# Patient Record
Sex: Female | Born: 1935
Health system: Southern US, Community
[De-identification: ages and names within clinical notes are randomized; demographics above are authoritative.]

## PROBLEM LIST (undated history)

## (undated) DIAGNOSIS — E039 Hypothyroidism, unspecified: Secondary | ICD-10-CM

## (undated) DIAGNOSIS — M1990 Unspecified osteoarthritis, unspecified site: Secondary | ICD-10-CM

## (undated) DIAGNOSIS — I209 Angina pectoris, unspecified: Secondary | ICD-10-CM

## (undated) DIAGNOSIS — E785 Hyperlipidemia, unspecified: Secondary | ICD-10-CM

## (undated) DIAGNOSIS — I2510 Atherosclerotic heart disease of native coronary artery without angina pectoris: Secondary | ICD-10-CM

## (undated) DIAGNOSIS — H409 Unspecified glaucoma: Secondary | ICD-10-CM

## (undated) HISTORY — PX: INNER EAR SURGERY: SHX679

## (undated) HISTORY — PX: CARDIAC CATHETERIZATION: SHX172

## (undated) HISTORY — PX: DENTAL SURGERY: SHX609

---

## 1969-07-30 HISTORY — PX: CATARACT EXTRACTION W/ INTRAOCULAR LENS  IMPLANT, BILATERAL: SHX1307

## 1969-07-30 HISTORY — PX: REDUCTION MAMMAPLASTY: SUR839

## 1979-07-31 HISTORY — PX: LUMBAR DISC SURGERY: SHX700

## 2003-11-26 ENCOUNTER — Encounter: Admission: RE | Admit: 2003-11-26 | Discharge: 2003-11-26 | Payer: Self-pay | Admitting: Emergency Medicine

## 2012-09-27 ENCOUNTER — Inpatient Hospital Stay (HOSPITAL_COMMUNITY)
Admission: EM | Admit: 2012-09-27 | Discharge: 2012-09-29 | DRG: 247 | Disposition: A | Discharge status: Home / Self Care | Payer: Medicare Other | Attending: Cardiology | Admitting: Cardiology

## 2012-09-27 ENCOUNTER — Encounter (HOSPITAL_COMMUNITY): Payer: Self-pay | Admitting: Emergency Medicine

## 2012-09-27 ENCOUNTER — Emergency Department (HOSPITAL_COMMUNITY): Payer: Medicare Other

## 2012-09-27 DIAGNOSIS — Z9861 Coronary angioplasty status: Secondary | ICD-10-CM

## 2012-09-27 DIAGNOSIS — E785 Hyperlipidemia, unspecified: Secondary | ICD-10-CM

## 2012-09-27 DIAGNOSIS — Z955 Presence of coronary angioplasty implant and graft: Secondary | ICD-10-CM

## 2012-09-27 DIAGNOSIS — I200 Unstable angina: Secondary | ICD-10-CM

## 2012-09-27 DIAGNOSIS — E039 Hypothyroidism, unspecified: Secondary | ICD-10-CM

## 2012-09-27 DIAGNOSIS — I10 Essential (primary) hypertension: Secondary | ICD-10-CM | POA: Diagnosis present

## 2012-09-27 DIAGNOSIS — R9431 Abnormal electrocardiogram [ECG] [EKG]: Secondary | ICD-10-CM

## 2012-09-27 DIAGNOSIS — I2510 Atherosclerotic heart disease of native coronary artery without angina pectoris: Principal | ICD-10-CM

## 2012-09-27 DIAGNOSIS — E876 Hypokalemia: Secondary | ICD-10-CM

## 2012-09-27 HISTORY — DX: Angina pectoris, unspecified: I20.9

## 2012-09-27 HISTORY — DX: Atherosclerotic heart disease of native coronary artery without angina pectoris: I25.10

## 2012-09-27 HISTORY — DX: Hypothyroidism, unspecified: E03.9

## 2012-09-27 HISTORY — DX: Unspecified glaucoma: H40.9

## 2012-09-27 HISTORY — DX: Unspecified osteoarthritis, unspecified site: M19.90

## 2012-09-27 HISTORY — DX: Hyperlipidemia, unspecified: E78.5

## 2012-09-27 LAB — CBC
HCT: 43 % (ref 36.0–46.0)
Hemoglobin: 14.8 g/dL (ref 12.0–15.0)
MCH: 30.4 pg (ref 26.0–34.0)
MCHC: 34.4 g/dL (ref 30.0–36.0)
MCV: 88.3 fL (ref 78.0–100.0)
Platelets: 310 10*3/uL (ref 150–400)
RBC: 4.87 MIL/uL (ref 3.87–5.11)
RDW: 13.2 % (ref 11.5–15.5)
WBC: 5.4 10*3/uL (ref 4.0–10.5)

## 2012-09-27 LAB — BASIC METABOLIC PANEL
BUN: 16 mg/dL (ref 6–23)
CO2: 28 mEq/L (ref 19–32)
Calcium: 9.8 mg/dL (ref 8.4–10.5)
Chloride: 101 mEq/L (ref 96–112)
Creatinine, Ser: 0.63 mg/dL (ref 0.50–1.10)
GFR calc Af Amer: >90 mL/min (ref >90)
GFR calc non Af Amer: 84 mL/min — ABNORMAL LOW (ref >90)
Glucose, Bld: 103 mg/dL — ABNORMAL HIGH (ref 70–99)
Potassium: 3.4 mEq/L — ABNORMAL LOW (ref 3.5–5.1)
Sodium: 140 mEq/L (ref 135–145)

## 2012-09-27 LAB — POCT I-STAT TROPONIN I: Troponin i, poc: 0.02 ng/mL (ref 0.00–0.08)

## 2012-09-27 LAB — APTT: aPTT: 30 seconds (ref 24–37)

## 2012-09-27 LAB — PROTIME-INR
INR: 0.96 (ref 0.00–1.49)
Prothrombin Time: 12.7 seconds (ref 11.6–15.2)

## 2012-09-27 MED ORDER — HEPARIN SODIUM (PORCINE) 5000 UNIT/ML IJ SOLN: 60.0000 [IU]/kg | Freq: Once | INTRAMUSCULAR | Status: DC

## 2012-09-27 MED ORDER — ASPIRIN 81 MG PO CHEW
CHEWABLE_TABLET | ORAL | Status: AC
  Administered 2012-09-27: 81 mg via ORAL
  Filled 2012-09-27: qty 4

## 2012-09-27 MED ORDER — HEPARIN (PORCINE) IN NACL 100-0.45 UNIT/ML-% IJ SOLN
950.0000 [IU]/h | INTRAMUSCULAR | Status: DC
  Administered 2012-09-27: 950 [IU]/h via INTRAVENOUS
  Filled 2012-09-27 (×2): qty 250

## 2012-09-27 MED ORDER — NITROGLYCERIN 0.4 MG SL SUBL
0.4000 mg | SUBLINGUAL_TABLET | SUBLINGUAL | Status: DC | PRN
  Administered 2012-09-27: 0.4 mg via SUBLINGUAL
  Filled 2012-09-27: qty 25

## 2012-09-27 MED ORDER — ASPIRIN 81 MG PO CHEW
324.0000 mg | CHEWABLE_TABLET | Freq: Once | ORAL | Status: AC
  Administered 2012-09-27: 81 mg via ORAL

## 2012-09-27 MED ORDER — HEPARIN BOLUS VIA INFUSION
4000.0000 [IU] | Freq: Once | INTRAVENOUS | Status: AC
  Administered 2012-09-27: 4000 [IU] via INTRAVENOUS

## 2012-09-27 MED ORDER — HEPARIN (PORCINE) IN NACL 100-0.45 UNIT/ML-% IJ SOLN: 1000.0000 [IU]/h | Freq: Once | INTRAMUSCULAR | Status: DC

## 2012-09-27 MED ORDER — ASPIRIN 325 MG PO TABS: 325.0000 mg | ORAL_TABLET | ORAL | Status: DC

## 2012-09-27 NOTE — ED Provider Notes (Signed)
History    77 year old female with chest pain. Intermittent for the past month. Substernal. Starts as sharp pain that transitions into a dull ache and slowly subsides.  Symptoms last anywhere from a few minutes to up to approximately 20 minutes. Sometimes associated with nausea and SOB. Sometimes radiation to her arms and her neck. Episodes becoming more frequent. Episode just prior to arrival lasted much longer than it has previously which is why she came to the ED.  Received nitro and ASA shortly after arrival and symptoms completely resolved prior to my exam. Patient states history of similar symptoms approximately 20 years ago and she subsequently had coronary stenting. Care was in [place] and she does not have a local cardiologist. No fevers or chills. No cough. No unusual leg pain or swelling. No smoking hx. Hx of HTN and HLD.  CSN: [id number]  Arrival date & time 09/27/12  1839   First MD Initiated Contact with Patient 09/27/12 2041      Chief Complaint  Patient presents with  . Chest Pain  . Numbness    arms    (Consider location/radiation/quality/duration/timing/severity/associated sxs/prior treatment) HPI  Past Medical History  Diagnosis Date  . Thyroid disease   . Glaucoma     Past Surgical History  Procedure Date  . Coronary angioplasty with stent placement     No family history on file.  History  Substance Use Topics  . Smoking status: Never Smoker   . Smokeless tobacco: Not on file  . Alcohol Use: No    OB History    Grav Para Term Preterm Abortions TAB SAB Ect Mult Living                  Review of Systems   Review of symptoms negative unless otherwise noted in HPI.   Allergies  Morphine and related and Penicillins  Home Medications   Current Outpatient Rx  Name Route Sig Dispense Refill  . DORZOLAMIDE HCL-TIMOLOL MAL 22.3-6.8 MG/ML OP SOLN Both Eyes Place 1 drop into both eyes 2 (two) times daily.    Marland Kitchen LEVOTHYROXINE SODIUM 125 MCG PO  TABS Oral Take 125 mcg by mouth daily.    Marland Kitchen PRAVASTATIN SODIUM 40 MG PO TABS Oral Take 40 mg by mouth daily.      BP 149/48  Pulse 80  Temp 97.6 F (36.4 C) (Oral)  Resp 14  Ht 5\' 4"  (1.626 m)  Wt 179 lb (81.194 kg)  BMI 30.73 kg/m2  SpO2 95%  Physical Exam  Nursing note and vitals reviewed. Constitutional: She appears well-developed and well-nourished. No distress.       Sitting up in bed. NAD.  HENT:  Head: Normocephalic and atraumatic.  Eyes: Conjunctivae normal are normal. Right eye exhibits no discharge. Left eye exhibits no discharge.  Neck: Neck supple.  Cardiovascular: Normal rate, regular rhythm and normal heart sounds.  Exam reveals no gallop and no friction rub.   No murmur heard. Pulmonary/Chest: Effort normal and breath sounds normal. No respiratory distress. She exhibits no tenderness.       CP not reproducible  Abdominal: Soft. She exhibits no distension. There is no tenderness.  Musculoskeletal: She exhibits no edema and no tenderness.       Lower extremities symmetric as compared to each other. No calf tenderness. Negative Homan's. No palpable cords.   Neurological: She is alert.  Skin: Skin is warm and dry. She is not diaphoretic.  Psychiatric: She has a normal mood and affect. Her behavior  is normal. Thought content normal.    ED Course  Procedures (including critical care time)  CRITICAL CARE Performed by: Raeford Razor   Total critical care time: 30 minutes  Critical care time was exclusive of separately billable procedures and treating other patients.  Critical care was necessary to treat or prevent imminent or life-threatening deterioration.  Critical care was time spent personally by me on the following activities: development of treatment plan with patient and/or surrogate as well as nursing, discussions with consultants, evaluation of patient's response to treatment, examination of patient, obtaining history from patient or surrogate,  ordering and performing treatments and interventions, ordering and review of laboratory studies, ordering and review of radiographic studies, pulse oximetry and re-evaluation of patient's condition.   Labs Reviewed  BASIC METABOLIC PANEL - Abnormal; Notable for the following:    Potassium 3.4 (*)     Glucose, Bld 103 (*)     GFR calc non Af Amer 84 (*)     All other components within normal limits  CBC  POCT I-STAT TROPONIN I   Dg Chest 2 View  09/27/2012  *RADIOLOGY REPORT*  Clinical Data: Chest pain.  Weakness.  CHEST - 2 VIEW  Comparison: None.  Findings: Artifact overlies the chest.  Heart size is normal. Mediastinal shadows are normal.  The lungs are clear.  No effusions.  No bony abnormalities.  IMPRESSION: No active disease.   Original Report Authenticated By: Thomasenia Sales, M.D.    EKG:  Rhythm: normal sinus. artifact I ,III, aVL Vent. rate 82 BPM PR interval 148 ms QRS duration 80 ms QT/QTc 372/434 ms Axis: normal ST segments: ST depression V3-V6 and inferior leads Comparison: none  EKG: repeat Rhythm: normal sinus Vent. rate 72 BPM PR interval 160 ms QRS duration 76 ms QT/QTc 436/477 ms Axis: normal ST segments: TWI lateral precordial leads. t wave flattening laterally Comparison: interval resolution of ST depression   1. Unstable angina   2. Abnormal EKG       MDM  78 year old female with chest pain.  EKG with dynamic changes. Initial EKG on presentation with some ST depression laterally. Repeat EKG shows resolution of this and now T wave inversions in the lateral precordial leads. Patient has a known history of CAD status post stenting proximally 20 years ago. Presentation concerning for unstable angina. Received ASA.  Heparin gtt. Cardiology was consulted.        Raeford Razor, MD 09/27/12 2329

## 2012-09-27 NOTE — ED Notes (Signed)
Pt states that she has had chest pain x 1 month.  Has gotten worse.  Also c/o numb arms.  States that when her chest hurts, it is a sharp pain, she has SOB and dizziness.  Pt a&o x 4. BP 212/67 in triage.

## 2012-09-27 NOTE — H&P (Signed)
Cardiology History and Physical  No primary provider on file.  History of Present Illness (and review of medical records): Sabrina Hart is a 78 y.o. female who presents for evaluation of chest pain.  She does not have a cardiologist locally, but does have hx of CAD with previous PCI 15-70yrs ago in [place].  She has been here in [place] for over 42yrs.  She reports for past month she has noticed chest pain with minimal exertion.  Chest pain is midsternal and associated with numbness in arms bilaterally, neck tightness, shortness of breath, diaphoresis and nausea.  Symptoms have not necessarily progressed overtime, however, today she had severe episode that was not relieved with rest.  She was brought to ED by family and pain was resolved after ASA and Nitro.  She was started on Heparin gtt.  She remains chest pain free at the time of my evaluation.  Previous diagnostic testing for coronary artery disease includes: cardiac catheterization. Previous history of cardiac disease includes Coronary Artery Disease Coronary Artery Stent. Coronary artery disease risk factors include: advanced age (older than 62 for men, 26 for women) and dyslipidemia. Patient denies history of CABG and cardiomyopathy.  Review of Systems Further review of systems was otherwise negative other than stated in HPI.  Patient Active Problem List   Diagnosis Date Noted  . Unstable angina 09/27/2012   Past Medical History  Diagnosis Date  . Thyroid disease   . Glaucoma     Past Surgical History  Procedure Date  . Coronary angioplasty with stent placement      (Not in a hospital admission) Allergies  Allergen Reactions  . Morphine And Related Rash  . Penicillins Rash    History  Substance Use Topics  . Smoking status: Never Smoker   . Smokeless tobacco: Not on file  . Alcohol Use: No    No family history on file.   Objective: Patient Vitals for the past 8 hrs:  BP Temp Temp src Pulse Resp SpO2 Height Weight    09/27/12 2156 - - - - - - 5\' 4"  (1.626 m) 81.194 kg (179 lb)  09/27/12 2130 149/48 mmHg - - - 14  - - -  09/27/12 2100 160/54 mmHg - - 80  18  95 % - -  09/27/12 2030 141/57 mmHg - - 67  14  96 % - -  09/27/12 1926 159/61 mmHg - - 70  - 100 % - -  09/27/12 1905 190/64 mmHg - - 90  14  99 % - -  09/27/12 1857 - 97.6 F (36.4 C) Oral - - - - -  09/27/12 1852 212/67 mmHg - - 72  - 100 % - -   General Appearance:    Alert, cooperative, no distress, appears stated age  Head:    Normocephalic, without obvious abnormality, atraumatic  Eyes:     PERRL, EOMI, anicteric sclerae  Neck:   Supple, no carotid bruit or JVD  Lungs:     Clear to auscultation bilaterally, respirations unlabored  Heart:    Regular rate and rhythm, S1 and S2 normal, no murmur  Abdomen:     Soft, non-tender, normoactive bowel sounds  Extremities:   Extremities normal, atraumatic, BLE edema  Pulses:   2+ and symmetric all extremities  Skin:   no rashes or lesions  Neurologic:   No focal deficits. AAO x3   Results for orders placed during the hospital encounter of 09/27/12 (from the past 48 hour(s))  CBC  Status: Normal   Collection Time   09/27/12  7:26 PM      Component Value Range Comment   WBC 5.4  4.0 - 10.5 K/uL    RBC 4.87  3.87 - 5.11 MIL/uL    Hemoglobin 14.8  12.0 - 15.0 g/dL    HCT 16.1  09.6 - 04.5 %    MCV 88.3  78.0 - 100.0 fL    MCH 30.4  26.0 - 34.0 pg    MCHC 34.4  30.0 - 36.0 g/dL    RDW 40.9  81.1 - 91.4 %    Platelets 310  150 - 400 K/uL   BASIC METABOLIC PANEL     Status: Abnormal   Collection Time   09/27/12  7:26 PM      Component Value Range Comment   Sodium 140  135 - 145 mEq/L    Potassium 3.4 (*) 3.5 - 5.1 mEq/L    Chloride 101  96 - 112 mEq/L    CO2 28  19 - 32 mEq/L    Glucose, Bld 103 (*) 70 - 99 mg/dL    BUN 16  6 - 23 mg/dL    Creatinine, Ser 7.82  0.50 - 1.10 mg/dL    Calcium 9.8  8.4 - 95.6 mg/dL    GFR calc non Af Amer 84 (*) >90 mL/min    GFR calc Af Amer >90  >90  mL/min   POCT I-STAT TROPONIN I     Status: Normal   Collection Time   09/27/12  7:31 PM      Component Value Range Comment   Troponin i, poc 0.02  0.00 - 0.08 ng/mL    Comment 3             Dg Chest 2 View  09/27/2012  *RADIOLOGY REPORT*  Clinical Data: Chest pain.  Weakness.  CHEST - 2 VIEW  Comparison: None.  Findings: Artifact overlies the chest.  Heart size is normal. Mediastinal shadows are normal.  The lungs are clear.  No effusions.  No bony abnormalities.  IMPRESSION: No active disease.   Original Report Authenticated By: Thomasenia Sales, M.D.     ECG:  Initial ecg with baseline artifact, sinus rhythm HR 82, diffuse ST depression anterolateral and inferior, mild elevation in AVR, repeat much improved with TWI v4-v6. None prior to compare.  Assessment: 19F with hx of CAD s/p remote PCI to unknown vessel, HLD presents with symptoms concerning for ACS/unstable angina.  Plan:  1. Admit to Cardiology.  Will transfer from [facility] to stepdown at [facility]. 2. Continuous monitoring on Telemetry. 3. Repeat ekg on admit, prn chest pain or arrythmia 4. Trend cardiac biomarkers, check lipids, hgba1c, tsh, bnp 5. Medical management to include ASA, Heparin, BB, Statin, NTG prn 6. Hold Plavix due to concern for possible multivessel disease.

## 2012-09-27 NOTE — Progress Notes (Signed)
ANTICOAGULATION CONSULT NOTE - Initial Consult  Pharmacy Consult for Heparin Indication: chest pain/ACS  Allergies  Allergen Reactions  . Morphine And Related Rash  . Penicillins Rash    Patient Measurements: Height: 5\' 4"  (162.6 cm) Weight: 179 lb (81.194 kg) IBW/kg (Calculated) : 54.7  Heparin Dosing Weight:   Vital Signs: Temp: 97.6 F (36.4 C) (10/30 1857) Temp src: Oral (10/30 1857) BP: 149/48 mmHg (10/30 2130) Pulse Rate: 80  (10/30 2100)  Labs:  Basename 09/27/12 1926  HGB 14.8  HCT 43.0  PLT 310  APTT --  LABPROT --  INR --  HEPARINUNFRC --  CREATININE 0.63  CKTOTAL --  CKMB --  TROPONINI --    Estimated Creatinine Clearance: 59.7 ml/min (by C-G formula based on Cr of 0.63).   Medical History: Past Medical History  Diagnosis Date  . Thyroid disease   . Glaucoma     Medications:  Scheduled:    . aspirin  324 mg Oral Once  . heparin  1,000 Units/hr Intravenous Once  . heparin  60 Units/kg Intravenous Once  . DISCONTD: aspirin  325 mg Oral STAT   Infusions:    Assessment:  80 YOF to ED on 10/30 with c/o chest pain.  Pt has hx of CAD s/p stenting.  Baseline coag labs pending  CBC wnl  First troponin 0.02  Goal of Therapy:  Heparin level 0.3-0.7 units/ml Monitor platelets by anticoagulation protocol: Yes   Plan:   Baseline PTT, PT/INR  Give heparin 4000 units bolus IV x 1  Start heparin IV infusion at 950 units/hr  Heparin level 8 hours after starting  Daily heparin level and CBC  Continue to monitor H&H and platelets  Lynann Beaver PharmD, BCPS Pager [phone] 09/27/2012 10:14 PM

## 2012-09-27 NOTE — ED Notes (Signed)
MD at bedside. 

## 2012-09-27 NOTE — ED Notes (Signed)
Cardiology MD at bedside.

## 2012-09-28 ENCOUNTER — Encounter (HOSPITAL_COMMUNITY)
Admission: EM | Disposition: A | Discharge status: Home / Self Care | Payer: Self-pay | Source: Home / Self Care | Attending: Cardiology

## 2012-09-28 ENCOUNTER — Encounter (HOSPITAL_COMMUNITY): Payer: Self-pay | Admitting: *Deleted

## 2012-09-28 DIAGNOSIS — I2510 Atherosclerotic heart disease of native coronary artery without angina pectoris: Secondary | ICD-10-CM

## 2012-09-28 HISTORY — PX: PERCUTANEOUS CORONARY STENT INTERVENTION (PCI-S): SHX5485

## 2012-09-28 HISTORY — PX: LEFT HEART CATHETERIZATION WITH CORONARY ANGIOGRAM: SHX5451

## 2012-09-28 HISTORY — PX: CORONARY ANGIOPLASTY WITH STENT PLACEMENT: SHX49

## 2012-09-28 LAB — LIPID PANEL
Cholesterol: 179 mg/dL (ref 0–200)
HDL: 68 mg/dL (ref >39)
LDL Cholesterol: 99 mg/dL (ref 0–99)
Total CHOL/HDL Ratio: 2.6 RATIO
Triglycerides: 62 mg/dL (ref <150)
VLDL: 12 mg/dL (ref 0–40)

## 2012-09-28 LAB — COMPREHENSIVE METABOLIC PANEL
ALT: 9 U/L (ref 0–35)
Alkaline Phosphatase: 71 U/L (ref 39–117)
CO2: 24 mEq/L (ref 19–32)
GFR calc Af Amer: >90 mL/min (ref >90)
GFR calc non Af Amer: 87 mL/min — ABNORMAL LOW (ref >90)
Glucose, Bld: 90 mg/dL (ref 70–99)
Potassium: 4.2 mEq/L (ref 3.5–5.1)
Sodium: 138 mEq/L (ref 135–145)
Total Protein: 6.1 g/dL (ref 6.0–8.3)

## 2012-09-28 LAB — MRSA PCR SCREENING: MRSA by PCR: NEGATIVE

## 2012-09-28 LAB — CBC
HCT: 35.9 % — ABNORMAL LOW (ref 36.0–46.0)
Hemoglobin: 12.2 g/dL (ref 12.0–15.0)
MCH: 30.3 pg (ref 26.0–34.0)
MCHC: 34 g/dL (ref 30.0–36.0)
MCV: 89.1 fL (ref 78.0–100.0)
Platelets: 223 10*3/uL (ref 150–400)
RBC: 4.03 MIL/uL (ref 3.87–5.11)
RDW: 13.3 % (ref 11.5–15.5)
WBC: 4.3 10*3/uL (ref 4.0–10.5)

## 2012-09-28 LAB — CK TOTAL AND CKMB (NOT AT ARMC)
CK, MB: 2.5 ng/mL (ref 0.3–4.0)
CK, MB: 2.4 ng/mL (ref 0.3–4.0)
Relative Index: 1.5 (ref 0.0–2.5)
Relative Index: 1.3 (ref 0.0–2.5)

## 2012-09-28 LAB — BASIC METABOLIC PANEL
BUN: 15 mg/dL (ref 6–23)
CO2: 25 mEq/L (ref 19–32)
Calcium: 8.9 mg/dL (ref 8.4–10.5)
Chloride: 106 mEq/L (ref 96–112)
Creatinine, Ser: 0.68 mg/dL (ref 0.50–1.10)
GFR calc Af Amer: >90 mL/min (ref >90)
GFR calc non Af Amer: 82 mL/min — ABNORMAL LOW (ref >90)
Glucose, Bld: 94 mg/dL (ref 70–99)
Potassium: 3.4 mEq/L — ABNORMAL LOW (ref 3.5–5.1)
Sodium: 141 mEq/L (ref 135–145)

## 2012-09-28 LAB — HEMOGLOBIN A1C
Hgb A1c MFr Bld: 5.6 % (ref <5.7)
Mean Plasma Glucose: 114 mg/dL (ref <117)

## 2012-09-28 LAB — POCT ACTIVATED CLOTTING TIME: Activated Clotting Time: 404 seconds

## 2012-09-28 LAB — PRO B NATRIURETIC PEPTIDE: Pro B Natriuretic peptide (BNP): 242.8 pg/mL (ref 0–450)

## 2012-09-28 LAB — PROTIME-INR
INR: 1.11 (ref 0.00–1.49)
Prothrombin Time: 14.2 seconds (ref 11.6–15.2)

## 2012-09-28 LAB — TROPONIN I: Troponin I: <0.3 ng/mL (ref <0.30)

## 2012-09-28 SURGERY — LEFT HEART CATHETERIZATION WITH CORONARY ANGIOGRAM
Anesthesia: LOCAL

## 2012-09-28 MED ORDER — METOPROLOL TARTRATE 1 MG/ML IV SOLN: 2.5000 mg | INTRAVENOUS | Status: DC | PRN

## 2012-09-28 MED ORDER — METOPROLOL TARTRATE 25 MG PO TABS
25.0000 mg | ORAL_TABLET | Freq: Two times a day (BID) | ORAL | Status: DC
  Administered 2012-09-28 (×2): 25 mg via ORAL
  Filled 2012-09-28 (×4): qty 1

## 2012-09-28 MED ORDER — MIDAZOLAM HCL 2 MG/2ML IJ SOLN
INTRAMUSCULAR | Status: AC
  Filled 2012-09-28: qty 2

## 2012-09-28 MED ORDER — CEFAZOLIN SODIUM 1-5 GM-% IV SOLN
INTRAVENOUS | Status: AC
  Filled 2012-09-28: qty 50

## 2012-09-28 MED ORDER — FENTANYL CITRATE 0.05 MG/ML IJ SOLN
INTRAMUSCULAR | Status: AC
  Filled 2012-09-28: qty 2

## 2012-09-28 MED ORDER — ONDANSETRON HCL 4 MG/2ML IJ SOLN: 4.0000 mg | Freq: Four times a day (QID) | INTRAMUSCULAR | Status: DC | PRN

## 2012-09-28 MED ORDER — LIDOCAINE HCL (PF) 1 % IJ SOLN
INTRAMUSCULAR | Status: AC
  Filled 2012-09-28: qty 30

## 2012-09-28 MED ORDER — NON FORMULARY: 90.0000 mg | Freq: Once | Status: DC

## 2012-09-28 MED ORDER — ASPIRIN EC 81 MG PO TBEC: 81.0000 mg | DELAYED_RELEASE_TABLET | Freq: Every day | ORAL | Status: DC

## 2012-09-28 MED ORDER — ACETAMINOPHEN 325 MG PO TABS: 650.0000 mg | ORAL_TABLET | ORAL | Status: DC | PRN

## 2012-09-28 MED ORDER — HEPARIN (PORCINE) IN NACL 2-0.9 UNIT/ML-% IJ SOLN
INTRAMUSCULAR | Status: AC
  Filled 2012-09-28: qty 1000

## 2012-09-28 MED ORDER — LIDOCAINE-EPINEPHRINE 1 %-1:100000 IJ SOLN
INTRAMUSCULAR | Status: AC
  Filled 2012-09-28: qty 1

## 2012-09-28 MED ORDER — DORZOLAMIDE HCL-TIMOLOL MAL 2-0.5 % OP SOLN
1.0000 [drp] | Freq: Two times a day (BID) | OPHTHALMIC | Status: DC
  Administered 2012-09-28 – 2012-09-29 (×3): 1 [drp] via OPHTHALMIC
  Filled 2012-09-28: qty 10

## 2012-09-28 MED ORDER — NITROGLYCERIN 0.2 MG/ML ON CALL CATH LAB
INTRAVENOUS | Status: AC
  Filled 2012-09-28: qty 1

## 2012-09-28 MED ORDER — ASPIRIN 81 MG PO CHEW
324.0000 mg | CHEWABLE_TABLET | ORAL | Status: DC
  Filled 2012-09-28: qty 4

## 2012-09-28 MED ORDER — SODIUM CHLORIDE 0.9 % IV SOLN: INTRAVENOUS | Status: AC

## 2012-09-28 MED ORDER — ASPIRIN 300 MG RE SUPP
300.0000 mg | RECTAL | Status: DC
  Filled 2012-09-28: qty 1

## 2012-09-28 MED ORDER — POTASSIUM CHLORIDE CRYS ER 20 MEQ PO TBCR
40.0000 meq | EXTENDED_RELEASE_TABLET | Freq: Once | ORAL | Status: AC
  Administered 2012-09-28: 40 meq via ORAL
  Filled 2012-09-28: qty 2

## 2012-09-28 MED ORDER — HEPARIN SODIUM (PORCINE) 1000 UNIT/ML IJ SOLN
INTRAMUSCULAR | Status: AC
  Filled 2012-09-28: qty 1

## 2012-09-28 MED ORDER — LEVOTHYROXINE SODIUM 125 MCG PO TABS
125.0000 ug | ORAL_TABLET | Freq: Every day | ORAL | Status: DC
  Administered 2012-09-28 – 2012-09-29 (×2): 125 ug via ORAL
  Filled 2012-09-28 (×3): qty 1

## 2012-09-28 MED ORDER — SODIUM CHLORIDE 0.9 % IV SOLN: 250.0000 mL | INTRAVENOUS | Status: DC

## 2012-09-28 MED ORDER — SIMVASTATIN 40 MG PO TABS
40.0000 mg | ORAL_TABLET | Freq: Every day | ORAL | Status: DC
  Administered 2012-09-28: 40 mg via ORAL
  Filled 2012-09-28 (×2): qty 1

## 2012-09-28 MED ORDER — SODIUM CHLORIDE 0.9 % IJ SOLN: 3.0000 mL | Freq: Two times a day (BID) | INTRAMUSCULAR | Status: DC

## 2012-09-28 MED ORDER — SODIUM CHLORIDE 0.9 % IJ SOLN: 3.0000 mL | INTRAMUSCULAR | Status: DC | PRN

## 2012-09-28 MED ORDER — TICAGRELOR 90 MG PO TABS
90.0000 mg | ORAL_TABLET | Freq: Two times a day (BID) | ORAL | Status: DC
  Filled 2012-09-28 (×2): qty 1

## 2012-09-28 MED ORDER — BIOTENE DRY MOUTH MT LIQD
15.0000 mL | Freq: Two times a day (BID) | OROMUCOSAL | Status: DC
  Administered 2012-09-28 – 2012-09-29 (×2): 15 mL via OROMUCOSAL

## 2012-09-28 MED ORDER — ASPIRIN 81 MG PO CHEW: 81.0000 mg | CHEWABLE_TABLET | Freq: Every day | ORAL | Status: DC

## 2012-09-28 MED ORDER — TICAGRELOR 90 MG PO TABS
90.0000 mg | ORAL_TABLET | Freq: Once | ORAL | Status: AC
  Administered 2012-09-29: 90 mg via ORAL
  Filled 2012-09-28: qty 1

## 2012-09-28 MED ORDER — DIAZEPAM 5 MG PO TABS
5.0000 mg | ORAL_TABLET | ORAL | Status: AC
  Administered 2012-09-28: 5 mg via ORAL
  Filled 2012-09-28: qty 1

## 2012-09-28 MED ORDER — BIVALIRUDIN 250 MG IV SOLR
INTRAVENOUS | Status: AC
  Filled 2012-09-28: qty 250

## 2012-09-28 MED ORDER — SODIUM CHLORIDE 0.9 % IV SOLN
1.0000 mL/kg/h | INTRAVENOUS | Status: DC
  Administered 2012-09-28: 07:00:00 1 mL/kg/h via INTRAVENOUS

## 2012-09-28 MED ORDER — SODIUM CHLORIDE 0.9 % IV SOLN: 1.0000 mL/kg/h | INTRAVENOUS | Status: AC

## 2012-09-28 NOTE — CV Procedure (Signed)
    Cardiac Cath Note  Sabrina Hart [id number] 08/10/1932  Procedure: left  Heart Cardiac Catheterization Note Indications: Chest pain  Procedure Details Consent: Obtained Time Out: Verified patient identification, verified procedure, site/side was marked, verified correct patient position, special equipment/implants available, Radiology Safety Procedures followed,  medications/allergies/relevent history reviewed, required imaging and test results available.  Performed   Medications: Fentanyl: 50 mcg IV Versed: 2 mg IV  The right femoral artery was easily canulated using a modified Seldinger technique.  Hemodynamics:   LV pressure: 118/12 Aortic pressure: 115/55  Angiography   Left Main:  JL3.5 used to canulate the LM.  Small and normal  Left anterior Descending:  Moderate diffuse disease.  20-30% stenosis throughout.  The 1st diag is a large vessel with mild - moderate diffuse disease.  There is a focal 70% stenosis in the distal 1st diag.  Left Circumflex: LCx - moderate size vessel with mild diffuse disease.  1st OM is moderate and has a focal 50-60% stenosis  Right Coronary Artery:  Large and dominant , diffuse disease.  There is a mid 60% stenosis and a mid/distal 90% stenosis.  The PDA and PLSA have mild irregularities.  LV Gram: hyperdynamic LV systolic function.  EF 75-80%  Complications: No apparent complications Patient did tolerate procedure well.  Contrast used:  70CC  Conclusions:   1. Diffuse CAD with a tight stenosis in the mid/distal RCA.   We will proceed with stenting later today.  I have reviewed the angiograms with Dr. Excell Seltzer.  2. Hyperdynamic LV function   [name], [name]., MD, [facility] 09/28/2012, 12:07 PM Office - [phone] Pager [phone]

## 2012-09-28 NOTE — Research (Signed)
[facility] Ad Hoc PCI Research Study Informed Consent   Subject Name: Sabrina Hart  Subject met inclusion and exclusion criteria.  The informed consent form, study requirements and expectations were reviewed with the subject and questions and concerns were addressed prior to the signing of the consent form.  The subject verbalized understanding of the trial requirements.  The subject agreed to participate in the [facility] Ad Hoc PCI Research Study and signed the informed consent.  The informed consent was obtained prior to performance of any protocol-specific procedures for the subject.  A copy of the signed informed consent was given to the subject and a copy was placed in the subject's medical record.  Claire Shown 09/28/2012, 4:11 PM

## 2012-09-28 NOTE — Interval H&P Note (Signed)
History and Physical Interval Note:  09/28/2012 2:53 PM  Sabrina Hart  has presented today for surgery, with the diagnosis of cp  The various methods of treatment have been discussed with the patient and family. After consideration of risks, benefits and other options for treatment, the patient has consented to  Procedure(s) (LRB) with comments: LEFT HEART CATHETERIZATION WITH CORONARY ANGIOGRAM (N/A) PERCUTANEOUS CORONARY STENT INTERVENTION (PCI-S) () as a surgical intervention .  The patient's history has been reviewed, patient examined, no change in status, stable for surgery.  I have reviewed the patient's chart and labs.  Questions were answered to the patient's satisfaction.    Diagnostic cath shows severe RCA stenosis. Plan PCI today. Pt enrolled to ad-hoc brilinta PCI study.   Tonny Bollman

## 2012-09-28 NOTE — Interval H&P Note (Signed)
History and Physical Interval Note:  09/28/2012 11:30 AM  Sabrina Hart  has presented today for surgery, with the diagnosis of cp  The various methods of treatment have been discussed with the patient and family. After consideration of risks, benefits and other options for treatment, the patient has consented to  Procedure(s) (LRB) with comments: LEFT HEART CATHETERIZATION WITH CORONARY ANGIOGRAM (N/A) as a surgical intervention .  The patient's history has been reviewed, patient examined, no change in status, stable for surgery.  I have reviewed the patient's chart and labs.  Questions were answered to the patient's satisfaction.     Elyn Aquas.

## 2012-09-28 NOTE — Progress Notes (Signed)
ANTICOAGULATION CONSULT NOTE - Follow Up Consult  Pharmacy Consult for Heparin Indication: chest pain/ACS  Allergies  Allergen Reactions  . Latex   . Other     metals  . Pollen Extract   . Morphine And Related Rash  . Penicillins Rash    Patient Measurements: Height: 5\' 4"  (162.6 cm) Weight: 174 lb 2.6 oz (79 kg) IBW/kg (Calculated) : 54.7  Heparin Dosing Weight: 71.56 kg  Vital Signs: Temp: 97.8 F (36.6 C) (10/31 0530) Temp src: Oral (10/31 0141) BP: 147/60 mmHg (10/31 0530) Pulse Rate: 63  (10/31 0100)  Labs:  Basename 09/28/12 0836 09/28/12 0555 09/28/12 0010 09/27/12 2217 09/27/12 1926  HGB -- 12.2 -- -- 14.8  HCT -- 35.9* -- -- 43.0  PLT -- 223 -- -- 310  APTT -- -- -- 30 --  LABPROT -- 14.2 -- 12.7 --  INR -- 1.11 -- 0.96 --  HEPARINUNFRC 0.57 -- -- -- --  CREATININE -- 0.68 -- -- 0.63  CKTOTAL -- -- -- -- --  CKMB -- -- -- -- --  TROPONINI -- <0.30 <0.30 -- --    Estimated Creatinine Clearance: 58.9 ml/min (by C-G formula based on Cr of 0.68).   Medications:  Infusions:    . sodium chloride 75 mL/hr at 09/28/12 0200  . sodium chloride    . heparin 950 Units/hr (09/28/12 0500)    Assessment: 78 y.o. F transferred from [facility] to [facility] for further evaluation of CP. Patient remains on heparin while awaiting cardiac cath (add-on for today). Heparin level this morning is therapeutic (HL 0.57, goal of 0.3-0.7). Hgb/Hct/Plt slight drop -- no s/sx of bleeding noted at this time.  Goal of Therapy:  Heparin level 0.3-0.7 units/ml Monitor platelets by anticoagulation protocol: Yes   Plan:  1. Continue heparin at 950 units/hr (9.5 ml/hr) 2. Will continue to monitor for any signs/symptoms of bleeding and will follow up with heparin level in 8 hours or post-cath  Georgina Pillion, PharmD, BCPS Clinical Pharmacist Pager: [phone] 09/28/2012 10:35 AM

## 2012-09-28 NOTE — Progress Notes (Signed)
Cardiology Progress Note Patient Name: Sabrina Hart Date of Encounter: 09/28/2012, 8:04 AM     Subjective  No overnight events. Denies chest pain or sob.   Objective   Telemetry: sinus rhythm 70s-80s  Medications: . antiseptic oral rinse  15 mL Mouth Rinse BID  . aspirin  324 mg Oral Once  . aspirin  324 mg Oral NOW   Or  . aspirin  300 mg Rectal NOW  . aspirin EC  81 mg Oral Daily  . dorzolamide-timolol  1 drop Both Eyes BID  . heparin  4,000 Units Intravenous Once  . levothyroxine  125 mcg Oral QAC breakfast  . metoprolol tartrate  25 mg Oral BID  . simvastatin  40 mg Oral q1800  . DISCONTD: aspirin  325 mg Oral STAT  . DISCONTD: heparin  1,000 Units/hr Intravenous Once  . DISCONTD: heparin  60 Units/kg Intravenous Once   . sodium chloride 75 mL/hr at 09/28/12 0200  . heparin 950 Units/hr (09/28/12 0500)    Physical Exam: Temp:  [97.6 F (36.4 C)-98.9 F (37.2 C)] 97.8 F (36.6 C) (10/31 0530) Pulse Rate:  [63-90] 63  (10/31 0100) Resp:  [11-21] 11  (10/31 0141) BP: (120-212)/(48-92) 147/60 mmHg (10/31 0530) SpO2:  [95 %-100 %] 100 % (10/31 0141) Weight:  [174 lb 2.6 oz (79 kg)-179 lb (81.194 kg)] 174 lb 2.6 oz (79 kg) (10/31 0141)  General: Pleasant elderly black female, in no acute distress. Head: Normocephalic, atraumatic, sclera non-icteric, nares are without discharge.  Neck: Supple. Negative for carotid bruits or JVD Lungs: Clear bilaterally to auscultation without wheezes, rales, or rhonchi. Breathing is unlabored. Heart: RRR S1 S2 without murmurs, rubs, or gallops.  Abdomen: Soft, non-tender, non-distended with normoactive bowel sounds. No rebound/guarding. No obvious abdominal masses. Msk:  Strength and tone appear normal for age. Extremities: 1+ BLE edema. No clubbing or cyanosis. Distal pedal pulses are intact and equal bilaterally. Neuro: Alert and oriented X 3. Moves all extremities spontaneously. Psych:  Responds to questions appropriately with  a normal affect.   Intake/Output Summary (Last 24 hours) at 09/28/12 0804 Last data filed at 09/28/12 0500  Gross per 24 hour  Intake    338 ml  Output    550 ml  Net   -212 ml    Labs:  [facility] 09/28/12 0555 09/27/12 1926  NA 141 140  K 3.4* 3.4*  CL 106 101  CO2 25 28  GLUCOSE 94 103*  BUN 15 16  CREATININE 0.68 0.63  CALCIUM 8.9 9.8   Basename 09/28/12 0555 09/27/12 1926  WBC 4.3 5.4  HGB 12.2 14.8  HCT 35.9* 43.0  MCV 89.1 88.3  PLT 223 310   Basename 09/28/12 0010  TROPONINI <0.30     09/28/2012 05:55  Prothrombin Time 14.2  INR 1.11     09/28/2012 05:55  Pro B Natriuretic peptide (BNP) 242.8    09/28/2012 05:55  Cholesterol 179  Triglycerides 62  HDL 68  LDL (calc) 99  VLDL 12  Total CHOL/HDL Ratio 2.6    Radiology/Studies:   09/27/2012 -  CHEST - 2 VIEW  Findings: Artifact overlies the chest.  Heart size is normal. Mediastinal shadows are normal.  The lungs are clear.  No effusions.  No bony abnormalities.  IMPRESSION: No active disease.       Assessment and Plan  37F with hx of CAD s/p remote PCI to unknown vessel and HLD presents with symptoms concerning for  ACS/unstable angina.  1. Chest pain concerning for unstable angina 2. Elevated blood pressure 3. Hypokalemia 4. Hyperlipidemia 5. Hypothyroidism  Patient has history of CAD s/p PCI ~15-61yrs ago. Presents with one month of exertional squeezing chest pain that worsened yesterday and was relieved with NTG. She has had occasional rest pain and states it feels similar to the pain she had prior to her stent. Her symptoms are concerning for unstable angina. Troponin normal x1. EKG with inferolateral TWI (no old for comparison). No further chest pain. Cont IV heparin, ASA, BB, statin. Discussed with Dr. Gala Romney who agrees she needs further ischemic evaluation with cath. Discussed risk and benefits with the patient who is agreeable to the procedure. No prior history of hypertension, not on  antihypertensives prior to arrival. BP elevated 140-160s. Suppplement K+. A1c pending. LDL 99. Cont statin and consider increasing dose pending cath results. CMET in am to assess LFTs, Crt and K+. TSH pending. Cont synthroid.    Signed, Kalen Ratajczak PA-C

## 2012-09-28 NOTE — CV Procedure (Signed)
   CARDIAC CATH NOTE  Name: Sabrina Hart MRN: [id number] DOB: 08-18-1932  Procedure: PTCA and stenting of the mid-RCA, Mynx closure device - RFA  Indication: Unstable angina. 78 year-old woman underwent diagnostic cath earlier today showing severe 90% mid-RCA stenosis and clinical presentation with USAP. Plan PCI.  Procedural Details: The right groin was prepped, draped, and anesthetized with 1% lidocaine. The femoral sheath was upsized to 6 Fr using sterile technique.  Weight-based bivalirudin was given for anticoagulation. The patient was randomized to the ad hoc brilinta PCI trial and she received either Plavix 600 mg or brilinta 180 mg which we were blinded to. Once a therapeutic ACT was achieved, a 6 [place] JR-4 guide catheter was inserted.  A BMW coronary guidewire was used to cross the lesion.  The lesion was predilated with a 2.5x15 mm balloon.  The lesion was then stented with a 2.75x35mm Promus Element drug-eluting stent deployed at 14 atmospheres.  There was a good step up into the stent and the stent down off the distal end. The stent appeared well expanded so I did not post dilate. Following PCI, there was 0% residual stenosis and TIMI-3 flow. Final angiography confirmed an excellent result. The patient tolerated the procedure well. There were no immediate procedural complications. Femoral hemostasis was achieved with Mynx device. Ancef 1 g IV was administered. The patient was transferred to the post catheterization recovery area for further monitoring.  Lesion Data: Vessel: RCA/mid Percent stenosis (pre): 90 TIMI-flow (pre):  3 Stent:  2.75 x 28 mm drug-eluting Percent stenosis (post): 0 TIMI-flow (post): 3  Conclusions: Successful PCI of severe stenosis of the right coronary artery  Recommendations: Dual antiplatelet therapy with aspirin and brilinta for at least 12 months.  Tonny Bollman 09/28/2012, 3:38 PM

## 2012-09-29 ENCOUNTER — Encounter (HOSPITAL_COMMUNITY): Payer: Self-pay | Admitting: Physician Assistant

## 2012-09-29 DIAGNOSIS — E039 Hypothyroidism, unspecified: Secondary | ICD-10-CM

## 2012-09-29 DIAGNOSIS — E785 Hyperlipidemia, unspecified: Secondary | ICD-10-CM

## 2012-09-29 DIAGNOSIS — I200 Unstable angina: Secondary | ICD-10-CM

## 2012-09-29 DIAGNOSIS — E876 Hypokalemia: Secondary | ICD-10-CM

## 2012-09-29 LAB — CBC
HCT: 33.9 % — ABNORMAL LOW (ref 36.0–46.0)
MCHC: 34.2 g/dL (ref 30.0–36.0)
MCV: 89 fL (ref 78.0–100.0)
RDW: 13.3 % (ref 11.5–15.5)

## 2012-09-29 LAB — COMPREHENSIVE METABOLIC PANEL
Albumin: 3.2 g/dL — ABNORMAL LOW (ref 3.5–5.2)
BUN: 10 mg/dL (ref 6–23)
Chloride: 104 mEq/L (ref 96–112)
Creatinine, Ser: 0.62 mg/dL (ref 0.50–1.10)
GFR calc Af Amer: >90 mL/min (ref >90)
Total Bilirubin: 0.5 mg/dL (ref 0.3–1.2)

## 2012-09-29 LAB — GLUCOSE, CAPILLARY: Glucose-Capillary: 155 mg/dL — ABNORMAL HIGH (ref 70–99)

## 2012-09-29 MED ORDER — ASPIRIN 81 MG PO TBEC: 81.0000 mg | DELAYED_RELEASE_TABLET | Freq: Every day | ORAL | Status: DC

## 2012-09-29 MED ORDER — TICAGRELOR 90 MG PO TABS: 90.0000 mg | ORAL_TABLET | Freq: Two times a day (BID) | ORAL | Status: DC

## 2012-09-29 MED ORDER — NITROGLYCERIN 0.4 MG SL SUBL: 0.4000 mg | SUBLINGUAL_TABLET | SUBLINGUAL | Status: DC | PRN

## 2012-09-29 MED ORDER — METOPROLOL TARTRATE 25 MG PO TABS: 25.0000 mg | ORAL_TABLET | Freq: Two times a day (BID) | ORAL | Status: DC

## 2012-09-29 MED FILL — Dextrose Inj 5%: INTRAVENOUS | Qty: 50 | Status: AC

## 2012-09-29 NOTE — Progress Notes (Signed)
RN called re: patient experiencing blurry vision, weakness, diaphoresis and n/v after ambulating with cardiac rehab. She received DES for severe RCA stenosis yesterday. No complaints overnight. Ambulated well with cardiac rehab. Afterwards, she sat down, ate breakfast when her symptoms began. BP noted to be 75/43 at that time. No cp or sob. EKG reveals no acute ST/T changes when compared to earlier tracings. CBG 155. H/H stable this AM. She received NS bolus with improvement in BP. Symptoms quickly resolved. On exam, there are no neuro deficits. RRR, clear S1, S2, no murmurs, lungs CTAB. Groin site healing well without bruit, bleeding or induration. Likely vagal-mediated hypotension. Will hold on morning BB. Continue to monitor. After speaking with Coop, d/c this afternoon if no further episodes.    Jacqulyn Bath, PA-C 09/29/2012 10:25 AM

## 2012-09-29 NOTE — Care Management Note (Signed)
    Page 1 of 1   09/29/2012     11:17:23 AM   CARE MANAGEMENT NOTE 09/29/2012  Patient:  Sabrina Hart, Sabrina Hart   Account Number:  [id number]  Date Initiated:  09/29/2012  Documentation initiated by:  CRAFT,TERRI  Subjective/Objective Assessment:   78 yo female admitted 09/27/12 with with chest pain.     Action/Plan:   D/C when medically stable.   Anticipated DC Date:  10/02/2012   Anticipated DC Plan:  HOME/SELF CARE      DC Planning Services  CM consult  Medication Assistance              Status of service:  Completed, signed off  Discharge Disposition:  HOME/SELF CARE  Per UR Regulation:  Reviewed for med. necessity/level of care/duration of stay    Comments:  09/29/12, Kathi Der RNC-MNN, BSN, [phone], CM received referral and met with pt.  Pt given Brilinta card. Discussed medication and pharmacy discounts.  Questions answered.

## 2012-09-29 NOTE — Progress Notes (Signed)
    Subjective:  No chest pain or dyspnea.  Objective:  Vital Signs in the last 24 hours: Temp:  [97.6 F (36.4 C)-98.5 F (36.9 C)] 97.9 F (36.6 C) (11/01 0400) Pulse Rate:  [58-70] 68  (11/01 0400) Resp:  [13-19] 18  (11/01 0400) BP: (115-171)/(45-90) 134/55 mmHg (11/01 0400) SpO2:  [96 %-100 %] 96 % (11/01 0400) Weight:  [78.9 kg (173 lb 15.1 oz)] 78.9 kg (173 lb 15.1 oz) (11/01 0400)  Intake/Output from previous day: 10/31 0701 - 11/01 0700 In: -  Out: 1000 [Urine:1000]  Physical Exam: Pt is alert and oriented, NAD HEENT: normal Neck: JVP - normal, carotids 2+= without bruits Lungs: CTA bilaterally CV: RRR without murmur or gallop Abd: soft, NT, Positive BS, no hepatomegaly Ext: no C/C/E, distal pulses intact and equal. Right groin site clear. Skin: warm/dry no rash   Lab Results:  Basename 09/29/12 0555 09/28/12 0555  WBC 4.9 4.3  HGB 11.6* 12.2  PLT 231 223    Basename 09/29/12 0555 09/28/12 1435  NA 139 138  K 3.6 4.2  CL 104 107  CO2 27 24  GLUCOSE 103* 90  BUN 10 11  CREATININE 0.62 0.57    Basename 09/28/12 1902 09/28/12 1435  TROPONINI <0.30 <0.30   Tele: Sinus rhythm, few PVC's, personally reviewed  Assessment/Plan:  1. Unstable angina pectoris: severe RCA at cath, treated with DES. Will need ASA 81 mg and brilinta 90 mg BID x 12 months. Secondary risk reduction measures as below. Recommend transition of care with 2 week follow-up. Cardiac rehab Phase 1 this am.  2. Hyperlipidemia - LDL 99. She just started pravastatin 2 weeks ago so probably not full effect yet. She is tolerating it so would continue the same.  3. HTN - continue metoprolol  Tonny Bollman, M.D. 09/29/2012, 7:30 AM

## 2012-09-29 NOTE — Progress Notes (Signed)
CARDIAC REHAB PHASE I   PRE:  Rate/Rhythm: 77 SR  BP:  Supine: 145/49  Sitting:   Standing:    SaO2:   MODE:  Ambulation: 1000 ft   POST:  Rate/Rhythem: 112 ST  BP:  Supine:   Sitting: 118/53  Standing:    SaO2:  0755-0930 Pt tolerated ambulation well without c/o of cp or SOB. VS stable. While discussing discharge education with pt, she was eating breakfast and suddenly c/o of dizziness. Pt's color became pale and she was diaphoretic. Pt was in recliner feet put up and head back. Pt got blank stare, BP 75/43. Pt's nurse in room starting IV fluids. Pt vomited undigested breakfast. Recheck on BP 134/76. Pt states that she felt better. Completed discharge education with her. She voices understanding.  Beatrix Fetters

## 2012-09-29 NOTE — Progress Notes (Signed)
Patient discussed with [facility], PA-C. Agree with plan.

## 2012-09-29 NOTE — Progress Notes (Signed)
Agree with above two notes.  Patient stable, asymptomatic, in chair reading newspaper at this time; will continue to monitor.

## 2012-09-29 NOTE — Progress Notes (Signed)
UR done. 

## 2012-09-29 NOTE — Discharge Summary (Signed)
Discharge Summary   Patient ID: Sabrina Hart,  MRN: [id number], DOB/AGE: December 21, 1931 78 y.o.  Admit date: 09/27/2012 Discharge date: 09/29/2012  Primary Physician: No primary provider on file. Primary Cardiologist: seen in consultation by Dr. Gala Romney, cathed by Dr. Elease Hashimoto  Discharge Diagnoses Principal Problem:  *Unstable angina Active Problems:  Hypothyroidism  Hyperlipidemia  Hypokalemia   Allergies Allergies  Allergen Reactions  . Other Other (See Comments)    Metals; "skin gets weepy; gets worse if contact >s" (09/28/2012)  . Pollen Extract Other (See Comments)    "sneezing; watery eyes"  . Latex Itching and Swelling  . Morphine And Related Rash  . Penicillins Rash    Diagnostic Studies/Procedures  PA/LATERAL CHEST X-RAY - 09/27/12  Comparison: None.  Findings: Artifact overlies the chest. Heart size is normal.  Mediastinal shadows are normal. The lungs are clear. No  effusions. No bony abnormalities.  IMPRESSION:  No active disease  CARDIAC CATHETERIZATION- ANGIOGRAPHY - 09/28/12  Hemodynamics:  LV pressure: 118/12  Aortic pressure: 115/55  Angiography  Left Main: JL3.5 used to canulate the LM. Small and normal  Left anterior Descending: Moderate diffuse disease. 20-30% stenosis throughout. The 1st diag is a large vessel with mild - moderate diffuse disease. There is a focal 70% stenosis in the distal 1st diag.  Left Circumflex: LCx - moderate size vessel with mild diffuse disease. 1st OM is moderate and has a focal 50-60% stenosis  Right Coronary Artery: Large and dominant , diffuse disease. There is a mid 60% stenosis and a mid/distal 90% stenosis. The PDA and PLSA have mild irregularities.  LV Gram: hyperdynamic LV systolic function. EF 75-80%  Complications: No apparent complications  Patient did tolerate procedure well.  Conclusions:  1. Diffuse CAD with a tight stenosis in the mid/distal RCA. We will proceed with stenting later today. I have reviewed  the angiograms with Dr. Excell Seltzer.  2. Hyperdynamic LV function  CARDIAC CATHETERIZATION- PCI - 09/28/12 Lesion Data:  Vessel: RCA/mid  Percent stenosis (pre): 90  TIMI-flow (pre): 3  Stent: 2.75 x 28 mm drug-eluting  Percent stenosis (post): 0  TIMI-flow (post): 3  Conclusions: Successful PCI of severe stenosis of the right coronary artery  History of Present Illness  Sabrina Hart is a 78 yo AA female admitted to [facility] on 09/27/12 w/ the above problem list. She reported a prior history of CAD s/p PCI in [place] 15-20 yrs ago. She has lived in [place] > 10 years. She reported midsternal chest pain on minimal exertion x 1 moth radiating to both arms and neck w/ assoc SOB, nausea and diaphoresis typically relieved with rest. She had a severe episode the day of admission unrelieved w/ rest thus prompting her ED presentation.   There, EKG revealed no acute ischemic changes. Initial trop-I WNL. She was given ASA, NTG and started on a heparin gtt. Chest pain free at the time of eval. BMET revealed a mild hypokelamia, but was otherwise unremarkable along with CBC. CXR w/o acute dz. The plan was made to observe overnight and rule-out with consideration of further ischemic work-up/intervention the following morning.   Hospital Course   She was resumed on home meds and remained stable overnight. Two subsequent trop-I returned normal. A1C returned normal. Lipid panel revealed LDL 99. TSH returned low. BP was noted to be elevated on several readings, and BB was started with improvement. Given her prior cardiac history, risk factors in age and HL, and HPI concerning for USAP, the plan was made to  pursue cardiac catheterization.  She was informed, consented and prepped for cardiac catheterization as noted above. This revealed 20-30% diffuse LAD stenosis, mild-mod D1 dz, 70% distal D1 lesion, mild diffuse LCx dz, 50-60% OM1 stenosis, 60% mid & 90% mid-dis RCA stenosis; LVEF 75-80% w/ hyperdynamic fxn. The results were  reviewed with Dr. Excell Seltzer. She was randomized to either Plavix or Brilinta loading as part of the ad hoc Brilinta PCI trial. She underwent successful DES x 1 to 90% RCA stenosis. She tolerated the procedure well w/o complications. The recommendation was made to purse DAPT- ASA/Brilinta x 1 year.  She was transferred to CRU. She remained stable overnight and ambulated well with cardiac rehab in the morning. Two sets of CEs returned WNL. She did have an episode of vagal-mediated hypotension while seated during breakfast at which time she experienced lightheadedness, blurry vision, diaphoresis and n/v. She was given IVF and BB was held. This improved.   She was assessed by Dr. Excell Seltzer and felt to be stable for discharge. She will follow-up in </= 14 days. CM has arranged w/ Brilinta assistance. She will continue statin and BB. She was advised to follow-up w/ her PCP for hypothyroidism and elevated BP follow-up. She will pursue OP cardiac rehab. This information, including post-cath instructions, have been clearly outlined in the discharge AVS.   Discharge Vitals:  Blood pressure 75/43, pulse 72, temperature 97.7 F (36.5 C), temperature source Oral, resp. rate 18, height 5\' 4"  (1.626 m), weight 78.9 kg (173 lb 15.1 oz), SpO2 96.00%.   Weight change: -2.294 kg (-5 lb 0.9 oz)  Labs: Recent Labs  [facility] 09/29/12 0555 09/28/12 0555   WBC 4.9 4.3   HGB 11.6* 12.2   HCT 33.9* 35.9*   MCV 89.0 89.1   PLT 231 223    Lab 09/29/12 0555 09/28/12 1435 09/28/12 0555  NA 139 138 141  K 3.6 4.2 3.4*  CL 104 107 106  CO2 27 24 25   BUN 10 11 15   CREATININE 0.62 0.57 0.68  CALCIUM 9.1 8.7 8.9  PROT 6.4 -- --  BILITOT 0.5 -- --  ALKPHOS 74 -- --  ALT 11 -- --  AST 16 -- --  AMYLASE -- -- --  LIPASE -- -- --  GLUCOSE 103* 90 94   Recent Labs  Basename 09/28/12 0555   HGBA1C 5.6   Recent Labs  Basename 09/28/12 1902 09/28/12 1435 09/28/12 0555   CKTOTAL 181* 163 --   CKMB 2.4 2.5 --    CKMBINDEX -- -- --   TROPONINI <0.30 <0.30 <0.30   Recent Labs  Basename 09/28/12 0555   CHOL 179   HDL 68   LDLCALC 99   TRIG 62   CHOLHDL 2.6   LDLDIRECT --    Basename 09/28/12 1922  TSH 0.146*  T4TOTAL --  T3FREE --  THYROIDAB --    Disposition:  Discharge Orders    Future Appointments: Provider: Department: Dept Phone: Center:   10/10/2012 1:30 PM Rosalio Macadamia, NP [facility] [phone] [facility]     Future Orders Please Complete By Expires   Amb Referral to Cardiac Rehabilitation        Follow-up Information    Please follow up. (Please establish/follow-up with your primary care doctor in 1-2 weeks for management of hypothyroidism. Low thyroid level (indicating need to reduce Synthroid) this admission. )       Follow up with Norma Fredrickson, NP. On 10/10/2012. (At 1:30 PM for follow-up after this  hospitalization. )    Contact information:   [address] [place] [address] [phone]          Discharge Medications:    Medication List     As of 09/29/2012  3:13 PM    START taking these medications         aspirin 81 MG EC tablet   Take 1 tablet (81 mg total) by mouth daily.      metoprolol tartrate 25 MG tablet   Commonly known as: LOPRESSOR   Take 1 tablet (25 mg total) by mouth 2 (two) times daily.      nitroGLYCERIN 0.4 MG SL tablet   Commonly known as: NITROSTAT   Place 1 tablet (0.4 mg total) under the tongue every 5 (five) minutes as needed for chest pain.      Ticagrelor 90 MG Tabs tablet   Commonly known as: BRILINTA   Take 1 tablet (90 mg total) by mouth 2 (two) times daily.      CONTINUE taking these medications         dorzolamide-timolol 22.3-6.8 MG/ML ophthalmic solution   Commonly known as: COSOPT      levothyroxine 125 MCG tablet   Commonly known as: SYNTHROID, LEVOTHROID      pravastatin 40 MG tablet   Commonly known as: PRAVACHOL          Where to get your medications    These  are the prescriptions that you need to pick up. We sent them to a specific pharmacy, so you will need to go there to get them.   [id number] [place], [place] - [address] [facility] [address]    [address] [place] [address]    Phone: [phone]        metoprolol tartrate 25 MG tablet   nitroGLYCERIN 0.4 MG SL tablet   Ticagrelor 90 MG Tabs tablet         Information on where to get these meds is not yet available. Ask your nurse or doctor.         aspirin 81 MG EC tablet           Outstanding Labs/Studies: None  Duration of Discharge Encounter: Greater than 30 minutes including physician time.  Signed, R. Hurman Horn, PA-C 09/29/2012, 3:13 PM

## 2012-10-04 ENCOUNTER — Ambulatory Visit (INDEPENDENT_AMBULATORY_CARE_PROVIDER_SITE_OTHER): Payer: Medicare Other | Admitting: Nurse Practitioner

## 2012-10-04 ENCOUNTER — Encounter (INDEPENDENT_AMBULATORY_CARE_PROVIDER_SITE_OTHER): Payer: Medicare Other

## 2012-10-04 ENCOUNTER — Other Ambulatory Visit: Payer: Self-pay | Admitting: *Deleted

## 2012-10-04 ENCOUNTER — Telehealth: Payer: Self-pay | Admitting: Cardiovascular Disease

## 2012-10-04 DIAGNOSIS — M79609 Pain in unspecified limb: Secondary | ICD-10-CM

## 2012-10-04 DIAGNOSIS — Z9889 Other specified postprocedural states: Secondary | ICD-10-CM

## 2012-10-04 DIAGNOSIS — R1909 Other intra-abdominal and pelvic swelling, mass and lump: Secondary | ICD-10-CM

## 2012-10-04 NOTE — Telephone Encounter (Signed)
Pt evaluated by nurse and seen by Norma Fredrickson as a walk-in nurse visit.

## 2012-10-04 NOTE — Telephone Encounter (Signed)
Pt wants to talk to someone to reassure her that everything is going ok after her procedure

## 2012-10-04 NOTE — Progress Notes (Signed)
Pt walked into clinic with concerns relative to her right groin cath site.  Pt had heart cath by Dr. Elease Hashimoto and Excell Seltzer on 09/28/2012.  Pt c/o increased swelling to right thigh, groin and posterior knee.  Groin checked by Norma Fredrickson, NP and subsequent Right Groin ultrasound ordered.

## 2012-10-05 ENCOUNTER — Telehealth: Payer: Self-pay | Admitting: *Deleted

## 2012-10-09 ENCOUNTER — Ambulatory Visit (INDEPENDENT_AMBULATORY_CARE_PROVIDER_SITE_OTHER): Payer: Medicare Other | Admitting: Nurse Practitioner

## 2012-10-09 ENCOUNTER — Encounter: Payer: Self-pay | Admitting: Nurse Practitioner

## 2012-10-09 VITALS — BP 140/68 | HR 72 | Ht 64.0 in | Wt 172.0 lb

## 2012-10-09 DIAGNOSIS — R079 Chest pain, unspecified: Secondary | ICD-10-CM

## 2012-10-09 DIAGNOSIS — I259 Chronic ischemic heart disease, unspecified: Secondary | ICD-10-CM

## 2012-10-09 LAB — CBC WITH DIFFERENTIAL/PLATELET
Basophils Absolute: 0 10*3/uL (ref 0.0–0.1)
Basophils Relative: 0.4 % (ref 0.0–3.0)
Eosinophils Absolute: 0.1 10*3/uL (ref 0.0–0.7)
Eosinophils Relative: 1.1 % (ref 0.0–5.0)
HCT: 34.8 % — ABNORMAL LOW (ref 36.0–46.0)
Hemoglobin: 11.4 g/dL — ABNORMAL LOW (ref 12.0–15.0)
Lymphocytes Relative: 15.7 % (ref 12.0–46.0)
Lymphs Abs: 1.1 10*3/uL (ref 0.7–4.0)
MCHC: 32.9 g/dL (ref 30.0–36.0)
MCV: 93.8 fl (ref 78.0–100.0)
Monocytes Absolute: 0.7 10*3/uL (ref 0.1–1.0)
Monocytes Relative: 9.3 % (ref 3.0–12.0)
Neutro Abs: 5.2 10*3/uL (ref 1.4–7.7)
Neutrophils Relative %: 73.5 % (ref 43.0–77.0)
Platelets: 338 10*3/uL (ref 150.0–400.0)
RBC: 3.71 Mil/uL — ABNORMAL LOW (ref 3.87–5.11)
RDW: 14.4 % (ref 11.5–14.6)
WBC: 7.1 10*3/uL (ref 4.5–10.5)

## 2012-10-09 LAB — BASIC METABOLIC PANEL
BUN: 16 mg/dL (ref 6–23)
CO2: 28 mEq/L (ref 19–32)
Calcium: 8.9 mg/dL (ref 8.4–10.5)
Chloride: 102 mEq/L (ref 96–112)
Creatinine, Ser: 0.6 mg/dL (ref 0.4–1.2)
GFR: 124.23 mL/min (ref >60.00)
Glucose, Bld: 101 mg/dL — ABNORMAL HIGH (ref 70–99)
Potassium: 3.6 mEq/L (ref 3.5–5.1)
Sodium: 137 mEq/L (ref 135–145)

## 2012-10-09 MED ORDER — ISOSORBIDE MONONITRATE ER 30 MG PO TB24: 30.0000 mg | ORAL_TABLET | Freq: Every day | ORAL | Status: DC

## 2012-10-09 NOTE — Progress Notes (Signed)
Sabrina Hart Date of Birth: [birth year] Medical Record #[id number]  History of Present Illness: Sabrina Hart is seen back today for a post hospital visit. She is seen for Dr. Elease Hart. She has known CAD with remote PCI in [place] 15 to 20 years ago. Details unknown. Has had recent DES to the RCA at the end of October due to chest pain. Her other issues are as noted.   She was seen last week with concern for a groin hematoma. Ultrasound was benign. Her TSH was low and BP was elevated during this past admission with instructions for her to see her PCP.   She comes back today. She is here with her son. She says she is doing ok. She continues to have some "mild" chest pain. Very similar to her presenting symptoms. She has been using sl NTG. It occurs with no real rhyme or reason. Sometimes she is doing stuff. It will go away with rest. Her groin is still a little sore and is bruised but improving. She is walking short distances. She is interested in cardiac rehab. Tolerating her medicines. Not dizzy or lightheaded.   Current Outpatient Prescriptions on File Prior to Visit  Medication Sig Dispense Refill  . aspirin EC 81 MG EC tablet Take 1 tablet (81 mg total) by mouth daily.      . dorzolamide-timolol (COSOPT) 22.3-6.8 MG/ML ophthalmic solution Place 1 drop into both eyes 2 (two) times daily.      Marland Kitchen levothyroxine (SYNTHROID, LEVOTHROID) 125 MCG tablet Take 125 mcg by mouth daily.      . metoprolol tartrate (LOPRESSOR) 25 MG tablet Take 1 tablet (25 mg total) by mouth 2 (two) times daily.  60 tablet  3  . nitroGLYCERIN (NITROSTAT) 0.4 MG SL tablet Place 1 tablet (0.4 mg total) under the tongue every 5 (five) minutes as needed for chest pain.  25 tablet  3  . pravastatin (PRAVACHOL) 40 MG tablet Take 40 mg by mouth daily.      . Ticagrelor (BRILINTA) 90 MG TABS tablet Take 1 tablet (90 mg total) by mouth 2 (two) times daily.  60 tablet  3  . isosorbide mononitrate (IMDUR) 30 MG 24 hr tablet Take 1 tablet  (30 mg total) by mouth daily.  60 tablet  6    Allergies  Allergen Reactions  . Other Other (See Comments)    Metals; "skin gets weepy; gets worse if contact >s" (09/28/2012)  . Pollen Extract Other (See Comments)    "sneezing; watery eyes"  . Latex Itching and Swelling  . Morphine And Related Rash  . Penicillins Rash    Past Medical History  Diagnosis Date  . Glaucoma   . Coronary artery disease Sep 27, 2012    s/p PCI of the RCA with DES with remote PCI in [place] 15 to 20 years ago.   Marland Kitchen Hypothyroidism   . Hyperlipidemia   . Anginal pain     1990's; 09/28/2012  . Arthritis     "very mild" (09/28/2012)    Past Surgical History  Procedure Date  . Cataract extraction w/ intraocular lens  implant, bilateral 1970's  . Lumbar disc surgery 1980's    "took out a little piece" (09/28/2012)  . Reduction mammaplasty 1970's  . Coronary angioplasty with stent placement 09/28/2012    20-30% diffuse LAD stenosis, mild-mod D1 dz, 70% distal D1 lesion, mild diffuse LCx dz, 50-60% OM1 stenosis, 60% mid & 90% mid-dis RCA stenosis s/p DES; LVEF 75-80% w/ hyperdynamic  fxn.   . Dental surgery     "implants"  . Inner ear surgery ~ 2010    "put plugs in so I could fly" (09/28/2012)  . Cardiac catheterization 1990s    PCI    History  Smoking status  . Never Smoker   Smokeless tobacco  . Never Used    History  Alcohol Use  . 0.6 oz/week  . 1 Glasses of wine per week    Comment: 09/28/2012 "1 glass of wine averages once/wk; beer average once/week during summer w/friends"    History reviewed. No pertinent family history.  Review of Systems: The review of systems is per the HPI.  All other systems were reviewed and are negative.  Physical Exam: BP 140/68  Pulse 72  Ht 5\' 4"  (1.626 m)  Wt 172 lb (78.019 kg)  BMI 29.52 kg/m2 Patient is very pleasant and in no acute distress. Skin is warm and dry. Color is normal.  HEENT is unremarkable. Normocephalic/atraumatic. PERRL.  Sclera are nonicteric. Neck is supple. No masses. No JVD. Lungs are clear. Cardiac exam shows a regular rate and rhythm. Abdomen is soft. Extremities are without edema. Her right groin still has a knot about the size and shape of a flashdrive. She is bruised. It is all soft. No bruit over the right groin noted. Prior ultrasound last week was negative.  Gait and ROM are intact. No gross neurologic deficits noted.   LABORATORY DATA: BMET and CBC are pending.    Coronary Angiography   Left Main: JL3.5 used to canulate the LM. Small and normal  Left anterior Descending: Moderate diffuse disease. 20-30% stenosis throughout. The 1st diag is a large vessel with mild - moderate diffuse disease. There is a focal 70% stenosis in the distal 1st diag.  Left Circumflex: LCx - moderate size vessel with mild diffuse disease. 1st OM is moderate and has a focal 50-60% stenosis  Right Coronary Artery: Large and dominant , diffuse disease. There is a mid 60% stenosis and a mid/distal 90% stenosis. The PDA and PLSA have mild irregularities.  LV Gram: hyperdynamic LV systolic function. EF 75-80%  Complications: No apparent complications  Patient did tolerate procedure well.   Conclusions:  1. Diffuse CAD with a tight stenosis in the mid/distal RCA. We will proceed with stenting later today. I have reviewed the angiograms with Dr. Excell Seltzer.  2. Hyperdynamic LV function  [name], [name]., MD, [facility]  09/28/2012, 12:07 PM  Office - [phone]  Pager [phone]  Lab Results  Component Value Date   WBC 4.9 09/29/2012   HGB 11.6* 09/29/2012   HCT 33.9* 09/29/2012   PLT 231 09/29/2012   GLUCOSE 103* 09/29/2012   CHOL 179 09/28/2012   TRIG 62 09/28/2012   HDL 68 09/28/2012   LDLCALC 99 09/28/2012   ALT 11 09/29/2012   AST 16 09/29/2012   NA 139 09/29/2012   K 3.6 09/29/2012   CL 104 09/29/2012   CREATININE 0.62 09/29/2012   BUN 10 09/29/2012   CO2 27 09/29/2012   TSH 0.146* 09/28/2012   INR 1.11 09/28/2012   HGBA1C  5.6 09/28/2012    Assessment / Plan: 1. CAD with recent DES to the RCA and prior remote PCI - she is doing well but continues to have chest pain which sounds like angina. She had residual disease on her cath noted. I have started Imdur 30 mg a day. She can continue to use her sl NTG prn as well. She is to  let us know if she does not improve. I am not sure if her other lesions are amenable to further intervention. I have held off on sending her to cardiac rehab due to her leg and her symptoms. Hope to refer her at her return visit.   2. HTN - blood pressure is a little borderline. Imdur is added today. She will try to monitor at home. Restrct salt as well.   3. Hypothyroidism   I will get Dr. Elease Hart to see her back in about 3 weeks. Imdur is added today. I will have him review her angiogram. Patient is agreeable to this plan and will call if any problems develop in the interim.

## 2012-10-09 NOTE — Patient Instructions (Signed)
We are going to start you on Imdur 30 mg a day  You can still use the NTG sl if needed. Use your NTG under your tongue for recurrent chest pain. May take one tablet every 5 minutes. If you are still having discomfort after 3 tablets in 15 minutes, call 911.  Dr. Elease Hashimoto will see you in about 3 weeks. We hope to send you to cardiac rehab when we see you back.   Activity is to be as you feel. You may walk 5 to 10 minutes two times a day. We will increase this as your leg gets better.  We need to check labs today.   Call the [facility] office at [phone] if you have any questions, problems or concerns.

## 2012-10-10 ENCOUNTER — Encounter: Payer: Medicare Other | Admitting: Nurse Practitioner

## 2012-10-17 ENCOUNTER — Telehealth: Payer: Self-pay | Admitting: Internal Medicine

## 2012-10-17 NOTE — Telephone Encounter (Signed)
Opened in error [id number]

## 2012-10-31 ENCOUNTER — Encounter: Payer: Self-pay | Admitting: Cardiovascular Disease

## 2012-10-31 ENCOUNTER — Ambulatory Visit (INDEPENDENT_AMBULATORY_CARE_PROVIDER_SITE_OTHER): Payer: Medicare Other | Admitting: Cardiovascular Disease

## 2012-10-31 VITALS — BP 118/60 | HR 89 | Ht 64.0 in | Wt 168.4 lb

## 2012-10-31 DIAGNOSIS — I200 Unstable angina: Secondary | ICD-10-CM

## 2012-10-31 NOTE — Progress Notes (Signed)
Gardiner Ramus Date of Birth  03-11-1932       [facility]    [place] [address]  [address], [address] [place], [place]  [address]   [place], [place]  [address] [phone]     [phone]   Fax  [phone]    Fax [phone]  Problem List: 1. CAD:   09/28/12  She has mild disease of her LAD. The first diagonal artery has a focal 70% stenosis in the distal aspect of the vessel. The right coronary artery had a mid/distal 90% stenosis.  He had placement of a 2.75 x 28 mm drug-eluting stent ( Promus Element)  placed in the mid/distal right coronary artery.    2. hypothyroidism 3. Hyperlipidemia  History of Present Illness:  Ms. Merlino is a 78 yo with hx of CAD - s/p PCI of her RCA  .  She has had som vaginal bleeding and stopped her Brilinta and ASA.  She skipped 1 day and then restarted.  She denies any chest pain.   She has not been exercising but is interested in starting back.  She had numerous questions about how much she can exercise.  She is walking down the street and uses "the number of mailboxes that they pass" as a marker for distance.  She was started on Imdur but has had some urinary irritation and wants to stop it.    Current Outpatient Prescriptions on File Prior to Visit  Medication Sig Dispense Refill  . aspirin EC 81 MG EC tablet Take 1 tablet (81 mg total) by mouth daily.      . dorzolamide-timolol (COSOPT) 22.3-6.8 MG/ML ophthalmic solution Place 1 drop into both eyes 2 (two) times daily.      . isosorbide mononitrate (IMDUR) 30 MG 24 hr tablet Take 1 tablet (30 mg total) by mouth daily.  60 tablet  6  . levothyroxine (SYNTHROID, LEVOTHROID) 125 MCG tablet Take 125 mcg by mouth daily.      . metoprolol tartrate (LOPRESSOR) 25 MG tablet Take 1 tablet (25 mg total) by mouth 2 (two) times daily.  60 tablet  3  . nitroGLYCERIN (NITROSTAT) 0.4 MG SL tablet Place 1 tablet (0.4 mg total) under the tongue every 5 (five) minutes as  needed for chest pain.  25 tablet  3  . pravastatin (PRAVACHOL) 40 MG tablet Take 40 mg by mouth daily.      . Ticagrelor (BRILINTA) 90 MG TABS tablet Take 1 tablet (90 mg total) by mouth 2 (two) times daily.  60 tablet  3    Allergies  Allergen Reactions  . Other Other (See Comments)    Metals; "skin gets weepy; gets worse if contact >s" (09/28/2012)  . Pollen Extract Other (See Comments)    "sneezing; watery eyes"  . Latex Itching and Swelling  . Morphine And Related Rash  . Penicillins Rash    Past Medical History  Diagnosis Date  . Glaucoma   . Coronary artery disease Sep 27, 2012    s/p PCI of the RCA with DES with remote PCI in [place] 15 to 20 years ago.   Marland Kitchen Hypothyroidism   . Hyperlipidemia   . Anginal pain     1990's; 09/28/2012  . Arthritis     "very mild" (09/28/2012)    Past Surgical History  Procedure Date  . Cataract extraction w/ intraocular lens  implant, bilateral 1970's  . Lumbar disc surgery 1980's    "  took out a little piece" (09/28/2012)  . Reduction mammaplasty 1970's  . Coronary angioplasty with stent placement 09/28/2012    20-30% diffuse LAD stenosis, mild-mod D1 dz, 70% distal D1 lesion, mild diffuse LCx dz, 50-60% OM1 stenosis, 60% mid & 90% mid-dis RCA stenosis s/p DES; LVEF 75-80% w/ hyperdynamic fxn.   . Dental surgery     "implants"  . Inner ear surgery ~ 2010    "put plugs in so I could fly" (09/28/2012)  . Cardiac catheterization 1990s    PCI    History  Smoking status  . Never Smoker   Smokeless tobacco  . Never Used    History  Alcohol Use  . 0.6 oz/week  . 1 Glasses of wine per week    Comment: 09/28/2012 "1 glass of wine averages once/wk; beer average once/week during summer w/friends"    No family history on file.  Reviw of Systems:  Reviewed in the HPI.  All other systems are negative.  Physical Exam: Blood pressure 118/60, pulse 89, height 5\' 4"  (1.626 m), weight 168 lb 6.4 oz (76.386 kg), SpO2  98.00%. General: Well developed, well nourished, in no acute distress.  Head: Normocephalic, atraumatic, sclera non-icteric, mucus membranes are moist,   Neck: Supple. Carotids are 2 + without bruits. No JVD   Lungs: Clear   Heart: RR, normal S1 and S2  Abdomen: Soft, non-tender, non-distended with normal bowel sounds.  Msk:  Strength and tone are normal   Extremities: No clubbing or cyanosis. No edema.  Distal pedal pulses are 2+ and equal    Neuro: CN II - XII intact.  Alert and oriented X 3.   Psych:  Normal   ECG:  Assessment / Plan:

## 2012-10-31 NOTE — Assessment & Plan Note (Signed)
Her presents following her PCI of right coronary artery. She's not had any episodes of angina. She did tell me about some vaginal bleeding and she held her Brilinta and ASA for one day and the bleeding has improved. She's still having some slight bleeding. She's restart her Brilinta  and aspirin and was only off for one day.  I have asked her to stay on her  medications as she is at high risk for stent thrombosis. I've asked her to see her GYN doctor for further evaluation of this vaginal bleeding.  She may start cardiac rehabilitation.  I see her again in 6 months for followup office visit, fasting labs, and EKG.

## 2012-10-31 NOTE — Patient Instructions (Addendum)
Please call your ob/gyn or pcp to have an exam for vaginal bleeding  Your physician wants you to follow-up in: 6 months  You will receive a reminder letter in the mail two months in advance. If you don't receive a letter, please call our office to schedule the follow-up appointment.  Your physician recommends that you return for a FASTING lipid profile: 6 months

## 2012-11-30 ENCOUNTER — Encounter (HOSPITAL_COMMUNITY)
Admission: RE | Admit: 2012-11-30 | Discharge: 2012-11-30 | Disposition: A | Discharge status: Home / Self Care | Payer: Medicare Other | Source: Ambulatory Visit | Attending: Cardiovascular Disease | Admitting: Cardiovascular Disease

## 2012-11-30 DIAGNOSIS — Z5189 Encounter for other specified aftercare: Secondary | ICD-10-CM | POA: Insufficient documentation

## 2012-11-30 DIAGNOSIS — Z9861 Coronary angioplasty status: Secondary | ICD-10-CM | POA: Insufficient documentation

## 2012-11-30 DIAGNOSIS — I10 Essential (primary) hypertension: Secondary | ICD-10-CM | POA: Insufficient documentation

## 2012-11-30 DIAGNOSIS — I2510 Atherosclerotic heart disease of native coronary artery without angina pectoris: Secondary | ICD-10-CM | POA: Insufficient documentation

## 2012-12-04 ENCOUNTER — Encounter (HOSPITAL_COMMUNITY)
Admission: RE | Admit: 2012-12-04 | Discharge: 2012-12-04 | Disposition: A | Discharge status: Home / Self Care | Payer: Medicare Other | Source: Ambulatory Visit | Attending: Cardiovascular Disease | Admitting: Cardiovascular Disease

## 2012-12-04 ENCOUNTER — Encounter (HOSPITAL_COMMUNITY): Payer: Medicare Other

## 2012-12-04 NOTE — Progress Notes (Signed)
Sabrina Hart is here today for her first day of exercise at cardiac rehab.  Patient reports having chest tightness this past Saturday after going outside to feed her pets. Keymoni said she took a sublingual nitroglycerin with relief.  Kevionna denies having any chest discomfort today.  Patient placed on telemetry. Normal sinus rhythm.  Dr Elease Hashimoto called and notified.  Dr Elease Hashimoto called and notified said the patient can exercise.  Will send ECG tracing's via Careers information officer for Dr Elease Hashimoto to review.

## 2012-12-04 NOTE — Progress Notes (Signed)
Verma completed exercise without having any complaints today. Will continue to monitor the patient throughout  the program.

## 2012-12-06 ENCOUNTER — Encounter (HOSPITAL_COMMUNITY): Payer: Medicare Other

## 2012-12-06 ENCOUNTER — Encounter (HOSPITAL_COMMUNITY)
Admission: RE | Admit: 2012-12-06 | Discharge: 2012-12-06 | Disposition: A | Discharge status: Home / Self Care | Payer: Medicare Other | Source: Ambulatory Visit | Attending: Cardiovascular Disease | Admitting: Cardiovascular Disease

## 2012-12-08 ENCOUNTER — Encounter (HOSPITAL_COMMUNITY): Payer: Medicare Other

## 2012-12-08 ENCOUNTER — Encounter (HOSPITAL_COMMUNITY)
Admission: RE | Admit: 2012-12-08 | Discharge: 2012-12-08 | Disposition: A | Discharge status: Home / Self Care | Payer: Medicare Other | Source: Ambulatory Visit | Attending: Cardiovascular Disease | Admitting: Cardiovascular Disease

## 2012-12-11 ENCOUNTER — Encounter (HOSPITAL_COMMUNITY)
Admission: RE | Admit: 2012-12-11 | Discharge: 2012-12-11 | Disposition: A | Discharge status: Home / Self Care | Payer: Medicare Other | Source: Ambulatory Visit | Attending: Cardiovascular Disease | Admitting: Cardiovascular Disease

## 2012-12-11 ENCOUNTER — Encounter (HOSPITAL_COMMUNITY): Payer: Medicare Other

## 2012-12-13 ENCOUNTER — Encounter (HOSPITAL_COMMUNITY): Payer: Medicare Other

## 2012-12-13 ENCOUNTER — Encounter (HOSPITAL_COMMUNITY)
Admission: RE | Admit: 2012-12-13 | Discharge: 2012-12-13 | Disposition: A | Discharge status: Home / Self Care | Payer: Medicare Other | Source: Ambulatory Visit | Attending: Cardiovascular Disease | Admitting: Cardiovascular Disease

## 2012-12-15 ENCOUNTER — Encounter (HOSPITAL_COMMUNITY)
Admission: RE | Admit: 2012-12-15 | Discharge: 2012-12-15 | Disposition: A | Discharge status: Home / Self Care | Payer: Medicare Other | Source: Ambulatory Visit | Attending: Cardiovascular Disease | Admitting: Cardiovascular Disease

## 2012-12-15 ENCOUNTER — Encounter (HOSPITAL_COMMUNITY): Payer: Medicare Other

## 2012-12-18 ENCOUNTER — Encounter (HOSPITAL_COMMUNITY)
Admission: RE | Admit: 2012-12-18 | Discharge: 2012-12-18 | Disposition: A | Discharge status: Home / Self Care | Payer: Medicare Other | Source: Ambulatory Visit | Attending: Cardiovascular Disease | Admitting: Cardiovascular Disease

## 2012-12-18 ENCOUNTER — Encounter (HOSPITAL_COMMUNITY): Payer: Medicare Other

## 2012-12-18 NOTE — Progress Notes (Signed)
Reviewed home exercise with pt today.  Pt plans to walk at home and at stores for exercise.  Reviewed THR, pulse, RPE, sign and symptoms, NTG use, and when to call 911 or MD.  Pt voiced understanding. Fabio Pierce, MA, ACSM RCEP

## 2012-12-20 ENCOUNTER — Encounter (HOSPITAL_COMMUNITY): Payer: Medicare Other

## 2012-12-22 ENCOUNTER — Encounter (HOSPITAL_COMMUNITY): Payer: Medicare Other

## 2012-12-22 ENCOUNTER — Encounter (HOSPITAL_COMMUNITY)
Admission: RE | Admit: 2012-12-22 | Discharge: 2012-12-22 | Disposition: A | Discharge status: Home / Self Care | Payer: Medicare Other | Source: Ambulatory Visit | Attending: Cardiovascular Disease | Admitting: Cardiovascular Disease

## 2012-12-25 ENCOUNTER — Encounter (HOSPITAL_COMMUNITY)
Admission: RE | Admit: 2012-12-25 | Discharge: 2012-12-25 | Disposition: A | Discharge status: Home / Self Care | Payer: Medicare Other | Source: Ambulatory Visit | Attending: Cardiovascular Disease | Admitting: Cardiovascular Disease

## 2012-12-25 ENCOUNTER — Encounter (HOSPITAL_COMMUNITY): Payer: Medicare Other

## 2012-12-27 ENCOUNTER — Encounter (HOSPITAL_COMMUNITY): Payer: Medicare Other

## 2012-12-29 ENCOUNTER — Encounter (HOSPITAL_COMMUNITY): Payer: Medicare Other

## 2012-12-29 ENCOUNTER — Encounter (HOSPITAL_COMMUNITY)
Admission: RE | Admit: 2012-12-29 | Discharge: 2012-12-29 | Disposition: A | Discharge status: Home / Self Care | Payer: Medicare Other | Source: Ambulatory Visit | Attending: Cardiovascular Disease | Admitting: Cardiovascular Disease

## 2013-01-01 ENCOUNTER — Encounter (HOSPITAL_COMMUNITY): Payer: Medicare Other

## 2013-01-01 ENCOUNTER — Encounter (HOSPITAL_COMMUNITY)
Admission: RE | Admit: 2013-01-01 | Discharge: 2013-01-01 | Disposition: A | Discharge status: Home / Self Care | Payer: Medicare Other | Source: Ambulatory Visit | Attending: Cardiovascular Disease | Admitting: Cardiovascular Disease

## 2013-01-01 DIAGNOSIS — I10 Essential (primary) hypertension: Secondary | ICD-10-CM | POA: Insufficient documentation

## 2013-01-01 DIAGNOSIS — I2510 Atherosclerotic heart disease of native coronary artery without angina pectoris: Secondary | ICD-10-CM | POA: Insufficient documentation

## 2013-01-01 DIAGNOSIS — Z5189 Encounter for other specified aftercare: Secondary | ICD-10-CM | POA: Insufficient documentation

## 2013-01-01 DIAGNOSIS — Z9861 Coronary angioplasty status: Secondary | ICD-10-CM | POA: Insufficient documentation

## 2013-01-03 ENCOUNTER — Encounter (HOSPITAL_COMMUNITY)
Admission: RE | Admit: 2013-01-03 | Discharge: 2013-01-03 | Disposition: A | Discharge status: Home / Self Care | Payer: Medicare Other | Source: Ambulatory Visit | Attending: Cardiovascular Disease | Admitting: Cardiovascular Disease

## 2013-01-03 ENCOUNTER — Encounter (HOSPITAL_COMMUNITY): Payer: Medicare Other

## 2013-01-05 ENCOUNTER — Encounter (HOSPITAL_COMMUNITY)
Admission: RE | Admit: 2013-01-05 | Discharge: 2013-01-05 | Disposition: A | Discharge status: Home / Self Care | Payer: Medicare Other | Source: Ambulatory Visit | Attending: Cardiovascular Disease | Admitting: Cardiovascular Disease

## 2013-01-05 ENCOUNTER — Encounter (HOSPITAL_COMMUNITY): Payer: Medicare Other

## 2013-01-08 ENCOUNTER — Encounter (HOSPITAL_COMMUNITY): Payer: Medicare Other

## 2013-01-08 ENCOUNTER — Encounter (HOSPITAL_COMMUNITY)
Admission: RE | Admit: 2013-01-08 | Discharge: 2013-01-08 | Disposition: A | Discharge status: Home / Self Care | Payer: Medicare Other | Source: Ambulatory Visit | Attending: Cardiovascular Disease | Admitting: Cardiovascular Disease

## 2013-01-10 ENCOUNTER — Encounter (HOSPITAL_COMMUNITY): Payer: Medicare Other

## 2013-01-12 ENCOUNTER — Encounter (HOSPITAL_COMMUNITY): Payer: Medicare Other

## 2013-01-13 ENCOUNTER — Other Ambulatory Visit: Payer: Self-pay

## 2013-01-15 ENCOUNTER — Encounter (HOSPITAL_COMMUNITY)
Admission: RE | Admit: 2013-01-15 | Discharge: 2013-01-15 | Disposition: A | Discharge status: Home / Self Care | Payer: Medicare Other | Source: Ambulatory Visit | Attending: Cardiovascular Disease | Admitting: Cardiovascular Disease

## 2013-01-15 ENCOUNTER — Encounter (HOSPITAL_COMMUNITY): Payer: Medicare Other

## 2013-01-17 ENCOUNTER — Encounter (HOSPITAL_COMMUNITY): Payer: Medicare Other

## 2013-01-19 ENCOUNTER — Encounter (HOSPITAL_COMMUNITY)
Admission: RE | Admit: 2013-01-19 | Discharge: 2013-01-19 | Disposition: A | Discharge status: Home / Self Care | Payer: Medicare Other | Source: Ambulatory Visit | Attending: Cardiovascular Disease | Admitting: Cardiovascular Disease

## 2013-01-19 ENCOUNTER — Encounter (HOSPITAL_COMMUNITY): Payer: Medicare Other

## 2013-01-22 ENCOUNTER — Encounter (HOSPITAL_COMMUNITY): Payer: Medicare Other

## 2013-01-22 ENCOUNTER — Encounter (HOSPITAL_COMMUNITY)
Admission: RE | Admit: 2013-01-22 | Discharge: 2013-01-22 | Disposition: A | Discharge status: Home / Self Care | Payer: Medicare Other | Source: Ambulatory Visit | Attending: Cardiovascular Disease | Admitting: Cardiovascular Disease

## 2013-01-24 ENCOUNTER — Encounter (HOSPITAL_COMMUNITY)
Admission: RE | Admit: 2013-01-24 | Discharge: 2013-01-24 | Disposition: A | Discharge status: Home / Self Care | Payer: Medicare Other | Source: Ambulatory Visit | Attending: Cardiovascular Disease | Admitting: Cardiovascular Disease

## 2013-01-24 ENCOUNTER — Encounter (HOSPITAL_COMMUNITY): Payer: Medicare Other

## 2013-01-26 ENCOUNTER — Encounter (HOSPITAL_COMMUNITY)
Admission: RE | Admit: 2013-01-26 | Discharge: 2013-01-26 | Disposition: A | Discharge status: Home / Self Care | Payer: Medicare Other | Source: Ambulatory Visit | Attending: Cardiovascular Disease | Admitting: Cardiovascular Disease

## 2013-01-26 ENCOUNTER — Encounter (HOSPITAL_COMMUNITY): Payer: Medicare Other

## 2013-01-29 ENCOUNTER — Encounter (HOSPITAL_COMMUNITY): Payer: Medicare Other

## 2013-01-30 ENCOUNTER — Other Ambulatory Visit (HOSPITAL_COMMUNITY): Payer: Self-pay | Admitting: Physician Assistant

## 2013-01-31 ENCOUNTER — Encounter (HOSPITAL_COMMUNITY): Payer: Medicare Other

## 2013-01-31 ENCOUNTER — Encounter (HOSPITAL_COMMUNITY)
Admission: RE | Admit: 2013-01-31 | Discharge: 2013-01-31 | Disposition: A | Discharge status: Home / Self Care | Payer: Medicare Other | Source: Ambulatory Visit | Attending: Cardiovascular Disease | Admitting: Cardiovascular Disease

## 2013-01-31 DIAGNOSIS — I2510 Atherosclerotic heart disease of native coronary artery without angina pectoris: Secondary | ICD-10-CM | POA: Insufficient documentation

## 2013-01-31 DIAGNOSIS — Z9861 Coronary angioplasty status: Secondary | ICD-10-CM | POA: Insufficient documentation

## 2013-01-31 DIAGNOSIS — Z5189 Encounter for other specified aftercare: Secondary | ICD-10-CM | POA: Insufficient documentation

## 2013-01-31 DIAGNOSIS — I10 Essential (primary) hypertension: Secondary | ICD-10-CM | POA: Insufficient documentation

## 2013-02-02 ENCOUNTER — Encounter (HOSPITAL_COMMUNITY): Payer: Medicare Other

## 2013-02-05 ENCOUNTER — Encounter (HOSPITAL_COMMUNITY)
Admission: RE | Admit: 2013-02-05 | Discharge: 2013-02-05 | Disposition: A | Discharge status: Home / Self Care | Payer: Medicare Other | Source: Ambulatory Visit | Attending: Cardiovascular Disease | Admitting: Cardiovascular Disease

## 2013-02-05 ENCOUNTER — Encounter (HOSPITAL_COMMUNITY): Payer: Medicare Other

## 2013-02-07 ENCOUNTER — Encounter (HOSPITAL_COMMUNITY): Payer: Medicare Other

## 2013-02-09 ENCOUNTER — Encounter (HOSPITAL_COMMUNITY): Payer: Medicare Other

## 2013-02-09 ENCOUNTER — Encounter (HOSPITAL_COMMUNITY)
Admission: RE | Admit: 2013-02-09 | Discharge: 2013-02-09 | Disposition: A | Discharge status: Home / Self Care | Payer: Medicare Other | Source: Ambulatory Visit | Attending: Cardiovascular Disease | Admitting: Cardiovascular Disease

## 2013-02-12 ENCOUNTER — Encounter (HOSPITAL_COMMUNITY): Payer: Medicare Other

## 2013-02-14 ENCOUNTER — Encounter (HOSPITAL_COMMUNITY): Payer: Medicare Other

## 2013-02-14 ENCOUNTER — Encounter (HOSPITAL_COMMUNITY)
Admission: RE | Admit: 2013-02-14 | Discharge: 2013-02-14 | Disposition: A | Discharge status: Home / Self Care | Payer: Medicare Other | Source: Ambulatory Visit | Attending: Cardiovascular Disease | Admitting: Cardiovascular Disease

## 2013-02-14 NOTE — Progress Notes (Signed)
Gardiner Ramus 79 y.o. female Nutrition Note Spoke with pt.  Nutrition Plan and Nutrition Survey goals reviewed with pt. Pt is following Step 2 of the Therapeutic Lifestyle Changes diet. Pt wants to lose wt. Wt loss tips reviewed. Pt expressed understanding of the information reviewed. Pt aware of nutrition education classes offered and plans on attending nutrition classes.  Nutrition Diagnosis   Food-and nutrition-related knowledge deficit related to lack of exposure to information as related to diagnosis of: ? CVD    Overweight related to excessive energy intake as evidenced by a BMI of 29.5  Nutrition RX/ Estimated Daily Nutrition Needs for: wt loss 1200-1450 Kcal, 30-40 gm fat, 9-11 gm sat fat, 1.1-1.4 gm trans-fat, <1500 mg sodium   Nutrition Intervention   Pt's individual nutrition plan including cholesterol goals reviewed with pt.   Benefits of adopting Therapeutic Lifestyle Changes discussed when Medficts reviewed.   Pt to attend the Portion Distortion class - met 01/31/13   Pt to attend the  ? Nutrition I class                     ? Nutrition II class - met 01/09/13   Continue client-centered nutrition education by RD, as part of interdisciplinary care.  Goal(s)   Pt to identify food quantities necessary to achieve: ? wt loss to a goal wt of 142-154 lb (64.7-70.1 kg) at graduation from cardiac rehab.   Monitor and Evaluate progress toward nutrition goal with team. Nutrition Risk:  Low   Mickle Plumb, M.Ed, RD, LDN, CDE 02/14/2013 3:45 PM

## 2013-02-16 ENCOUNTER — Encounter (HOSPITAL_COMMUNITY)
Admission: RE | Admit: 2013-02-16 | Discharge: 2013-02-16 | Disposition: A | Discharge status: Home / Self Care | Payer: Medicare Other | Source: Ambulatory Visit | Attending: Cardiovascular Disease | Admitting: Cardiovascular Disease

## 2013-02-16 ENCOUNTER — Encounter (HOSPITAL_COMMUNITY): Payer: Medicare Other

## 2013-02-19 ENCOUNTER — Encounter (HOSPITAL_COMMUNITY): Payer: Medicare Other

## 2013-02-19 ENCOUNTER — Encounter (HOSPITAL_COMMUNITY)
Admission: RE | Admit: 2013-02-19 | Discharge: 2013-02-19 | Disposition: A | Discharge status: Home / Self Care | Payer: Medicare Other | Source: Ambulatory Visit | Attending: Cardiovascular Disease | Admitting: Cardiovascular Disease

## 2013-02-21 ENCOUNTER — Encounter (HOSPITAL_COMMUNITY): Payer: Medicare Other

## 2013-02-21 ENCOUNTER — Encounter (HOSPITAL_COMMUNITY)
Admission: RE | Admit: 2013-02-21 | Discharge: 2013-02-21 | Disposition: A | Discharge status: Home / Self Care | Payer: Medicare Other | Source: Ambulatory Visit | Attending: Cardiovascular Disease | Admitting: Cardiovascular Disease

## 2013-02-23 ENCOUNTER — Encounter (HOSPITAL_COMMUNITY)
Admission: RE | Admit: 2013-02-23 | Discharge: 2013-02-23 | Disposition: A | Discharge status: Home / Self Care | Payer: Medicare Other | Source: Ambulatory Visit | Attending: Cardiovascular Disease | Admitting: Cardiovascular Disease

## 2013-02-23 ENCOUNTER — Encounter (HOSPITAL_COMMUNITY): Payer: Medicare Other

## 2013-02-26 ENCOUNTER — Encounter (HOSPITAL_COMMUNITY): Payer: Medicare Other

## 2013-02-26 ENCOUNTER — Encounter (HOSPITAL_COMMUNITY)
Admission: RE | Admit: 2013-02-26 | Discharge: 2013-02-26 | Disposition: A | Discharge status: Home / Self Care | Payer: Medicare Other | Source: Ambulatory Visit | Attending: Cardiovascular Disease | Admitting: Cardiovascular Disease

## 2013-02-28 ENCOUNTER — Encounter (HOSPITAL_COMMUNITY)
Admission: RE | Admit: 2013-02-28 | Discharge: 2013-02-28 | Disposition: A | Discharge status: Home / Self Care | Payer: Medicare Other | Source: Ambulatory Visit | Attending: Cardiovascular Disease | Admitting: Cardiovascular Disease

## 2013-02-28 ENCOUNTER — Telehealth: Payer: Self-pay | Admitting: *Deleted

## 2013-02-28 ENCOUNTER — Encounter (HOSPITAL_COMMUNITY): Payer: Medicare Other

## 2013-02-28 DIAGNOSIS — I2510 Atherosclerotic heart disease of native coronary artery without angina pectoris: Secondary | ICD-10-CM | POA: Insufficient documentation

## 2013-02-28 DIAGNOSIS — Z9861 Coronary angioplasty status: Secondary | ICD-10-CM | POA: Insufficient documentation

## 2013-02-28 DIAGNOSIS — I10 Essential (primary) hypertension: Secondary | ICD-10-CM | POA: Insufficient documentation

## 2013-02-28 DIAGNOSIS — Z5189 Encounter for other specified aftercare: Secondary | ICD-10-CM | POA: Insufficient documentation

## 2013-02-28 NOTE — Progress Notes (Signed)
Patient reports having chest discomfort with exertion today getting off the elevator.  Sabrina Hart rated the discomfort a 6 on a 1-10 scale.  Sabrina Hart said the discomfort resolved with rest.  Sabrina Hart said she took a sublingual nitroglycerin Sunday at church and yesterday.  Dr Harvie Bridge office called and notified. Spoke with Sabrina Hart.  Sabrina Hart talked with [facility] via telephone.  Sabrina Hart has an appointment to see Dr Elease Hashimoto on Friday morning.  Sabrina Hart will not exercise today. Basically, Sabrina Hart reports having intermittent chest discomfort since her stent placement she is nonspecific to the dates and times, Except for recently. Unfortunately the cardiac rehab staff was not aware of Sabrina Hart's symptoms until today.Will fax exercise flow sheets to Dr. Harvie Bridge office for review with today's ECG tracing.  Patient knows to report to the emergency room if symptoms return.   Sabrina Hart was pain free upon exit from cardiac rehab today.

## 2013-02-28 NOTE — Telephone Encounter (Signed)
INTERMITTENT CP 6/10 WITH AMBULATION STOPS WITH REST,  EKG LOOKS OK PER [facility],  NO PAIN CURRENTLY. LAST NITRO YESTERDAY X1 PAIN 7/10 AT THAT TIME. NO JAW PAIN OCC ARM PAIN, PT SAID IT HAS BEEN GOING ON FOR AWHILE. APP MADE FOR 4/4 @ 8;30 AM, PT TOLD TO GO TO ED WITH FURTHER SYMPTOMS, PT VERBALIZED UNDERSTANDING.

## 2013-03-01 NOTE — Telephone Encounter (Signed)
Will see patient tomorrow.

## 2013-03-02 ENCOUNTER — Encounter: Payer: Self-pay | Admitting: *Deleted

## 2013-03-02 ENCOUNTER — Encounter: Payer: Self-pay | Admitting: Cardiovascular Disease

## 2013-03-02 ENCOUNTER — Encounter (HOSPITAL_COMMUNITY): Payer: Medicare Other

## 2013-03-02 ENCOUNTER — Ambulatory Visit (INDEPENDENT_AMBULATORY_CARE_PROVIDER_SITE_OTHER): Payer: Medicare Other | Admitting: Cardiovascular Disease

## 2013-03-02 VITALS — BP 140/66 | HR 73 | Ht 63.0 in | Wt 160.8 lb

## 2013-03-02 DIAGNOSIS — Z01810 Encounter for preprocedural cardiovascular examination: Secondary | ICD-10-CM

## 2013-03-02 DIAGNOSIS — I200 Unstable angina: Secondary | ICD-10-CM

## 2013-03-02 DIAGNOSIS — I2510 Atherosclerotic heart disease of native coronary artery without angina pectoris: Secondary | ICD-10-CM

## 2013-03-02 LAB — BASIC METABOLIC PANEL
Chloride: 102 mEq/L (ref 96–112)
Creatinine, Ser: 0.7 mg/dL (ref 0.4–1.2)

## 2013-03-02 LAB — CBC
MCV: 88.6 fl (ref 78.0–100.0)
RBC: 4.33 Mil/uL (ref 3.87–5.11)
WBC: 4.8 10*3/uL (ref 4.5–10.5)

## 2013-03-02 LAB — PROTIME-INR
INR: 1 ratio (ref 0.8–1.0)
Prothrombin Time: 10.8 s (ref 10.2–12.4)

## 2013-03-02 NOTE — Patient Instructions (Addendum)
Your physician recommends that you return for lab work in: today cbc/bmet/pt/inr  HEART CATH HAS BEEN SCHEDULED, SEE LETTER

## 2013-03-02 NOTE — Progress Notes (Signed)
  Sabrina Hart Date of Birth  06/02/1932       [place] [facility]    [place] [facility] [address]  [address], [address] , [place]  [address]   [place], [place]  [address] [phone]     [phone]   Fax  [phone]    Fax [phone]  Problem List: 1. CAD:   09/28/12  She has mild disease of her LAD. The first diagonal artery has a focal 70% stenosis in the distal aspect of the vessel. The right coronary artery had a mid/distal 90% stenosis.  He had placement of a 2.75 x 28 mm drug-eluting stent ( Promus Element)  placed in the mid/distal right coronary artery.  2. hypothyroidism 3. Hyperlipidemia  History of Present Illness:  Sabrina Hart is a 80 yo with hx of CAD - s/p PCI of her RCA  .  She has had som vaginal bleeding and stopped her Brilinta and ASA.  She skipped 1 day and then restarted.  She denies any chest pain.   She has not been exercising but is interested in starting back.  She had numerous questions about how much she can exercise.  She is walking down the street and uses "the number of mailboxes that they pass" as a marker for distance. She was started on Imdur but has had some urinary irritation and wants to stop it.   March 02, 2013:   Sabrina Hart developed  CP while exercising in cardiac rehab.  Exposure to cold air, cold water, eating too much, stress can all bring on these episodes of chest pain.  The pain was relieved with SL NTG.  The pains last several minutes.  She has occasionally had these episodes at rest.   The pain radiates to her arms and is almost exactly like her previous episodes of angina prior to her stent.  It is not as bad.   She is having lots of bruising from the ASA and Brilinta.  Current Outpatient Prescriptions on File Prior to Visit  Medication Sig Dispense Refill  . aspirin EC 81 MG EC tablet Take 1 tablet (81 mg total) by mouth daily.      . dorzolamide-timolol (COSOPT) 22.3-6.8 MG/ML ophthalmic solution Place 1  drop into both eyes 2 (two) times daily.      . isosorbide mononitrate (IMDUR) 30 MG 24 hr tablet Take 1 tablet (30 mg total) by mouth daily.  60 tablet  6  . levothyroxine (SYNTHROID, LEVOTHROID) 125 MCG tablet Take 125 mcg by mouth daily.      . metoprolol tartrate (LOPRESSOR) 25 MG tablet TAKE 1 TABLET BY MOUTH TWICE A DAY  60 tablet  3  . nitroGLYCERIN (NITROSTAT) 0.4 MG SL tablet Place 1 tablet (0.4 mg total) under the tongue every 5 (five) minutes as needed for chest pain.  25 tablet  3  . Ticagrelor (BRILINTA) 90 MG TABS tablet Take 1 tablet (90 mg total) by mouth 2 (two) times daily.  60 tablet  3   No current facility-administered medications on file prior to visit.    Allergies  Allergen Reactions  . Other Other (See Comments)    Metals; "skin gets weepy; gets worse if contact >s" (09/28/2012)  . Pollen Extract Other (See Comments)    "sneezing; watery eyes"  . Latex Itching and Swelling  . Morphine And Related Rash  . Penicillins Rash    Past Medical History  Diagnosis Date  . Glaucoma   .   Coronary artery disease Sep 27, 2012    s/p PCI of the RCA with DES with remote PCI in [place] 15 to 20 years ago.   . Hypothyroidism   . Hyperlipidemia   . Anginal pain     1990's; 09/28/2012  . Arthritis     "very mild" (09/28/2012)    Past Surgical History  Procedure Laterality Date  . Cataract extraction w/ intraocular lens  implant, bilateral  1970's  . Lumbar disc surgery  1980's    "took out a little piece" (09/28/2012)  . Reduction mammaplasty  1970's  . Coronary angioplasty with stent placement  09/28/2012    20-30% diffuse LAD stenosis, mild-mod D1 dz, 70% distal D1 lesion, mild diffuse LCx dz, 50-60% OM1 stenosis, 60% mid & 90% mid-dis RCA stenosis s/p DES; LVEF 75-80% w/ hyperdynamic fxn.   . Dental surgery      "implants"  . Inner ear surgery  ~ 2010    "put plugs in so I could fly" (09/28/2012)  . Cardiac catheterization  1990s    PCI    History    Smoking status  . Never Smoker   Smokeless tobacco  . Never Used    History  Alcohol Use  . 0.6 oz/week  . 1 Glasses of wine per week    Comment: 09/28/2012 "1 glass of wine averages once/wk; beer average once/week during summer w/friends"    No family history on file.  Reviw of Systems:  Reviewed in the HPI.  All other systems are negative.  Physical Exam: Blood pressure 140/66, pulse 73, height 5' 3" (1.6 m), weight 160 lb 12.8 oz (72.938 kg), SpO2 98.00%. General: Well developed, well nourished, in no acute distress.  Head: Normocephalic, atraumatic, sclera non-icteric, mucus membranes are moist,   Neck: Supple. Carotids are 2 + without bruits. No JVD   Lungs: Clear   Heart: RR, normal S1 and S2  Abdomen: Soft, non-tender, non-distended with normal bowel sounds.  Msk:  Strength and tone are normal   Extremities: No clubbing or cyanosis. Trace edema - left > right.  Distal pedal pulses are 2+ and equal   Neuro: CN II - XII intact.  Alert and oriented X 3.   Psych:  Normal   ECG: 03/02/2013:  Normal sinus rhythm at 60 beats a minute. She has no ST or T wave changes. Assessment / Plan:   

## 2013-03-02 NOTE — Assessment & Plan Note (Signed)
She has history of coronary disease and now presents with symptoms consistent with unstable angina. We'll proceed with cardiac catheterization next week. We discussed the risks, benefits, and options of cardiac catheterization. She understands and agrees to proceed.

## 2013-03-02 NOTE — Assessment & Plan Note (Signed)
Sabrina Hart presents with progressive angina. These episodes of chest pain at the present for the past month or so. They typically will occur if she is walking too fast. They will also occur if she drinks cold liquids go down to the cold air. The pains are relieved by stopping and resting or by taking a nitroglycerin. They last for several minutes. They radiate to her arms. The pains are almost exactly like her symptoms of angina prior to her stenting.  At this point I think we should repeat her cardiac catheterization. She had stent placement to her mid right coronary artery in the past. She was also found to have some other irregularities.  We discussed the risks, benefits, and options regarding heart catheterization. She understands and agrees to proceed. She will continue with her medications. She is having some mild bruising with the aspirin overloaded but it is not excessive.

## 2013-03-05 ENCOUNTER — Encounter (HOSPITAL_COMMUNITY): Admission: RE | Admit: 2013-03-05 | Payer: Medicare Other | Source: Ambulatory Visit

## 2013-03-05 ENCOUNTER — Encounter (HOSPITAL_COMMUNITY): Payer: Medicare Other

## 2013-03-06 NOTE — Addendum Note (Signed)
Addended by: Debbe Bales on: 03/06/2013 04:34 PM   Modules accepted: Orders

## 2013-03-07 ENCOUNTER — Encounter (HOSPITAL_COMMUNITY): Payer: Medicare Other

## 2013-03-09 ENCOUNTER — Encounter (HOSPITAL_COMMUNITY): Payer: Medicare Other

## 2013-03-09 ENCOUNTER — Encounter (HOSPITAL_BASED_OUTPATIENT_CLINIC_OR_DEPARTMENT_OTHER)
Admission: RE | Disposition: A | Discharge status: Home / Self Care | Payer: Self-pay | Source: Ambulatory Visit | Attending: Cardiology

## 2013-03-09 ENCOUNTER — Inpatient Hospital Stay (HOSPITAL_BASED_OUTPATIENT_CLINIC_OR_DEPARTMENT_OTHER)
Admission: RE | Admit: 2013-03-09 | Discharge: 2013-03-09 | Disposition: A | Discharge status: Home / Self Care | Payer: Medicare Other | Source: Ambulatory Visit | Attending: Cardiology | Admitting: Cardiology

## 2013-03-09 DIAGNOSIS — R079 Chest pain, unspecified: Secondary | ICD-10-CM | POA: Insufficient documentation

## 2013-03-09 DIAGNOSIS — I200 Unstable angina: Secondary | ICD-10-CM

## 2013-03-09 DIAGNOSIS — I2510 Atherosclerotic heart disease of native coronary artery without angina pectoris: Secondary | ICD-10-CM | POA: Insufficient documentation

## 2013-03-09 DIAGNOSIS — Z9861 Coronary angioplasty status: Secondary | ICD-10-CM | POA: Insufficient documentation

## 2013-03-09 DIAGNOSIS — E039 Hypothyroidism, unspecified: Secondary | ICD-10-CM | POA: Insufficient documentation

## 2013-03-09 DIAGNOSIS — E785 Hyperlipidemia, unspecified: Secondary | ICD-10-CM | POA: Insufficient documentation

## 2013-03-09 SURGERY — JV LEFT HEART CATHETERIZATION WITH CORONARY ANGIOGRAM
Anesthesia: Moderate Sedation

## 2013-03-09 MED ORDER — DIAZEPAM 5 MG PO TABS: 5.0000 mg | ORAL_TABLET | ORAL | Status: DC

## 2013-03-09 MED ORDER — SODIUM CHLORIDE 0.9 % IV SOLN
INTRAVENOUS | Status: DC
  Administered 2013-03-09: 09:00:00 via INTRAVENOUS

## 2013-03-09 MED ORDER — SODIUM CHLORIDE 0.9 % IJ SOLN: 3.0000 mL | Freq: Two times a day (BID) | INTRAMUSCULAR | Status: DC

## 2013-03-09 MED ORDER — SODIUM CHLORIDE 0.9 % IV SOLN: 250.0000 mL | INTRAVENOUS | Status: DC | PRN

## 2013-03-09 MED ORDER — SODIUM CHLORIDE 0.9 % IJ SOLN: 3.0000 mL | INTRAMUSCULAR | Status: DC | PRN

## 2013-03-09 NOTE — Progress Notes (Signed)
Allen's test performed on right hand with negative results. 

## 2013-03-09 NOTE — Interval H&P Note (Signed)
History and Physical Interval Note:  03/09/2013 9:34 AM  Sabrina Hart  has presented today for surgery, with the diagnosis of chest pain  The various methods of treatment have been discussed with the patient and family. After consideration of risks, benefits and other options for treatment, the patient has consented to  Procedure(s): JV LEFT HEART CATHETERIZATION WITH CORONARY ANGIOGRAM (N/A) as a surgical intervention .  The patient's history has been reviewed, patient examined, no change in status, stable for surgery.  I have reviewed the patient's chart and labs.  Questions were answered to the patient's satisfaction.     [name]

## 2013-03-09 NOTE — Progress Notes (Signed)
Bedrest begins @ 1020.  Tegaderm dressing applied to right groin site by Theodoro Grist, site level 0.

## 2013-03-09 NOTE — H&P (View-Only) (Signed)
Sabrina Hart Date of Birth  [birth year]       [facility]    [place] [address]  [address] [place], [place]  [address]   [place], [place]  [address] [phone]     [phone]   Fax  [phone]    Fax [phone]  Problem List: 1. CAD:   09/28/12  She has mild disease of her LAD. The first diagonal artery has a focal 70% stenosis in the distal aspect of the vessel. The right coronary artery had a mid/distal 90% stenosis.  He had placement of a 2.75 x 28 mm drug-eluting stent ( Promus Element)  placed in the mid/distal right coronary artery.  2. hypothyroidism 3. Hyperlipidemia  History of Present Illness:  Sabrina Hart is a 79 yo with hx of CAD - s/p PCI of her RCA  .  She has had som vaginal bleeding and stopped her Brilinta and ASA.  She skipped 1 day and then restarted.  She denies any chest pain.   She has not been exercising but is interested in starting back.  She had numerous questions about how much she can exercise.  She is walking down the street and uses "the number of mailboxes that they pass" as a marker for distance. She was started on Imdur but has had some urinary irritation and wants to stop it.   March 02, 2013:   Sabrina Hart developed  CP while exercising in cardiac rehab.  Exposure to cold air, cold water, eating too much, stress can all bring on these episodes of chest pain.  The pain was relieved with SL NTG.  The pains last several minutes.  She has occasionally had these episodes at rest.   The pain radiates to her arms and is almost exactly like her previous episodes of angina prior to her stent.  It is not as bad.   She is having lots of bruising from the ASA and Brilinta.  Current Outpatient Prescriptions on File Prior to Visit  Medication Sig Dispense Refill  . aspirin EC 81 MG EC tablet Take 1 tablet (81 mg total) by mouth daily.      . dorzolamide-timolol (COSOPT) 22.3-6.8 MG/ML ophthalmic solution Place 1  drop into both eyes 2 (two) times daily.      . isosorbide mononitrate (IMDUR) 30 MG 24 hr tablet Take 1 tablet (30 mg total) by mouth daily.  60 tablet  6  . levothyroxine (SYNTHROID, LEVOTHROID) 125 MCG tablet Take 125 mcg by mouth daily.      . metoprolol tartrate (LOPRESSOR) 25 MG tablet TAKE 1 TABLET BY MOUTH TWICE A DAY  60 tablet  3  . nitroGLYCERIN (NITROSTAT) 0.4 MG SL tablet Place 1 tablet (0.4 mg total) under the tongue every 5 (five) minutes as needed for chest pain.  25 tablet  3  . Ticagrelor (BRILINTA) 90 MG TABS tablet Take 1 tablet (90 mg total) by mouth 2 (two) times daily.  60 tablet  3   No current facility-administered medications on file prior to visit.    Allergies  Allergen Reactions  . Other Other (See Comments)    Metals; "skin gets weepy; gets worse if contact >s" (09/28/2012)  . Pollen Extract Other (See Comments)    "sneezing; watery eyes"  . Latex Itching and Swelling  . Morphine And Related Rash  . Penicillins Rash    Past Medical History  Diagnosis Date  . Glaucoma   .  Coronary artery disease Sep 27, 2012    s/p PCI of the RCA with DES with remote PCI in [place] 15 to 20 years ago.   Marland Kitchen Hypothyroidism   . Hyperlipidemia   . Anginal pain     1990's; 09/28/2012  . Arthritis     "very mild" (09/28/2012)    Past Surgical History  Procedure Laterality Date  . Cataract extraction w/ intraocular lens  implant, bilateral  1970's  . Lumbar disc surgery  1980's    "took out a little piece" (09/28/2012)  . Reduction mammaplasty  1970's  . Coronary angioplasty with stent placement  09/28/2012    20-30% diffuse LAD stenosis, mild-mod D1 dz, 70% distal D1 lesion, mild diffuse LCx dz, 50-60% OM1 stenosis, 60% mid & 90% mid-dis RCA stenosis s/p DES; LVEF 75-80% w/ hyperdynamic fxn.   . Dental surgery      "implants"  . Inner ear surgery  ~ 2010    "put plugs in so I could fly" (09/28/2012)  . Cardiac catheterization  1990s    PCI    History    Smoking status  . Never Smoker   Smokeless tobacco  . Never Used    History  Alcohol Use  . 0.6 oz/week  . 1 Glasses of wine per week    Comment: 09/28/2012 "1 glass of wine averages once/wk; beer average once/week during summer w/friends"    No family history on file.  Reviw of Systems:  Reviewed in the HPI.  All other systems are negative.  Physical Exam: Blood pressure 140/66, pulse 73, height 5\' 3"  (1.6 m), weight 160 lb 12.8 oz (72.938 kg), SpO2 98.00%. General: Well developed, well nourished, in no acute distress.  Head: Normocephalic, atraumatic, sclera non-icteric, mucus membranes are moist,   Neck: Supple. Carotids are 2 + without bruits. No JVD   Lungs: Clear   Heart: RR, normal S1 and S2  Abdomen: Soft, non-tender, non-distended with normal bowel sounds.  Msk:  Strength and tone are normal   Extremities: No clubbing or cyanosis. Trace edema - left > right.  Distal pedal pulses are 2+ and equal   Neuro: CN II - XII intact.  Alert and oriented X 3.   Psych:  Normal   ECG: 03/02/2013:  Normal sinus rhythm at 60 beats a minute. She has no ST or T wave changes. Assessment / Plan:

## 2013-03-09 NOTE — CV Procedure (Signed)
  Cardiac Catheterization Procedure Note  Name: Sabrina Hart MRN: [id number] DOB: 16-Nov-1932  Procedure: Left Heart Cath, Selective Coronary Angiography, LV angiography  Indication:   Chest pain consistent with previous MI (Class IV angina) with previous stenting of the RCA.  Procedural details: The right groin was prepped, draped, and anesthetized with 1% lidocaine. Using modified Seldinger technique, a 4French sheath was introduced into the right femoral artery. Standard Judkins catheters were used for coronary angiography and left ventriculography. Catheter exchanges were performed over a guidewire. There were no immediate procedural complications. The patient was transferred to the post catheterization recovery area for further monitoring.  Procedural Findings:  Hemodynamics:     AO 118/77    LV 127/21   Coronary angiography:   Coronary dominance: Right  Left mainstem:   Short calcified with 30% stenosis.  Left anterior descending (LAD):   Wraps the apex.  Diffuse luminal irregularities.  Mid to distal tandem 70% focal lesions x 3.  Diagonal large with mid 60% stenosis.    Left circumflex (LCx):  AV groove diffuse luminal irregularities.  MOM moderate sized with ostial 50% stenosis and mid long 40% stenosis.    Right coronary artery (RCA):  Large and dominant with diffuse 25% stenosis.  Mid stent widely patent.  PDA moderate sized and normal.  PL large and normal.    Left ventriculography: Left ventricular systolic function with mild inferior hypokinesis.   LVEF is estimated at 60%, there is no significant mitral regurgitation   Final Conclusions:  Nonobstructive and small vessel disease with a patent RCA stent.    Recommendations:   I would suggest continued medical management.  If she continues to have pain I would consider stress perfusion imaging to evaluate the possible hemodynamic significance of the LAD.  [name] 03/09/2013, 10:01 AM

## 2013-03-15 ENCOUNTER — Telehealth (HOSPITAL_COMMUNITY): Payer: Self-pay | Admitting: *Deleted

## 2013-03-19 ENCOUNTER — Encounter (HOSPITAL_COMMUNITY): Payer: Medicare Other

## 2013-03-27 ENCOUNTER — Encounter: Payer: Self-pay | Admitting: Cardiovascular Disease

## 2013-03-27 ENCOUNTER — Ambulatory Visit (INDEPENDENT_AMBULATORY_CARE_PROVIDER_SITE_OTHER): Payer: Medicare Other | Admitting: Cardiovascular Disease

## 2013-03-27 ENCOUNTER — Other Ambulatory Visit: Payer: Self-pay

## 2013-03-27 VITALS — BP 124/64 | HR 65 | Ht 63.0 in | Wt 154.1 lb

## 2013-03-27 DIAGNOSIS — E785 Hyperlipidemia, unspecified: Secondary | ICD-10-CM

## 2013-03-27 LAB — HEPATIC FUNCTION PANEL
ALT: 20 U/L (ref 0–35)
AST: 25 U/L (ref 0–37)
Alkaline Phosphatase: 73 U/L (ref 39–117)
Bilirubin, Direct: 0 mg/dL (ref 0.0–0.3)
Total Bilirubin: 1.3 mg/dL — ABNORMAL HIGH (ref 0.3–1.2)
Total Protein: 7.1 g/dL (ref 6.0–8.3)

## 2013-03-27 LAB — BASIC METABOLIC PANEL
BUN: 11 mg/dL (ref 6–23)
CO2: 29 mEq/L (ref 19–32)
Calcium: 9 mg/dL (ref 8.4–10.5)
Creatinine, Ser: 0.8 mg/dL (ref 0.4–1.2)
GFR: 84.15 mL/min (ref >60.00)
Glucose, Bld: 90 mg/dL (ref 70–99)
Sodium: 139 mEq/L (ref 135–145)

## 2013-03-27 MED ORDER — ISOSORBIDE MONONITRATE ER 30 MG PO TB24: 30.0000 mg | ORAL_TABLET | Freq: Every day | ORAL | Status: DC

## 2013-03-27 NOTE — Progress Notes (Signed)
Sabrina Hart Date of Birth  02/06/1932       [facility]    [place] [address]  [address] [place], [place]  [address]   [place], [place]  [address] [phone]     [phone]   Fax  [phone]    Fax [phone]  Problem List: 1. CAD:   09/28/12  She has mild disease of her LAD. The first diagonal artery has a focal 70% stenosis in the distal aspect of the vessel. The right coronary artery had a mid/distal 90% stenosis.  He had placement of a 2.75 x 28 mm drug-eluting stent ( Promus Element)  placed in the mid/distal right coronary artery.  2. hypothyroidism 3. Hyperlipidemia  History of Present Illness:  Sabrina Hart is a 79 yo with hx of CAD - s/p PCI of her RCA  .  She has had som vaginal bleeding and stopped her Brilinta and ASA.  She skipped 1 day and then restarted.  She denies any chest pain.   She has not been exercising but is interested in starting back.  She had numerous questions about how much she can exercise.  She is walking down the street and uses "the number of mailboxes that they pass" as a marker for distance. She was started on Imdur but has had some urinary irritation and wants to stop it.   March 02, 2013:   Sabrina Hart developed  CP while exercising in cardiac rehab.  Exposure to cold air, cold water, eating too much, stress can all bring on these episodes of chest pain.  The pain was relieved with SL NTG.  The pains last several minutes.  She has occasionally had these episodes at rest.   The pain radiates to her arms and is almost exactly like her previous episodes of angina prior to her stent.  It is not as bad.   She is having lots of bruising from the ASA and Brilinta.  Aril 29, 2014:  When I saw her several weeks ago she was having recurrent episodes of chest pain. Cardiac catheterization revealed a patent right coronary stent. She has moderate to severe disease in the distal LAD.  She has been doing well.   No further episodes of CP.    She has had several episodes of severe lightheadedness.  This has occurred since increasing her dose of Imdur.  I suspect she is getting orthstatic hypotension.  Current Outpatient Prescriptions on File Prior to Visit  Medication Sig Dispense Refill  . aspirin EC 81 MG EC tablet Take 1 tablet (81 mg total) by mouth daily.      . dorzolamide-timolol (COSOPT) 22.3-6.8 MG/ML ophthalmic solution Place 1 drop into both eyes 2 (two) times daily.      Marland Kitchen levothyroxine (SYNTHROID, LEVOTHROID) 125 MCG tablet Take 125 mcg by mouth daily.      . metoprolol tartrate (LOPRESSOR) 25 MG tablet TAKE 1 TABLET BY MOUTH TWICE A DAY  60 tablet  3  . Multiple Vitamin (MULTIVITAMIN) capsule Take 1 capsule by mouth daily.      . niacin 500 MG tablet Take 500 mg by mouth daily with breakfast.      . nitroGLYCERIN (NITROSTAT) 0.4 MG SL tablet Place 1 tablet (0.4 mg total) under the tongue every 5 (five) minutes as needed for chest pain.  25 tablet  3  . NON FORMULARY Tryi- Thyroid for thyroids Daily      .  NON FORMULARY Airline pilot for Vision      . Ticagrelor (BRILINTA) 90 MG TABS tablet Take 1 tablet (90 mg total) by mouth 2 (two) times daily.  60 tablet  3   No current facility-administered medications on file prior to visit.    Allergies  Allergen Reactions  . Other Other (See Comments)    Metals; "skin gets weepy; gets worse if contact >s" (09/28/2012)  . Pollen Extract Other (See Comments)    "sneezing; watery eyes"  . Latex Itching and Swelling  . Morphine And Related Rash  . Penicillins Rash    Past Medical History  Diagnosis Date  . Glaucoma   . Coronary artery disease Sep 27, 2012    s/p PCI of the RCA with DES with remote PCI in [place] 15 to 20 years ago.   Marland Kitchen Hypothyroidism   . Hyperlipidemia   . Anginal pain     1990's; 09/28/2012  . Arthritis     "very mild" (09/28/2012)    Past Surgical History  Procedure Laterality Date  . Cataract  extraction w/ intraocular lens  implant, bilateral  1970's  . Lumbar disc surgery  1980's    "took out a little piece" (09/28/2012)  . Reduction mammaplasty  1970's  . Coronary angioplasty with stent placement  09/28/2012    20-30% diffuse LAD stenosis, mild-mod D1 dz, 70% distal D1 lesion, mild diffuse LCx dz, 50-60% OM1 stenosis, 60% mid & 90% mid-dis RCA stenosis s/p DES; LVEF 75-80% w/ hyperdynamic fxn.   . Dental surgery      "implants"  . Inner ear surgery  ~ 2010    "put plugs in so I could fly" (09/28/2012)  . Cardiac catheterization  1990s    PCI    History  Smoking status  . Never Smoker   Smokeless tobacco  . Never Used    History  Alcohol Use  . 0.6 oz/week  . 1 Glasses of wine per week    Comment: 09/28/2012 "1 glass of wine averages once/wk; beer average once/week during summer w/friends"    No family history on file.  Reviw of Systems:  Reviewed in the HPI.  All other systems are negative.  Physical Exam: Blood pressure 124/64, pulse 65, height 5\' 3"  (1.6 m), weight 154 lb 1.9 oz (69.908 kg), SpO2 98.00%. General: Well developed, well nourished, in no acute distress.  Head: Normocephalic, atraumatic, sclera non-icteric, mucus membranes are moist,   Neck: Supple. Carotids are 2 + without bruits. No JVD   Lungs: Clear   Heart: RR, normal S1 and S2  Abdomen: Soft, non-tender, non-distended with normal bowel sounds.  Msk:  Strength and tone are normal   Extremities: No clubbing or cyanosis. Trace edema - left > right.  Distal pedal pulses are 2+ and equal   Neuro: CN II - XII intact.  Alert and oriented X 3.   Psych:  Normal   ECG: 03/02/2013:  Normal sinus rhythm at 60 beats a minute. She has no ST or T wave changes. Assessment / Plan:

## 2013-03-27 NOTE — Patient Instructions (Addendum)
Lab work today   Decrease Imdur to 30 mg daily    Your physician wants you to follow-up in: 6 months with fasting lab. You will receive a reminder letter in the mail two months in advance. If you don't receive a letter, please call our office to schedule the follow-up appointment.

## 2013-03-27 NOTE — Assessment & Plan Note (Signed)
Sabrina Hart seems to be doing well from a cardiac standpoint. She's not having episodes of chest pain or shortness breath. She had a cardiac catheterization in mid April which revealed a patent RCA stent. She has moderate to severe disease of the distal LAD.  Her isosorbide was increased from 30 mg a day to 60 mg a day earlier this month.  Since that time she's had symptoms consistent with orthostatic hypotension. She gets blurred vision and has ringing in her ears. It  is also associated with some nausea.  We will decrease her isosorbide 30 mg once a day. I've encouraged her to continue with her other medications. Recheck fasting lipids today. We'll see her again in 6 months for office visit, fasting lipids, hepatic profile, basic metabolic profile, and EKG.

## 2013-03-28 ENCOUNTER — Telehealth (HOSPITAL_COMMUNITY): Payer: Self-pay | Admitting: *Deleted

## 2013-03-28 ENCOUNTER — Telehealth: Payer: Self-pay | Admitting: *Deleted

## 2013-03-28 ENCOUNTER — Encounter (HOSPITAL_COMMUNITY)
Admission: RE | Admit: 2013-03-28 | Payer: Medicare Other | Source: Ambulatory Visit | Attending: Cardiovascular Disease | Admitting: Cardiovascular Disease

## 2013-03-28 DIAGNOSIS — E785 Hyperlipidemia, unspecified: Secondary | ICD-10-CM

## 2013-03-28 MED ORDER — ATORVASTATIN CALCIUM 40 MG PO TABS: 40.0000 mg | ORAL_TABLET | Freq: Every day | ORAL | Status: DC

## 2013-03-28 NOTE — Telephone Encounter (Signed)
Message copied by Burnell Blanks on Wed Mar 28, 2013 11:43 AM ------      Message from: [name]      Created: Tue Mar 27, 2013  6:35 PM       Her LDL cholesterol is 165. She would benefit from starting a statin. start atorvastatin 40 mg a day.  please check fasting lipids, hepatic profile, and basic metabolic profile in 3 months. ------

## 2013-03-28 NOTE — Telephone Encounter (Signed)
Advised patient of lab results and adding medication

## 2013-03-29 ENCOUNTER — Telehealth: Payer: Self-pay | Admitting: Cardiovascular Disease

## 2013-03-29 NOTE — Telephone Encounter (Signed)
Per Dr Elease Hashimoto pt may return to cardiac rehab, I will forward note to them.

## 2013-03-29 NOTE — Telephone Encounter (Signed)
New Prob     Pt is requesting a note from Dr. Elease Hashimoto to return back to cardiac rehab.

## 2013-03-30 ENCOUNTER — Encounter (HOSPITAL_COMMUNITY)
Admission: RE | Admit: 2013-03-30 | Discharge: 2013-03-30 | Disposition: A | Discharge status: Home / Self Care | Payer: Medicare Other | Source: Ambulatory Visit | Attending: Cardiovascular Disease | Admitting: Cardiovascular Disease

## 2013-03-30 DIAGNOSIS — Z5189 Encounter for other specified aftercare: Secondary | ICD-10-CM | POA: Insufficient documentation

## 2013-03-30 DIAGNOSIS — I2510 Atherosclerotic heart disease of native coronary artery without angina pectoris: Secondary | ICD-10-CM | POA: Insufficient documentation

## 2013-03-30 DIAGNOSIS — Z9861 Coronary angioplasty status: Secondary | ICD-10-CM | POA: Insufficient documentation

## 2013-03-30 DIAGNOSIS — I10 Essential (primary) hypertension: Secondary | ICD-10-CM | POA: Insufficient documentation

## 2013-04-02 ENCOUNTER — Encounter (HOSPITAL_COMMUNITY)
Admission: RE | Admit: 2013-04-02 | Discharge: 2013-04-02 | Disposition: A | Discharge status: Home / Self Care | Payer: Medicare Other | Source: Ambulatory Visit | Attending: Cardiovascular Disease | Admitting: Cardiovascular Disease

## 2013-04-04 ENCOUNTER — Encounter (HOSPITAL_COMMUNITY)
Admission: RE | Admit: 2013-04-04 | Discharge: 2013-04-04 | Disposition: A | Discharge status: Home / Self Care | Payer: Medicare Other | Source: Ambulatory Visit | Attending: Cardiovascular Disease | Admitting: Cardiovascular Disease

## 2013-04-06 ENCOUNTER — Encounter (HOSPITAL_COMMUNITY): Payer: Medicare Other

## 2013-04-09 ENCOUNTER — Encounter (HOSPITAL_COMMUNITY)
Admission: RE | Admit: 2013-04-09 | Discharge: 2013-04-09 | Disposition: A | Discharge status: Home / Self Care | Payer: Medicare Other | Source: Ambulatory Visit | Attending: Cardiovascular Disease | Admitting: Cardiovascular Disease

## 2013-04-11 ENCOUNTER — Encounter (HOSPITAL_COMMUNITY)
Admission: RE | Admit: 2013-04-11 | Discharge: 2013-04-11 | Disposition: A | Discharge status: Home / Self Care | Payer: Medicare Other | Source: Ambulatory Visit | Attending: Cardiovascular Disease | Admitting: Cardiovascular Disease

## 2013-04-13 ENCOUNTER — Encounter (HOSPITAL_COMMUNITY)
Admission: RE | Admit: 2013-04-13 | Discharge: 2013-04-13 | Disposition: A | Discharge status: Home / Self Care | Payer: Medicare Other | Source: Ambulatory Visit | Attending: Cardiovascular Disease | Admitting: Cardiovascular Disease

## 2013-04-13 NOTE — Progress Notes (Signed)
[facility] graduates today and plans to continue exercise on her own by walking.

## 2013-06-05 ENCOUNTER — Other Ambulatory Visit (HOSPITAL_COMMUNITY): Payer: Self-pay | Admitting: Cardiovascular Disease

## 2013-06-05 NOTE — Telephone Encounter (Signed)
Fax Received. Refill Completed. Sabrina Hart (R.M.A)   

## 2013-06-27 ENCOUNTER — Other Ambulatory Visit (INDEPENDENT_AMBULATORY_CARE_PROVIDER_SITE_OTHER): Payer: Medicare Other

## 2013-06-27 DIAGNOSIS — E785 Hyperlipidemia, unspecified: Secondary | ICD-10-CM

## 2013-06-27 LAB — HEPATIC FUNCTION PANEL
ALT: 39 U/L — ABNORMAL HIGH (ref 0–35)
AST: 36 U/L (ref 0–37)
Bilirubin, Direct: 0 mg/dL (ref 0.0–0.3)
Total Bilirubin: 0.9 mg/dL (ref 0.3–1.2)

## 2013-06-27 LAB — LIPID PANEL
Cholesterol: 152 mg/dL (ref 0–200)
LDL Cholesterol: 66 mg/dL (ref 0–99)
Total CHOL/HDL Ratio: 2
VLDL: 12.8 mg/dL (ref 0.0–40.0)

## 2013-06-27 LAB — BASIC METABOLIC PANEL
BUN: 12 mg/dL (ref 6–23)
Chloride: 103 mEq/L (ref 96–112)
GFR: 105.53 mL/min (ref >60.00)
Potassium: 4.7 mEq/L (ref 3.5–5.1)

## 2013-07-04 ENCOUNTER — Other Ambulatory Visit: Payer: Self-pay

## 2013-09-10 ENCOUNTER — Other Ambulatory Visit (HOSPITAL_COMMUNITY): Payer: Self-pay | Admitting: Cardiovascular Disease

## 2013-10-01 ENCOUNTER — Other Ambulatory Visit (HOSPITAL_COMMUNITY): Payer: Self-pay | Admitting: Cardiovascular Disease

## 2013-10-04 ENCOUNTER — Other Ambulatory Visit: Payer: Self-pay

## 2013-10-13 ENCOUNTER — Other Ambulatory Visit: Payer: Self-pay | Admitting: Cardiovascular Disease

## 2013-10-19 ENCOUNTER — Other Ambulatory Visit (HOSPITAL_COMMUNITY): Payer: Self-pay | Admitting: Physician Assistant

## 2013-11-14 ENCOUNTER — Other Ambulatory Visit: Payer: Self-pay | Admitting: Cardiovascular Disease

## 2014-01-02 ENCOUNTER — Other Ambulatory Visit: Payer: Self-pay | Admitting: Chiropractic Medicine

## 2014-01-02 ENCOUNTER — Ambulatory Visit
Admission: RE | Admit: 2014-01-02 | Discharge: 2014-01-02 | Disposition: A | Discharge status: Home / Self Care | Payer: Medicare Other | Source: Ambulatory Visit | Attending: Chiropractic Medicine | Admitting: Chiropractic Medicine

## 2014-01-02 DIAGNOSIS — M5412 Radiculopathy, cervical region: Secondary | ICD-10-CM

## 2014-01-02 DIAGNOSIS — M545 Low back pain, unspecified: Secondary | ICD-10-CM

## 2014-01-02 DIAGNOSIS — M5416 Radiculopathy, lumbar region: Secondary | ICD-10-CM

## 2014-01-02 DIAGNOSIS — M2170 Unequal limb length (acquired), unspecified site: Secondary | ICD-10-CM

## 2014-01-03 ENCOUNTER — Telehealth: Payer: Self-pay | Admitting: Cardiovascular Disease

## 2014-01-03 NOTE — Telephone Encounter (Signed)
New message          Pt states she received dr Elease Hashimotonahser call and still intends to come on her scheduled date.

## 2014-01-04 NOTE — Telephone Encounter (Signed)
noted 

## 2014-01-14 ENCOUNTER — Encounter: Payer: Self-pay | Admitting: Cardiovascular Disease

## 2014-01-14 ENCOUNTER — Ambulatory Visit (INDEPENDENT_AMBULATORY_CARE_PROVIDER_SITE_OTHER): Payer: Medicare Other | Admitting: Cardiovascular Disease

## 2014-01-14 VITALS — BP 120/64 | HR 60 | Ht 63.0 in | Wt 155.0 lb

## 2014-01-14 DIAGNOSIS — I2510 Atherosclerotic heart disease of native coronary artery without angina pectoris: Secondary | ICD-10-CM

## 2014-01-14 DIAGNOSIS — E785 Hyperlipidemia, unspecified: Secondary | ICD-10-CM

## 2014-01-14 LAB — BASIC METABOLIC PANEL
BUN: 16 mg/dL (ref 6–23)
CHLORIDE: 102 meq/L (ref 96–112)
CO2: 30 meq/L (ref 19–32)
Calcium: 9.2 mg/dL (ref 8.4–10.5)
Creatinine, Ser: 0.7 mg/dL (ref 0.4–1.2)
GFR: 101.96 mL/min (ref >60.00)
Glucose, Bld: 82 mg/dL (ref 70–99)
Potassium: 3.8 mEq/L (ref 3.5–5.1)
Sodium: 138 mEq/L (ref 135–145)

## 2014-01-14 LAB — HEPATIC FUNCTION PANEL
ALBUMIN: 3.9 g/dL (ref 3.5–5.2)
ALT: 25 U/L (ref 0–35)
AST: 26 U/L (ref 0–37)
Alkaline Phosphatase: 77 U/L (ref 39–117)
BILIRUBIN TOTAL: 0.7 mg/dL (ref 0.3–1.2)
Bilirubin, Direct: 0 mg/dL (ref 0.0–0.3)
Total Protein: 7.1 g/dL (ref 6.0–8.3)

## 2014-01-14 LAB — LIPID PANEL
CHOLESTEROL: 230 mg/dL — AB (ref 0–200)
HDL: 91.1 mg/dL (ref >39.00)
Total CHOL/HDL Ratio: 3
Triglycerides: 66 mg/dL (ref 0.0–149.0)
VLDL: 13.2 mg/dL (ref 0.0–40.0)

## 2014-01-14 LAB — LDL CHOLESTEROL, DIRECT: Direct LDL: 127.7 mg/dL

## 2014-01-14 NOTE — Progress Notes (Signed)
Gardiner Ramus Date of Birth  [birth year]       [facility]    [place] [address]  [address] [place], [place]  [address]   [place], [place]  [address] [phone]     [phone]   Fax  [phone]    Fax [phone]  Problem List: 1. CAD:   09/28/12  She has mild disease of her LAD. The first diagonal artery has a focal 70% stenosis in the distal aspect of the vessel. The right coronary artery had a mid/distal 90% stenosis.  She had placement of a 2.75 x 28 mm drug-eluting stent ( Promus Element)  placed in the mid/distal right coronary artery.  2. hypothyroidism 3. Hyperlipidemia  History of Present Illness:  Ms. Sroka is a 80 yo with hx of CAD - s/p PCI of her RCA  .  She has had som vaginal bleeding and stopped her Brilinta and ASA.  She skipped 1 day and then restarted.  She denies any chest pain.   She has not been exercising but is interested in starting back.  She had numerous questions about how much she can exercise.  She is walking down the street and uses "the number of mailboxes that they pass" as a marker for distance. She was started on Imdur but has had some urinary irritation and wants to stop it.   March 02, 2013:   Ms. Clune developed  CP while exercising in cardiac rehab.  Exposure to cold air, cold water, eating too much, stress can all bring on these episodes of chest pain.  The pain was relieved with SL NTG.  The pains last several minutes.  She has occasionally had these episodes at rest.   The pain radiates to her arms and is almost exactly like her previous episodes of angina prior to her stent.  It is not as bad.   She is having lots of bruising from the ASA and Brilinta.  Aril 29, 2014:  When I saw her several weeks ago she was having recurrent episodes of chest pain. Cardiac catheterization revealed a patent right coronary stent. She has moderate to severe disease in the distal LAD.  She has been doing well.   No further episodes of CP.    She has had several episodes of severe lightheadedness.  This has occurred since increasing her dose of Imdur.  I suspect she is getting orthstatic hypotension.  Feb. 16, 2015:  she has good days and bad days - for no rhyme or reason.  Does not seem to be related to exercise, eating , drinking.   She has had lots of bruising.  She is also having itching.    She stopped one of    Current Outpatient Prescriptions on File Prior to Visit  Medication Sig Dispense Refill  . aspirin EC 81 MG EC tablet Take 1 tablet (81 mg total) by mouth daily.      Marland Kitchen atorvastatin (LIPITOR) 40 MG tablet TAKE 1 TABLET BY MOUTH DAILY  30 tablet  0  . BRILINTA 90 MG TABS tablet TAKE 1 TABLET BY MOUTH TWICE A DAY  60 tablet  3  . isosorbide mononitrate (IMDUR) 30 MG 24 hr tablet Take 1 tablet (30 mg total) by mouth daily.  30 tablet  6  . levothyroxine (SYNTHROID, LEVOTHROID) 125 MCG tablet Take 125 mcg by mouth daily. Taking 75 mcg once a day      .  metoprolol tartrate (LOPRESSOR) 25 MG tablet TAKE 1 TABLET BY MOUTH TWICE A DAY  60 tablet  3  . Multiple Vitamin (MULTIVITAMIN) capsule Take 1 capsule by mouth daily.      Marland Kitchen. NITROSTAT 0.4 MG SL tablet PLACE 1 TABLET UNDER THE TONGUE EVERY 5 MINUTES AS NEEDED FOR CHEST PAIN.  25 tablet  3  . NON FORMULARY Tryi- Thyroid for thyroids Daily      . NON FORMULARY Vision Optimizer for Vision       No current facility-administered medications on file prior to visit.    Allergies  Allergen Reactions  . Other Other (See Comments)    Metals; "skin gets weepy; gets worse if contact >s" (09/28/2012)  . Pollen Extract Other (See Comments)    "sneezing; watery eyes"  . Latex Itching and Swelling  . Morphine And Related Rash  . Penicillins Rash    Past Medical History  Diagnosis Date  . Glaucoma   . Coronary artery disease Sep 27, 2012    s/p PCI of the RCA with DES with remote PCI in [place] 15 to 20 years ago.   Marland Kitchen. Hypothyroidism   .  Hyperlipidemia   . Anginal pain     1990's; 09/28/2012  . Arthritis     "very mild" (09/28/2012)    Past Surgical History  Procedure Laterality Date  . Cataract extraction w/ intraocular lens  implant, bilateral  1970's  . Lumbar disc surgery  1980's    "took out a little piece" (09/28/2012)  . Reduction mammaplasty  1970's  . Coronary angioplasty with stent placement  09/28/2012    20-30% diffuse LAD stenosis, mild-mod D1 dz, 70% distal D1 lesion, mild diffuse LCx dz, 50-60% OM1 stenosis, 60% mid & 90% mid-dis RCA stenosis s/p DES; LVEF 75-80% w/ hyperdynamic fxn.   . Dental surgery      "implants"  . Inner ear surgery  ~ 2010    "put plugs in so I could fly" (09/28/2012)  . Cardiac catheterization  1990s    PCI    History  Smoking status  . Never Smoker   Smokeless tobacco  . Never Used    History  Alcohol Use  . 0.6 oz/week  . 1 Glasses of wine per week    Comment: 09/28/2012 "1 glass of wine averages once/wk; beer average once/week during summer w/friends"    No family history on file.  Reviw of Systems:  Reviewed in the HPI.  All other systems are negative.  Physical Exam: Blood pressure 120/64, pulse 60, height 5\' 3"  (1.6 m), weight 155 lb (70.308 kg). General: Well developed, well nourished, in no acute distress.  Head: Normocephalic, atraumatic, sclera non-icteric, mucus membranes are moist,   Neck: Supple. Carotids are 2 + without bruits. No JVD   Lungs: Clear   Heart: RR, normal S1 and S2  Abdomen: Soft, non-tender, non-distended with normal bowel sounds.  Msk:  Strength and tone are normal   Extremities: No clubbing or cyanosis. Trace edema - left > right.  Distal pedal pulses are 2+ and equal   Neuro: CN II - XII intact.  Alert and oriented X 3.   Psych:  Normal   ECG: Feb. 16, 2015:  NSR at 60.  No ST or T wave changes.  Assessment / Plan:

## 2014-01-14 NOTE — Assessment & Plan Note (Signed)
She is doing ok.  She has some occasional episodes of CP which do not seem to be related to exertion.     She is complaining of lots of bruising. She has been on Brilinta for  almost 2 years. We'll discontinue the Brilinta at this point.  She will let me know if she has any worsening chest pain.Sabrina Hart.  We'll check fasting lipids today. I'll see her again in 6 months we'll check fasting lipids at that time.

## 2014-01-14 NOTE — Patient Instructions (Signed)
Your physician recommends that you return for a FASTING lipid profile: TODAY  Your physician has recommended you make the following change in your medication:  STOP BRILINTA,   Your physician wants you to follow-up in:6 MONTHS  You will receive a reminder letter in the mail two months in advance. If you don't receive a letter, please call our office to schedule the follow-up appointment.

## 2014-01-23 ENCOUNTER — Telehealth: Payer: Self-pay | Admitting: *Deleted

## 2014-01-23 MED ORDER — ATORVASTATIN CALCIUM 40 MG PO TABS: ORAL_TABLET | ORAL | Status: DC

## 2014-01-23 NOTE — Telephone Encounter (Signed)
Pt declines increase in atorvastatin at this time, 3 month lab date given, pt wants to try to control with diet first.

## 2014-01-23 NOTE — Telephone Encounter (Signed)
Message copied by Antony OdeaBRILEY, Simmone J on Wed Jan 23, 2014  3:39 PM ------      Message from: Vesta MixerNAHSER, PHILIP J      Created: Fri Jan 18, 2014  9:43 AM       She has mild CAD and her LDL is 127.  She should increase her Atorvastatin to 80.  Recheck labs in 3 months ------

## 2014-01-27 ENCOUNTER — Other Ambulatory Visit (HOSPITAL_COMMUNITY): Payer: Self-pay | Admitting: Cardiovascular Disease

## 2014-01-28 ENCOUNTER — Other Ambulatory Visit (HOSPITAL_COMMUNITY): Payer: Self-pay | Admitting: Cardiovascular Disease

## 2014-04-25 ENCOUNTER — Other Ambulatory Visit: Payer: Medicare Other

## 2014-05-02 ENCOUNTER — Ambulatory Visit (INDEPENDENT_AMBULATORY_CARE_PROVIDER_SITE_OTHER): Payer: Medicare Other | Admitting: *Deleted

## 2014-05-02 DIAGNOSIS — E785 Hyperlipidemia, unspecified: Secondary | ICD-10-CM

## 2014-05-02 LAB — LIPID PANEL
CHOLESTEROL: 370 mg/dL — AB (ref 0–200)
HDL: 125.2 mg/dL (ref >39.00)
LDL Cholesterol: 232 mg/dL — ABNORMAL HIGH (ref 0–99)
NonHDL: 244.8
Total CHOL/HDL Ratio: 3
Triglycerides: 63 mg/dL (ref 0.0–149.0)
VLDL: 12.6 mg/dL (ref 0.0–40.0)

## 2014-11-07 ENCOUNTER — Encounter (HOSPITAL_COMMUNITY): Payer: Self-pay | Admitting: Cardiovascular Disease

## 2014-11-14 ENCOUNTER — Ambulatory Visit (INDEPENDENT_AMBULATORY_CARE_PROVIDER_SITE_OTHER): Payer: Medicare Other | Admitting: Podiatry

## 2014-11-14 ENCOUNTER — Encounter: Payer: Self-pay | Admitting: Podiatry

## 2014-11-14 ENCOUNTER — Ambulatory Visit (INDEPENDENT_AMBULATORY_CARE_PROVIDER_SITE_OTHER): Payer: Medicare Other

## 2014-11-14 VITALS — BP 125/71 | HR 59 | Resp 16 | Ht 64.0 in | Wt 152.0 lb

## 2014-11-14 DIAGNOSIS — I2510 Atherosclerotic heart disease of native coronary artery without angina pectoris: Secondary | ICD-10-CM

## 2014-11-14 DIAGNOSIS — Q828 Other specified congenital malformations of skin: Secondary | ICD-10-CM

## 2014-11-14 DIAGNOSIS — R609 Edema, unspecified: Secondary | ICD-10-CM

## 2014-11-14 NOTE — Progress Notes (Signed)
   Subjective:    Patient ID: Sabrina Hart, female    DOB: 04/18/1932, 80 y.o.   MRN: [id number]  HPI Comments: The left foot been swelling and has become very painful, lump on the bottom of foot that hurts   Foot Pain      Review of Systems  All other systems reviewed and are negative.      Objective:   Physical Exam: I have reviewed her past medical history medications allergies surgery social history and review of systems. Pulses are strongly palpable bilateral. Neurologic sensorium is intact per Semmes-Weinstein monofilament. Tendon reflexes are intact bilateral muscle strength is 5 over 5 dorsiflexion plantar flexors and inverters everters all intrinsic musculature is intact. Orthopedic evaluation demonstrates all joints distal to the ankle have full range of motion without crepitation. She has a prominent palpable fifth metatarsal base left. Porokeratosis is visible on physical exam plantar aspect of the fifth metatarsal base area left. Was a little tender on palpation.        Assessment & Plan:  Assessment: Porokeratosis soft tissue lesion plantar aspect fifth metatarsal base left foot.  Plan: Mechanically debrided porokeratotic lesion. Applied chemical destruction under occlusion to be washed off in 3 days. I will follow up with her in 6 weeks.

## 2014-12-24 ENCOUNTER — Ambulatory Visit: Payer: No Typology Code available for payment source | Admitting: Podiatry

## 2015-01-07 ENCOUNTER — Ambulatory Visit: Payer: Medicare Other | Admitting: Podiatry

## 2015-01-14 ENCOUNTER — Ambulatory Visit: Payer: Medicare Other | Admitting: Podiatry

## 2015-09-03 ENCOUNTER — Other Ambulatory Visit: Payer: Self-pay | Admitting: Cardiovascular Disease

## 2016-05-28 ENCOUNTER — Encounter: Payer: Self-pay | Admitting: Cardiovascular Disease

## 2016-05-28 ENCOUNTER — Ambulatory Visit (INDEPENDENT_AMBULATORY_CARE_PROVIDER_SITE_OTHER): Payer: Medicare Other | Admitting: Cardiovascular Disease

## 2016-05-28 VITALS — BP 150/82 | HR 67 | Ht 64.0 in | Wt 158.8 lb

## 2016-05-28 DIAGNOSIS — I2510 Atherosclerotic heart disease of native coronary artery without angina pectoris: Secondary | ICD-10-CM | POA: Diagnosis not present

## 2016-05-28 DIAGNOSIS — E785 Hyperlipidemia, unspecified: Secondary | ICD-10-CM | POA: Diagnosis not present

## 2016-05-28 DIAGNOSIS — I25119 Atherosclerotic heart disease of native coronary artery with unspecified angina pectoris: Secondary | ICD-10-CM | POA: Diagnosis not present

## 2016-05-28 DIAGNOSIS — E039 Hypothyroidism, unspecified: Secondary | ICD-10-CM | POA: Diagnosis not present

## 2016-05-28 LAB — LIPID PANEL
CHOL/HDL RATIO: 2.9 ratio (ref <=5.0)
Cholesterol: 385 mg/dL — ABNORMAL HIGH (ref 125–200)
HDL: 134 mg/dL (ref >=46)
LDL Cholesterol: 230 mg/dL — ABNORMAL HIGH (ref <130)
TRIGLYCERIDES: 103 mg/dL (ref <150)
VLDL: 21 mg/dL (ref <30)

## 2016-05-28 LAB — HEPATIC FUNCTION PANEL
ALT: 26 U/L (ref 6–29)
AST: 41 U/L — AB (ref 10–35)
Albumin: 4.1 g/dL (ref 3.6–5.1)
Alkaline Phosphatase: 59 U/L (ref 33–130)
BILIRUBIN DIRECT: 0.1 mg/dL (ref <=0.2)
BILIRUBIN TOTAL: 0.8 mg/dL (ref 0.2–1.2)
Indirect Bilirubin: 0.7 mg/dL (ref 0.2–1.2)
Total Protein: 6.8 g/dL (ref 6.1–8.1)

## 2016-05-28 LAB — BASIC METABOLIC PANEL
BUN: 14 mg/dL (ref 7–25)
CHLORIDE: 101 mmol/L (ref 98–110)
CO2: 28 mmol/L (ref 20–31)
CREATININE: 1.01 mg/dL — AB (ref 0.60–0.88)
Calcium: 9 mg/dL (ref 8.6–10.4)
Glucose, Bld: 81 mg/dL (ref 65–99)
POTASSIUM: 3.9 mmol/L (ref 3.5–5.3)
Sodium: 138 mmol/L (ref 135–146)

## 2016-05-28 LAB — TSH: TSH: 111.5 mIU/L — ABNORMAL HIGH

## 2016-05-28 MED ORDER — LEVOTHYROXINE SODIUM 125 MCG PO TABS
125.0000 ug | ORAL_TABLET | Freq: Every day | ORAL | Status: DC
Start: 2016-05-28 — End: 2016-09-16

## 2016-05-28 MED ORDER — ATORVASTATIN CALCIUM 40 MG PO TABS
ORAL_TABLET | ORAL | Status: DC
Start: 2016-05-28 — End: 2016-12-27

## 2016-05-28 MED ORDER — ASPIRIN 81 MG PO TBEC: 81.0000 mg | DELAYED_RELEASE_TABLET | Freq: Every day | ORAL | Status: DC

## 2016-05-28 NOTE — Progress Notes (Signed)
Gardiner RamusMary H Hart Date of Birth  07/13/1932       [facility]    [place] [facility] [address]  [address], [address] [place], [place]  [address]   [place], [place]  [id number] [phone]     [phone]   Fax  [phone]    Fax [phone]  Problem List: 1. CAD:   09/28/12  She has mild disease of her LAD. The first diagonal artery has a focal 70% stenosis in the distal aspect of the vessel. The right coronary artery had a mid/distal 90% stenosis.  She had placement of a 2.75 x 28 mm drug-eluting stent ( Promus Element)  placed in the mid/distal right coronary artery.  2. hypothyroidism 3. Hyperlipidemia  History of Present Illness:  [name] is a 82 yo with hx of CAD - s/p PCI of her RCA  .  She has had som vaginal bleeding and stopped her Brilinta and ASA.  She skipped 1 day and then restarted.  She denies any chest pain.   She has not been exercising but is interested in starting back.  She had numerous questions about how much she can exercise.  She is walking down the street and uses "the number of mailboxes that they pass" as a marker for distance. She was started on Imdur but has had some urinary irritation and wants to stop it.   March 02, 2013:   [name] developed  CP while exercising in cardiac rehab.  Exposure to cold air, cold water, eating too much, stress can all bring on these episodes of chest pain.  The pain was relieved with SL NTG.  The pains last several minutes.  She has occasionally had these episodes at rest.   The pain radiates to her arms and is almost exactly like her previous episodes of angina prior to her stent.  It is not as bad.   She is having lots of bruising from the ASA and Brilinta.  Aril 29, 2014:  When I saw her several weeks ago she was having recurrent episodes of chest pain. Cardiac catheterization revealed a patent right coronary stent. She has moderate to severe disease in the distal LAD.  She has been doing well.   No further episodes of CP.    She has had several episodes of severe lightheadedness.  This has occurred since increasing her dose of Imdur.  I suspect she is getting orthstatic hypotension.  Feb. 16, 2015:  she has good days and bad days - for no rhyme or reason.  Does not seem to be related to exercise, eating , drinking.   She has had lots of bruising.  She is also having itching.    She stopped one of   May 28, 2016:  [name] is seen back today after 2 years absence. She has stopped all her medications. She stopped her meds as a trial to see how she would feel.   She felt ok so she never restarted the meds.   No CP , no dyspnea  Eats a low fat,low salt diet.   Current Outpatient Prescriptions on File Prior to Visit  Medication Sig Dispense Refill  . aspirin EC 81 MG EC tablet Take 1 tablet (81 mg total) by mouth daily. (Patient not taking: Reported on 05/28/2016)    . atorvastatin (LIPITOR) 40 MG tablet TAKE 1 TABLET BY MOUTH DAILY (Patient not taking: Reported on 05/28/2016) 30 tablet 6  . isosorbide mononitrate (  IMDUR) 30 MG 24 hr tablet Take 1 tablet (30 mg total) by mouth daily. (Patient not taking: Reported on 05/28/2016) 30 tablet 6  . levothyroxine (SYNTHROID, LEVOTHROID) 125 MCG tablet Take 125 mcg by mouth daily. Reported on 05/28/2016    . metoprolol tartrate (LOPRESSOR) 25 MG tablet TAKE 1 TABLET BY MOUTH TWICE A DAY (Patient not taking: Reported on 05/28/2016) 60 tablet 3  . Multiple Vitamin (MULTIVITAMIN) capsule Take 1 capsule by mouth daily. Reported on 05/28/2016    . NITROSTAT 0.4 MG SL tablet PLACE 1 TABLET UNDER THE TONGUE EVERY 5 MINUTES AS NEEDED FOR CHEST PAIN. (Patient not taking: Reported on 05/28/2016) 25 tablet 0  . NON FORMULARY Reported on 05/28/2016    . NON FORMULARY Reported on 05/28/2016     No current facility-administered medications on file prior to visit.    Allergies  Allergen Reactions  . Other Other (See Comments)    Metals; "skin gets weepy; gets  worse if contact >s" (09/28/2012)  . Pollen Extract Other (See Comments)    "sneezing; watery eyes"  . Latex Itching and Swelling  . Morphine And Related Rash  . Penicillins Rash    Past Medical History  Diagnosis Date  . Glaucoma   . Coronary artery disease Sep 27, 2012    s/p PCI of the RCA [place] DES [place] 15 [place] 20 years ago.   Marland Kitchen. Hypothyroidism   . Hyperlipidemia   . Anginal pain (HCC)     1990's; 09/28/2012  . Arthritis     "very mild" (09/28/2012)    Past Surgical History  Procedure Laterality Date  . Cataract extraction w/ intraocular lens  implant, bilateral  1970's  . Lumbar disc surgery  1980's    "took out a little piece" (09/28/2012)  . Reduction mammaplasty  1970's  . Coronary angioplasty with stent placement  09/28/2012    20-30% diffuse LAD stenosis, mild-mod D1 dz, 70% distal D1 lesion, mild diffuse LCx dz, 50-60% OM1 stenosis, 60% mid & 90% mid-dis RCA stenosis s/p DES; LVEF 75-80% w/ hyperdynamic fxn.   . Dental surgery      "implants"  . Inner ear surgery  ~ 2010    "put plugs in so I could fly" (09/28/2012)  . Cardiac catheterization  1990s    PCI  . Left heart catheterization with coronary angiogram N/A 09/28/2012    Procedure: LEFT HEART CATHETERIZATION WITH CORONARY ANGIOGRAM;  Surgeon: Vesta MixerPhilip J Niang Mitcheltree, MD;  [facility];  Service: Cardiovascular;  Laterality: N/A;  . Percutaneous coronary stent intervention (pci-s)  09/28/2012    Procedure: PERCUTANEOUS CORONARY STENT INTERVENTION (PCI-S);  Surgeon: Vesta MixerPhilip J Lynnelle Mesmer, MD;  [name];  Service: Cardiovascular;;    History  Smoking status  . Never Smoker   Smokeless tobacco  . Never Used    History  Alcohol Use  . 0.6 oz/week  . 1 Glasses of wine per week    Comment: 09/28/2012 "1 glass of wine averages once/wk; beer average once/week during summer w/friends"    No family history on file.  Reviw of Systems:  Reviewed in the HPI.  All other  systems are negative.  Physical Exam: Blood pressure 150/82, pulse 67, height 5\' 4"  (1.626 m), weight 158 lb 12.8 oz (72.031 kg). General: Well developed, well nourished, in no acute distress.  Head: Normocephalic, atraumatic, sclera non-icteric, mucus membranes are moist,  Neck: Supple. Carotids are 2 + without bruits. No JVD  Lungs: Clear  Heart: RR, normal S1 and S2 Abdomen: Soft, non-tender, non-distended with normal bowel sounds. Msk:  Strength and tone are normal  Extremities: No clubbing or cyanosis. Trace edema - left > right.  Distal pedal pulses are 2+ and equal  Neuro: CN II - XII intact.  Alert and oriented X 3.  Psych:  Normal   ECG: May 28, 2016:   NSR at 22.   Old Inf. MI  Assessment / Plan:    1. CAD:   09/28/12  She has mild disease of her LAD. The first diagonal artery has a focal 70% stenosis in the distal aspect of the vessel. The right coronary artery had a mid/distal 90% stenosis.  She had placement of a 2.75 x 28 mm drug-eluting stent ( Promus Element)  placed in the mid/distal right coronary artery.   I've recommended that she restart Atorvastatin 80 mg a day  And ASA 81 mg a day   2. Hypothyroidism - has been off the synthroid for several months .  Did not get her synthroid refilled so she has been speading out her synthroid - taking 1 a week .  Will restart Synthroid 0.125 mg a day - she will start with 1/2 tablet a day for the first 2 weeks and then increase her dose up to her normal 0.125 mg a day   3. Hyperlipidemia - stopped taking her atorvastatin . Will check lipids and cmet today . Restart atorvastatin at 80 mg a day     Kristeen Miss, MD  05/28/2016 9:47 AM    [facility] [address] [place],  [address] [place], [place]  [address] Pager [phone] Phone: [phone]; Fax: [phone]

## 2016-05-28 NOTE — Patient Instructions (Signed)
Medication Instructions:  Your physician has recommended you make the following change in your medication:  1. Restart Asprin ( 81 mg ) daily 2. Restart Atorvastatin ( 80 mg ) daily 3. Restart Synthroid (.125 mcg) daily, Take one half tablet for two weeks than start 1 tablet daily.    Labwork: Your physician recommends that you have lab work today: bmet.tsh/lft/lipid Your physician recommends that you return for a FASTING lipid profile/lft/bmet on September 15 between 8-5   Testing/Procedures: -None  Follow-Up: Your physician wants you to follow-up in: 6 month with Dr. Elease HashimotoNahser.  You will receive a reminder letter in the mail two months in advance. If you don't receive a letter, please call our office to schedule the follow-up appointment.   Any Other Special Instructions Will Be Listed Below (If Applicable).  Patient was given a list today for a PCP. Synthroid will have to be filled next time by PCP.   If you need a refill on your cardiac medications before your next appointment, please call your pharmacy.

## 2016-05-31 ENCOUNTER — Telehealth: Payer: Self-pay | Admitting: Cardiovascular Disease

## 2016-05-31 NOTE — Telephone Encounter (Signed)
Fu  Pt returning RN phone call- lab results. Please call back and discuss.   

## 2016-05-31 NOTE — Telephone Encounter (Signed)
Left message to call back  

## 2016-05-31 NOTE — Telephone Encounter (Signed)
Confirmed with patient she restarted her Synthroid. She understands to take 1/2 tablet daily for 2 weeks and then 1 tablet daily. Confirmed she restarted her Lipitor. She is looking for a new PCP. She will call when she has a name to forward results. She was grateful for call.

## 2016-05-31 NOTE — Telephone Encounter (Signed)
-----   Message from Vesta MixerPhilip J Nahser, MD sent at 05/28/2016  4:24 PM EDT ----- She has been off her statin and synthroid . We have restarted her meds -  Started Synthroid 0.125 mg tabs - she is going to start 1/2 tab a day for the first 2 weeks and then increase up to the whole 0.125 mg tab. She will need to get a primary care doctor to manage this going forward. She was also restarted on her Atorvastatin today

## 2016-08-04 ENCOUNTER — Other Ambulatory Visit: Payer: Self-pay | Admitting: Cardiovascular Disease

## 2016-08-13 ENCOUNTER — Other Ambulatory Visit: Payer: Medicare Other

## 2016-09-06 ENCOUNTER — Other Ambulatory Visit: Payer: Medicare Other | Admitting: *Deleted

## 2016-09-06 DIAGNOSIS — E039 Hypothyroidism, unspecified: Secondary | ICD-10-CM

## 2016-09-06 DIAGNOSIS — I2510 Atherosclerotic heart disease of native coronary artery without angina pectoris: Secondary | ICD-10-CM

## 2016-09-06 DIAGNOSIS — E785 Hyperlipidemia, unspecified: Secondary | ICD-10-CM

## 2016-09-06 LAB — BASIC METABOLIC PANEL
BUN: 13 mg/dL (ref 7–25)
CO2: 29 mmol/L (ref 20–31)
Calcium: 9 mg/dL (ref 8.6–10.4)
Chloride: 102 mmol/L (ref 98–110)
Creat: 0.79 mg/dL (ref 0.60–0.88)
Glucose, Bld: 87 mg/dL (ref 65–99)
Potassium: 4.2 mmol/L (ref 3.5–5.3)
Sodium: 139 mmol/L (ref 135–146)

## 2016-09-06 LAB — HEPATIC FUNCTION PANEL
ALBUMIN: 3.9 g/dL (ref 3.6–5.1)
ALT: 15 U/L (ref 6–29)
AST: 23 U/L (ref 10–35)
Alkaline Phosphatase: 65 U/L (ref 33–130)
Bilirubin, Direct: 0.1 mg/dL (ref <=0.2)
Indirect Bilirubin: 0.4 mg/dL (ref 0.2–1.2)
TOTAL PROTEIN: 6.9 g/dL (ref 6.1–8.1)
Total Bilirubin: 0.5 mg/dL (ref 0.2–1.2)

## 2016-09-06 LAB — LIPID PANEL
CHOLESTEROL: 219 mg/dL — AB (ref 125–200)
HDL: 94 mg/dL (ref >=46)
LDL Cholesterol: 113 mg/dL (ref <130)
Total CHOL/HDL Ratio: 2.3 Ratio (ref <=5.0)
Triglycerides: 59 mg/dL (ref <150)
VLDL: 12 mg/dL (ref <30)

## 2016-09-07 ENCOUNTER — Telehealth: Payer: Self-pay | Admitting: Cardiovascular Disease

## 2016-09-07 NOTE — Telephone Encounter (Signed)
Follow Up:; ° ° °Returning your call. °

## 2016-09-07 NOTE — Telephone Encounter (Signed)
Reviewed lab results with patient and advised her to continue current meds.  She asked about follow-up and I scheduled her 6 month f/u with Dr. Elease HashimotoNahser on 12/15.  She thanked me for the call.

## 2016-09-14 ENCOUNTER — Other Ambulatory Visit: Payer: Self-pay

## 2016-09-14 DIAGNOSIS — I2510 Atherosclerotic heart disease of native coronary artery without angina pectoris: Secondary | ICD-10-CM

## 2016-09-14 NOTE — Telephone Encounter (Signed)
Vesta MixerPhilip J Nahser, MD at 05/28/2016 9:45 AM     Any Other Special Instructions Will Be Listed Below (If Applicable).  Patient was given a list today for a PCP. Synthroid will have to be filled next time by PCP.

## 2016-09-16 ENCOUNTER — Other Ambulatory Visit: Payer: Self-pay | Admitting: *Deleted

## 2016-09-16 DIAGNOSIS — I2510 Atherosclerotic heart disease of native coronary artery without angina pectoris: Secondary | ICD-10-CM

## 2016-09-16 MED ORDER — LEVOTHYROXINE SODIUM 125 MCG PO TABS: 125.0000 ug | ORAL_TABLET | Freq: Every day | ORAL | 6 refills | Status: DC

## 2016-11-12 ENCOUNTER — Ambulatory Visit (INDEPENDENT_AMBULATORY_CARE_PROVIDER_SITE_OTHER): Payer: Medicare Other | Admitting: Cardiovascular Disease

## 2016-11-12 ENCOUNTER — Encounter: Payer: Self-pay | Admitting: Cardiovascular Disease

## 2016-11-12 VITALS — BP 140/70 | HR 74 | Ht 64.0 in | Wt 141.4 lb

## 2016-11-12 DIAGNOSIS — E785 Hyperlipidemia, unspecified: Secondary | ICD-10-CM | POA: Diagnosis not present

## 2016-11-12 DIAGNOSIS — I2510 Atherosclerotic heart disease of native coronary artery without angina pectoris: Secondary | ICD-10-CM

## 2016-11-12 DIAGNOSIS — E039 Hypothyroidism, unspecified: Secondary | ICD-10-CM | POA: Diagnosis not present

## 2016-11-12 DIAGNOSIS — I25119 Atherosclerotic heart disease of native coronary artery with unspecified angina pectoris: Secondary | ICD-10-CM | POA: Diagnosis not present

## 2016-11-12 LAB — COMPREHENSIVE METABOLIC PANEL
ALT: 13 U/L (ref 6–29)
AST: 21 U/L (ref 10–35)
Albumin: 4.1 g/dL (ref 3.6–5.1)
Alkaline Phosphatase: 79 U/L (ref 33–130)
BILIRUBIN TOTAL: 0.5 mg/dL (ref 0.2–1.2)
BUN: 14 mg/dL (ref 7–25)
CO2: 26 mmol/L (ref 20–31)
CREATININE: 0.82 mg/dL (ref 0.60–0.88)
Calcium: 9.4 mg/dL (ref 8.6–10.4)
Chloride: 105 mmol/L (ref 98–110)
Glucose, Bld: 96 mg/dL (ref 65–99)
Potassium: 4.3 mmol/L (ref 3.5–5.3)
SODIUM: 142 mmol/L (ref 135–146)
Total Protein: 6.8 g/dL (ref 6.1–8.1)

## 2016-11-12 LAB — LIPID PANEL
Cholesterol: 192 mg/dL (ref <200)
HDL: 101 mg/dL (ref >50)
LDL CALC: 82 mg/dL (ref <100)
TRIGLYCERIDES: 46 mg/dL (ref <150)
Total CHOL/HDL Ratio: 1.9 Ratio (ref <5.0)
VLDL: 9 mg/dL (ref <30)

## 2016-11-12 LAB — TSH: TSH: 2.2 mIU/L

## 2016-11-12 MED ORDER — ASPIRIN EC 81 MG PO TBEC: 81.0000 mg | DELAYED_RELEASE_TABLET | Freq: Every day | ORAL | Status: DC

## 2016-11-12 NOTE — Progress Notes (Signed)
Sabrina Hart Date of Birth  04/26/1932       [facility]    [place] [facility] [address]  [address] [place], [place]  [address]   [place], [place]  [id number] [phone]     [phone]   Fax  [phone]    Fax [phone]  Problem List: 1. CAD:   09/28/12  She has mild disease of her LAD. The first diagonal artery has a focal 70% stenosis in the distal aspect of the vessel. The right coronary artery had a mid/distal 90% stenosis.  She had placement of a 2.75 x 28 mm drug-eluting stent ( Promus Element)  placed in the mid/distal right coronary artery.  2. hypothyroidism 3. Hyperlipidemia  History of Present Illness:  [name] is a 82 yo with hx of CAD - s/p PCI of her RCA  .  She has had som vaginal bleeding and stopped her Brilinta and ASA.  She skipped 1 day and then restarted.  She denies any chest pain.   She has not been exercising but is interested in starting back.  She had numerous questions about how much she can exercise.  She is walking down the street and uses "the number of mailboxes that they pass" as a marker for distance. She was started on Imdur but has had some urinary irritation and wants to stop it.   March 02, 2013:   [name] developed  CP while exercising in cardiac rehab.  Exposure to cold air, cold water, eating too much, stress can all bring on these episodes of chest pain.  The pain was relieved with SL NTG.  The pains last several minutes.  She has occasionally had these episodes at rest.   The pain radiates to her arms and is almost exactly like her previous episodes of angina prior to her stent.  It is not as bad.   She is having lots of bruising from the ASA and Brilinta.  Aril 29, 2014:  When I saw her several weeks ago she was having recurrent episodes of chest pain. Cardiac catheterization revealed a patent right coronary stent. She has moderate to severe disease in the distal LAD.  She has been doing well.   No further episodes of CP.    She has had several episodes of severe lightheadedness.  This has occurred since increasing her dose of Imdur.  I suspect she is getting orthstatic hypotension.  Feb. 16, 2015:  she has good days and bad days - for no rhyme or reason.  Does not seem to be related to exercise, eating , drinking.   She has had lots of bruising.  She is also having itching.    She stopped one of   May 28, 2016:  Sabrina Hart is seen back today after 2 years absence. She has stopped all her medications. She stopped her meds as a trial to see how she would feel.   She felt ok so she never restarted the meds.   No CP , no dyspnea  Eats a low fat,low salt diet.  Dec. 15, 2017:  Sabrina Hart is seen for follow up of her CAD and hyperlipidemia     Current Outpatient Prescriptions on File Prior to Visit  Medication Sig Dispense Refill  . atorvastatin (LIPITOR) 40 MG tablet TAKE 1 TABLET BY MOUTH DAILY 30 tablet 6  . levothyroxine (SYNTHROID, LEVOTHROID) 125 MCG tablet Take 1 tablet (125 mcg total) by mouth daily.  Reported on 05/28/2016 30 tablet 6  . metoprolol tartrate (LOPRESSOR) 25 MG tablet TAKE 1 TABLET BY MOUTH TWICE A DAY 60 tablet 3  . Multiple Vitamin (MULTIVITAMIN) capsule Take 1 capsule by mouth daily. Reported on 05/28/2016    . nitroGLYCERIN (NITROSTAT) 0.4 MG SL tablet PLACE 1 TABLET UNDER THE TONGUE EVERY 5 MINUTES AS NEEDED FOR CHEST PAIN. 25 tablet 6  . NON FORMULARY Reported on 05/28/2016    . NON FORMULARY Reported on 05/28/2016     No current facility-administered medications on file prior to visit.     Allergies  Allergen Reactions  . Other Other (See Comments)    Metals; "skin gets weepy; gets worse if contact >s" (09/28/2012)  . Pollen Extract Other (See Comments)    "sneezing; watery eyes"  . Latex Itching and Swelling  . Morphine And Related Rash  . Penicillins Rash    Past Medical History:  Diagnosis Date  . Anginal pain (HCC)    1990's; 09/28/2012  .  Arthritis    "very mild" (09/28/2012)  . Coronary artery disease Sep 27, 2012   s/p PCI of the RCA with DES [place] 15 [place] 20 years ago.   . Glaucoma   . Hyperlipidemia   . Hypothyroidism     Past Surgical History:  Procedure Laterality Date  . CARDIAC CATHETERIZATION  1990s   PCI  . CATARACT EXTRACTION W/ INTRAOCULAR LENS  IMPLANT, BILATERAL  1970's  . CORONARY ANGIOPLASTY WITH STENT PLACEMENT  09/28/2012   20-30% diffuse LAD stenosis, mild-mod D1 dz, 70% distal D1 lesion, mild diffuse LCx dz, 50-60% OM1 stenosis, 60% mid & 90% mid-dis RCA stenosis s/p DES; LVEF 75-80% w/ hyperdynamic fxn.   . DENTAL SURGERY     "implants"  . INNER EAR SURGERY  ~ 2010   "put plugs in so I could fly" (09/28/2012)  . LEFT HEART CATHETERIZATION WITH CORONARY ANGIOGRAM N/A 09/28/2012   Procedure: LEFT HEART CATHETERIZATION WITH CORONARY ANGIOGRAM;  Surgeon: Vesta MixerPhilip J Ameya Kutz, MD;  [facility];  Service: Cardiovascular;  Laterality: N/A;  . LUMBAR DISC SURGERY  1980's   "took out a little piece" (09/28/2012)  . PERCUTANEOUS CORONARY STENT INTERVENTION (PCI-S)  09/28/2012   Procedure: PERCUTANEOUS CORONARY STENT INTERVENTION (PCI-S);  Surgeon: Vesta MixerPhilip J Adamarie Izzo, MD;  [facility];  Service: Cardiovascular;;  . REDUCTION MAMMAPLASTY  1970's    History  Smoking Status  . Never Smoker  Smokeless Tobacco  . Never Used    History  Alcohol Use  . 0.6 oz/week  . 1 Glasses of wine per week    Comment: 09/28/2012 "1 glass of wine averages once/wk; beer average once/week during summer w/friends"    History reviewed. No pertinent family history.  Reviw of Systems:  Reviewed in the HPI.  All other systems are negative.  Physical Exam: Blood pressure 140/70, pulse 74, height 5\' 4"  (1.626 m), weight 141 lb 6.4 oz (64.1 kg), SpO2 99 %. General: Well developed, well nourished, in no acute distress.  Head: Normocephalic, atraumatic, sclera non-icteric, mucus  membranes are moist,  Neck: Supple. Carotids are 2 + without bruits. No JVD  Lungs: Clear  Heart: RR, normal S1 and S2 Abdomen: Soft, non-tender, non-distended with normal bowel sounds. Msk:  Strength and tone are normal  Extremities: No clubbing or cyanosis. Trace edema - left > right.  Distal pedal pulses are 2+ and equal  Neuro: CN II - XII intact.  Alert and oriented X  3.  Psych:  Normal   ECG: May 28, 2016:   NSR at 49.   Old Inf. MI  Assessment / Plan:    1. CAD:   09/28/12  She has mild disease of her LAD. The first diagonal artery has a focal 70% stenosis in the distal aspect of the vessel. The right coronary artery had a mid/distal 90% stenosis.  She had placement of a 2.75 x 28 mm drug-eluting stent ( Promus Element)  placed in the mid/distal right coronary artery.    Atorvastatin 40 mg a day  Despite discussing this last year, she is not on ASA.   will start ASA 81 mg a day   2. Hypothyroidism -  She has not yet gotten a primary MD. Will get a TSH today .   Will continue the current dose of synthroid for now.   She has not taken her synthroid as prescribed in the past   3. Hyperlipidemia - stopped taking her atorvastatin . Will check lipids and cmet today . atorvastatin at 40 mg a day     Kristeen Miss, MD  11/12/2016 8:06 AM    [facility] [address] [place],  [address] [place], [place]  [address] Pager [phone] Phone: [phone]; Fax: [phone]

## 2016-11-12 NOTE — Patient Instructions (Signed)
Medication Instructions:  START Aspirin 81 mg once daily   Labwork: TODAY - cholesterol, complete metabolic panel, TSH   Testing/Procedures: None Ordered   Follow-Up: Your physician wants you to follow-up in: 6 months with Dr. Elease HashimotoNahser.  You will receive a reminder letter in the mail two months in advance. If you don't receive a letter, please call our office to schedule the follow-up appointment.   If you need a refill on your cardiac medications before your next appointment, please call your pharmacy.   Thank you for choosing [facility]! Eligha BridegroomMichelle Doran Nestle, RN [phone]

## 2016-11-17 ENCOUNTER — Telehealth: Payer: Self-pay | Admitting: Cardiovascular Disease

## 2016-11-17 NOTE — Telephone Encounter (Signed)
Informed pt of lab results. Pt verbalized understanding. 

## 2016-11-17 NOTE — Telephone Encounter (Signed)
New message ° ° ° ° °Returning a call to the nurse to get lab results °

## 2016-11-27 ENCOUNTER — Ambulatory Visit (HOSPITAL_COMMUNITY)
Admission: EM | Admit: 2016-11-27 | Discharge: 2016-11-27 | Disposition: A | Discharge status: Home / Self Care | Payer: Medicare Other | Attending: Emergency Medicine | Admitting: Emergency Medicine

## 2016-11-27 ENCOUNTER — Encounter (HOSPITAL_COMMUNITY): Payer: Self-pay | Admitting: *Deleted

## 2016-11-27 ENCOUNTER — Ambulatory Visit (INDEPENDENT_AMBULATORY_CARE_PROVIDER_SITE_OTHER): Payer: Medicare Other

## 2016-11-27 DIAGNOSIS — M546 Pain in thoracic spine: Secondary | ICD-10-CM

## 2016-11-27 NOTE — ED Triage Notes (Signed)
Pt  Reports   Back  Pain     X   3  Days           Pain  Is  Worse  On  Certain  posistions     And on palpation

## 2016-11-27 NOTE — ED Provider Notes (Signed)
[facility]    CSN: [id number] Arrival date & time: 11/27/16  1458     History   Chief Complaint Chief Complaint  Patient presents with  . Back Pain    HPI Sabrina Hart is a 82 y.o. female.   HPI She is an 82 year old woman here for evaluation of back pain. She states 3 days ago, she was getting into her car and when she leans back she felt a sharp pain in her thoracic back. This has continued to bother her intermittently. She states the pain is sometimes along the spine at other times just next to the spine on the right side. It does not radiate. It is sometimes worse with palpation and sometimes worse with movement. No known injury or trauma. She is concerned about the needle being in her back as it felt like something stuck her when she leaned back in her car. She has had similar episodes previously.  Past Medical History:  Diagnosis Date  . Anginal pain (HCC)    1990's; 09/28/2012  . Arthritis    "very mild" (09/28/2012)  . Coronary artery disease Sep 27, 2012   s/p PCI of the RCA with DES with remote PCI in [place] 15 to 20 years ago.   . Glaucoma   . Hyperlipidemia   . Hypothyroidism     Patient Active Problem List   Diagnosis Date Noted  . Unstable angina (HCC) 03/02/2013  . Hypothyroidism 09/29/2012  . Hyperlipidemia 09/29/2012  . Hypokalemia 09/29/2012  . CAD (coronary artery disease) 09/27/2012    Past Surgical History:  Procedure Laterality Date  . CARDIAC CATHETERIZATION  1990s   PCI  . CATARACT EXTRACTION W/ INTRAOCULAR LENS  IMPLANT, BILATERAL  1970's  . CORONARY ANGIOPLASTY WITH STENT PLACEMENT  09/28/2012   20-30% diffuse LAD stenosis, mild-mod D1 dz, 70% distal D1 lesion, mild diffuse LCx dz, 50-60% OM1 stenosis, 60% mid & 90% mid-dis RCA stenosis s/p DES; LVEF 75-80% w/ hyperdynamic fxn.   . DENTAL SURGERY     "implants"  . INNER EAR SURGERY  ~ 2010   "put plugs in so I could fly" (09/28/2012)  . LEFT HEART CATHETERIZATION WITH  CORONARY ANGIOGRAM N/A 09/28/2012   Procedure: LEFT HEART CATHETERIZATION WITH CORONARY ANGIOGRAM;  Surgeon: Vesta MixerPhilip J Nahser, MD;  [facility];  Service: Cardiovascular;  Laterality: N/A;  . LUMBAR DISC SURGERY  1980's   "took out a little piece" (09/28/2012)  . PERCUTANEOUS CORONARY STENT INTERVENTION (PCI-S)  09/28/2012   Procedure: PERCUTANEOUS CORONARY STENT INTERVENTION (PCI-S);  Surgeon: Vesta MixerPhilip J Nahser, MD;  [facility];  Service: Cardiovascular;;  . REDUCTION MAMMAPLASTY  1970's    OB History    No data available       Home Medications    Prior to Admission medications   Medication Sig Start Date End Date Taking? Authorizing Provider  aspirin EC 81 MG tablet Take 1 tablet (81 mg total) by mouth daily. 11/12/16   Vesta MixerPhilip J Nahser, MD  atorvastatin (LIPITOR) 40 MG tablet TAKE 1 TABLET BY MOUTH DAILY 05/28/16   Vesta MixerPhilip J Nahser, MD  dorzolamide-timolol (COSOPT) 22.3-6.8 MG/ML ophthalmic solution Place 1 drop into both eyes daily. 11/11/16   Historical Provider, MD  levothyroxine (SYNTHROID, LEVOTHROID) 125 MCG tablet Take 1 tablet (125 mcg total) by mouth daily. Reported on 05/28/2016 09/16/16   Vesta MixerPhilip J Nahser, MD  metoprolol tartrate (LOPRESSOR) 25 MG tablet TAKE 1 TABLET BY MOUTH TWICE A DAY    Loistine ChancePhilip  Earnstine Regal, MD  Multiple Vitamin (MULTIVITAMIN) capsule Take 1 capsule by mouth daily. Reported on 05/28/2016    Historical Provider, MD  nitroGLYCERIN (NITROSTAT) 0.4 MG SL tablet PLACE 1 TABLET UNDER THE TONGUE EVERY 5 MINUTES AS NEEDED FOR CHEST PAIN. 08/04/16   [name], MD  NON FORMULARY Reported on 05/28/2016    Historical Provider, MD  NON FORMULARY Reported on 05/28/2016    Historical Provider, MD  timolol (TIMOPTIC) 0.5 % ophthalmic solution Place 1 drop into both eyes daily. 10/06/16   Historical Provider, MD    Family History History reviewed. No pertinent family history.  Social History Social History  Substance Use Topics  . Smoking status: Never  Smoker  . Smokeless tobacco: Never Used  . Alcohol use 0.6 oz/week    1 Glasses of wine per week     Comment: 09/28/2012 "1 glass of wine averages once/wk; beer average once/week during summer w/friends"     Allergies   Other; Pollen extract; Latex; Morphine and related; and Penicillins   Review of Systems Review of Systems As in history of present illness  Physical Exam Triage Vital Signs ED Triage Vitals [11/27/16 1705]  Enc Vitals Group     BP 160/70     Pulse Rate 78     Resp 18     Temp 98.6 F (37 C)     Temp Source Oral     SpO2 100 %     Weight      Height      Head Circumference      Peak Flow      Pain Score      Pain Loc      Pain Edu?      Excl. in GC?    No data found.   Updated Vital Signs BP 160/70 (BP Location: Right Arm)   Pulse 78   Temp 98.6 F (37 C) (Oral)   Resp 18   SpO2 100%   Visual Acuity Right Eye Distance:   Left Eye Distance:   Bilateral Distance:    Right Eye Near:   Left Eye Near:    Bilateral Near:     Physical Exam  Constitutional: She is oriented to person, place, and time. She appears well-developed and well-nourished. No distress.  Cardiovascular: Normal rate.   Pulmonary/Chest: Effort normal.  Musculoskeletal:  Back: No erythema or edema. No visible bruising or puncture wounds. No vertebral tenderness or step-offs. She does have some mild spasm and tenderness to the right thoracic paraspinous muscles.  Neurological: She is alert and oriented to person, place, and time.     UC Treatments / Results  Labs (all labs ordered are listed, but only abnormal results are displayed) Labs Reviewed - No data to display  EKG  EKG Interpretation None       Radiology Dg Thoracic Spine 2 View  Result Date: 11/27/2016 CLINICAL DATA:  Chronic upper back pain without known injury. EXAM: THORACIC SPINE 2 VIEWS COMPARISON:  Radiographs of September 27, 2012. FINDINGS: Mild S-shaped scoliosis of thoracic spine is noted. No  fracture or spondylolisthesis is noted. Multilevel degenerative disc disease is noted in the mid thoracic spine. IMPRESSION: Multilevel degenerative disc disease. No acute abnormality seen in the thoracic spine. Electronically Signed   By: Lupita Raider, M.D.   On: 11/27/2016 18:04    Procedures Procedures (including critical care time)  Medications Ordered in UC Medications - No data to display   Initial Impression /  Assessment and Plan / UC Course  I have reviewed the triage vital signs and the nursing notes.  Pertinent labs & imaging results that were available during my care of the patient were reviewed by me and considered in my medical decision making (see chart for details).  Clinical Course     No sign of foreign body on x-ray. She does have significant degenerative disease. Pain is likely coming from nerve irritation or a small muscle tear. Symptomatic treatment with ice or heat. Tylenol as needed. Follow-up as needed.  Final Clinical Impressions(s) / UC Diagnoses   Final diagnoses:  Acute midline thoracic back pain    New Prescriptions New Prescriptions   No medications on file     Charm RingsErin J Kalinda Romaniello, MD 11/27/16 [id number]

## 2016-11-27 NOTE — Discharge Instructions (Signed)
Your pain is either coming from a small tear in the muscle or some nerve irritation. There is no foreign body in your back. Apply some ice or heat, whichever feels better. If you need some thing, you can take Tylenol. Follow-up as needed.

## 2016-12-22 ENCOUNTER — Encounter: Payer: Self-pay | Admitting: *Deleted

## 2016-12-22 ENCOUNTER — Encounter: Payer: Self-pay | Admitting: Cardiology

## 2016-12-22 DIAGNOSIS — Z006 Encounter for examination for normal comparison and control in clinical research program: Secondary | ICD-10-CM

## 2016-12-22 NOTE — Progress Notes (Signed)
Orion-10 Study  All elements of the informed consent form,study requirements and expectations were reviewed with the patient, and all questions and concerns were identified and addressed prior to signing of the consent. No procedures were performed prior to consenting the patient. The patient was given an adequate amount of time to make an informed decision. A copy of the informed consent was provided to the patient to take home.  12/22/2016 8:45am

## 2016-12-27 ENCOUNTER — Other Ambulatory Visit: Payer: Self-pay | Admitting: Cardiovascular Disease

## 2016-12-27 DIAGNOSIS — E785 Hyperlipidemia, unspecified: Secondary | ICD-10-CM

## 2016-12-27 DIAGNOSIS — E039 Hypothyroidism, unspecified: Secondary | ICD-10-CM

## 2016-12-27 DIAGNOSIS — I2510 Atherosclerotic heart disease of native coronary artery without angina pectoris: Secondary | ICD-10-CM

## 2016-12-30 ENCOUNTER — Other Ambulatory Visit: Payer: Self-pay | Admitting: *Deleted

## 2016-12-30 ENCOUNTER — Encounter: Payer: Self-pay | Admitting: Cardiology

## 2016-12-30 ENCOUNTER — Encounter: Payer: Self-pay | Admitting: *Deleted

## 2016-12-30 DIAGNOSIS — Z006 Encounter for examination for normal comparison and control in clinical research program: Secondary | ICD-10-CM

## 2016-12-30 MED ORDER — AMBULATORY NON FORMULARY MEDICATION: 300.0000 mg | Status: DC

## 2016-12-30 NOTE — Progress Notes (Signed)
Orion-10 Study Patient was randomized in the Orion-10 study. Patient received Inclirisan sodium vs placebo injection.Patient tolerated injection.I will see patient in 30 days for next visit.

## 2017-01-03 ENCOUNTER — Encounter: Payer: Self-pay | Admitting: Cardiology

## 2017-01-04 ENCOUNTER — Telehealth: Payer: Self-pay | Admitting: *Deleted

## 2017-01-04 NOTE — Telephone Encounter (Signed)
I called patient to let her know we have abnormal  WBC of 3.4. I asked patient to come in for repeat CBC. Patient stated she would come in on Thursday at 8:15 am. Labs were reviewed for Orion-10 Study by Dr. Riley KillStuckey.

## 2017-01-10 ENCOUNTER — Telehealth: Payer: Self-pay | Admitting: *Deleted

## 2017-01-10 NOTE — Telephone Encounter (Signed)
I called patient to let her know that repeat CBC with diff was normal. Lab was reviewed by Dr. Riley KillStuckey. I will see patient in 30 days for next Orion-10 visit.

## 2017-01-28 ENCOUNTER — Encounter: Payer: Self-pay | Admitting: *Deleted

## 2017-01-28 DIAGNOSIS — Z006 Encounter for examination for normal comparison and control in clinical research program: Secondary | ICD-10-CM

## 2017-01-28 NOTE — Progress Notes (Signed)
Orion-10 Study Patient was seen for 30-day visit for Orion-10 Study. Patient is doing well and will see patient in 60 days for next study visit.

## 2017-03-31 ENCOUNTER — Encounter: Payer: Self-pay | Admitting: *Deleted

## 2017-03-31 DIAGNOSIS — Z006 Encounter for examination for normal comparison and control in clinical research program: Secondary | ICD-10-CM

## 2017-03-31 NOTE — Progress Notes (Signed)
Patient came to Research clinic for V3-90Days [facility]. Had no complaints or AEs to report.  Received injection and remained with us for the required time. V4-Day 150 appointment scheduled.

## 2017-04-26 ENCOUNTER — Ambulatory Visit (INDEPENDENT_AMBULATORY_CARE_PROVIDER_SITE_OTHER): Payer: Medicare Other | Admitting: Primary Care

## 2017-04-26 ENCOUNTER — Encounter: Payer: Self-pay | Admitting: Primary Care

## 2017-04-26 DIAGNOSIS — E039 Hypothyroidism, unspecified: Secondary | ICD-10-CM | POA: Diagnosis not present

## 2017-04-26 DIAGNOSIS — E785 Hyperlipidemia, unspecified: Secondary | ICD-10-CM

## 2017-04-26 DIAGNOSIS — I25119 Atherosclerotic heart disease of native coronary artery with unspecified angina pectoris: Secondary | ICD-10-CM | POA: Diagnosis not present

## 2017-04-26 DIAGNOSIS — R600 Localized edema: Secondary | ICD-10-CM

## 2017-04-26 NOTE — Assessment & Plan Note (Signed)
Stable on atorvastatin 40 mg, continue same.

## 2017-04-26 NOTE — Assessment & Plan Note (Signed)
Participating in the [employer] study through cardiology. Continue beta blocker, discussed use of aspirin for which she refuses.

## 2017-04-26 NOTE — Assessment & Plan Note (Signed)
TSH unremarkable in 10/2016, repeat this December. Continue levothyroxine 125 mcg.

## 2017-04-26 NOTE — Progress Notes (Signed)
Subjective:    Patient ID: Sabrina Hart, female    DOB: 07/14/1932, 83 y.o.   MRN: [id number]  HPI  [name] is an 83 year old female who presents today to establish care and discuss the problems mentioned below. Will obtain old records.  1) Hypothyroidism: Currently managed on levothyroxine 125 mcg. Her last TSH was normal in December 2017.  2) CAD/Essential Hypertension: Currently managed on metoprolol tartrate 25 mg BID. Her BP in the office today is 126/70. She is currently enrolled in a cardiovascular research study [employer](Orion Research Study). She is scheduled to see her cardiologist in mid June. She is not taking aspirin 81 mg as she refuses to do so.  3) Hyperlipidemia: Currently managed on atorvastatin 40 mg. Her last lipid panel was normal in December 2017.  4) Lower extremity edema: Located to the right foot that has been intermittent for several years, more constant over the last 2-3 years. She is active. She tried wearing compression hose in the past but has not done this due recently to discomfort. She denies pain, erythema, warmth, calf swelling/pain, cold sensation, tingling/numbness.   5) Glaucoma: Currently managed on Cosopt drops. She is following with Dr. Harlon FlorWhitaker and is due to be evaluated soon.   Review of Systems  Constitutional: Negative for fatigue.  Eyes: Negative for visual disturbance.  Respiratory: Negative for shortness of breath.   Cardiovascular: Negative for chest pain.       Right lower extremity edema.  Musculoskeletal: Positive for arthralgias. Negative for myalgias.  Neurological: Negative for dizziness and numbness.       Past Medical History:  Diagnosis Date  . Anginal pain (HCC)    1990's; 09/28/2012  . Arthritis    "very mild" (09/28/2012)  . Coronary artery disease Sep 27, 2012   s/p PCI of the RCA with DES with remote PCI in [place] 15 to 20 years ago.   . Glaucoma   . Hyperlipidemia   . Hypothyroidism      Social History   Social  History  . Marital status: Single    Spouse name: N/A  . Number of children: N/A  . Years of education: N/A   Occupational History  . Not on file.   Social History Main Topics  . Smoking status: Never Smoker  . Smokeless tobacco: Never Used  . Alcohol use 0.6 oz/week    1 Glasses of wine per week     Comment: 09/28/2012 "1 glass of wine averages once/wk; beer average once/week during summer w/friends"  . Drug use: No  . Sexual activity: No   Other Topics Concern  . Not on file   Social History Narrative  . No narrative on file    Past Surgical History:  Procedure Laterality Date  . CARDIAC CATHETERIZATION  1990s   PCI  . CATARACT EXTRACTION W/ INTRAOCULAR LENS  IMPLANT, BILATERAL  1970's  . CORONARY ANGIOPLASTY WITH STENT PLACEMENT  09/28/2012   20-30% diffuse LAD stenosis, mild-mod D1 dz, 70% distal D1 lesion, mild diffuse LCx dz, 50-60% OM1 stenosis, 60% mid & 90% mid-dis RCA stenosis s/p DES; LVEF 75-80% w/ hyperdynamic fxn.   . DENTAL SURGERY     "implants"  . INNER EAR SURGERY  ~ 2010   "put plugs in so I could fly" (09/28/2012)  . LEFT HEART CATHETERIZATION WITH CORONARY ANGIOGRAM N/A 09/28/2012   Procedure: LEFT HEART CATHETERIZATION WITH CORONARY ANGIOGRAM;  Surgeon: Vesta MixerPhilip J Nahser, MD;  [facility];  Service:  Cardiovascular;  Laterality: N/A;  . LUMBAR DISC SURGERY  1980's   "took out a little piece" (09/28/2012)  . PERCUTANEOUS CORONARY STENT INTERVENTION (PCI-S)  09/28/2012   Procedure: PERCUTANEOUS CORONARY STENT INTERVENTION (PCI-S);  Surgeon: [name], MD;  Location: [facility] CATH LAB;  Service: Cardiovascular;;  . REDUCTION MAMMAPLASTY  1970's    No family history on file.  Allergies  Allergen Reactions  . Other Other (See Comments)    Metals; "skin gets weepy; gets worse if contact >s" (09/28/2012)  . Pollen Extract Other (See Comments)    "sneezing; watery eyes"  . Latex Itching and Swelling  . Morphine And Related Rash  .  Penicillins Rash    Current Outpatient Prescriptions on File Prior to Visit  Medication Sig Dispense Refill  . atorvastatin (LIPITOR) 40 MG tablet TAKE 1 TABLET BY MOUTH EVERY DAY 30 tablet 9  . levothyroxine (SYNTHROID, LEVOTHROID) 125 MCG tablet Take 1 tablet (125 mcg total) by mouth daily. Reported on 05/28/2016 30 tablet 6  . Multiple Vitamin (MULTIVITAMIN) capsule Take 1 capsule by mouth daily. Reported on 05/28/2016    . AMBULATORY NON FORMULARY MEDICATION Take 300 mg by mouth as directed. Medication Name: Inclirisan sodium 300mg  vs placebo (orion-10 Study per protocol    . aspirin EC 81 MG tablet Take 1 tablet (81 mg total) by mouth daily. (Patient not taking: Reported on 04/26/2017)    . dorzolamide-timolol (COSOPT) 22.3-6.8 MG/ML ophthalmic solution Place 1 drop into both eyes daily.    . metoprolol tartrate (LOPRESSOR) 25 MG tablet TAKE 1 TABLET BY MOUTH TWICE A DAY 60 tablet 3  . nitroGLYCERIN (NITROSTAT) 0.4 MG SL tablet PLACE 1 TABLET UNDER THE TONGUE EVERY 5 MINUTES AS NEEDED FOR CHEST PAIN. (Patient not taking: Reported on 04/26/2017) 25 tablet 6   No current facility-administered medications on file prior to visit.     BP 126/70   Pulse 79   Temp 98 F (36.7 C) (Oral)   Ht 5\' 4"  (1.626 m)   Wt 144 lb 12.8 oz (65.7 kg)   SpO2 98%   BMI 24.85 kg/m    Objective:   Physical Exam  Constitutional: She appears well-nourished.  Neck: Neck supple. No thyromegaly present.  Cardiovascular: Normal rate and regular rhythm.   Moderate lower extremity edema, mainly over dorsal foot. Trace pitting to lower leg and food. 2+ DP and PT bilaterally. Left foot unremarkable.  Pulmonary/Chest: Effort normal and breath sounds normal.  Skin: Skin is warm and dry.  Psychiatric: She has a normal mood and affect.          Assessment & Plan:

## 2017-04-26 NOTE — Assessment & Plan Note (Addendum)
Located to right lower extremity, mostly foot, trace pitting. Discussed potential causes and work up associated. She declines and would like to speak with her cardiologist first. Consider echo vs vascular ultrasound. Could be lymphedema. Recommended compression hose for which she refuses. Discussed to elevated extremities when at rest.

## 2017-04-26 NOTE — Patient Instructions (Signed)
Schedule an appointment with your eye doctor as soon as possible.  Follow up with the cardiologist as scheduled.  Please schedule a follow up office visit in December this year to recheck thyroid function and other labs.  It was a pleasure to meet you today! Please don't hesitate to call me with any questions. Welcome to [facility]!

## 2017-04-27 ENCOUNTER — Encounter: Payer: Self-pay | Admitting: *Deleted

## 2017-05-16 ENCOUNTER — Encounter: Payer: Self-pay | Admitting: Physician Assistant

## 2017-05-16 NOTE — Progress Notes (Signed)
Cardiology Office Note    Date:  05/17/2017   ID:  Sabrina Hart, DOB 16-Dec-1931, MRN [id number]  PCP:  Doreene Nest, NP  Cardiologist:  Dr. Elease Hashimoto  Chief Complaint: 6 months follow up for CAD  History of Present Illness:   Sabrina Hart is a 83 y.o. female with a history of CAD s/p DES to RCA, hyperlipidemia and hypothyroidism presents for follow-up.  Last cardiac catheterization by Dr. Antoine Poche 02/2013 showed a patent mid RCA stent, 30% left main, 70% mid to distal tanden 70% lesions x 3, 60% mid diag, 50% ostial LCx, 40% mid LCx--> Rx Tx. Recommended stress perfusion imaging to evaluate the possible hemodynamic significance of the LAD for recurrent chest pain.   She was doing well on cardiac stand point when last seen by Dr. Elease Hashimoto 10/2016.  She is here for follow-up.Patient denies any chest pain, shortness of breath, palpitation, orthopnea, PND or melena. She does not like many providers for personal reasons. Recently changed PCP and foot doctor. She has a chronic left lower extremity edema. Worse with gardening. No Pain. Previously seen by "foot doctor"  but she did not like the provider and never followed up. Intermittent complains of dizziness mostly at home. Opening of window makes this better. Hard to hear. She is going to make appointment with ENT for further evaluation of dizziness and hearing loss.   Past Medical History:  Diagnosis Date  . Anginal pain (HCC)    1990's; 09/28/2012  . Arthritis    "very mild" (09/28/2012)  . Coronary artery disease 09/27/2012   s/p PCI of the RCA with DES with remote PCI in [place] 15 to 20 years ago. 2. cath 02/2013 patent mid RCA stent, 30% left main, 70% mid to distaal tanden 70% lesions x 3, 60% mid diag, 50% ostial LCx, 40% mid LCx.   . Glaucoma   . Hyperlipidemia   . Hypothyroidism     Past Surgical History:  Procedure Laterality Date  . CARDIAC CATHETERIZATION  1990s   PCI  . CATARACT EXTRACTION W/ INTRAOCULAR LENS   IMPLANT, BILATERAL  1970's  . CORONARY ANGIOPLASTY WITH STENT PLACEMENT  09/28/2012   20-30% diffuse LAD stenosis, mild-mod D1 dz, 70% distal D1 lesion, mild diffuse LCx dz, 50-60% OM1 stenosis, 60% mid & 90% mid-dis RCA stenosis s/p DES; LVEF 75-80% w/ hyperdynamic fxn.   . DENTAL SURGERY     "implants"  . INNER EAR SURGERY  ~ 2010   "put plugs in so I could fly" (09/28/2012)  . LEFT HEART CATHETERIZATION WITH CORONARY ANGIOGRAM N/A 09/28/2012   Procedure: LEFT HEART CATHETERIZATION WITH CORONARY ANGIOGRAM;  Surgeon: [name], MD;  Location: [facility] CATH LAB;  Service: Cardiovascular;  Laterality: N/A;  . LUMBAR DISC SURGERY  1980's   "took out a little piece" (09/28/2012)  . PERCUTANEOUS CORONARY STENT INTERVENTION (PCI-S)  09/28/2012   Procedure: PERCUTANEOUS CORONARY STENT INTERVENTION (PCI-S);  Surgeon: [name], MD;  Location: [facility] CATH LAB;  Service: Cardiovascular;;  . REDUCTION MAMMAPLASTY  1970's    Current Medications: Prior to Admission medications   Medication Sig Start Date End Date Taking? Authorizing Provider  AMBULATORY NON FORMULARY MEDICATION Take 300 mg by mouth as directed. Medication Name: Inclirisan sodium 300mg  vs placebo (orion-10 Study per protocol 12/30/16   Herby Abraham, MD  aspirin EC 81 MG tablet Take 1 tablet (81 mg total) by mouth daily. Patient not taking: Reported on 04/26/2017 11/12/16   Nahser, Deloris Ping, MD  atorvastatin (LIPITOR) 40 MG tablet TAKE 1 TABLET BY MOUTH EVERY DAY 12/28/16   Nahser, Deloris Ping, MD  dorzolamide-timolol (COSOPT) 22.3-6.8 MG/ML ophthalmic solution Place 1 drop into both eyes daily. 11/11/16   [provider]  levothyroxine (SYNTHROID, LEVOTHROID) 125 MCG tablet Take 1 tablet (125 mcg total) by mouth daily. Reported on 05/28/2016 09/16/16   Nahser, Deloris Ping, MD  metoprolol tartrate (LOPRESSOR) 25 MG tablet TAKE 1 TABLET BY MOUTH TWICE A DAY    Nahser, Deloris Ping, MD  Multiple Vitamin (MULTIVITAMIN) capsule Take 1  capsule by mouth daily. Reported on 05/28/2016    [provider]  nitroGLYCERIN (NITROSTAT) 0.4 MG SL tablet PLACE 1 TABLET UNDER THE TONGUE EVERY 5 MINUTES AS NEEDED FOR CHEST PAIN. Patient not taking: Reported on 04/26/2017 08/04/16   Nahser, Deloris Ping, MD    Allergies:   Other; Pollen extract; Latex; Morphine and related; and Penicillins   Social History   Social History  . Marital status: Single    Spouse name: N/A  . Number of children: N/A  . Years of education: N/A   Social History Main Topics  . Smoking status: Never Smoker  . Smokeless tobacco: Never Used  . Alcohol use 0.6 oz/week    1 Glasses of wine per week     Comment: 09/28/2012 "1 glass of wine averages once/wk; beer average once/week during summer w/friends"  . Drug use: No  . Sexual activity: No   Other Topics Concern  . None   Social History Narrative  . None     Family History:  The patient's family history is not on file. No family history of blood clot  ROS:   Please see the history of present illness.    ROS All other systems reviewed and are negative.   PHYSICAL EXAM:   VS:  BP 130/60   Pulse 70   Ht 5' 4.25" (1.632 m)   Wt 139 lb 3.2 oz (63.1 kg)   BMI 23.71 kg/m    GEN: Well nourished, well developed, in no acute distress  HEENT: normal  Neck: no JVD, carotid bruits, or masses Cardiac: RRR; no murmurs, rubs, or gallops 2  +  edema LLE Respiratory:  clear to auscultation bilaterally, normal work of breathing GI: soft, nontender, nondistended, + BS MS: no deformity or atrophy  Skin: warm and dry, no rash Neuro:  Alert and Oriented x 3, Strength and sensation are intact Psych: euthymic mood, full affect  Wt Readings from Last 3 Encounters:  05/17/17 139 lb 3.2 oz (63.1 kg)  04/26/17 144 lb 12.8 oz (65.7 kg)  12/22/16 155 lb 3.2 oz (70.4 kg)      Studies/Labs Reviewed:   EKG:  EKG is not ordered today.   Recent Labs: 11/12/2016: ALT 13; BUN 14; Creat 0.82; Potassium 4.3;  Sodium 142; TSH 2.20   Lipid Panel    Component Value Date/Time   CHOL 192 11/12/2016 0823   TRIG 46 11/12/2016 0823   HDL 101 11/12/2016 0823   CHOLHDL 1.9 11/12/2016 0823   VLDL 9 11/12/2016 0823   LDLCALC 82 11/12/2016 0823   LDLDIRECT 127.7 01/14/2014 0901    Additional studies/ records that were reviewed today include:   AS above    ASSESSMENT & PLAN:    1. CAD s/p DES to RCA - Last cath in 2014 as above. No angina or dyspnea. Continue ASA and statin.   2. HLD - 11/12/2016: Cholesterol 192; HDL 101; LDL Cholesterol 82; Triglycerides  46; VLDL 9  - Continue statin  3. Hypothyroidism - per PCP  4. Chronic left lower extremity edema - Previously edema was exacerbated by activity and resolved with rest. Now it has been constant this year. She denies pain, orthopnea, PND, or dyspnea. Less likelihood for PE or CHF. Previously evaluated by foot MD but never followed up. Will venous doppler to r/o DVT. IF normal, may consider ortho eval.   5. Hearing loss/dizziness - No dizziness from sitting/laying to standing position. Mostly occurs  while in home and walking for few seconds with self resolution. Agree with follow-up with ENT for further evaluation.    Medication Adjustments/Labs and Tests Ordered: Current medicines are reviewed at length with the patient today.  Concerns regarding medicines are outlined above.  Medication changes, Labs and Tests ordered today are listed in the Patient Instructions below. Patient Instructions  Medication Instructions:  Your physician recommends that you continue on your current medications as directed. Please refer to the Current Medication list given to you today.  Labwork: NONE  Testing/Procedures: Your physician has requested that you have a lower extremity venous duplex be done to rule out DVT.   Follow-Up: Your physician wants you to follow-up in: 6 months with Dr. Elease Hashimoto. You will receive a reminder letter in the mail two  months in advance. If you don't receive a letter, please call our office to schedule the follow-up appointment.   If you need a refill on your cardiac medications before your next appointment, please call your pharmacy.    Deep Vein Thrombosis A deep vein thrombosis (DVT) is a blood clot (thrombus) that usually occurs in a deep, larger vein of the lower leg or the pelvis, or in an upper extremity such as the arm. These are dangerous and can lead to serious and even life-threatening complications if the clot travels to the lungs. A DVT can damage the valves in your leg veins so that instead of flowing upward, the blood pools in the lower leg. This is called post-thrombotic syndrome, and it can result in pain, swelling, discoloration, and sores on the leg. What are the causes? A DVT is caused by the formation of a blood clot in your leg, pelvis, or arm. Usually, several things contribute to the formation of blood clots. A clot may develop when:  Your blood flow slows down.  Your vein becomes damaged in some way.  You have a condition that makes your blood clot more easily.  What increases the risk? A DVT is more likely to develop in:  People who are older, especially over 9 years of age.  People who are overweight (obese).  People who sit or lie still for a long time, such as during long-distance travel (over 4 hours), bed rest, hospitalization, or during recovery from certain medical conditions like a stroke.  People who do not engage in much physical activity (sedentary lifestyle).  People who have chronic breathing disorders.  People who have a personal or family history of blood clots or blood clotting disease.  People who have peripheral vascular disease (PVD), diabetes, or some types of cancer.  People who have heart disease, especially if the person had a recent heart attack or has congestive heart failure.  People who have neurological diseases that affect the legs (leg  paresis).  People who have had a traumatic injury, such as breaking a hip or leg.  People who have recently had major or lengthy surgery, especially on the hip, knee, or abdomen.  People who have had a central line placed inside a large vein.  People who take medicines that contain the hormone estrogen. These include birth control pills and hormone replacement therapy.  Pregnancy or during childbirth or the postpartum period.  Long plane flights (over 8 hours).  What are the signs or symptoms?  Symptoms of a DVT can include:  Swelling of your leg or arm, especially if one side is much worse.  Warmth and redness of your leg or arm, especially if one side is much worse.  Pain in your arm or leg. If the clot is in your leg, symptoms may be more noticeable or worse when you stand or walk.  A feeling of pins and needles, if the clot is in the arm.  The symptoms of a DVT that has traveled to the lungs (pulmonary embolism, PE) usually start suddenly and include:  Shortness of breath while active or at rest.  Coughing or coughing up blood or blood-tinged mucus.  Chest pain that is often worse with deep breaths.  Rapid or irregular heartbeat.  Feeling light-headed or dizzy.  Fainting.  Feeling anxious.  Sweating.  There may also be pain and swelling in a leg if that is where the blood clot started. These symptoms may represent a serious problem that is an emergency. Do not wait to see if the symptoms will go away. Get medical help right away. Call your local emergency services (911 in the [place] Do not drive yourself to the hospital. How is this diagnosed? Your health care provider will take a medical history and perform a physical exam. You may also have other tests, including:  Blood tests to assess the clotting properties of your blood.  Imaging tests, such as CT, ultrasound, MRI, X-ray, and other tests to see if you have clots anywhere in your body.  How is this  treated? After a DVT is identified, it can be treated. The type of treatment that you receive depends on many factors, such as the cause of your DVT, your risk for bleeding or developing more clots, and other medical conditions that you have. Sometimes, a combination of treatments is necessary. Treatment options may be combined and include:  Monitoring the blood clot with ultrasound.  Taking medicines by mouth, such as newer blood thinners (anticoagulants), thrombolytics, or warfarin.  Taking anticoagulant medicine by injection or through an IV tube.  Wearing compression stockings or using different types ofdevices.  Surgery (rare) to remove the blood clot or to place a filter in your abdomen to stop the blood clot from traveling to your lungs.  Treatments for a DVT are often divided into immediate treatment and long-term treatment (up to 3 months after DVT). You can work with your health care provider to choose the treatment program that is best for you. Follow these instructions at home: If you are taking a newer oral anticoagulant:  Take the medicine every single day at the same time each day.  Understand what foods and drugs interact with this medicine.  Understand that there are no regular blood tests required when using this medicine.  Understand the side effects of this medicine, including excessive bruising or bleeding. Ask your health care provider or pharmacist about other possible side effects. If you are taking warfarin:  Understand how to take warfarin and know which foods can affect how warfarin works in Public relations account executive.  Understand that it is dangerous to take too much or too little warfarin. Too much warfarin increases the  risk of bleeding. Too little warfarin continues to allow the risk for blood clots.  Follow your PT and INR blood testing schedule. The PT and INR results allow your health care provider to adjust your dose of warfarin. It is very important that you have  your PT and INR tested as often as told by your health care provider.  Avoid major changes in your diet, or tell your health care provider before you change your diet. Arrange a visit with a registered dietitian to answer your questions. Many foods, especially foods that are high in vitamin K, can interfere with warfarin and affect the PT and INR results. Eat a consistent amount of foods that are high in vitamin K, such as: ? Spinach, kale, broccoli, cabbage, collard greens, turnip greens, Brussels sprouts, peas, cauliflower, seaweed, and parsley. ? Beef liver and pork liver. ? Green tea. ? Soybean oil.  Tell your health care provider about any and all medicines, vitamins, and supplements that you take, including aspirin and other over-the-counter anti-inflammatory medicines. Be especially cautious with aspirin and anti-inflammatory medicines. Do not take those before you ask your health care provider if it is safe to do so. This is important because many medicines can interfere with warfarin and affect the PT and INR results.  Do not start or stop taking any over-the-counter or prescription medicine unless your health care provider or pharmacist tells you to do so. If you take warfarin, you will also need to do these things:  Hold pressure over cuts for longer than usual.  Tell your dentist and other health care providers that you are taking warfarin before you have any procedures in which bleeding may occur.  Avoid alcohol or drink very small amounts. Tell your health care provider if you change your alcohol intake.  Do not use tobacco products, including cigarettes, chewing tobacco, and e-cigarettes. If you need help quitting, ask your health care provider.  Avoid contact sports.  General instructions  Take over-the-counter and prescription medicines only as told by your health care provider. Anticoagulant medicines can have side effects, including easy bruising and difficulty stopping  bleeding. If you are prescribed an anticoagulant, you will also need to do these things: ? Hold pressure over cuts for longer than usual. ? Tell your dentist and other health care providers that you are taking anticoagulants before you have any procedures in which bleeding may occur. ? Avoid contact sports.  Wear a medical alert bracelet or carry a medical alert card that says you have had a PE.  Ask your health care provider how soon you can go back to your normal activities. Stay active to prevent new blood clots from forming.  Make sure to exercise while traveling or when you have been sitting or standing for a long period of time. It is very important to exercise. Exercise your legs by walking or by tightening and relaxing your leg muscles often. Take frequent walks.  Wear compression stockings as told by your health care provider to help prevent more blood clots from forming.  Do not use tobacco products, including cigarettes, chewing tobacco, and e-cigarettes. If you need help quitting, ask your health care provider.  Keep all follow-up appointments with your health care provider. This is important. How is this prevented? Take these actions to decrease your risk of developing another DVT:  Exercise regularly. For at least 30 minutes every day, engage in: ? Activity that involves moving your arms and legs. ? Activity that encourages good blood  flow through your body by increasing your heart rate.  Exercise your arms and legs every hour during long-distance travel (over 4 hours). Drink plenty of water and avoid drinking alcohol while traveling.  Avoid sitting or lying in bed for long periods of time without moving your legs.  Maintain a weight that is appropriate for your height. Ask your health care provider what weight is healthy for you.  If you are a woman who is over 81 years of age, avoid unnecessary use of medicines that contain estrogen. These include birth control  pills.  Do not smoke, especially if you take estrogen medicines. If you need help quitting, ask your health care provider.  If you are hospitalized, prevention measures may include:  Early walking after surgery, as soon as your health care provider says that it is safe.  Receiving anticoagulants to prevent blood clots.If you cannot take anticoagulants, other options may be available, such as wearing compression stockings or using different types of devices.  Get help right away if:  You have new or increased pain, swelling, or redness in an arm or leg.  You have numbness or tingling in an arm or leg.  You have shortness of breath while active or at rest.  You have chest pain.  You have a rapid or irregular heartbeat.  You feel light-headed or dizzy.  You cough up blood.  You notice blood in your vomit, bowel movement, or urine. These symptoms may represent a serious problem that is an emergency. Do not wait to see if the symptoms will go away. Get medical help right away. Call your local emergency services (911 in the [place] Do not drive yourself to the hospital. This information is not intended to replace advice given to you by your health care provider. Make sure you discuss any questions you have with your health care provider. Document Released: 11/15/2005 Document Revised: 04/22/2016 Document Reviewed: 03/12/2015 Elsevier Interactive Patient Education  [address]2017 [employer]     Signed, RayneBhagat,Leno Mathes, [place]  05/17/2017 8:25 AM    [facility] [address], [place], [place]  [id number] Phone: [phone]; Fax: [phone]

## 2017-05-17 ENCOUNTER — Ambulatory Visit (INDEPENDENT_AMBULATORY_CARE_PROVIDER_SITE_OTHER): Payer: Medicare Other | Admitting: Physician Assistant

## 2017-05-17 ENCOUNTER — Encounter: Payer: Self-pay | Admitting: Physician Assistant

## 2017-05-17 VITALS — BP 130/60 | HR 70 | Ht 64.25 in | Wt 139.2 lb

## 2017-05-17 DIAGNOSIS — R42 Dizziness and giddiness: Secondary | ICD-10-CM

## 2017-05-17 DIAGNOSIS — I2510 Atherosclerotic heart disease of native coronary artery without angina pectoris: Secondary | ICD-10-CM

## 2017-05-17 DIAGNOSIS — E785 Hyperlipidemia, unspecified: Secondary | ICD-10-CM

## 2017-05-17 DIAGNOSIS — M7989 Other specified soft tissue disorders: Secondary | ICD-10-CM | POA: Diagnosis not present

## 2017-05-17 DIAGNOSIS — E039 Hypothyroidism, unspecified: Secondary | ICD-10-CM

## 2017-05-17 DIAGNOSIS — H9110 Presbycusis, unspecified ear: Secondary | ICD-10-CM

## 2017-05-17 MED ORDER — ASPIRIN EC 81 MG PO TBEC: 81.0000 mg | DELAYED_RELEASE_TABLET | Freq: Every day | ORAL | Status: DC

## 2017-05-17 NOTE — Patient Instructions (Addendum)
Medication Instructions:  Your physician has recommended you make the following change in your medication:  1- Make sure you take your Aspirin 81 mg by mouth daily  Labwork: NONE  Testing/Procedures: Your physician has requested that you have a lower extremity venous duplex be done to rule out DVT.   Follow-Up: Your physician wants you to follow-up in: 6 months with Dr. Elease Hashimoto. You will receive a reminder letter in the mail two months in advance. If you don't receive a letter, please call our office to schedule the follow-up appointment.   If you need a refill on your cardiac medications before your next appointment, please call your pharmacy.    Deep Vein Thrombosis A deep vein thrombosis (DVT) is a blood clot (thrombus) that usually occurs in a deep, larger vein of the lower leg or the pelvis, or in an upper extremity such as the arm. These are dangerous and can lead to serious and even life-threatening complications if the clot travels to the lungs. A DVT can damage the valves in your leg veins so that instead of flowing upward, the blood pools in the lower leg. This is called post-thrombotic syndrome, and it can result in pain, swelling, discoloration, and sores on the leg. What are the causes? A DVT is caused by the formation of a blood clot in your leg, pelvis, or arm. Usually, several things contribute to the formation of blood clots. A clot may develop when:  Your blood flow slows down.  Your vein becomes damaged in some way.  You have a condition that makes your blood clot more easily.  What increases the risk? A DVT is more likely to develop in:  People who are older, especially over 73 years of age.  People who are overweight (obese).  People who sit or lie still for a long time, such as during long-distance travel (over 4 hours), bed rest, hospitalization, or during recovery from certain medical conditions like a stroke.  People who do not engage in much physical  activity (sedentary lifestyle).  People who have chronic breathing disorders.  People who have a personal or family history of blood clots or blood clotting disease.  People who have peripheral vascular disease (PVD), diabetes, or some types of cancer.  People who have heart disease, especially if the person had a recent heart attack or has congestive heart failure.  People who have neurological diseases that affect the legs (leg paresis).  People who have had a traumatic injury, such as breaking a hip or leg.  People who have recently had major or lengthy surgery, especially on the hip, knee, or abdomen.  People who have had a central line placed inside a large vein.  People who take medicines that contain the hormone estrogen. These include birth control pills and hormone replacement therapy.  Pregnancy or during childbirth or the postpartum period.  Long plane flights (over 8 hours).  What are the signs or symptoms?  Symptoms of a DVT can include:  Swelling of your leg or arm, especially if one side is much worse.  Warmth and redness of your leg or arm, especially if one side is much worse.  Pain in your arm or leg. If the clot is in your leg, symptoms may be more noticeable or worse when you stand or walk.  A feeling of pins and needles, if the clot is in the arm.  The symptoms of a DVT that has traveled to the lungs (pulmonary embolism, PE) usually start  suddenly and include:  Shortness of breath while active or at rest.  Coughing or coughing up blood or blood-tinged mucus.  Chest pain that is often worse with deep breaths.  Rapid or irregular heartbeat.  Feeling light-headed or dizzy.  Fainting.  Feeling anxious.  Sweating.  There may also be pain and swelling in a leg if that is where the blood clot started. These symptoms may represent a serious problem that is an emergency. Do not wait to see if the symptoms will go away. Get medical help right away.  Call your local emergency services (911 in the [place] Do not drive yourself to the hospital. How is this diagnosed? Your health care provider will take a medical history and perform a physical exam. You may also have other tests, including:  Blood tests to assess the clotting properties of your blood.  Imaging tests, such as CT, ultrasound, MRI, X-ray, and other tests to see if you have clots anywhere in your body.  How is this treated? After a DVT is identified, it can be treated. The type of treatment that you receive depends on many factors, such as the cause of your DVT, your risk for bleeding or developing more clots, and other medical conditions that you have. Sometimes, a combination of treatments is necessary. Treatment options may be combined and include:  Monitoring the blood clot with ultrasound.  Taking medicines by mouth, such as newer blood thinners (anticoagulants), thrombolytics, or warfarin.  Taking anticoagulant medicine by injection or through an IV tube.  Wearing compression stockings or using different types ofdevices.  Surgery (rare) to remove the blood clot or to place a filter in your abdomen to stop the blood clot from traveling to your lungs.  Treatments for a DVT are often divided into immediate treatment and long-term treatment (up to 3 months after DVT). You can work with your health care provider to choose the treatment program that is best for you. Follow these instructions at home: If you are taking a newer oral anticoagulant:  Take the medicine every single day at the same time each day.  Understand what foods and drugs interact with this medicine.  Understand that there are no regular blood tests required when using this medicine.  Understand the side effects of this medicine, including excessive bruising or bleeding. Ask your health care provider or pharmacist about other possible side effects. If you are taking warfarin:  Understand how to take  warfarin and know which foods can affect how warfarin works in [employer].  Understand that it is dangerous to take too much or too little warfarin. Too much warfarin increases the risk of bleeding. Too little warfarin continues to allow the risk for blood clots.  Follow your PT and INR blood testing schedule. The PT and INR results allow your health care provider to adjust your dose of warfarin. It is very important that you have your PT and INR tested as often as told by your health care provider.  Avoid major changes in your diet, or tell your health care provider before you change your diet. Arrange a visit with a registered dietitian to answer your questions. Many foods, especially foods that are high in vitamin K, can interfere with warfarin and affect the PT and INR results. Eat a consistent amount of foods that are high in vitamin K, such as: ? Spinach, kale, broccoli, cabbage, collard greens, turnip greens, Brussels sprouts, peas, cauliflower, seaweed, and parsley. ? Beef liver and pork liver. ?  Green tea. ? Soybean oil.  Tell your health care provider about any and all medicines, vitamins, and supplements that you take, including aspirin and other over-the-counter anti-inflammatory medicines. Be especially cautious with aspirin and anti-inflammatory medicines. Do not take those before you ask your health care provider if it is safe to do so. This is important because many medicines can interfere with warfarin and affect the PT and INR results.  Do not start or stop taking any over-the-counter or prescription medicine unless your health care provider or pharmacist tells you to do so. If you take warfarin, you will also need to do these things:  Hold pressure over cuts for longer than usual.  Tell your dentist and other health care providers that you are taking warfarin before you have any procedures in which bleeding may occur.  Avoid alcohol or drink very small amounts. Tell your health  care provider if you change your alcohol intake.  Do not use tobacco products, including cigarettes, chewing tobacco, and e-cigarettes. If you need help quitting, ask your health care provider.  Avoid contact sports.  General instructions  Take over-the-counter and prescription medicines only as told by your health care provider. Anticoagulant medicines can have side effects, including easy bruising and difficulty stopping bleeding. If you are prescribed an anticoagulant, you will also need to do these things: ? Hold pressure over cuts for longer than usual. ? Tell your dentist and other health care providers that you are taking anticoagulants before you have any procedures in which bleeding may occur. ? Avoid contact sports.  Wear a medical alert bracelet or carry a medical alert card that says you have had a PE.  Ask your health care provider how soon you can go back to your normal activities. Stay active to prevent new blood clots from forming.  Make sure to exercise while traveling or when you have been sitting or standing for a long period of time. It is very important to exercise. Exercise your legs by walking or by tightening and relaxing your leg muscles often. Take frequent walks.  Wear compression stockings as told by your health care provider to help prevent more blood clots from forming.  Do not use tobacco products, including cigarettes, chewing tobacco, and e-cigarettes. If you need help quitting, ask your health care provider.  Keep all follow-up appointments with your health care provider. This is important. How is this prevented? Take these actions to decrease your risk of developing another DVT:  Exercise regularly. For at least 30 minutes every day, engage in: ? Activity that involves moving your arms and legs. ? Activity that encourages good blood flow through your body by increasing your heart rate.  Exercise your arms and legs every hour during long-distance  travel (over 4 hours). Drink plenty of water and avoid drinking alcohol while traveling.  Avoid sitting or lying in bed for long periods of time without moving your legs.  Maintain a weight that is appropriate for your height. Ask your health care provider what weight is healthy for you.  If you are a woman who is over 63 years of age, avoid unnecessary use of medicines that contain estrogen. These include birth control pills.  Do not smoke, especially if you take estrogen medicines. If you need help quitting, ask your health care provider.  If you are hospitalized, prevention measures may include:  Early walking after surgery, as soon as your health care provider says that it is safe.  Receiving anticoagulants to prevent  blood clots.If you cannot take anticoagulants, other options may be available, such as wearing compression stockings or using different types of devices.  Get help right away if:  You have new or increased pain, swelling, or redness in an arm or leg.  You have numbness or tingling in an arm or leg.  You have shortness of breath while active or at rest.  You have chest pain.  You have a rapid or irregular heartbeat.  You feel light-headed or dizzy.  You cough up blood.  You notice blood in your vomit, bowel movement, or urine. These symptoms may represent a serious problem that is an emergency. Do not wait to see if the symptoms will go away. Get medical help right away. Call your local emergency services (911 in the [place] Do not drive yourself to the hospital. This information is not intended to replace advice given to you by your health care provider. Make sure you discuss any questions you have with your health care provider. Document Released: 11/15/2005 Document Revised: 04/22/2016 Document Reviewed: 03/12/2015 Elsevier Interactive Patient Education  2017 [employer]

## 2017-05-20 ENCOUNTER — Ambulatory Visit (HOSPITAL_COMMUNITY)
Admission: RE | Admit: 2017-05-20 | Discharge: 2017-05-20 | Disposition: A | Discharge status: Home / Self Care | Payer: Medicare Other | Source: Ambulatory Visit | Attending: Internal Medicine | Admitting: Internal Medicine

## 2017-05-20 DIAGNOSIS — M7989 Other specified soft tissue disorders: Secondary | ICD-10-CM | POA: Diagnosis not present

## 2017-05-25 ENCOUNTER — Telehealth: Payer: Self-pay | Admitting: Physician Assistant

## 2017-05-25 NOTE — Telephone Encounter (Signed)
°  Follow Up   Returning call from earlier regarding results. Please call.

## 2017-05-25 NOTE — Telephone Encounter (Signed)
Patient returning your call for results, patient is driving home and will be available in about 15 mins.

## 2017-05-25 NOTE — Telephone Encounter (Signed)
[email or web address]

## 2017-05-26 ENCOUNTER — Telehealth: Payer: Self-pay | Admitting: Physician Assistant

## 2017-05-26 NOTE — Telephone Encounter (Signed)
Called patient on numbers provided. Unable to reach patient left message to give us a call back.

## 2017-05-26 NOTE — Telephone Encounter (Signed)
Called patient, unable to reach left message to give us a call back regarding lab results.

## 2017-05-26 NOTE — Telephone Encounter (Signed)
New Message      Pt was returning call from yesterday to get her test results

## 2017-06-08 ENCOUNTER — Encounter: Payer: Self-pay | Admitting: *Deleted

## 2017-06-08 DIAGNOSIS — Z006 Encounter for examination for normal comparison and control in clinical research program: Secondary | ICD-10-CM

## 2017-06-08 NOTE — Progress Notes (Signed)
Patient was seen for Day 150 [facility] Study visit.Patient is doing well with no adverse events.  Patient will bee seen for next visit on 09/28/17 at 8am.

## 2017-08-03 ENCOUNTER — Other Ambulatory Visit: Payer: Self-pay | Admitting: Primary Care

## 2017-08-03 DIAGNOSIS — I2510 Atherosclerotic heart disease of native coronary artery without angina pectoris: Secondary | ICD-10-CM

## 2017-08-03 MED ORDER — LEVOTHYROXINE SODIUM 125 MCG PO TABS: ORAL_TABLET | ORAL | 0 refills | Status: DC

## 2017-08-03 NOTE — Telephone Encounter (Signed)
Please notify patient that I am happy to refill her levothyroxine. She will be due for repeat thyroid check in December, please schedule her for an office visit for follow-up labs during that time.

## 2017-08-03 NOTE — Telephone Encounter (Signed)
On the walk-in request form, patient is requesting refill of levothyroxine (SYNTHROID, LEVOTHROID) 125 MCG tablet.  Medication have not been prescribed by [facility]. Patient's new patient appt was on 04/26/2017. No future appt.

## 2017-08-04 NOTE — Telephone Encounter (Signed)
Message left for patient to return my call.  

## 2017-08-12 NOTE — Telephone Encounter (Signed)
Message left for patient to return my call.  

## 2017-08-19 NOTE — Telephone Encounter (Signed)
Message left for patient to return my call.  Send patient a message through MyChart 

## 2017-09-09 ENCOUNTER — Encounter: Payer: Self-pay | Admitting: Family Medicine

## 2017-09-09 ENCOUNTER — Ambulatory Visit (INDEPENDENT_AMBULATORY_CARE_PROVIDER_SITE_OTHER): Payer: Medicare Other | Admitting: Family Medicine

## 2017-09-09 VITALS — BP 140/64 | HR 74 | Temp 97.6°F | Ht 62.25 in | Wt 136.0 lb

## 2017-09-09 DIAGNOSIS — Z7689 Persons encountering health services in other specified circumstances: Secondary | ICD-10-CM

## 2017-09-09 DIAGNOSIS — J302 Other seasonal allergic rhinitis: Secondary | ICD-10-CM | POA: Diagnosis not present

## 2017-09-09 DIAGNOSIS — E039 Hypothyroidism, unspecified: Secondary | ICD-10-CM | POA: Diagnosis not present

## 2017-09-09 DIAGNOSIS — I25119 Atherosclerotic heart disease of native coronary artery with unspecified angina pectoris: Secondary | ICD-10-CM

## 2017-09-09 NOTE — Patient Instructions (Addendum)
Health Maintenance, Female Adopting a healthy lifestyle and getting preventive care can go a long way to promote health and wellness. Talk with your health care provider about what schedule of regular examinations is right for you. This is a good chance for you to check in with your provider about disease prevention and staying healthy. In between checkups, there are plenty of things you can do on your own. Experts have done a lot of research about which lifestyle changes and preventive measures are most likely to keep you healthy. Ask your health care provider for more information. Weight and diet Eat a healthy diet  Be sure to include plenty of vegetables, fruits, low-fat dairy products, and lean protein.  Do not eat a lot of foods high in solid fats, added sugars, or salt.  Get regular exercise. This is one of the most important things you can do for your health. ? Most adults should exercise for at least 150 minutes each week. The exercise should increase your heart rate and make you sweat (moderate-intensity exercise). ? Most adults should also do strengthening exercises at least twice a week. This is in addition to the moderate-intensity exercise.  Maintain a healthy weight  Body mass index (BMI) is a measurement that can be used to identify possible weight problems. It estimates body fat based on height and weight. Your health care provider can help determine your BMI and help you achieve or maintain a healthy weight.  For females 20 years of age and older: ? A BMI below 18.5 is considered underweight. ? A BMI of 18.5 to 24.9 is normal. ? A BMI of 25 to 29.9 is considered overweight. ? A BMI of 30 and above is considered obese.  Watch levels of cholesterol and blood lipids  You should start having your blood tested for lipids and cholesterol at 81 years of age, then have this test every 5 years.  You may need to have your cholesterol levels checked more often if: ? Your lipid or  cholesterol levels are high. ? You are older than 81 years of age. ? You are at high risk for heart disease.  Cancer screening Lung Cancer  Lung cancer screening is recommended for adults 55-80 years old who are at high risk for lung cancer because of a history of smoking.  A yearly low-dose CT scan of the lungs is recommended for people who: ? Currently smoke. ? Have quit within the past 15 years. ? Have at least a 30-pack-year history of smoking. A pack year is smoking an average of one pack of cigarettes a day for 1 year.  Yearly screening should continue until it has been 15 years since you quit.  Yearly screening should stop if you develop a health problem that would prevent you from having lung cancer treatment.  Breast Cancer  Practice breast self-awareness. This means understanding how your breasts normally appear and feel.  It also means doing regular breast self-exams. Let your health care provider know about any changes, no matter how small.  If you are in your 20s or 30s, you should have a clinical breast exam (CBE) by a health care provider every 1-3 years as part of a regular health exam.  If you are 40 or older, have a CBE every year. Also consider having a breast X-ray (mammogram) every year.  If you have a family history of breast cancer, talk to your health care provider about genetic screening.  If you are at high risk   for breast cancer, talk to your health care provider about having an MRI and a mammogram every year.  Breast cancer gene (BRCA) assessment is recommended for women who have family members with BRCA-related cancers. BRCA-related cancers include: ? Breast. ? Ovarian. ? Tubal. ? Peritoneal cancers.  Results of the assessment will determine the need for genetic counseling and BRCA1 and BRCA2 testing.  Cervical Cancer Your health care provider may recommend that you be screened regularly for cancer of the pelvic organs (ovaries, uterus, and  vagina). This screening involves a pelvic examination, including checking for microscopic changes to the surface of your cervix (Pap test). You may be encouraged to have this screening done every 3 years, beginning at age 22.  For women ages 56-65, health care providers may recommend pelvic exams and Pap testing every 3 years, or they may recommend the Pap and pelvic exam, combined with testing for human papilloma virus (HPV), every 5 years. Some types of HPV increase your risk of cervical cancer. Testing for HPV may also be done on women of any age with unclear Pap test results.  Other health care providers may not recommend any screening for nonpregnant women who are considered low risk for pelvic cancer and who do not have symptoms. Ask your health care provider if a screening pelvic exam is right for you.  If you have had past treatment for cervical cancer or a condition that could lead to cancer, you need Pap tests and screening for cancer for at least 20 years after your treatment. If Pap tests have been discontinued, your risk factors (such as having a new sexual partner) need to be reassessed to determine if screening should resume. Some women have medical problems that increase the chance of getting cervical cancer. In these cases, your health care provider may recommend more frequent screening and Pap tests.  Colorectal Cancer  This type of cancer can be detected and often prevented.  Routine colorectal cancer screening usually begins at 81 years of age and continues through 81 years of age.  Your health care provider may recommend screening at an earlier age if you have risk factors for colon cancer.  Your health care provider may also recommend using home test kits to check for hidden blood in the stool.  A small camera at the end of a tube can be used to examine your colon directly (sigmoidoscopy or colonoscopy). This is done to check for the earliest forms of colorectal  cancer.  Routine screening usually begins at age 33.  Direct examination of the colon should be repeated every 5-10 years through 81 years of age. However, you may need to be screened more often if early forms of precancerous polyps or small growths are found.  Skin Cancer  Check your skin from head to toe regularly.  Tell your health care provider about any new moles or changes in moles, especially if there is a change in a mole's shape or color.  Also tell your health care provider if you have a mole that is larger than the size of a pencil eraser.  Always use sunscreen. Apply sunscreen liberally and repeatedly throughout the day.  Protect yourself by wearing long sleeves, pants, a wide-brimmed hat, and sunglasses whenever you are outside.  Heart disease, diabetes, and high blood pressure  High blood pressure causes heart disease and increases the risk of stroke. High blood pressure is more likely to develop in: ? People who have blood pressure in the high end of  the normal range (130-139/85-89 mm Hg). ? People who are overweight or obese. ? People who are African American.  If you are 38-24 years of age, have your blood pressure checked every 3-5 years. If you are 25 years of age or older, have your blood pressure checked every year. You should have your blood pressure measured twice-once when you are at a hospital or clinic, and once when you are not at a hospital or clinic. Record the average of the two measurements. To check your blood pressure when you are not at a hospital or clinic, you can use: ? An automated blood pressure machine at a pharmacy. ? A home blood pressure monitor.  If you are between 37 years and 58 years old, ask your health care provider if you should take aspirin to prevent strokes.  Have regular diabetes screenings. This involves taking a blood sample to check your fasting blood sugar level. ? If you are at a normal weight and have a low risk for diabetes,  have this test once every three years after 81 years of age. ? If you are overweight and have a high risk for diabetes, consider being tested at a younger age or more often. Preventing infection Hepatitis B  If you have a higher risk for hepatitis B, you should be screened for this virus. You are considered at high risk for hepatitis B if: ? You were born in a country where hepatitis B is common. Ask your health care provider which countries are considered high risk. ? Your parents were born in a high-risk country, and you have not been immunized against hepatitis B (hepatitis B vaccine). ? You have HIV or AIDS. ? You use needles to inject street drugs. ? You live with someone who has hepatitis B. ? You have had sex with someone who has hepatitis B. ? You get hemodialysis treatment. ? You take certain medicines for conditions, including cancer, organ transplantation, and autoimmune conditions.  Hepatitis C  Blood testing is recommended for: ? Everyone born from [birth year] through 1963. ? Anyone with known risk factors for hepatitis C.  Sexually transmitted infections (STIs)  You should be screened for sexually transmitted infections (STIs) including gonorrhea and chlamydia if: ? You are sexually active and are younger than 81 years of age. ? You are older than 81 years of age and your health care provider tells you that you are at risk for this type of infection. ? Your sexual activity has changed since you were last screened and you are at an increased risk for chlamydia or gonorrhea. Ask your health care provider if you are at risk.  If you do not have HIV, but are at risk, it may be recommended that you take a prescription medicine daily to prevent HIV infection. This is called pre-exposure prophylaxis (PrEP). You are considered at risk if: ? You are sexually active and do not regularly use condoms or know the HIV status of your partner(s). ? You take drugs by injection. ? You are  sexually active with a partner who has HIV.  Talk with your health care provider about whether you are at high risk of being infected with HIV. If you choose to begin PrEP, you should first be tested for HIV. You should then be tested every 3 months for as long as you are taking PrEP. Pregnancy  If you are premenopausal and you may become pregnant, ask your health care provider about preconception counseling.  If you may become  pregnant, take 400 to 800 micrograms (mcg) of folic acid every day.  If you want to prevent pregnancy, talk to your health care provider about birth control (contraception). Osteoporosis and menopause  Osteoporosis is a disease in which the bones lose minerals and strength with aging. This can result in serious bone fractures. Your risk for osteoporosis can be identified using a bone density scan.  If you are 96 years of age or older, or if you are at risk for osteoporosis and fractures, ask your health care provider if you should be screened.  Ask your health care provider whether you should take a calcium or vitamin D supplement to lower your risk for osteoporosis.  Menopause may have certain physical symptoms and risks.  Hormone replacement therapy may reduce some of these symptoms and risks. Talk to your health care provider about whether hormone replacement therapy is right for you. Follow these instructions at home:  Schedule regular health, dental, and eye exams.  Stay current with your immunizations.  Do not use any tobacco products including cigarettes, chewing tobacco, or electronic cigarettes.  If you are pregnant, do not drink alcohol.  If you are breastfeeding, limit how much and how often you drink alcohol.  Limit alcohol intake to no more than 1 drink per day for nonpregnant women. One drink equals 12 ounces of beer, 5 ounces of wine, or 1 ounces of hard liquor.  Do not use street drugs.  Do not share needles.  Ask your health care  provider for help if you need support or information about quitting drugs.  Tell your health care provider if you often feel depressed.  Tell your health care provider if you have ever been abused or do not feel safe at home. This information is not intended to replace advice given to you by your health care provider. Make sure you discuss any questions you have with your health care provider. Document Released: 05/31/2011 Document Revised: 04/22/2016 Document Reviewed: 08/19/2015 Elsevier Interactive Patient Education  2018 [employer].  Hypothyroidism Hypothyroidism is a disorder of the thyroid. The thyroid is a large gland that is located in the lower front of the neck. The thyroid releases hormones that control how the body works. With hypothyroidism, the thyroid does not make enough of these hormones. What are the causes? Causes of hypothyroidism may include:  Viral infections.  Pregnancy.  Your own defense system (immune system) attacking your thyroid.  Certain medicines.  Birth defects.  Past radiation treatments to your head or neck.  Past treatment with radioactive iodine.  Past surgical removal of part or all of your thyroid.  Problems with the gland that is located in the center of your brain (pituitary).  What are the signs or symptoms? Signs and symptoms of hypothyroidism may include:  Feeling as though you have no energy (lethargy).  Inability to tolerate cold.  Weight gain that is not explained by a change in diet or exercise habits.  Dry skin.  Coarse hair.  Menstrual irregularity.  Slowing of thought processes.  Constipation.  Sadness or depression.  How is this diagnosed? Your health care provider may diagnose hypothyroidism with blood tests and ultrasound tests. How is this treated? Hypothyroidism is treated with medicine that replaces the hormones that your body does not make. After you begin treatment, it may take several weeks for  symptoms to go away. Follow these instructions at home:  Take medicines only as directed by your health care provider.  If you start taking  any new medicines, tell your health care provider.  Keep all follow-up visits as directed by your health care provider. This is important. As your condition improves, your dosage needs may change. You will need to have blood tests regularly so that your health care provider can watch your condition. Contact a health care provider if:  Your symptoms do not get better with treatment.  You are taking thyroid replacement medicine and: ? You sweat excessively. ? You have tremors. ? You feel anxious. ? You lose weight rapidly. ? You cannot tolerate heat. ? You have emotional swings. ? You have diarrhea. ? You feel weak. Get help right away if:  You develop chest pain.  You develop an irregular heartbeat.  You develop a rapid heartbeat. This information is not intended to replace advice given to you by your health care provider. Make sure you discuss any questions you have with your health care provider. Document Released: 11/15/2005 Document Revised: 04/22/2016 Document Reviewed: 04/02/2014 Elsevier Interactive Patient Education  2017 [employer].

## 2017-09-09 NOTE — Progress Notes (Signed)
Patient presents to clinic today to establish care.  SUBJECTIVE: PMH: Pt is an 83 yo female with pmh sig for CAD, hypothyroidism, glaucoma, HLD.  Pt is seen by Cardiology, Drs. Ambulance person.  Pt's last visit to a PCP was maybe 8 yrs ago.  Of note, pt is a part of a LDL cholesterol study with Cardiology.  Seasonal allergies: -pt endorses more issues with allergies since moving from [place]" -just "lets mother nature drain things out" -not interested in medication at this time -endorses rhinorrhea, more with change of the season  Hypothyroidism: -Taking Levothyroxine 125 mcg daily. -Pt states generic works better for her than name brand.  Pt takes at 2 am when she gets up at night to urinate. -Denies diarrhea, constipation, HA, palpitations. Endorses always feeling cold/wearing a English as a second language teacher. -unsure when TSH was checked  Eye issues: -pt can't recall what is going on with her eyes -Followed by [facility] [facility] of  -Hx of eye surgeries including Cataracts (done in [place]) -uses Lumigan eye gtt in both eyes at bedtime and Combigan 1 gtt both eyes BID -feels like medicines may not be working as she feels like she now has to wear her glasses to read, which is new for her.  Allergies: Latex- itching Morphine- rash PCN-rash  PSurgHx: Breast reduction -per pt "im not happy with the results" Eye surgeries- cataracts removed in [place], [place]  Social Hx: Pt is originally from [place].  Pt went to [employer] for [place].  She later obtained a Master's in [employer].  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  Pt never married.  She has 1 daughter.  Pt recounts while in [place] finding out the FOB was married.  Pt denies tobacco, EtOH, and drug use.  FMHx: "wonderfully healthy family" per pt. Mom-desc Dad-arthritis Brother- Pension scheme manager- desc Brother Imari  Health  Maintenance: Dentist- [employer]. Eye Doctor: Blake Divine.  [phone] Never had a shingles vaccine Last colonoscopy was in pt's 50s    Past Medical History:  Diagnosis Date  . Anginal pain (HCC)    1990's; 09/28/2012  . Arthritis    "very mild" (09/28/2012)  . Coronary artery disease 09/27/2012   s/p PCI of the RCA with DES with remote PCI in [place] 15 to 20 years ago. 2. cath 02/2013 patent mid RCA stent, 30% left main, 70% mid to distaal tanden 70% lesions x 3, 60% mid diag, 50% ostial LCx, 40% mid LCx.   . Glaucoma   . Hyperlipidemia   . Hypothyroidism     Past Surgical History:  Procedure Laterality Date  . CARDIAC CATHETERIZATION  1990s   PCI  . CATARACT EXTRACTION W/ INTRAOCULAR LENS  IMPLANT, BILATERAL  1970's  . CORONARY ANGIOPLASTY WITH STENT PLACEMENT  09/28/2012   20-30% diffuse LAD stenosis, mild-mod D1 dz, 70% distal D1 lesion, mild diffuse LCx dz, 50-60% OM1 stenosis, 60% mid & 90% mid-dis RCA stenosis s/p DES; LVEF 75-80% w/ hyperdynamic fxn.   . DENTAL SURGERY     "implants"  . INNER EAR SURGERY  ~ 2010   "put plugs in so I could fly" (09/28/2012)  . LEFT HEART CATHETERIZATION WITH CORONARY ANGIOGRAM N/A 09/28/2012   Procedure: LEFT HEART CATHETERIZATION WITH CORONARY ANGIOGRAM;  Surgeon: [name], MD;  Location: [facility] CATH LAB;  Service: Cardiovascular;  Laterality: N/A;  . LUMBAR DISC SURGERY  1980's   "  took out a little piece" (09/28/2012)  . PERCUTANEOUS CORONARY STENT INTERVENTION (PCI-S)  09/28/2012   Procedure: PERCUTANEOUS CORONARY STENT INTERVENTION (PCI-S);  Surgeon: [name], MD;  Location: [facility] CATH LAB;  Service: Cardiovascular;;  . REDUCTION MAMMAPLASTY  1970's    Current Outpatient Prescriptions on File Prior to Visit  Medication Sig Dispense Refill  . AMBULATORY NON FORMULARY MEDICATION Take 300 mg by mouth as directed. Medication Name: Inclirisan sodium  vs placebo (orion-10 Study per protocol    .  atorvastatin (LIPITOR) 40 MG tablet TAKE 1 TABLET BY MOUTH EVERY DAY 30 tablet 9  . dorzolamide-timolol (COSOPT) 22.3-6.8 MG/ML ophthalmic solution Place 1 drop into both eyes daily.    Marland Kitchen levothyroxine (SYNTHROID, LEVOTHROID) 125 MCG tablet Take 1 tablet by mouth every morning on an empty stomach with a full glass of water. 90 tablet 0  . Multiple Vitamin (MULTIVITAMIN) capsule Take 1 capsule by mouth daily. Reported on 05/28/2016    . aspirin EC 81 MG tablet Take 1 tablet (81 mg total) by mouth daily. (Patient not taking: Reported on 09/09/2017)    . nitroGLYCERIN (NITROSTAT) 0.4 MG SL tablet PLACE 1 TABLET UNDER THE TONGUE EVERY 5 MINUTES AS NEEDED FOR CHEST PAIN. (Patient not taking: Reported on 09/09/2017) 25 tablet 6   No current facility-administered medications on file prior to visit.     Allergies  Allergen Reactions  . Other Other (See Comments)    Metals; "skin gets weepy; gets worse if contact >s" (09/28/2012)  . Pollen Extract Other (See Comments)    "sneezing; watery eyes"  . Latex Itching and Swelling  . Morphine And Related Rash  . Penicillins Rash    History reviewed. No pertinent family history.  Social History   Social History  . Marital status: Single    Spouse name: N/A  . Number of children: N/A  . Years of education: N/A   Occupational History  . Not on file.   Social History Main Topics  . Smoking status: Never Smoker  . Smokeless tobacco: Never Used  . Alcohol use 0.6 oz/week    1 Glasses of wine per week     Comment: 09/28/2012 "1 glass of wine averages once/wk; beer average once/week during summer w/friends"  . Drug use: No  . Sexual activity: No   Other Topics Concern  . Not on file   Social History Narrative  . No narrative on file    ROS General: Denies fever, chills, night sweats, changes in weight, changes in appetite HEENT: Denies headaches, ear pain, changes in vision, sore throat  +seasonal allergies-rhinorrhea CV: Denies CP,  palpitations, SOB, orthopnea Pulm: Denies SOB, cough, wheezing GI: Denies abdominal pain, nausea, vomiting, diarrhea, constipation GU: Denies dysuria, hematuria, frequency, vaginal discharge Msk: Denies muscle cramps, joint pains Neuro: Denies weakness, numbness, tingling Skin: Denies rashes, bruising Psych: Denies depression, anxiety, hallucinations  BP 140/64 (BP Location: Right Arm, Cuff Size: Normal)   Pulse 74   Temp 97.6 F (36.4 C) (Oral)   Ht 5' 2.25" (1.581 m)   Wt 136 lb (61.7 kg)   BMI 24.68 kg/m   Physical Exam Gen. Pleasant, well developed, well-nourished, in NAD HEENT - [place]/AT, eyes bulging, PERRL, no scleral icterus, clear nasal drainage, pharynx without erythema or exudate. Neck: No JVD, no thyromegaly Lungs: no accessory muscle use, CTAB, no wheezes, rales or rhonchi Cardiovascular: RRR, No r/g/m, no peripheral edema Abdomen: BS present, soft, nontender,nondistended, no hepatosplenomegaly Musculoskeletal: No deformities, moves all four extremities, no  cyanosis or clubbing, normal tone Neuro:  A&Ox3, CN II-XII intact, normal gait Skin:  Warm, dry, intact, no lesions Psych: normal affect, mood appropriate   No results found for this or any previous visit (from the past 2160 hour(s)).  Assessment/Plan: Hypothyroidism, unspecified type -Taking levothyroxine 125 mcg -last TSH 2.20 on 11/12/2016 -discussed need to recheck TSH.  Pt wishes to wait until next OFV -Advised to make office aware of any symptoms until then.  Seasonal allergies -offered medication, but pt declined -advised if becomes more of a problem can try medication.  Coronary artery disease involving native coronary artery of native heart with angina pectoris (HCC) -Pt is not taking ASA 81 mg  -Advised to check meds at home to see if she is taking Lipitor as unsure. -Continue f/u with Cardiology  Encounter to establish care -records release   F/u in 1-2 months for other concerns

## 2017-09-28 ENCOUNTER — Encounter: Payer: Self-pay | Admitting: *Deleted

## 2017-09-28 DIAGNOSIS — Z006 Encounter for examination for normal comparison and control in clinical research program: Secondary | ICD-10-CM

## 2017-09-28 NOTE — Progress Notes (Signed)
[facility].  No c/o, aes or saes to report.  Subject received injection and waited > 30 minutes to leave.  Next appointment scheduled.

## 2017-10-03 ENCOUNTER — Ambulatory Visit (INDEPENDENT_AMBULATORY_CARE_PROVIDER_SITE_OTHER): Payer: Medicare Other | Admitting: Internal Medicine

## 2017-10-03 ENCOUNTER — Encounter: Payer: Self-pay | Admitting: Internal Medicine

## 2017-10-03 VITALS — BP 130/68 | Temp 98.5°F | Ht 62.25 in | Wt 137.2 lb

## 2017-10-03 DIAGNOSIS — M542 Cervicalgia: Secondary | ICD-10-CM | POA: Diagnosis not present

## 2017-10-03 MED ORDER — METHYLPREDNISOLONE ACETATE 80 MG/ML IJ SUSP
40.0000 mg | Freq: Once | INTRAMUSCULAR | Status: AC
  Administered 2017-10-03: 40 mg via INTRAMUSCULAR

## 2017-10-03 NOTE — Progress Notes (Signed)
Subjective:    Patient ID: Sabrina Hart, female    DOB: 1932-05-13, 83 y.o.   MRN: [id number]  HPI 83 year old patient who was attempting to lift some heavy boxes beneath a table 2 weeks ago.  She stood and struck the occipital area of her head on the underside of a table.  She had immediate pain in the head and neck area but no radicular symptoms down the arm.  Over the past week she has had persistent neck pain with marked decreased range of motion in all spheres.  Pain is aggravated by head turning especially extension and movement both left and right Denies any arm weakness or paresthesias;  no prior history of chronic neck pain.  Past Medical History:  Diagnosis Date  . Anginal pain (HCC)    1990's; 09/28/2012  . Arthritis    "very mild" (09/28/2012)  . Coronary artery disease 09/27/2012   s/p PCI of the RCA with DES with remote PCI in [place] 15 to 20 years ago. 2. cath 02/2013 patent mid RCA stent, 30% left main, 70% mid to distaal tanden 70% lesions x 3, 60% mid diag, 50% ostial LCx, 40% mid LCx.   . Glaucoma   . Hyperlipidemia   . Hypothyroidism      Social History   Socioeconomic History  . Marital status: Single    Spouse name: Not on file  . Number of children: Not on file  . Years of education: Not on file  . Highest education level: Not on file  Social Needs  . Financial resource strain: Not on file  . Food insecurity - worry: Not on file  . Food insecurity - inability: Not on file  . Transportation needs - medical: Not on file  . Transportation needs - non-medical: Not on file  Occupational History  . Not on file  Tobacco Use  . Smoking status: Never Smoker  . Smokeless tobacco: Never Used  Substance and Sexual Activity  . Alcohol use: Yes    Alcohol/week: 0.6 oz    Types: 1 Glasses of wine per week    Comment: 09/28/2012 "1 glass of wine averages once/wk; beer average once/week during summer w/friends"  . Drug use: No  . Sexual activity: No    Other Topics Concern  . Not on file  Social History Narrative  . Not on file    Past Surgical History:  Procedure Laterality Date  . CARDIAC CATHETERIZATION  1990s   PCI  . CATARACT EXTRACTION W/ INTRAOCULAR LENS  IMPLANT, BILATERAL  1970's  . CORONARY ANGIOPLASTY WITH STENT PLACEMENT  09/28/2012   20-30% diffuse LAD stenosis, mild-mod D1 dz, 70% distal D1 lesion, mild diffuse LCx dz, 50-60% OM1 stenosis, 60% mid & 90% mid-dis RCA stenosis s/p DES; LVEF 75-80% w/ hyperdynamic fxn.   . DENTAL SURGERY     "implants"  . INNER EAR SURGERY  ~ 2010   "put plugs in so I could fly" (09/28/2012)  . LUMBAR DISC SURGERY  1980's   "took out a little piece" (09/28/2012)  . REDUCTION MAMMAPLASTY  1970's    History reviewed. No pertinent family history.  Allergies  Allergen Reactions  . Other Other (See Comments)    Metals; "skin gets weepy; gets worse if contact >s" (09/28/2012)  . Pollen Extract Other (See Comments)    "sneezing; watery eyes"  . Latex Itching and Swelling  . Morphine And Related Rash  . Penicillins Rash    Current Outpatient Medications on File Prior  to Visit  Medication Sig Dispense Refill  . AMBULATORY NON FORMULARY MEDICATION Take 300 mg by mouth as directed. Medication Name: Inclirisan sodium 300mg  vs placebo (orion-10 Study per protocol    . aspirin EC 81 MG tablet Take 1 tablet (81 mg total) by mouth daily.    Marland Kitchen. atorvastatin (LIPITOR) 40 MG tablet TAKE 1 TABLET BY MOUTH EVERY DAY 30 tablet 9  . COMBIGAN 0.2-0.5 % ophthalmic solution PLACE 1 DROP IN AFFECTED EYE(S) TWICE DAILY  6  . dorzolamide-timolol (COSOPT) 22.3-6.8 MG/ML ophthalmic solution Place 1 drop into both eyes daily.    Marland Kitchen. levothyroxine (SYNTHROID, LEVOTHROID) 125 MCG tablet Take 1 tablet by mouth every morning on an empty stomach with a full glass of water. 90 tablet 0  . LUMIGAN 0.01 % SOLN PLACE 1 DROP IN AFFECTED EYE(S) EVERY EVENING  6  . Multiple Vitamin (MULTIVITAMIN) capsule Take 1 capsule by  mouth daily. Reported on 05/28/2016    . nitroGLYCERIN (NITROSTAT) 0.4 MG SL tablet PLACE 1 TABLET UNDER THE TONGUE EVERY 5 MINUTES AS NEEDED FOR CHEST PAIN. 25 tablet 6   No current facility-administered medications on file prior to visit.     BP 130/68 (BP Location: Left Arm, Patient Position: Sitting, Cuff Size: Normal)   Temp 98.5 F (36.9 C) (Oral)   Ht 5' 2.25" (1.581 m)   Wt 137 lb 3.2 oz (62.2 kg)   BMI 24.89 kg/m      Review of Systems  Constitutional: Negative.   HENT: Negative for congestion, dental problem, hearing loss, rhinorrhea, sinus pressure, sore throat and tinnitus.   Eyes: Negative for pain, discharge and visual disturbance.  Respiratory: Negative for cough and shortness of breath.   Cardiovascular: Negative for chest pain, palpitations and leg swelling.  Gastrointestinal: Negative for abdominal distention, abdominal pain, blood in stool, constipation, diarrhea, nausea and vomiting.  Genitourinary: Negative for difficulty urinating, dysuria, flank pain, frequency, hematuria, pelvic pain, urgency, vaginal bleeding, vaginal discharge and vaginal pain.  Musculoskeletal: Positive for neck pain and neck stiffness. Negative for arthralgias, gait problem and joint swelling.  Skin: Negative for rash.  Neurological: Negative for dizziness, syncope, speech difficulty, weakness, numbness and headaches.  Hematological: Negative for adenopathy.  Psychiatric/Behavioral: Negative for agitation, behavioral problems and dysphoric mood. The patient is not nervous/anxious.        Objective:   Physical Exam  Constitutional: She is oriented to person, place, and time. She appears well-developed and well-nourished. No distress.  Neck:  Moderate decreased range of motion especially with extension and head movement to the left and right. The patient was able to have full flexion with minimal discomfort Posterior and lateral neck musculature tender to palpation  Neurological: She  is alert and oriented to person, place, and time.  Normal arm strength and reflexes          Assessment & Plan:   Cervical strain.  Patient was given Depo-Medrol 40 mg IM Cervical collar recommended Heat therapy gentle massage and stretching exercises encouraged  Follow-up if unimproved  Rogelia BogaKWIATKOWSKI,Sabrina Hart

## 2017-10-03 NOTE — Patient Instructions (Addendum)
Cervical Sprain A cervical sprain is a stretch or tear in the tissues that connect bones (ligaments) in the neck. Most neck (cervical) sprains get better in 4-6 weeks. Follow these instructions at home: If you have a neck collar:  Wear it as told by your doctor. Do not take off (do not remove) the collar unless your doctor says that this is safe.  Ask your doctor before adjusting your collar.  If you have long hair, keep it outside of the collar.  Ask your doctor if you may take off the collar for cleaning and bathing. If you may take off the collar: ? Follow instructions from your doctor about how to take off the collar safely. ? Clean the collar by wiping it with mild soap and water. Let it air-dry all the way. ? If your collar has removable pads:  Take the pads out every 1-2 days.  Hand wash the pads with soap and water.  Let the pads air-dry all the way before you put them back in the collar. Do not dry them in a clothes dryer. Do not dry them with a hair dryer. ? Check your skin under the collar for irritation or sores. If you see any, tell your doctor. Managing pain, stiffness, and swelling  Use a cervical traction device, if told by your doctor.  If told, put heat on the affected area. Do this before exercises (physical therapy) or as often as told by your doctor. Use the heat source that your doctor recommends, such as a moist heat pack or a heating pad. ? Place a towel between your skin and the heat source. ? Leave the heat on for 20-30 minutes. ? Take the heat off (remove the heat) if your skin turns bright red. This is very important if you cannot feel pain, heat, or cold. You may have a greater risk of getting burned.  Put ice on the affected area. ? Put ice in a plastic bag. ? Place a towel between your skin and the bag. ? Leave the ice on for 20 minutes, 2-3 times a day. Activity  Do not drive while wearing a neck collar. If you do not have a neck collar, ask your  doctor if it is safe to drive.  Do not drive or use heavy machinery while taking prescription pain medicine or muscle relaxants, unless your doctor approves.  Do not lift anything that is heavier than 10 lb (4.5 kg) until your doctor tells you that it is safe.  Rest as told by your doctor.  Avoid activities that make you feel worse. Ask your doctor what activities are safe for you.  Do exercises as told by your doctor or physical therapist. Preventing neck sprain  Practice good posture. Adjust your workstation to help with this, if needed.  Exercise regularly as told by your doctor or physical therapist.  Avoid activities that are risky or may cause a neck sprain (cervical sprain). General instructions  Take over-the-counter and prescription medicines only as told by your doctor.  Do not use any products that contain nicotine or tobacco. This includes cigarettes and e-cigarettes. If you need help quitting, ask your doctor.  Keep all follow-up visits as told by your doctor. This is important. Contact a doctor if:  You have pain or other symptoms that get worse.  You have symptoms that do not get better after 2 weeks.  You have pain that does not get better with medicine.  You start to have new,  unexplained symptoms.  You have sores or irritated skin from wearing your neck collar. Get help right away if:  You have very bad pain.  You have any of the following in any part of your body: ? Loss of feeling (numbness). ? Tingling. ? Weakness.  You cannot move a part of your body (you have paralysis).  Your activity level does not improve. Summary  A cervical sprain is a stretch or tear in the tissues that connect bones (ligaments) in the neck.  If you have a neck (cervical) collar, do not take off the collar unless your doctor says that this is safe.  Put ice on affected areas as told by your doctor.  Put heat on affected areas as told by your doctor.  Good posture  and regular exercise can help prevent a neck sprain from happening again.   You  may move around, but avoid painful motions and activities.  Apply ice to the sore area for 15 to 20 minutes 3 or 4 times daily for the next two to 3 days.  General stretching and range of motion exercises  Gentle massage

## 2017-10-12 ENCOUNTER — Telehealth: Payer: Self-pay

## 2017-10-12 NOTE — Telephone Encounter (Signed)
Copied from CRM [id number]. Topic: General - Other >> Oct 12, 2017 11:23 AM Leafy Roobinson, Sabrina Hart wrote: Pt saw dr banks as new pt on 09/09/17. Pt also saw dr Kirtland Bouchardk on 10-03-17 with some neck issues. Pt is requesting phone consult with dr banks. Pt unable to come in office.

## 2017-10-12 NOTE — Telephone Encounter (Signed)
Spoke with patient regarding neck pain and she wants to know what can she do to help her neck heal faster so she stop wearing the brace and get back to daily activities? Also patient would like to know is their any neck excercises you would recommend her trying? Patient states that she can not come into the office due to the fact that she does not feel comfortable driving and she does not have anyone to drive her.

## 2017-10-18 NOTE — Telephone Encounter (Signed)
Please call and check on our friend.  See how her neck is feeling.  Thanks.

## 2017-10-18 NOTE — Telephone Encounter (Signed)
Left a VM for patient to give the office a call back.  

## 2017-10-19 NOTE — Telephone Encounter (Signed)
Left a VM for patient to give the office a call back.  

## 2017-10-29 ENCOUNTER — Emergency Department (HOSPITAL_COMMUNITY)
Admission: EM | Admit: 2017-10-29 | Discharge: 2017-10-29 | Disposition: A | Discharge status: Home / Self Care | Payer: Medicare Other | Attending: Emergency Medicine | Admitting: Emergency Medicine

## 2017-10-29 ENCOUNTER — Encounter (HOSPITAL_COMMUNITY): Payer: Self-pay | Admitting: Emergency Medicine

## 2017-10-29 ENCOUNTER — Emergency Department (HOSPITAL_COMMUNITY): Payer: Medicare Other

## 2017-10-29 ENCOUNTER — Other Ambulatory Visit: Payer: Self-pay | Admitting: Primary Care

## 2017-10-29 DIAGNOSIS — Z7982 Long term (current) use of aspirin: Secondary | ICD-10-CM | POA: Diagnosis not present

## 2017-10-29 DIAGNOSIS — Z91040 Latex allergy status: Secondary | ICD-10-CM | POA: Diagnosis not present

## 2017-10-29 DIAGNOSIS — E039 Hypothyroidism, unspecified: Secondary | ICD-10-CM | POA: Diagnosis not present

## 2017-10-29 DIAGNOSIS — I2510 Atherosclerotic heart disease of native coronary artery without angina pectoris: Secondary | ICD-10-CM | POA: Diagnosis not present

## 2017-10-29 DIAGNOSIS — R091 Pleurisy: Secondary | ICD-10-CM | POA: Diagnosis not present

## 2017-10-29 DIAGNOSIS — M62838 Other muscle spasm: Secondary | ICD-10-CM | POA: Insufficient documentation

## 2017-10-29 DIAGNOSIS — Z955 Presence of coronary angioplasty implant and graft: Secondary | ICD-10-CM | POA: Insufficient documentation

## 2017-10-29 DIAGNOSIS — R0789 Other chest pain: Secondary | ICD-10-CM

## 2017-10-29 DIAGNOSIS — I700 Atherosclerosis of aorta: Secondary | ICD-10-CM | POA: Diagnosis not present

## 2017-10-29 DIAGNOSIS — R079 Chest pain, unspecified: Secondary | ICD-10-CM | POA: Diagnosis not present

## 2017-10-29 LAB — BASIC METABOLIC PANEL
Anion gap: 10 (ref 5–15)
BUN: 8 mg/dL (ref 6–20)
CO2: 26 mmol/L (ref 22–32)
Calcium: 9.3 mg/dL (ref 8.9–10.3)
Chloride: 97 mmol/L — ABNORMAL LOW (ref 101–111)
Creatinine, Ser: 0.79 mg/dL (ref 0.44–1.00)
Glucose, Bld: 102 mg/dL — ABNORMAL HIGH (ref 65–99)
POTASSIUM: 4.3 mmol/L (ref 3.5–5.1)
Sodium: 133 mmol/L — ABNORMAL LOW (ref 135–145)

## 2017-10-29 LAB — CBC
HEMATOCRIT: 41.4 % (ref 36.0–46.0)
HEMOGLOBIN: 14.3 g/dL (ref 12.0–15.0)
MCH: 31.6 pg (ref 26.0–34.0)
MCHC: 34.5 g/dL (ref 30.0–36.0)
MCV: 91.6 fL (ref 78.0–100.0)
Platelets: 261 10*3/uL (ref 150–400)
RBC: 4.52 MIL/uL (ref 3.87–5.11)
RDW: 13 % (ref 11.5–15.5)
WBC: 6.6 10*3/uL (ref 4.0–10.5)

## 2017-10-29 LAB — I-STAT TROPONIN, ED: Troponin i, poc: 0.02 ng/mL (ref 0.00–0.08)

## 2017-10-29 MED ORDER — ACETAMINOPHEN 500 MG PO TABS
1000.0000 mg | ORAL_TABLET | Freq: Once | ORAL | Status: AC
  Administered 2017-10-29: 1000 mg via ORAL
  Filled 2017-10-29: qty 2

## 2017-10-29 MED ORDER — ORPHENADRINE CITRATE ER 100 MG PO TB12
100.0000 mg | ORAL_TABLET | Freq: Once | ORAL | Status: AC
  Administered 2017-10-29: 100 mg via ORAL
  Filled 2017-10-29: qty 1

## 2017-10-29 MED ORDER — ACETAMINOPHEN 500 MG PO TABS: 1000.0000 mg | ORAL_TABLET | Freq: Four times a day (QID) | ORAL | 0 refills | Status: DC | PRN

## 2017-10-29 MED ORDER — ORPHENADRINE CITRATE ER 100 MG PO TB12: 100.0000 mg | ORAL_TABLET | Freq: Two times a day (BID) | ORAL | 0 refills | Status: DC

## 2017-10-29 MED ORDER — IOPAMIDOL (ISOVUE-370) INJECTION 76%
INTRAVENOUS | Status: AC
  Administered 2017-10-29: 100 mL via INTRAVENOUS
  Filled 2017-10-29: qty 100

## 2017-10-29 NOTE — Discharge Instructions (Signed)
1.  Take extra strength Tylenol (1000 mg) every 6 hours for pain. 2.  Take Norflex twice daily for muscle spasms. 3.  You may use warm moist heat on your back for additional comfort. 4.  See your family doctor within the next 3-5 days for recheck. 5.  Return to the emergency department if symptoms are worsening changing or new symptoms develop.

## 2017-10-29 NOTE — ED Triage Notes (Signed)
Pt states spasms around rib cage since Thursday, and states Thursday had an episode during the pain when she blacked out. Pt denies any recent injuries.  Pt lives alone. Denies hx of memory impairment. Alert and oriented currently.

## 2017-10-29 NOTE — ED Notes (Signed)
Patient transported to CT 

## 2017-10-29 NOTE — ED Provider Notes (Signed)
[facility] Provider Note   CSN: [id number] Arrival date & time: 10/29/17  1452     History   Chief Complaint Chief Complaint  Patient presents with  . Chest Pain    HPI Sabrina Hart is a 83 y.o. female.  HPI Patient has been having pain in her right back around her scapula for several days.  She reports it comes in twinges it feels like an intense cramp or spasm.  Patient fell and struck the back of her neck and is been having some ongoing pain in her neck and back, this was several weeks ago.  He has been wearing a soft neck collar since that time.  She denies radiation of pain into her arms or weakness though.  She does report a couple of days ago, she cannot remember it was yesterday or the day before, she was running a bath and thinks she might of temporarily passed out.  She remembers recalling that she was running the bath and then decided she was going to do something in her kitchen before coming back but then does not remember going into the kitchen.  He has not been having any ongoing anterior chest pain dyspnea nausea lightheadedness or radiation of pain. Past Medical History:  Diagnosis Date  . Anginal pain (HCC)    1990's; 09/28/2012  . Arthritis    "very mild" (09/28/2012)  . Coronary artery disease 09/27/2012   s/p PCI of the RCA with DES with remote PCI in [place] 15 to 20 years ago. 2. cath 02/2013 patent mid RCA stent, 30% left main, 70% mid to distaal tanden 70% lesions x 3, 60% mid diag, 50% ostial LCx, 40% mid LCx.   . Glaucoma   . Hyperlipidemia   . Hypothyroidism     Patient Active Problem List   Diagnosis Date Noted  . Lower extremity edema 04/26/2017  . Unstable angina (HCC) 03/02/2013  . Hypothyroidism 09/29/2012  . Hyperlipidemia 09/29/2012  . Hypokalemia 09/29/2012  . CAD (coronary artery disease) 09/27/2012    Past Surgical History:  Procedure Laterality Date  . CARDIAC CATHETERIZATION  1990s     PCI  . CATARACT EXTRACTION W/ INTRAOCULAR LENS  IMPLANT, BILATERAL  1970's  . CORONARY ANGIOPLASTY WITH STENT PLACEMENT  09/28/2012   20-30% diffuse LAD stenosis, mild-mod D1 dz, 70% distal D1 lesion, mild diffuse LCx dz, 50-60% OM1 stenosis, 60% mid & 90% mid-dis RCA stenosis s/p DES; LVEF 75-80% w/ hyperdynamic fxn.   . DENTAL SURGERY     "implants"  . INNER EAR SURGERY  ~ 2010   "put plugs in so I could fly" (09/28/2012)  . LEFT HEART CATHETERIZATION WITH CORONARY ANGIOGRAM N/A 09/28/2012   Procedure: LEFT HEART CATHETERIZATION WITH CORONARY ANGIOGRAM;  Surgeon: Vesta MixerPhilip J Nahser, MD;  [facility];  Service: Cardiovascular;  Laterality: N/A;  . LUMBAR DISC SURGERY  1980's   "took out a little piece" (09/28/2012)  . PERCUTANEOUS CORONARY STENT INTERVENTION (PCI-S)  09/28/2012   Procedure: PERCUTANEOUS CORONARY STENT INTERVENTION (PCI-S);  Surgeon: Vesta MixerPhilip J Nahser, MD;  [facility];  Service: Cardiovascular;;  . REDUCTION MAMMAPLASTY  1970's    OB History    No data available       Home Medications    Prior to Admission medications   Medication Sig Start Date End Date Taking? Authorizing Provider  AMBULATORY NON FORMULARY MEDICATION Take 300 mg by mouth as directed. Medication Name: Inclirisan sodium 300mg  vs placebo (orion-10  Study per protocol 12/30/16  Yes Herby Abraham, MD  atorvastatin (LIPITOR) 40 MG tablet TAKE 1 TABLET BY MOUTH EVERY DAY Patient taking differently: Take 40 mg by mouth once a day 12/28/16  Yes Nahser, Deloris Ping, MD  COMBIGAN 0.2-0.5 % ophthalmic solution PLACE 1 DROP IN AFFECTED EYE(S) TWICE DAILY 07/28/17  Yes [provider]  dorzolamide-timolol (COSOPT) 22.3-6.8 MG/ML ophthalmic solution Place 1 drop into both eyes daily. 11/11/16  Yes [provider]  levothyroxine (SYNTHROID, LEVOTHROID) 125 MCG tablet Take 1 tablet by mouth every morning on an empty stomach with a full glass of water. 08/03/17  Yes Doreene Nest,  NP  LUMIGAN 0.01 % SOLN PLACE 1 DROP IN AFFECTED EYE(S) EVERY EVENING 07/29/17  Yes [provider]  Multiple Vitamins-Minerals (ONE-A-DAY WOMENS 50 PLUS PO) Take 1 tablet by mouth daily.   Yes [provider]  nitroGLYCERIN (NITROSTAT) 0.4 MG SL tablet PLACE 1 TABLET UNDER THE TONGUE EVERY 5 MINUTES AS NEEDED FOR CHEST PAIN. 08/04/16  Yes Nahser, Deloris Ping, MD  acetaminophen (TYLENOL) 500 MG tablet Take 2 tablets (1,000 mg total) by mouth every 6 (six) hours as needed. 10/29/17   Arby Barrette, MD  aspirin EC 81 MG tablet Take 1 tablet (81 mg total) by mouth daily. Patient not taking: Reported on 10/29/2017 05/17/17   Manson Passey, PA  orphenadrine (NORFLEX) 100 MG tablet Take 1 tablet (100 mg total) by mouth 2 (two) times daily. 10/29/17   Arby Barrette, MD    Family History No family history on file.  Social History Social History   Tobacco Use  . Smoking status: Never Smoker  . Smokeless tobacco: Never Used  Substance Use Topics  . Alcohol use: Yes    Alcohol/week: 0.6 oz    Types: 1 Glasses of wine per week    Comment: 09/28/2012 "1 glass of wine averages once/wk; beer average once/week during summer w/friends"  . Drug use: No     Allergies   Other; Pollen extract; Latex; Morphine and related; Nickel; and Penicillins   Review of Systems Review of Systems 10 Systems reviewed and are negative for acute change except as noted in the HPI.   Physical Exam Updated Vital Signs BP (!) 192/85   Pulse 73   Temp 98.3 F (36.8 C) (Oral)   Resp 14   Ht 5\' 2"  (1.575 m)   Wt 58.1 kg (128 lb)   SpO2 98%   BMI 23.41 kg/m   Physical Exam  Constitutional: She is oriented to person, place, and time. She appears well-developed and well-nourished. No distress.  HENT:  Head: Normocephalic and atraumatic.  Eyes: Conjunctivae are normal.  Neck: Neck supple.  C-spine nontender.  Wearing soft collar.  Cardiovascular: Normal rate and regular rhythm.  No murmur  heard. Pulmonary/Chest: Effort normal and breath sounds normal. No respiratory distress.  Abdominal: Soft. There is no tenderness.  Musculoskeletal: Normal range of motion. She exhibits no edema.  Patient gets periodic spasms of pain in her right posterior thoracic back with certain position changes or deep breath.  No rash or palpable anomaly.  No contusions or abrasions.  Neurological: She is alert and oriented to person, place, and time. No cranial nerve deficit. She exhibits normal muscle tone. Coordination normal.  Skin: Skin is warm and dry.  Psychiatric: She has a normal mood and affect.  Nursing note and vitals reviewed.    ED Treatments / Results  Labs (all labs ordered are listed, but only abnormal  results are displayed) Labs Reviewed  BASIC METABOLIC PANEL - Abnormal; Notable for the following components:      Result Value   Sodium 133 (*)    Chloride 97 (*)    Glucose, Bld 102 (*)    All other components within normal limits  CBC  I-STAT TROPONIN, ED    EKG  EKG Interpretation  Date/Time:  Saturday October 29 2017 15:02:26 EST Ventricular Rate:  92 PR Interval:  144 QRS Duration: 72 QT Interval:  336 QTC Calculation: 415 R Axis:   -22 Text Interpretation:  Normal sinus rhythm Possible Inferior infarct , age undetermined Anterior infarct , age undetermined Abnormal ECG no STEMI no sig change as compared to previous EKGs. Confirmed by Arby Barrette [phone]) on 10/30/2017 5:56:56 PM       Radiology Dg Chest 2 View  Result Date: 10/29/2017 CLINICAL DATA:  Right-sided chest pain.  Right-sided back spasms. EXAM: CHEST  2 VIEW COMPARISON:  September 27, 2012 FINDINGS: There is a torturous thoracic aorta which is similar since the spinal films from January 02, 2014. The heart, hila, and mediastinum are normal. No pneumothorax. No pulmonary nodules or masses. No focal infiltrates. Mild bibasilar atelectasis. No other acute abnormalities. IMPRESSION: No active  cardiopulmonary disease. Electronically Signed   By: Gerome Sam III M.D   On: 10/29/2017 16:07   Ct Angio Chest Pe W/cm &/or Wo Cm  Result Date: 10/29/2017 CLINICAL DATA:  Pulmonary embolism suspected. Spasms around the right rib cage since Thursday. EXAM: CT ANGIOGRAPHY CHEST WITH CONTRAST TECHNIQUE: Multidetector CT imaging of the chest was performed using the standard protocol during bolus administration of intravenous contrast. Multiplanar CT image reconstructions and MIPs were obtained to evaluate the vascular anatomy. CONTRAST:  ISOVUE-370 IOPAMIDOL (ISOVUE-370) INJECTION 76% COMPARISON:  Chest x-ray from earlier today FINDINGS: Cardiovascular: Satisfactory opacification of the pulmonary arteries to the segmental level. No evidence of pulmonary embolism. Normal heart size. No pericardial effusion. Diffuse atherosclerosis of the aorta. Apple of wall thickening along the posterior aspect of the descending segment is low-density and best attributed to atherosclerosis. No visible dissection flap. As permitted by contrast enhancement no high-density intramural hematoma. Mediastinum/Nodes: Negative for adenopathy or mass. Lungs/Pleura: Mild atelectasis. There is no edema, consolidation, effusion, or pneumothorax. Clear central airways. Upper Abdomen: No acute finding.  Atherosclerosis. Musculoskeletal: No acute or aggressive finding. Diffuse thoracic spondylosis and disc degeneration. Review of the MIP images confirms the above findings. IMPRESSION: 1. Negative for pulmonary embolism or other acute finding. 2. Aortic Atherosclerosis (ICD10-I70.0). Electronically Signed   By: Marnee Spring M.D.   On: 10/29/2017 19:03    Procedures Procedures (including critical care time)  Medications Ordered in ED Medications  iopamidol (ISOVUE-370) 76 % injection (100 mLs Intravenous Contrast Given 10/29/17 1824)  acetaminophen (TYLENOL) tablet 1,000 mg (1,000 mg Oral Given 10/29/17 1945)  orphenadrine  (NORFLEX) 12 hr tablet 100 mg (100 mg Oral Given 10/29/17 2023)     Initial Impression / Assessment and Plan / ED Course  I have reviewed the triage vital signs and the nursing notes.  Pertinent labs & imaging results that were available during my care of the patient were reviewed by me and considered in my medical decision making (see chart for details).     Final Clinical Impressions(s) / ED Diagnoses   Final diagnoses:  Other chest pain  Pleurisy  Muscle spasm  CT has ruled out pulmonary embolus or other structural thoracic etiology for the patient's pain.  Patient is  experiencing pain that has both a pleurisy and a musculoskeletal quality to it.  She reports it is coming and sharp cramping type waves that just last for a few minutes or seconds.  Chest wall does not show any rash to suggest shingles.  Patient has more remote possible mechanical injury which may still be a contributing factor.  Clinically the patient is well in appearance alert and in no distress.  Plan will be to treat for pain with Tylenol and Norflex.  Patient is counseled on expeditious follow-up with her primary care doctor and return precautions reviewed.  ED Discharge Orders        Ordered    acetaminophen (TYLENOL) 500 MG tablet  Every 6 hours PRN     10/29/17 1953    orphenadrine (NORFLEX) 100 MG tablet  2 times daily     10/29/17 1953       Arby BarrettePfeiffer, Kayce Betty, MD 10/30/17 1757

## 2017-11-01 ENCOUNTER — Telehealth: Payer: Self-pay | Admitting: Emergency Medicine

## 2017-11-01 DIAGNOSIS — I2510 Atherosclerotic heart disease of native coronary artery without angina pectoris: Secondary | ICD-10-CM

## 2017-11-01 DIAGNOSIS — E785 Hyperlipidemia, unspecified: Secondary | ICD-10-CM

## 2017-11-01 DIAGNOSIS — E039 Hypothyroidism, unspecified: Secondary | ICD-10-CM

## 2017-11-01 MED ORDER — ATORVASTATIN CALCIUM 40 MG PO TABS: 40.0000 mg | ORAL_TABLET | Freq: Every day | ORAL | 1 refills | Status: DC

## 2017-11-01 NOTE — Telephone Encounter (Signed)
Left a VM for patient to give the office a call back regarding scheduling a follow up appointment with PCP.

## 2017-11-03 ENCOUNTER — Encounter: Payer: Self-pay | Admitting: Family Medicine

## 2017-11-03 ENCOUNTER — Ambulatory Visit (INDEPENDENT_AMBULATORY_CARE_PROVIDER_SITE_OTHER): Payer: Medicare Other | Admitting: Family Medicine

## 2017-11-03 VITALS — BP 130/80 | HR 72 | Temp 97.7°F | Wt 133.6 lb

## 2017-11-03 DIAGNOSIS — E039 Hypothyroidism, unspecified: Secondary | ICD-10-CM

## 2017-11-03 DIAGNOSIS — H539 Unspecified visual disturbance: Secondary | ICD-10-CM

## 2017-11-03 DIAGNOSIS — M542 Cervicalgia: Secondary | ICD-10-CM | POA: Diagnosis not present

## 2017-11-03 NOTE — Progress Notes (Signed)
Subjective:    Patient ID: Sabrina Hart, female    DOB: 03/30/1932, 83 y.o.   MRN: [id number]  Chief Complaint  Patient presents with  . Follow-up    HPI Patient was seen today for f/u.  Since last OFV pt was seen in office for neck pain after striking her head against the table when reaching underneath it.  Since then she has been wearing a soft neck collar.  She endorses improvement in the L side of her neck, but still has occasional pain in the R side.  Pt has not been taking anything for the discomfort.  Pt was then seen in the ED on 12/1 for other chest pain.  Despite all this pt states she is doing well.  She has an upcoming appointment with her ophthalmologist as she has noted changes in her vision.  Pt states she was able to read print without her glasses but now feels like it is becoming more difficult.  Pt denies headaches, eye pain, chest pain, seeing floaters in her vision, palpitations, diarrhea, constipation.  Pt is unsure if she needs medication refills.  Past Medical History:  Diagnosis Date  . Anginal pain (HCC)    1990's; 09/28/2012  . Arthritis    "very mild" (09/28/2012)  . Coronary artery disease 09/27/2012   s/p PCI of the RCA with DES with remote PCI in [place] 15 to 20 years ago. 2. cath 02/2013 patent mid RCA stent, 30% left main, 70% mid to distaal tanden 70% lesions x 3, 60% mid diag, 50% ostial LCx, 40% mid LCx.   . Glaucoma   . Hyperlipidemia   . Hypothyroidism     Allergies  Allergen Reactions  . Other Other (See Comments)    Metals; "skin gets weepy; gets worse if it makes contact" (09/28/2012)  . Pollen Extract Other (See Comments)    "sneezing; watery eyes"  . Latex Itching and Swelling    Skin swells  . Morphine And Related Rash  . Nickel Other (See Comments)    Causes weepiness (skin)  . Penicillins Rash    Has patient had a PCN reaction causing immediate rash, facial/tongue/throat swelling, SOB or lightheadedness with hypotension:  Yes Has patient had a PCN reaction causing severe rash involving mucus membranes or skin necrosis: Unk Has patient had a PCN reaction that required hospitalization: No  Has patient had a PCN reaction occurring within the last 10 years: No If all of the above answers are "NO", then may proceed with Cephalosporin use.     ROS General: Denies fever, chills, night sweats, changes in weight, changes in appetite HEENT: Denies headaches, ear pain, rhinorrhea, sore throat   +changes in vision CV: Denies CP, palpitations, SOB, orthopnea Pulm: Denies SOB, cough, wheezing GI: Denies abdominal pain, nausea, vomiting, diarrhea, constipation GU: Denies dysuria, hematuria, frequency, vaginal discharge Msk: Denies muscle cramps, joint pains  +neck pain Neuro: Denies weakness, numbness, tingling Skin: Denies rashes, bruising Psych: Denies depression, anxiety, hallucinations     Objective:    Blood pressure 130/80, pulse 72, temperature 97.7 F (36.5 C), temperature source Oral, weight 133 lb 9.6 oz (60.6 kg).   Gen. Pleasant, well-nourished, in no distress, normal affect   HEENT: [place]/AT, face symmetric, proptosis, no scleral icterus, pt not wearing glasses, PERRLA, EOMI, Fundoscopic exam limited, but without hemorrhages or cotton wool spots. Eye exam both 20/50,  R 20/80,  L20/80   Nares patent without drainage, pharynx without erythema or exudate. Lungs: no accessory muscle use,  CTAB, no wheezes or rales Cardiovascular: RRR, no m/r/g, no peripheral edema Musculoskeletal: Wearing soft cervical collar.  No deformities, no cyanosis or clubbing, normal tone Neuro:  A&Ox3, CN II-XII intact, normal gait Skin:  Warm, no lesions/ rash   Wt Readings from Last 3 Encounters:  11/03/17 133 lb 9.6 oz (60.6 kg)  10/29/17 128 lb (58.1 kg)  10/03/17 137 lb 3.2 oz (62.2 kg)    Lab Results  Component Value Date   WBC 6.6 10/29/2017   HGB 14.3 10/29/2017   HCT 41.4 10/29/2017   PLT 261 10/29/2017    GLUCOSE 102 (H) 10/29/2017   CHOL 192 11/12/2016   TRIG 46 11/12/2016   HDL 101 11/12/2016   LDLDIRECT 127.7 01/14/2014   LDLCALC 82 11/12/2016   ALT 13 11/12/2016   AST 21 11/12/2016   NA 133 (L) 10/29/2017   K 4.3 10/29/2017   CL 97 (L) 10/29/2017   CREATININE 0.79 10/29/2017   BUN 8 10/29/2017   CO2 26 10/29/2017   TSH 2.20 11/12/2016   INR 1.0 03/02/2013   HGBA1C 5.6 09/28/2012    Assessment/Plan:  Hypothyroidism, unspecified type -asymptomatic -continue synthroid 125 mcg -recheck TSH next month  Neck pain -discussed neck exercises -pt advised she did not have to wear the soft collar, but she wishes to continue -given RTC precautions if neck pain becomes worse  Vision changes -Eye exam both 20/50,  R 20/80,  L20/80 -Advised to keep appt with Ophtho  F/u in ~3 months, sooner prn

## 2017-11-03 NOTE — Patient Instructions (Addendum)
You can get some Biofreeze cream for her neck.  It is available at [employer], [employer], or any drug store.  It typically comes in a tube, rollerball, spray, or wipe form.  It is recommended that you make an appointment to see you eye doctor as soon as possible given your new changes in vision. Muscle Pain, Adult Muscle pain (myalgia) may be mild or severe. In most cases, the pain lasts only a short time and it goes away without treatment. It is normal to feel some muscle pain after starting a workout program. Muscles that have not been used often will be sore at first. Muscle pain may also be caused by many other things, including:  Overuse or muscle strain, especially if you are not in shape. This is the most common cause of muscle pain.  Injury.  Bruises.  Viruses, such as the flu.  Infectious diseases.  A chronic condition that causes muscle tenderness, fatigue, and headache (fibromyalgia).  A condition, such as lupus, in which the body's disease-fighting system attacks other organs in the body (autoimmune or rheumatologic diseases).  Certain drugs, including ACE inhibitors and statins.  To diagnose the cause of your muscle pain, your health care provider will do a physical exam and ask questions about the pain and when it began. If you have not had muscle pain for very long, your health care provider may want to wait before doing much testing. If your muscle pain has lasted a long time, your health care provider may want to run tests right away. In some cases, this may include tests to rule out certain conditions or illnesses. Treatment for muscle pain depends on the cause. Home care is often enough to relieve muscle pain. Your health care provider may also prescribe anti-inflammatory medicine. Follow these instructions at home: Activity  If overuse is causing your muscle pain: ? Slow down your activities until the pain goes away. ? Do regular, gentle exercises if you are not usually  active. ? Warm up before exercising. Stretch before and after exercising. This can help lower the risk of muscle pain.  Do not continue working out if the pain is very bad. Bad pain could mean that you have injured a muscle. Managing pain and discomfort   If directed, apply ice to the sore muscle: ? Put ice in a plastic bag. ? Place a towel between your skin and the bag. ? Leave the ice on for 20 minutes, 2-3 times a day.  You may also alternate between applying ice and applying heat as told by your health care provider. To apply heat, use the heat source that your health care provider recommends, such as a moist heat pack or a heating pad. ? Place a towel between your skin and the heat source. ? Leave the heat on for 20-30 minutes. ? Remove the heat if your skin turns bright red. This is especially important if you are unable to feel pain, heat, or cold. You may have a greater risk of getting burned. Medicines  Take over-the-counter and prescription medicines only as told by your health care provider.  Do not drive or use heavy machinery while taking prescription pain medicine. Contact a health care provider if:  Your muscle pain gets worse and medicines do not help.  You have muscle pain that lasts longer than 3 days.  You have a rash or fever along with muscle pain.  You have muscle pain after a tick bite.  You have muscle pain while  working out, even though you are in good physical condition.  You have redness, soreness, or swelling along with muscle pain.  You have muscle pain after starting a new medicine or changing the dose of a medicine. Get help right away if:  You have trouble breathing.  You have trouble swallowing.  You have muscle pain along with a stiff neck, fever, and vomiting.  You have severe muscle weakness or cannot move part of your body. This information is not intended to replace advice given to you by your health care provider. Make sure you  discuss any questions you have with your health care provider. Document Released: 10/07/2006 Document Revised: 06/04/2016 Document Reviewed: 04/06/2016 Elsevier Interactive Patient Education  2018 [employer]

## 2017-11-15 ENCOUNTER — Encounter: Payer: Self-pay | Admitting: *Deleted

## 2017-11-15 DIAGNOSIS — Z006 Encounter for examination for normal comparison and control in clinical research program: Secondary | ICD-10-CM

## 2017-11-15 NOTE — Progress Notes (Signed)
Subject to Research clinic for [id number] in the [facility].  No c/o today.  She did state that she had been to the ED related to a fall and to the PCP for f/u.  The ED stated the reason for the visit was chest pain.  After testing and labs performed this was non cardiac chest pain and she was given muscle relaxants and acetaminophen. I will record this event as an AE.  Next appointment scheduled for March 27, 2017 @0800 .

## 2017-11-16 ENCOUNTER — Encounter: Payer: Self-pay | Admitting: Cardiovascular Disease

## 2017-11-19 ENCOUNTER — Other Ambulatory Visit: Payer: Self-pay | Admitting: Primary Care

## 2017-11-19 DIAGNOSIS — I2510 Atherosclerotic heart disease of native coronary artery without angina pectoris: Secondary | ICD-10-CM

## 2017-12-20 ENCOUNTER — Other Ambulatory Visit: Payer: Self-pay | Admitting: Primary Care

## 2017-12-20 DIAGNOSIS — I2510 Atherosclerotic heart disease of native coronary artery without angina pectoris: Secondary | ICD-10-CM

## 2017-12-29 DIAGNOSIS — H401131 Primary open-angle glaucoma, bilateral, mild stage: Secondary | ICD-10-CM | POA: Diagnosis not present

## 2017-12-30 ENCOUNTER — Other Ambulatory Visit: Payer: Self-pay | Admitting: Cardiovascular Disease

## 2018-01-01 ENCOUNTER — Other Ambulatory Visit: Payer: Self-pay | Admitting: Family Medicine

## 2018-01-01 DIAGNOSIS — E039 Hypothyroidism, unspecified: Secondary | ICD-10-CM

## 2018-01-01 DIAGNOSIS — E785 Hyperlipidemia, unspecified: Secondary | ICD-10-CM

## 2018-01-01 DIAGNOSIS — I2510 Atherosclerotic heart disease of native coronary artery without angina pectoris: Secondary | ICD-10-CM

## 2018-01-03 ENCOUNTER — Telehealth: Payer: Self-pay | Admitting: Emergency Medicine

## 2018-01-03 ENCOUNTER — Other Ambulatory Visit: Payer: Self-pay | Admitting: Primary Care

## 2018-01-03 DIAGNOSIS — I2510 Atherosclerotic heart disease of native coronary artery without angina pectoris: Secondary | ICD-10-CM

## 2018-01-03 NOTE — Telephone Encounter (Signed)
Left a VM for patient to give the office a call back to schedule a lab appointment to recheck TSH level.

## 2018-01-09 ENCOUNTER — Other Ambulatory Visit: Payer: Self-pay | Admitting: Cardiovascular Disease

## 2018-01-09 MED ORDER — NITROGLYCERIN 0.4 MG SL SUBL: SUBLINGUAL_TABLET | SUBLINGUAL | 0 refills | Status: DC

## 2018-01-09 NOTE — Telephone Encounter (Signed)
Pharmacy requested a 90 day supply. Resent medication to pt's pharmacy as requested. Confirmation received.

## 2018-01-16 ENCOUNTER — Encounter: Payer: Self-pay | Admitting: Family Medicine

## 2018-01-16 ENCOUNTER — Ambulatory Visit (INDEPENDENT_AMBULATORY_CARE_PROVIDER_SITE_OTHER): Payer: Medicare Other | Admitting: Family Medicine

## 2018-01-16 VITALS — BP 110/60 | HR 58 | Temp 97.6°F | Ht 62.0 in | Wt 134.0 lb

## 2018-01-16 DIAGNOSIS — R51 Headache: Secondary | ICD-10-CM

## 2018-01-16 DIAGNOSIS — R682 Dry mouth, unspecified: Secondary | ICD-10-CM | POA: Diagnosis not present

## 2018-01-16 DIAGNOSIS — R42 Dizziness and giddiness: Secondary | ICD-10-CM | POA: Diagnosis not present

## 2018-01-16 DIAGNOSIS — G4452 New daily persistent headache (NDPH): Secondary | ICD-10-CM | POA: Diagnosis not present

## 2018-01-16 DIAGNOSIS — R519 Headache, unspecified: Secondary | ICD-10-CM

## 2018-01-16 LAB — BASIC METABOLIC PANEL
BUN: 12 mg/dL (ref 6–23)
CHLORIDE: 103 meq/L (ref 96–112)
CO2: 32 mEq/L (ref 19–32)
Calcium: 9 mg/dL (ref 8.4–10.5)
Creatinine, Ser: 0.66 mg/dL (ref 0.40–1.20)
GFR: 109.83 mL/min (ref >60.00)
GLUCOSE: 97 mg/dL (ref 70–99)
POTASSIUM: 4.3 meq/L (ref 3.5–5.1)
SODIUM: 139 meq/L (ref 135–145)

## 2018-01-16 LAB — SEDIMENTATION RATE: SED RATE: 4 mm/h (ref 0–30)

## 2018-01-16 LAB — C-REACTIVE PROTEIN: CRP: 0.1 mg/dL — ABNORMAL LOW (ref 0.5–20.0)

## 2018-01-16 LAB — HEMOGLOBIN A1C: HEMOGLOBIN A1C: 5.6 % (ref 4.6–6.5)

## 2018-01-16 NOTE — Patient Instructions (Addendum)
Dizziness Dizziness is a common problem. It is a feeling of unsteadiness or light-headedness. You may feel like you are about to faint. Dizziness can lead to injury if you stumble or fall. Anyone can become dizzy, but dizziness is more common in older adults. This condition can be caused by a number of things, including medicines, dehydration, or illness. Follow these instructions at home: Eating and drinking  Drink enough fluid to keep your urine clear or pale yellow. This helps to keep you from becoming dehydrated. Try to drink more clear fluids, such as water.  Do not drink alcohol.  Limit your caffeine intake if told to do so by your health care provider. Check ingredients and nutrition facts to see if a food or beverage contains caffeine.  Limit your salt (sodium) intake if told to do so by your health care provider. Check ingredients and nutrition facts to see if a food or beverage contains sodium. Activity  Avoid making quick movements. ? Rise slowly from chairs and steady yourself until you feel okay. ? In the morning, first sit up on the side of the bed. When you feel okay, stand slowly while you hold onto something until you know that your balance is fine.  If you need to stand in one place for a long time, move your legs often. Tighten and relax the muscles in your legs while you are standing.  Do not drive or use heavy machinery if you feel dizzy.  Avoid bending down if you feel dizzy. Place items in your home so that they are easy for you to reach without leaning over. Lifestyle  Do not use any products that contain nicotine or tobacco, such as cigarettes and e-cigarettes. If you need help quitting, ask your health care provider.  Try to reduce your stress level by using methods such as yoga or meditation. Talk with your health care provider if you need help to manage your stress. General instructions  Watch your dizziness for any changes.  Take over-the-counter and  prescription medicines only as told by your health care provider. Talk with your health care provider if you think that your dizziness is caused by a medicine that you are taking.  Tell a friend or a family member that you are feeling dizzy. If he or she notices any changes in your behavior, have this person call your health care provider.  Keep all follow-up visits as told by your health care provider. This is important. Contact a health care provider if:  Your dizziness does not go away.  Your dizziness or light-headedness gets worse.  You feel nauseous.  You have reduced hearing.  You have new symptoms.  You are unsteady on your feet or you feel like the room is spinning. Get help right away if:  You vomit or have diarrhea and are unable to eat or drink anything.  You have problems talking, walking, swallowing, or using your arms, hands, or legs.  You feel generally weak.  You are not thinking clearly or you have trouble forming sentences. It may take a friend or family member to notice this.  You have chest pain, abdominal pain, shortness of breath, or sweating.  Your vision changes.  You have any bleeding.  You have a severe headache.  You have neck pain or a stiff neck.  You have a fever. These symptoms may represent a serious problem that is an emergency. Do not wait to see if the symptoms will go away. Get medical help   right away. Call your local emergency services (911 in the [place] Do not drive yourself to the hospital. Summary  Dizziness is a feeling of unsteadiness or light-headedness. This condition can be caused by a number of things, including medicines, dehydration, or illness.  Anyone can become dizzy, but dizziness is more common in older adults.  Drink enough fluid to keep your urine clear or pale yellow. Do not drink alcohol.  Avoid making quick movements if you feel dizzy. Monitor your dizziness for any changes. This information is not intended to  replace advice given to you by your health care provider. Make sure you discuss any questions you have with your health care provider. Document Released: 05/11/2001 Document Revised: 12/18/2016 Document Reviewed: 12/18/2016 Elsevier Interactive Patient Education  2018 [employer] Inc.  General Headache Without Cause A headache is pain or discomfort felt around the head or neck area. The specific cause of a headache may not be found. There are many causes and types of headaches. A few common ones are:  Tension headaches.  Migraine headaches.  Cluster headaches.  Chronic daily headaches.  Follow these instructions at home: Watch your condition for any changes. Take these steps to help with your condition: Managing pain  Take over-the-counter and prescription medicines only as told by your health care provider.  Lie down in a dark, quiet room when you have a headache.  If directed, apply ice to the head and neck area: ? Put ice in a plastic bag. ? Place a towel between your skin and the bag. ? Leave the ice on for 20 minutes, 2-3 times per day.  Use a heating pad or hot shower to apply heat to the head and neck area as told by your health care provider.  Keep lights dim if bright lights bother you or make your headaches worse. Eating and drinking  Eat meals on a regular schedule.  Limit alcohol use.  Decrease the amount of caffeine you drink, or stop drinking caffeine. General instructions  Keep all follow-up visits as told by your health care provider. This is important.  Keep a headache journal to help find out what may trigger your headaches. For example, write down: ? What you eat and drink. ? How much sleep you get. ? Any change to your diet or medicines.  Try massage or other relaxation techniques.  Limit stress.  Sit up straight, and do not tense your muscles.  Do not use tobacco products, including cigarettes, chewing tobacco, or e-cigarettes. If you need help  quitting, ask your health care provider.  Exercise regularly as told by your health care provider.  Sleep on a regular schedule. Get 7-9 hours of sleep, or the amount recommended by your health care provider. Contact a health care provider if:  Your symptoms are not helped by medicine.  You have a headache that is different from the usual headache.  You have nausea or you vomit.  You have a fever. Get help right away if:  Your headache becomes severe.  You have repeated vomiting.  You have a stiff neck.  You have a loss of vision.  You have problems with speech.  You have pain in the eye or ear.  You have muscular weakness or loss of muscle control.  You lose your balance or have trouble walking.  You feel faint or pass out.  You have confusion. This information is not intended to replace advice given to you by your health care provider. Make sure you discuss  any questions you have with your health care provider. Document Released: 11/15/2005 Document Revised: 04/22/2016 Document Reviewed: 03/10/2015 Elsevier Interactive Patient Education  [employer]2018 [employer]
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
## 2018-01-17 ENCOUNTER — Ambulatory Visit (INDEPENDENT_AMBULATORY_CARE_PROVIDER_SITE_OTHER)
Admission: RE | Admit: 2018-01-17 | Discharge: 2018-01-17 | Disposition: A | Discharge status: Home / Self Care | Payer: Medicare Other | Source: Ambulatory Visit | Attending: Family Medicine | Admitting: Family Medicine

## 2018-01-17 DIAGNOSIS — G4452 New daily persistent headache (NDPH): Secondary | ICD-10-CM

## 2018-01-17 DIAGNOSIS — R42 Dizziness and giddiness: Secondary | ICD-10-CM

## 2018-01-17 MED ORDER — IOPAMIDOL (ISOVUE-300) INJECTION 61%
80.0000 mL | Freq: Once | INTRAVENOUS | Status: AC | PRN
  Administered 2018-01-17: 80 mL via INTRAVENOUS

## 2018-01-31 ENCOUNTER — Ambulatory Visit: Payer: Self-pay | Admitting: *Deleted

## 2018-01-31 NOTE — Telephone Encounter (Signed)
  Reason for Disposition . [1] MODERATE dizziness (e.g., interferes with normal activities) AND [2] has been evaluated by physician for this  Answer Assessment - Initial Assessment Questions Pt seen in office on 01/16/18 for same symptoms of dizziness. Pt states she has passing moments of dizziness and they usually occur with movement. Pt states she forgot to call to schedule a follow up appt due to current symptoms. Pt scheduled for OV with Dr. Salomon FickBanks on 02/01/18  Protocols used: Donnie CoffinIZZINESS - LIGHTHEADEDNESS-A-AH

## 2018-02-01 ENCOUNTER — Encounter: Payer: Self-pay | Admitting: Family Medicine

## 2018-02-01 ENCOUNTER — Ambulatory Visit (INDEPENDENT_AMBULATORY_CARE_PROVIDER_SITE_OTHER): Payer: Medicare Other | Admitting: Family Medicine

## 2018-02-01 ENCOUNTER — Other Ambulatory Visit: Payer: Self-pay | Admitting: Family Medicine

## 2018-02-01 VITALS — BP 168/80 | Temp 97.9°F | Wt 131.0 lb

## 2018-02-01 DIAGNOSIS — R600 Localized edema: Secondary | ICD-10-CM | POA: Diagnosis not present

## 2018-02-01 DIAGNOSIS — R0689 Other abnormalities of breathing: Secondary | ICD-10-CM

## 2018-02-01 DIAGNOSIS — E039 Hypothyroidism, unspecified: Secondary | ICD-10-CM

## 2018-02-01 DIAGNOSIS — E785 Hyperlipidemia, unspecified: Secondary | ICD-10-CM

## 2018-02-01 DIAGNOSIS — I2510 Atherosclerotic heart disease of native coronary artery without angina pectoris: Secondary | ICD-10-CM

## 2018-02-01 LAB — CBC WITH DIFFERENTIAL/PLATELET
BASOS PCT: 0.8 % (ref 0.0–3.0)
Basophils Absolute: 0 10*3/uL (ref 0.0–0.1)
EOS PCT: 6 % — AB (ref 0.0–5.0)
Eosinophils Absolute: 0.2 10*3/uL (ref 0.0–0.7)
HEMATOCRIT: 44.7 % (ref 36.0–46.0)
HEMOGLOBIN: 14.9 g/dL (ref 12.0–15.0)
LYMPHS PCT: 36.4 % (ref 12.0–46.0)
Lymphs Abs: 1.3 10*3/uL (ref 0.7–4.0)
MCHC: 33.3 g/dL (ref 30.0–36.0)
MCV: 94.4 fl (ref 78.0–100.0)
MONOS PCT: 9.2 % (ref 3.0–12.0)
Monocytes Absolute: 0.3 10*3/uL (ref 0.1–1.0)
Neutro Abs: 1.7 10*3/uL (ref 1.4–7.7)
Neutrophils Relative %: 47.6 % (ref 43.0–77.0)
Platelets: 284 10*3/uL (ref 150.0–400.0)
RBC: 4.73 Mil/uL (ref 3.87–5.11)
RDW: 13.3 % (ref 11.5–15.5)
WBC: 3.7 10*3/uL — AB (ref 4.0–10.5)
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
## 2018-02-02 ENCOUNTER — Ambulatory Visit (HOSPITAL_COMMUNITY)
Admission: RE | Admit: 2018-02-02 | Discharge: 2018-02-02 | Disposition: A | Discharge status: Home / Self Care | Payer: Medicare Other | Source: Ambulatory Visit | Attending: Cardiovascular Disease | Admitting: Cardiovascular Disease

## 2018-02-02 DIAGNOSIS — R0689 Other abnormalities of breathing: Secondary | ICD-10-CM

## 2018-02-02 DIAGNOSIS — I82812 Embolism and thrombosis of superficial veins of left lower extremities: Secondary | ICD-10-CM | POA: Insufficient documentation

## 2018-02-02 DIAGNOSIS — R600 Localized edema: Secondary | ICD-10-CM | POA: Diagnosis not present

## 2018-02-02 LAB — D-DIMER, QUANTITATIVE: D-Dimer, Quant: 0.41 mcg/mL FEU (ref <0.50)

## 2018-02-13 ENCOUNTER — Encounter: Payer: Self-pay | Admitting: Family Medicine

## 2018-02-14 ENCOUNTER — Ambulatory Visit: Payer: Medicare Other | Admitting: Family Medicine

## 2018-02-20 ENCOUNTER — Encounter: Payer: Self-pay | Admitting: Family Medicine

## 2018-02-20 ENCOUNTER — Ambulatory Visit (INDEPENDENT_AMBULATORY_CARE_PROVIDER_SITE_OTHER): Payer: Medicare Other | Admitting: Family Medicine

## 2018-02-20 VITALS — BP 120/62 | HR 89 | Temp 97.5°F | Wt 130.0 lb

## 2018-02-20 DIAGNOSIS — R600 Localized edema: Secondary | ICD-10-CM | POA: Diagnosis not present

## 2018-02-20 NOTE — Progress Notes (Signed)
Subjective:    Patient ID: Sabrina Hart, female    DOB: 07/21/1932, 84 y.o.   MRN: [id number]  Chief Complaint  Patient presents with  . Edema    HPI Patient was seen today for f/u.  Pt states she has LLE and pedal edema.  Pt states her LLE would occasionally swell, but go back down.  Now her LLE will occur but remain.  Patient states she is now applying larger size shoes to accommodate the swelling in her left foot.  Patient was reminded that at last visit we discussed this issue.  Patient did not remember having a Doppler ultrasound of her left lower leg.  Patient was reminded of the results of the chronic nonocclusive superficial clot.  Patient states she has not been taking aspirin 81 mg daily, she states she does not need it.  Patient also began to lose provider about an incident where she had shortness of breath in the middle of the night.  This incident was previously discussed and addressed on office visit 02/01/18, however patient did not recall this.  Past Medical History:  Diagnosis Date  . Anginal pain (HCC)    1990's; 09/28/2012  . Arthritis    "very mild" (09/28/2012)  . Coronary artery disease 09/27/2012   s/p PCI of the RCA with DES with [place] in [place] 15 to 20 years ago. 2. cath 02/2013 patent mid RCA stent, 30% left main, 70% mid to distaal tanden 70% lesions x 3, 60% mid diag, 50% ostial LCx, 40% mid LCx.   . Glaucoma   . Hyperlipidemia   . Hypothyroidism     Allergies  Allergen Reactions  . Other Other (See Comments)    Metals; "skin gets weepy; gets worse if it makes contact" (09/28/2012)  . Pollen Extract Other (See Comments)    "sneezing; watery eyes"  . Latex Itching and Swelling    Skin swells  . Morphine And Related Rash  . Nickel Other (See Comments)    Causes weepiness (skin)  . Penicillins Rash    Has patient had a PCN reaction causing immediate rash, facial/tongue/throat swelling, SOB or lightheadedness with hypotension: Yes Has  patient had a PCN reaction causing severe rash involving mucus membranes or skin necrosis: Unk Has patient had a PCN reaction that required hospitalization: No  Has patient had a PCN reaction occurring within the last 10 years: No If all of the above answers are "NO", then may proceed with Cephalosporin use.     ROS General: Denies fever, chills, night sweats, changes in weight, changes in appetite HEENT: Denies headaches, ear pain, changes in vision, rhinorrhea, sore throat CV: Denies CP, palpitations, SOB, orthopnea Pulm: Denies SOB, cough, wheezing GI: Denies abdominal pain, nausea, vomiting, diarrhea, constipation GU: Denies dysuria, hematuria, frequency, vaginal discharge Msk: Denies muscle cramps, joint pains Neuro: Denies weakness, numbness, tingling Skin: Denies rashes, bruising Psych: Denies depression, anxiety, hallucinations     Objective:    Blood pressure 120/62, pulse 89, temperature (!) 97.5 F (36.4 C), temperature source Oral, weight 130 lb (59 kg), SpO2 98 %.   Gen. Pleasant, well-nourished, in no distress, normal affect   HEENT: [place]/AT, face symmetric, conjunctiva clear, no scleral icterus, PERRLA, nares patent without drainage, pharynx without erythema or exudate. Lungs: no accessory muscle use, CTAB, no wheezes or rales Cardiovascular: RRR, no m/r/g, peripheral edema of L foot Neuro:  A&Ox3, CN II-XII intact   Wt Readings from Last 3 Encounters:  02/20/18 130 lb (59 kg)  02/01/18 131 lb (59.4 kg)  01/16/18 134 lb (60.8 kg)    Lab Results  Component Value Date   WBC 3.7 (L) 02/01/2018   HGB 14.9 02/01/2018   HCT 44.7 02/01/2018   PLT 284.0 02/01/2018   GLUCOSE 97 01/16/2018   CHOL 192 11/12/2016   TRIG 46 11/12/2016   HDL 101 11/12/2016   LDLDIRECT 127.7 01/14/2014   LDLCALC 82 11/12/2016   ALT 13 11/12/2016   AST 21 11/12/2016   NA 139 01/16/2018   K 4.3 01/16/2018   CL 103 01/16/2018   CREATININE 0.66 01/16/2018   BUN 12 01/16/2018   CO2  32 01/16/2018   TSH 2.20 11/12/2016   INR 1.0 03/02/2013   HGBA1C 5.6 01/16/2018    Assessment/Plan:  Lower extremity edema -Discussed wearing TED hose or compression socks on left lower extremity. -Patient given Rx for TED hose -Discussed elevating lower extremities when sitting -Discussed decreasing sodium intake -Reviewed results of lower extremity ultrasound. -Patient encouraged to take aspirin 81 mg daily -Given handout on edema/lymphedema -Follow-up PRN in the next few months  Consider MMSE at next OFV as patient did not recall similar complaints from 02/01/2018.  Abbe Amsterdam, MD

## 2018-02-20 NOTE — Patient Instructions (Addendum)
Remember to start taking Aspirin 81 mg daily.   Lymphedema Lymphedema is swelling that is caused by the abnormal collection of lymph under the skin. Lymph is fluid from the tissues in your body that travels in the lymphatic system. This system is part of the immune system and includes lymph nodes and lymph vessels. The lymph vessels collect and carry the excess fluid, fats, proteins, and wastes from the tissues of the body to the bloodstream. This system also works to clean and remove bacteria and waste products from the body. Lymphedema occurs when the lymphatic system is blocked. When the lymph vessels or lymph nodes are blocked or damaged, lymph does not drain properly, causing an abnormal buildup of lymph. This leads to swelling in the arms or legs. Lymphedema cannot be cured by medicines, but various methods can be used to help reduce the swelling. What are the causes? There are two types of lymphedema. Primary lymphedema is caused by the absence or abnormality of the lymph vessel at birth. Secondary lymphedema is more common. It occurs when the lymph vessel is damaged or blocked. Common causes of lymph vessel blockage include:  Skin infection, such as cellulitis.  Infection by parasites (filariasis).  Injury.  Cancer.  Radiation therapy.  Formation of scar tissue.  Surgery.  What are the signs or symptoms? Symptoms of this condition include:  Swelling of the arm or leg.  A heavy or tight feeling in the arm or leg.  Swelling of the feet, toes, or fingers. Shoes or rings may fit more tightly than before.  Redness of the skin over the affected area.  Limited movement of the affected limb.  Sensitivity to touch or discomfort in the affected limb.  How is this diagnosed? This condition may be diagnosed with:  A physical exam.  Medical history.  Bioimpedance spectroscopy. In this test, painless electrical currents are used to measure fluid levels in your body.  Imaging  tests, such as: ? Lymphoscintigraphy. In this test, a low dose of a radioactive substance is injected to trace the flow of lymph through the lymph vessels. ? MRI. ? CT scan. ? Duplex ultrasound. This test uses sound waves to produce images of the vessels and the blood flow on a screen. ? Lymphangiography. In this test, a contrast dye is injected into the lymph vessel to help show blockages.  How is this treated? Treatment for this condition may depend on the cause. Treatment may include:  Exercise. Certain exercises can help fluid move out of the affected limb.  Massage. Gentle massage of the affected limb can help move the fluid out of the area.  Compression. Various methods may be used to apply pressure to the affected limb in order to reduce the swelling. ? Wearing compression stockings or sleeves on the affected limb. ? Bandaging the affected limb. ? Using an external pump that is attached to a sleeve that alternates between applying pressure and releasing pressure.  Surgery. This is usually only done for severe cases. For example, surgery may be done if you have trouble moving the limb or if the swelling does not get better with other treatments.  If an underlying condition is causing the lymphedema, treatment for that condition is needed. For example, antibiotic medicines may be used to treat an infection. Follow these instructions at home: Activities  Exercise regularly as directed by your health care provider.  Do not sit with your legs crossed.  When possible, keep the affected limb raised (elevated) above the  level of your heart.  Avoid carrying things with an arm that is affected by lymphedema.  Remember that the affected area is more likely to become injured or infected.  Take these steps to help prevent infection: ? Keep the affected area clean and dry. ? Protect your skin from cuts. For example, you should use gloves while cooking or gardening. Do not walk barefoot.  If you shave the affected area, use an Neurosurgeonelectric razor. General instructions  Take medicines only as directed by your health care provider.  Eat a healthy diet that includes a lot of fruits and vegetables.  Do not wear tight clothes, shoes, or jewelry.  Do not use heating pads over the affected area.  Avoid having blood pressure checked on the affected limb.  Keep all follow-up visits as directed by your health care provider. This is important. Contact a health care provider if:  You continue to have swelling in your limb.  You have a fever.  You have a cut that does not heal.  You have redness or pain in the affected area.  You have new swelling in your limb that comes on suddenly.  You develop purplish spots or sores (lesions) on your limb. Get help right away if:  You have a skin rash.  You have chills or sweats.  You have shortness of breath. This information is not intended to replace advice given to you by your health care provider. Make sure you discuss any questions you have with your health care provider. Document Released: 09/12/2007 Document Revised: 07/22/2016 Document Reviewed: 10/23/2014 Elsevier Interactive Patient Education  [employer]2018 Elsevier Inc.

## 2018-02-24 ENCOUNTER — Other Ambulatory Visit: Payer: Self-pay | Admitting: Family Medicine

## 2018-02-24 DIAGNOSIS — E785 Hyperlipidemia, unspecified: Secondary | ICD-10-CM

## 2018-02-24 DIAGNOSIS — E039 Hypothyroidism, unspecified: Secondary | ICD-10-CM

## 2018-02-24 DIAGNOSIS — I2510 Atherosclerotic heart disease of native coronary artery without angina pectoris: Secondary | ICD-10-CM

## 2018-03-25 ENCOUNTER — Other Ambulatory Visit: Payer: Self-pay | Admitting: Family Medicine

## 2018-03-25 DIAGNOSIS — E785 Hyperlipidemia, unspecified: Secondary | ICD-10-CM

## 2018-03-25 DIAGNOSIS — I2510 Atherosclerotic heart disease of native coronary artery without angina pectoris: Secondary | ICD-10-CM

## 2018-03-25 DIAGNOSIS — E039 Hypothyroidism, unspecified: Secondary | ICD-10-CM

## 2018-03-27 ENCOUNTER — Encounter: Payer: Self-pay | Admitting: *Deleted

## 2018-03-27 DIAGNOSIS — Z006 Encounter for examination for normal comparison and control in clinical research program: Secondary | ICD-10-CM

## 2018-03-27 NOTE — Progress Notes (Signed)
Subject to research clinic for [id number] in the [facility] 10 research study.  No c/o, aes or saes to report. Injection given and subject remained in clinic for 30 minutes following injection per protocol. Next appointment scheduled.

## 2018-04-02 ENCOUNTER — Other Ambulatory Visit: Payer: Self-pay | Admitting: Cardiovascular Disease

## 2018-04-18 ENCOUNTER — Other Ambulatory Visit: Payer: Self-pay | Admitting: Family Medicine

## 2018-04-18 DIAGNOSIS — I2510 Atherosclerotic heart disease of native coronary artery without angina pectoris: Secondary | ICD-10-CM

## 2018-04-18 DIAGNOSIS — E039 Hypothyroidism, unspecified: Secondary | ICD-10-CM

## 2018-04-18 DIAGNOSIS — E785 Hyperlipidemia, unspecified: Secondary | ICD-10-CM

## 2018-06-14 ENCOUNTER — Other Ambulatory Visit: Payer: Self-pay

## 2018-06-14 NOTE — Patient Outreach (Signed)
[facility] Care Management  06/14/2018  Sabrina LeveeMary Henrietta Hart 07/05/1932 [id number]   Medication Adherence call to Mrs. Janean SarkMary Markel left a message for patient to call back patient is due on Atorvastatin 40 mg.[name] is showing past due under [facility].  Sabrina AbedAna Hart CPhT Pharmacy Technician [facility] Care Management Direct Dial [phone]  Fax [phone] [email or web address]

## 2018-06-19 ENCOUNTER — Encounter: Payer: Medicare Other | Admitting: *Deleted

## 2018-06-19 VITALS — BP 182/86 | HR 64 | Resp 16 | Wt 130.0 lb

## 2018-06-19 DIAGNOSIS — Z006 Encounter for examination for normal comparison and control in clinical research program: Secondary | ICD-10-CM

## 2018-06-20 NOTE — Progress Notes (Signed)
Subject to research clinic for [id number] in the [facility].  No cos, aes or saes to report.  Next visit scheduled.

## 2018-07-07 ENCOUNTER — Encounter: Payer: Self-pay | Admitting: *Deleted

## 2018-07-07 ENCOUNTER — Other Ambulatory Visit: Payer: Self-pay

## 2018-07-07 ENCOUNTER — Other Ambulatory Visit: Payer: Self-pay | Admitting: Primary Care

## 2018-07-07 DIAGNOSIS — Z006 Encounter for examination for normal comparison and control in clinical research program: Secondary | ICD-10-CM

## 2018-07-07 DIAGNOSIS — I2510 Atherosclerotic heart disease of native coronary artery without angina pectoris: Secondary | ICD-10-CM

## 2018-07-07 NOTE — Progress Notes (Signed)
Subject to Research clinic for EOS visit [facility] the [facility] Research study.  No cos, aes or saes to report.  Per subject she  has not been taking any medications for the past two months.  After meeting with PI orders were received for a TSH panel which was drawn and sent for results, a visit to Dr. Salomon FickBanks, her PCP was scheduled and [facility] phone [facility] [address] for any medications that were able to be refilled.  A script for Synthroid had expired and the pharmacy was asked to request a refill from Dr. Salomon FickBanks' office.  These were explained to the patient and she left our office and went to [facility] to [facility] her medication.
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
## 2018-07-08 LAB — TSH: TSH: 134.2 u[IU]/mL — ABNORMAL HIGH (ref 0.450–4.500)

## 2018-07-10 ENCOUNTER — Telehealth: Payer: Self-pay | Admitting: *Deleted

## 2018-07-10 ENCOUNTER — Telehealth: Payer: Self-pay | Admitting: Family Medicine

## 2018-07-10 NOTE — Telephone Encounter (Signed)
Called to speak with patient re: labwork drawn at the Research clinic on Friday July 07, 2018.  Left message to contact our office at [phone].

## 2018-07-10 NOTE — Telephone Encounter (Signed)
Copied from CRM [phone]. Topic: Quick Communication - See Telephone Encounter >> Jul 10, 2018  2:19 PM Windy KalataMichael, Maurita Havener L, NT wrote: CRM for notification. See Telephone encounter for: 07/10/18.  Dr. Inocente Sallesom Stucky is calling in regards to this patient and is requesting a call back from Dr. Salomon FickBanks promptly. I informed him that Dr. Salomon FickBanks was out of the office today and asked if another provider could help. He stated he would call back tomorrow but would like for me to get a message to her and requesting a call back from Dr. Salomon FickBanks at [phone]

## 2018-07-11 ENCOUNTER — Telehealth: Payer: Self-pay | Admitting: Cardiology

## 2018-07-11 NOTE — Telephone Encounter (Signed)
I spoke with Dr. Riley KillStuckey, (co founder of cardiovascular research foundation) he wanted to give update on patient. She is currently in a research study for an injectable medication to lower cholesterol. She was recently seen and explained that she had stopped all of her medications some time ago. Recent lab for TSH is 134, the lab results are going to be uploaded shortly in EPIC. Pt is on the schedule to see Dr. Salomon FickBanks on 07/21/2018. He recommends patient come in to the office ASAP. Please advise?

## 2018-07-11 NOTE — Telephone Encounter (Signed)
Please advise 

## 2018-07-11 NOTE — Telephone Encounter (Signed)
I called the patient one day earlier, and spoke with Sabrina Hart in the office as Dr. Salomon FickBanks was in a room.  I gave her relevant labwork (TSH, CPK, ALT, AST, and others) and background, and suggested follow up promptly.  Patient has stopped thyroid hormone months ago, and TSH is elevated.   I sent a staff message to Dr. Salomon FickBanks one day earlier, and also called patient and left a message as she does not answer her phone.  I encouraged me to call if she has questions.    Patient has completed the [place] study.

## 2018-07-11 NOTE — Telephone Encounter (Signed)
Aware of below note.  Can try to contact pt for earlier appt.

## 2018-07-21 ENCOUNTER — Ambulatory Visit (INDEPENDENT_AMBULATORY_CARE_PROVIDER_SITE_OTHER): Payer: Medicare Other | Admitting: Family Medicine

## 2018-07-21 ENCOUNTER — Encounter: Payer: Self-pay | Admitting: Family Medicine

## 2018-07-21 VITALS — BP 142/82 | HR 74 | Temp 98.3°F | Wt 131.0 lb

## 2018-07-21 DIAGNOSIS — E039 Hypothyroidism, unspecified: Secondary | ICD-10-CM | POA: Diagnosis not present

## 2018-07-21 DIAGNOSIS — I25119 Atherosclerotic heart disease of native coronary artery with unspecified angina pectoris: Secondary | ICD-10-CM

## 2018-07-21 DIAGNOSIS — R030 Elevated blood-pressure reading, without diagnosis of hypertension: Secondary | ICD-10-CM

## 2018-07-21 MED ORDER — LEVOTHYROXINE SODIUM 125 MCG PO TABS: ORAL_TABLET | ORAL | 3 refills | Status: DC

## 2018-07-21 NOTE — Progress Notes (Signed)
Subjective:    Patient ID: Sabrina Hart, female    DOB: Jul 31, 1932, 84 y.o.   MRN: [id number]  No chief complaint on file.   HPI Patient was seen today for follow-up.  Pt recently seen by Dr. Riley Kill, in part of a cholesterol study.  At that time labs were drawn and pt's TSH was noted to be extremely elevated at 134.2.  Pt states she has not been taking her levothyroxine.  Patient states every 10 years she stopped taking the medication to see if she really needs it.  Pt feels "chemicals are being pumped into my house".  Pt continues to endorse waking up in the middle of the night feeling SOB.  During her previous episode EMS was called but patient refused to go to the ED.  Pt also feels her daughter is preventing her from receiving phone calls.  Pt feels the static in the background of the phone is proof she is listening.   Past Medical History:  Diagnosis Date  . Anginal pain (HCC)    1990's; 09/28/2012  . Arthritis    "very mild" (09/28/2012)  . Coronary artery disease 09/27/2012   s/p PCI of the RCA with DES with remote PCI in [place] 15 to 20 years ago. 2. cath 02/2013 patent mid RCA stent, 30% left main, 70% mid to distaal tanden 70% lesions x 3, 60% mid diag, 50% ostial LCx, 40% mid LCx.   . Glaucoma   . Hyperlipidemia   . Hypothyroidism     Allergies  Allergen Reactions  . Other Other (See Comments)    Metals; "skin gets weepy; gets worse if it makes contact" (09/28/2012)  . Pollen Extract Other (See Comments)    "sneezing; watery eyes"  . Latex Itching and Swelling    Skin swells  . Morphine And Related Rash  . Nickel Other (See Comments)    Causes weepiness (skin)  . Penicillins Rash    Has patient had a PCN reaction causing immediate rash, facial/tongue/throat swelling, SOB or lightheadedness with hypotension: Yes Has patient had a PCN reaction causing severe rash involving mucus membranes or skin necrosis: Unk Has patient had a PCN reaction that required  hospitalization: No  Has patient had a PCN reaction occurring within the last 10 years: No If all of the above answers are "NO", then may proceed with Cephalosporin use.     ROS General: Denies fever, chills, night sweats, changes in weight, changes in appetite HEENT: Denies headaches, ear pain, changes in vision, rhinorrhea, sore throat CV: Denies CP, palpitations, orthopnea  +SOB Pulm: Denies cough, wheezing  +SOB GI: Denies abdominal pain, nausea, vomiting, diarrhea, constipation GU: Denies dysuria, hematuria, frequency, vaginal discharge Msk: Denies muscle cramps, joint pains Neuro: Denies weakness, numbness, tingling Skin: Denies rashes, bruising Psych: Denies depression, anxiety, hallucinations     Objective:    Blood pressure (!) 142/82, pulse 74, temperature 98.3 F (36.8 C), temperature source Oral, weight 131 lb (59.4 kg), SpO2 96 %.   Gen. Pleasant, well-nourished, in no distress, normal affect   HEENT: [place]/AT, face symmetric, eyes prominent, conjunctiva clear, no scleral icterus, PERRLA, nares patent without drainage Lungs: no accessory muscle use, CTAB, no wheezes or rales Cardiovascular: RRR, no m/r/g, no peripheral edema Neuro:  A&Ox3, CN II-XII intact, normal gait  Wt Readings from Last 3 Encounters:  07/21/18 131 lb (59.4 kg)  07/07/18 131 lb (59.4 kg)  06/19/18 130 lb (59 kg)    Lab Results  Component Value Date  WBC 3.7 (L) 02/01/2018   HGB 14.9 02/01/2018   HCT 44.7 02/01/2018   PLT 284.0 02/01/2018   GLUCOSE 97 01/16/2018   CHOL 192 11/12/2016   TRIG 46 11/12/2016   HDL 101 11/12/2016   LDLDIRECT 127.7 01/14/2014   LDLCALC 82 11/12/2016   ALT 13 11/12/2016   AST 21 11/12/2016   NA 139 01/16/2018   K 4.3 01/16/2018   CL 103 01/16/2018   CREATININE 0.66 01/16/2018   BUN 12 01/16/2018   CO2 32 01/16/2018   TSH 134.200 (H) 07/07/2018   INR 1.0 03/02/2013   HGBA1C 5.6 01/16/2018    Assessment/Plan:  Acquired hypothyroidism -Patient  advised to start taking Synthroid 125 mcg every morning consistently. -We will recheck TSH in 6 weeks -Given handout and discussed possible symptoms associated with not taking her medication.  - Plan: TSH, levothyroxine (SYNTHROID, LEVOTHROID) 125 MCG tablet  Elevated blood pressure reading in office without diagnosis of hypertension -Discussed elevated BP -Patient encouraged to monitor sodium intake and increase p.o. intake of water -If elevated at next OFV will discuss starting medication.  Atherosclerosis of native coronary artery -Continue Lipitor 40 mg daily  Follow-up in 6 weeks, sooner if needed.  Concern for patient noted by this provider and given this OFV.  Will consider exploring options for pt assistance/outreach.  Hesitant to involve pt's daughter given their family dynamic.  Abbe AmsterdamShannon Kelsi Benham, MD

## 2018-07-21 NOTE — Patient Instructions (Signed)
Hypothyroidism Hypothyroidism is a disorder of the thyroid. The thyroid is a large gland that is located in the lower front of the neck. The thyroid releases hormones that control how the body works. With hypothyroidism, the thyroid does not make enough of these hormones. What are the causes? Causes of hypothyroidism may include:  Viral infections.  Pregnancy.  Your own defense system (immune system) attacking your thyroid.  Certain medicines.  Birth defects.  Past radiation treatments to your head or neck.  Past treatment with radioactive iodine.  Past surgical removal of part or all of your thyroid.  Problems with the gland that is located in the center of your brain (pituitary).  What are the signs or symptoms? Signs and symptoms of hypothyroidism may include:  Feeling as though you have no energy (lethargy).  Inability to tolerate cold.  Weight gain that is not explained by a change in diet or exercise habits.  Dry skin.  Coarse hair.  Menstrual irregularity.  Slowing of thought processes.  Constipation.  Sadness or depression.  How is this diagnosed? Your health care provider may diagnose hypothyroidism with blood tests and ultrasound tests. How is this treated? Hypothyroidism is treated with medicine that replaces the hormones that your body does not make. After you begin treatment, it may take several weeks for symptoms to go away. Follow these instructions at home:  Take medicines only as directed by your health care provider.  If you start taking any new medicines, tell your health care provider.  Keep all follow-up visits as directed by your health care provider. This is important. As your condition improves, your dosage needs may change. You will need to have blood tests regularly so that your health care provider can watch your condition. Contact a health care provider if:  Your symptoms do not get better with treatment.  You are taking thyroid  replacement medicine and: ? You sweat excessively. ? You have tremors. ? You feel anxious. ? You lose weight rapidly. ? You cannot tolerate heat. ? You have emotional swings. ? You have diarrhea. ? You feel weak. Get help right away if:  You develop chest pain.  You develop an irregular heartbeat.  You develop a rapid heartbeat. This information is not intended to replace advice given to you by your health care provider. Make sure you discuss any questions you have with your health care provider. Document Released: 11/15/2005 Document Revised: 04/22/2016 Document Reviewed: 04/02/2014 Elsevier Interactive Patient Education  2018 Elsevier Inc.  

## 2018-07-28 ENCOUNTER — Encounter: Payer: Self-pay | Admitting: Family Medicine

## 2018-07-29 ENCOUNTER — Other Ambulatory Visit: Payer: Self-pay | Admitting: Family Medicine

## 2018-07-29 DIAGNOSIS — E785 Hyperlipidemia, unspecified: Secondary | ICD-10-CM

## 2018-07-29 DIAGNOSIS — E039 Hypothyroidism, unspecified: Secondary | ICD-10-CM

## 2018-07-29 DIAGNOSIS — I2510 Atherosclerotic heart disease of native coronary artery without angina pectoris: Secondary | ICD-10-CM

## 2018-09-05 ENCOUNTER — Other Ambulatory Visit: Payer: Self-pay | Admitting: Family Medicine

## 2018-09-05 DIAGNOSIS — E785 Hyperlipidemia, unspecified: Secondary | ICD-10-CM

## 2018-09-05 DIAGNOSIS — E039 Hypothyroidism, unspecified: Secondary | ICD-10-CM

## 2018-09-05 DIAGNOSIS — I2510 Atherosclerotic heart disease of native coronary artery without angina pectoris: Secondary | ICD-10-CM

## 2018-09-06 NOTE — Telephone Encounter (Signed)
Pt needs an OV for more refills. 

## 2018-09-14 ENCOUNTER — Other Ambulatory Visit: Payer: Self-pay | Admitting: Family Medicine

## 2018-09-14 DIAGNOSIS — I2510 Atherosclerotic heart disease of native coronary artery without angina pectoris: Secondary | ICD-10-CM

## 2018-09-14 DIAGNOSIS — E785 Hyperlipidemia, unspecified: Secondary | ICD-10-CM

## 2018-09-14 DIAGNOSIS — E039 Hypothyroidism, unspecified: Secondary | ICD-10-CM

## 2018-09-25 DIAGNOSIS — H6993 Unspecified Eustachian tube disorder, bilateral: Secondary | ICD-10-CM | POA: Diagnosis not present

## 2018-09-25 DIAGNOSIS — R490 Dysphonia: Secondary | ICD-10-CM | POA: Diagnosis not present

## 2018-09-25 DIAGNOSIS — J310 Chronic rhinitis: Secondary | ICD-10-CM | POA: Diagnosis not present

## 2018-10-03 ENCOUNTER — Other Ambulatory Visit: Payer: Self-pay

## 2018-10-03 ENCOUNTER — Emergency Department (HOSPITAL_COMMUNITY)
Admission: EM | Admit: 2018-10-03 | Discharge: 2018-10-04 | Disposition: A | Discharge status: Home / Self Care | Payer: Medicare Other | Attending: Emergency Medicine | Admitting: Emergency Medicine

## 2018-10-03 ENCOUNTER — Encounter (HOSPITAL_COMMUNITY): Payer: Self-pay | Admitting: Emergency Medicine

## 2018-10-03 ENCOUNTER — Emergency Department (HOSPITAL_COMMUNITY): Payer: Medicare Other

## 2018-10-03 ENCOUNTER — Ambulatory Visit: Payer: Self-pay | Admitting: *Deleted

## 2018-10-03 DIAGNOSIS — Z91040 Latex allergy status: Secondary | ICD-10-CM | POA: Diagnosis not present

## 2018-10-03 DIAGNOSIS — F0391 Unspecified dementia with behavioral disturbance: Secondary | ICD-10-CM | POA: Insufficient documentation

## 2018-10-03 DIAGNOSIS — I2510 Atherosclerotic heart disease of native coronary artery without angina pectoris: Secondary | ICD-10-CM | POA: Diagnosis not present

## 2018-10-03 DIAGNOSIS — F4325 Adjustment disorder with mixed disturbance of emotions and conduct: Secondary | ICD-10-CM | POA: Diagnosis not present

## 2018-10-03 DIAGNOSIS — Z9119 Patient's noncompliance with other medical treatment and regimen: Secondary | ICD-10-CM | POA: Diagnosis not present

## 2018-10-03 DIAGNOSIS — I10 Essential (primary) hypertension: Secondary | ICD-10-CM | POA: Diagnosis not present

## 2018-10-03 DIAGNOSIS — Z79899 Other long term (current) drug therapy: Secondary | ICD-10-CM | POA: Diagnosis not present

## 2018-10-03 DIAGNOSIS — E0590 Thyrotoxicosis, unspecified without thyrotoxic crisis or storm: Secondary | ICD-10-CM | POA: Diagnosis not present

## 2018-10-03 DIAGNOSIS — E039 Hypothyroidism, unspecified: Secondary | ICD-10-CM | POA: Diagnosis not present

## 2018-10-03 DIAGNOSIS — R4020 Unspecified coma: Secondary | ICD-10-CM | POA: Diagnosis not present

## 2018-10-03 DIAGNOSIS — R0902 Hypoxemia: Secondary | ICD-10-CM | POA: Diagnosis not present

## 2018-10-03 DIAGNOSIS — Z9114 Patient's other noncompliance with medication regimen: Secondary | ICD-10-CM

## 2018-10-03 DIAGNOSIS — R410 Disorientation, unspecified: Secondary | ICD-10-CM | POA: Diagnosis not present

## 2018-10-03 LAB — CBC WITH DIFFERENTIAL/PLATELET
Abs Immature Granulocytes: 0.03 10*3/uL (ref 0.00–0.07)
BASOS ABS: 0 10*3/uL (ref 0.0–0.1)
BASOS PCT: 1 %
EOS PCT: 4 %
Eosinophils Absolute: 0.2 10*3/uL (ref 0.0–0.5)
HCT: 40.2 % (ref 36.0–46.0)
HEMOGLOBIN: 13.6 g/dL (ref 12.0–15.0)
Immature Granulocytes: 1 %
LYMPHS PCT: 25 %
Lymphs Abs: 0.9 10*3/uL (ref 0.7–4.0)
MCH: 32.1 pg (ref 26.0–34.0)
MCHC: 33.8 g/dL (ref 30.0–36.0)
MCV: 94.8 fL (ref 80.0–100.0)
MONO ABS: 0.3 10*3/uL (ref 0.1–1.0)
Monocytes Relative: 9 %
NEUTROS ABS: 2.3 10*3/uL (ref 1.7–7.7)
NRBC: 0 % (ref 0.0–0.2)
Neutrophils Relative %: 60 %
PLATELETS: 257 10*3/uL (ref 150–400)
RBC: 4.24 MIL/uL (ref 3.87–5.11)
RDW: 13.6 % (ref 11.5–15.5)
WBC: 3.8 10*3/uL — AB (ref 4.0–10.5)

## 2018-10-03 LAB — COMPREHENSIVE METABOLIC PANEL
ALBUMIN: 4 g/dL (ref 3.5–5.0)
ALT: 32 U/L (ref 0–44)
AST: 84 U/L — AB (ref 15–41)
Alkaline Phosphatase: 64 U/L (ref 38–126)
Anion gap: 10 (ref 5–15)
BILIRUBIN TOTAL: 1.3 mg/dL — AB (ref 0.3–1.2)
BUN: 11 mg/dL (ref 8–23)
CO2: 28 mmol/L (ref 22–32)
CREATININE: 1.19 mg/dL — AB (ref 0.44–1.00)
Calcium: 9.3 mg/dL (ref 8.9–10.3)
Chloride: 96 mmol/L — ABNORMAL LOW (ref 98–111)
GFR calc Af Amer: 47 mL/min — ABNORMAL LOW (ref >60)
GFR, EST NON AFRICAN AMERICAN: 41 mL/min — AB (ref >60)
GLUCOSE: 94 mg/dL (ref 70–99)
Potassium: 3.4 mmol/L — ABNORMAL LOW (ref 3.5–5.1)
Sodium: 134 mmol/L — ABNORMAL LOW (ref 135–145)
TOTAL PROTEIN: 6.8 g/dL (ref 6.5–8.1)

## 2018-10-03 LAB — RAPID URINE DRUG SCREEN, HOSP PERFORMED
AMPHETAMINES: NOT DETECTED
BARBITURATES: NOT DETECTED
BENZODIAZEPINES: NOT DETECTED
Cocaine: NOT DETECTED
Opiates: NOT DETECTED
Tetrahydrocannabinol: NOT DETECTED

## 2018-10-03 LAB — URINALYSIS, ROUTINE W REFLEX MICROSCOPIC
Bilirubin Urine: NEGATIVE
GLUCOSE, UA: NEGATIVE mg/dL
Ketones, ur: 5 mg/dL — AB
Leukocytes, UA: NEGATIVE
NITRITE: NEGATIVE
PH: 7 (ref 5.0–8.0)
PROTEIN: NEGATIVE mg/dL
SPECIFIC GRAVITY, URINE: 1.012 (ref 1.005–1.030)

## 2018-10-03 LAB — ETHANOL

## 2018-10-03 LAB — TSH: TSH: 118.08 u[IU]/mL — ABNORMAL HIGH (ref 0.350–4.500)

## 2018-10-03 MED ORDER — ALUM & MAG HYDROXIDE-SIMETH 200-200-20 MG/5ML PO SUSP: 30.0000 mL | Freq: Four times a day (QID) | ORAL | Status: DC | PRN

## 2018-10-03 MED ORDER — ONDANSETRON HCL 4 MG PO TABS: 4.0000 mg | ORAL_TABLET | Freq: Three times a day (TID) | ORAL | Status: DC | PRN

## 2018-10-03 MED ORDER — LEVOTHYROXINE SODIUM 125 MCG PO TABS
125.0000 ug | ORAL_TABLET | Freq: Every day | ORAL | Status: DC
  Administered 2018-10-04: 125 ug via ORAL
  Filled 2018-10-03 (×2): qty 1

## 2018-10-03 MED ORDER — ACETAMINOPHEN 325 MG PO TABS: 650.0000 mg | ORAL_TABLET | ORAL | Status: DC | PRN

## 2018-10-03 NOTE — Telephone Encounter (Signed)
Pt's daughter calling; unable to triage patient as pt was not present during call. Daughter states pt is "Having a mental breakdown." States is at neighbors and "Won't come home because people are watching her." States is "Going to walk to the bank to get her money." States pt left her home and walked to the neighbors. H/O dementia. Instructed daughter to call EMS to the neighbors home. Informed [place] would alert Dr. Salomon Fick to situation.

## 2018-10-03 NOTE — ED Notes (Signed)
Pt transported to Xray. 

## 2018-10-03 NOTE — ED Triage Notes (Signed)
Pt BIB [facility] from home, pt found walking down the street by neighbor. Pt had a diagnosis of dementia last year, not currently taking medications. Family requesting psych consult. Pt A&O to baseline, no complaints at this time. BP-184/98 P-105

## 2018-10-03 NOTE — ED Provider Notes (Signed)
[facility] EMERGENCY DEPARTMENT Provider Note   CSN: [id number] Arrival date & time: 10/03/18  1529     History   Chief Complaint Chief Complaint  Patient presents with  . Dementia    HPI Sabrina Hart is a 84 y.o. female.  Pt presents to the ED today with worsening of dementia.  According to pt's son-in-law, pt was diagnosed with dementia about 1 year ago.  She is not taking any meds for it.  Over the last year, sx have gotten worse.  She stopped taking all her meds this summer because she said she likes to have a break from her medicines.  The pt said she's started back taking them, but "only when she remembers."  Her son-in-law said she's been accusing her family of stealing things in her house.  There are cameras in the house and he's looked through them, no one has been there.  He has been able to find money she thinks has been missing by looking at the cameras to see where's she's last put it.  She told her PCP back in August that she thinks chemicals are being pumped into her house.  She feels like her daughter is somehow preventing her from receiving phone calls.  She can only tolerate her son-in-law as she thinks everyone else is stealing from her.  Today, she was found wandering around her street lost.  A neighbor found her and called her family.  She did the same thing last week.  The son-in-law said he is worried she's going to hurt herself, and he did not know what else to do.  The pt said she is not in any physical pain, just emotional pain.  She denies SI/HI.  She also said that she's planning to move to [place] which is news to son-in-law.     Past Medical History:  Diagnosis Date  . Anginal pain (HCC)    1990's; 09/28/2012  . Arthritis    "very mild" (09/28/2012)  . Coronary artery disease 09/27/2012   s/p PCI of the RCA with DES with remote PCI in [place] 15 to 20 years ago. 2. cath 02/2013 patent mid RCA stent, 30% left main, 70% mid  to distaal tanden 70% lesions x 3, 60% mid diag, 50% ostial LCx, 40% mid LCx.   . Glaucoma   . Hyperlipidemia   . Hypothyroidism     Patient Active Problem List   Diagnosis Date Noted  . Lower extremity edema 04/26/2017  . Unstable angina (HCC) 03/02/2013  . Hypothyroidism 09/29/2012  . Hyperlipidemia 09/29/2012  . Hypokalemia 09/29/2012  . CAD (coronary artery disease) 09/27/2012    Past Surgical History:  Procedure Laterality Date  . CARDIAC CATHETERIZATION  1990s   PCI  . CATARACT EXTRACTION W/ INTRAOCULAR LENS  IMPLANT, BILATERAL  1970's  . CORONARY ANGIOPLASTY WITH STENT PLACEMENT  09/28/2012   20-30% diffuse LAD stenosis, mild-mod D1 dz, 70% distal D1 lesion, mild diffuse LCx dz, 50-60% OM1 stenosis, 60% mid & 90% mid-dis RCA stenosis s/p DES; LVEF 75-80% w/ hyperdynamic fxn.   . DENTAL SURGERY     "implants"  . INNER EAR SURGERY  ~ 2010   "put plugs in so I could fly" (09/28/2012)  . LEFT HEART CATHETERIZATION WITH CORONARY ANGIOGRAM N/A 09/28/2012   Procedure: LEFT HEART CATHETERIZATION WITH CORONARY ANGIOGRAM;  Surgeon: [name], MD;  Location: [facility] CATH LAB;  Service: Cardiovascular;  Laterality: N/A;  . LUMBAR DISC SURGERY  1980's   "  took out a little piece" (09/28/2012)  . PERCUTANEOUS CORONARY STENT INTERVENTION (PCI-S)  09/28/2012   Procedure: PERCUTANEOUS CORONARY STENT INTERVENTION (PCI-S);  Surgeon: [name], MD;  Location: [facility] CATH LAB;  Service: Cardiovascular;;  . REDUCTION MAMMAPLASTY  1970's     OB History   None      Home Medications    Prior to Admission medications   Medication Sig Start Date End Date Taking? Authorizing Provider  atorvastatin (LIPITOR) 40 MG tablet TAKE 1 TABLET BY MOUTH EVERY DAY 09/06/18  Yes Deeann Saint, MD  acetaminophen (TYLENOL) 500 MG tablet Take 2 tablets (1,000 mg total) by mouth every 6 (six) hours as needed. Patient not taking: Reported on 10/03/2018 10/29/17   Arby Barrette, MD  AMBULATORY NON  FORMULARY MEDICATION Take 300 mg by mouth as directed. Medication Name: Inclirisan sodium 300mg  vs placebo (orion-10 Study per protocol Patient not taking: Reported on 10/03/2018 12/30/16   Herby Abraham, MD  aspirin EC 81 MG tablet Take 1 tablet (81 mg total) by mouth daily. Patient not taking: Reported on 10/03/2018 05/17/17   Manson Passey, PA  atorvastatin (LIPITOR) 40 MG tablet TAKE 1 TABLET BY MOUTH EVERY DAY Patient not taking: Reported on 10/03/2018 09/15/18   Deeann Saint, MD  levothyroxine (SYNTHROID, LEVOTHROID) 125 MCG tablet Take 1 tablet by mouth every morning on an empty stomach with a full glass of water. Patient not taking: Reported on 10/03/2018 07/21/18   Deeann Saint, MD  nitroGLYCERIN (NITROSTAT) 0.4 MG SL tablet PLACE 1 TABLET UNDER THE TONGUE EVERY 5 MINUTES AS NEEDED FOR CHEST PAIN. Patient not taking: Reported on 10/03/2018 04/03/18   Nahser, Deloris Ping, MD    Family History History reviewed. No pertinent family history.  Social History Social History   Tobacco Use  . Smoking status: Never Smoker  . Smokeless tobacco: Never Used  Substance Use Topics  . Alcohol use: Yes    Alcohol/week: 1.0 standard drinks    Types: 1 Glasses of wine per week    Comment: 09/28/2012 "1 glass of wine averages once/wk; beer average once/week during summer w/friends"  . Drug use: No     Allergies   Other; Pollen extract; Latex; Morphine and related; Nickel; and Penicillins   Review of Systems Review of Systems  Psychiatric/Behavioral: Positive for confusion.       Paranoia  All other systems reviewed and are negative.    Physical Exam Updated Vital Signs BP (!) 136/53 (BP Location: Left Arm)   Pulse 78   Temp 97.8 F (36.6 C) (Oral)   Resp 18   SpO2 98%   Physical Exam  Constitutional: She appears well-developed and well-nourished.  HENT:  Head: Normocephalic and atraumatic.  Right Ear: External ear normal.  Left Ear: External ear normal.  Nose: Nose  normal.  Mouth/Throat: Oropharynx is clear and moist.  Eyes: Pupils are equal, round, and reactive to light. Conjunctivae and EOM are normal.  Neck: Normal range of motion. Neck supple.  Cardiovascular: Normal rate, regular rhythm, normal heart sounds and intact distal pulses.  Pulmonary/Chest: Effort normal and breath sounds normal.  Abdominal: Soft. Bowel sounds are normal.  Musculoskeletal: Normal range of motion.  Neurological: She is alert.  Skin: Skin is warm. Capillary refill takes less than 2 seconds.  Psychiatric: Thought content is paranoid and delusional. She exhibits abnormal recent memory.  Nursing note and vitals reviewed.    ED Treatments / Results  Labs (all labs ordered are listed, but  only abnormal results are displayed) Labs Reviewed  COMPREHENSIVE METABOLIC PANEL - Abnormal; Notable for the following components:      Result Value   Sodium 134 (*)    Potassium 3.4 (*)    Chloride 96 (*)    Creatinine, Ser 1.19 (*)    AST 84 (*)    Total Bilirubin 1.3 (*)    GFR calc non Af Amer 41 (*)    GFR calc Af Amer 47 (*)    All other components within normal limits  CBC WITH DIFFERENTIAL/PLATELET - Abnormal; Notable for the following components:   WBC 3.8 (*)    All other components within normal limits  URINALYSIS, ROUTINE W REFLEX MICROSCOPIC - Abnormal; Notable for the following components:   Hgb urine dipstick SMALL (*)    Ketones, ur 5 (*)    Bacteria, UA RARE (*)    All other components within normal limits  TSH - Abnormal; Notable for the following components:   TSH 118.080 (*)    All other components within normal limits  ETHANOL  RAPID URINE DRUG SCREEN, HOSP PERFORMED    EKG EKG Interpretation  Date/Time:  Tuesday October 03 2018 16:44:06 EST Ventricular Rate:  76 PR Interval:    QRS Duration: 72 QT Interval:  336 QTC Calculation: 378 R Axis:   -53 Text Interpretation:  Normal sinus rhythm Left axis deviation Low voltage QRS Septal infarct ,  age undetermined Abnormal ECG No significant change since last tracing Confirmed by Jacalyn Lefevre [phone]) on 10/03/2018 4:47:28 PM   Radiology Dg Chest 2 View  Result Date: 10/03/2018 CLINICAL DATA:  Altered level of consciousness. EXAM: CHEST - 2 VIEW COMPARISON:  Radiographs of October 29, 2017. FINDINGS: Stable cardiomediastinal silhouette. No pneumothorax or pleural effusion is noted. Lungs are clear. Bony thorax is unremarkable. IMPRESSION: No active cardiopulmonary disease. Electronically Signed   By: Lupita Raider, M.D.   On: 10/03/2018 16:41    Procedures Procedures (including critical care time)  Medications Ordered in ED Medications  acetaminophen (TYLENOL) tablet 650 mg (has no administration in time range)  alum & mag hydroxide-simeth (MAALOX/MYLANTA) 200-200-20 MG/5ML suspension 30 mL (has no administration in time range)  ondansetron (ZOFRAN) tablet 4 mg (has no administration in time range)  levothyroxine (SYNTHROID, LEVOTHROID) tablet 125 mcg (125 mcg Oral Given 10/04/18 6045)     Initial Impression / Assessment and Plan / ED Course  I have reviewed the triage vital signs and the nursing notes.  Pertinent labs & imaging results that were available during my care of the patient were reviewed by me and considered in my medical decision making (see chart for details).   Pt's sx are likely due to progression of dementia.  Her paranoia is getting much worse.  Her TSH is quite elevated, so it is likely she is not taking any of her meds.  I will put in a TTS consult to get their input.   Final Clinical Impressions(s) / ED Diagnoses   Final diagnoses:  Hypothyroidism, unspecified type  Dementia with behavioral disturbance, unspecified dementia type (HCC)  Noncompliance with medications    ED Discharge Orders    None       Jacalyn Lefevre, MD 10/04/18 [phone]

## 2018-10-03 NOTE — Telephone Encounter (Signed)
Noted  

## 2018-10-04 ENCOUNTER — Encounter (HOSPITAL_COMMUNITY): Payer: Self-pay | Admitting: Registered Nurse

## 2018-10-04 NOTE — Progress Notes (Signed)
CSW aware of consult. CSW spoke with daughter, Philipp Deputy [phone], regarding additional resources for patient. CSW aware patient is pending follow up from TTS. Per Ibisi, patient's dementia has been getting progressively worse. Ibisi reports that patient is not violent but has been becoming more forgetful and has been showing signs of wandering. Per Ibisi, patient lives alone but she and her husband only live about 800 feet away. Per Ibisi, patient's neighbors keep a look out for her and they have all noticed she is getting worse. Per Ibisi, they are looking into getting guardianship of patient but as of now, patient is her own legal guardian. CSW offered patient's daughter resources for placement once patient is discharged home to which patient's daughter declined saying they were not ready for that. Patient's daughter would like home aid resources. CSW to refer patient's daughter to [facility] [facility] [place]. CSW also aware patient is not currently being followed by a psychiatrist. CSW to provide a list of providers that specialize in geriatric patients. Patient's daughter inquired about when patient would be discharged. CSW explained that patient still needed to be seen by TTS but would follow up after that happened.   Archie Balboa, LCSWA  Clinical Social Work Department  [employer]  [phone]

## 2018-10-04 NOTE — ED Notes (Addendum)
Pt.s daughter arrived to transport pt. Home.  Reviewed with pt. 's daughter discharge instructions. She verbalized understanding of discharge instructions.

## 2018-10-04 NOTE — BH Assessment (Addendum)
Tele Assessment Note   Patient Name: Sabrina Hart MRN: [id number] Referring Physician: Dr. Jacalyn Lefevre, MD Location of Patient: [facility] Location of Provider: Behavioral Health TTS Department  [facility] is an 84 y.o. female who was brought to [facility] ED by [facility] due to concerns that she was experiencing difficulties with her dementia because she was found walking up and down her street and appeared to be confused. Pt shares that she has begun a new exercise regimen that has her walking up and down her street in an effort to get healthy, though pt's daughter, who pt allowed clinician to provide information to and from, doubts this. Pt's daughter also reported that pt has called her claiming that there have been people in her home folding clothes and that she saw her daughter in her home going through her pocketbook. Pt's daughter shares they put up a security system with cameras and that her mother wants to watch it every day to ensure that no one has entered the home.  Pt states she believes she is at the hospital because "people think I'm crazy" and because "when I'm in one of my moods I'm not gentle." Pt was cooperative throughout this assessment, though she had difficulties hearing, which required repeating the assessment questions numerous times and sometimes the assistance of her daughter to repeat them to her as well.  Pt denies SI, HI, AVH, and NSSIB. When pt's daughter noted that her mother had discussed the VH, pt stated that she didn't remember saying those things, though she stated those incidents could have happened. Pt's daughter shared her mother was dx with dementia in January 2019. Pt lives independently in her own home and has no assistance such as home health aides, though pt's daughter states she lives within 800 ft of pt.  Pt has never had a therapist nor a psychiatrist. Pt denies ever having experienced depression. Pt's daughter states pt's mother  experienced post-partum depression and was put in a sanitarium and stayed there for the rest of her life, which is terrifying for pt to think about. Pt's daughter wants to ensure that her mother is never put in a place that would ever make her mother think she was being dropped off and forgotten or that was anything like a mental health facility due to her mother's fear of that happening to her.  Pt was oriented x4; she needed some time to think about the date but was correct after she thought about it for a moment. Her recent and remote memory appeared to be intact. Pt was pleasant and cooperative throughout the assessment process and was extremely talkative; pt's daughter had to re-direct her several times, as pt couldn't hear clinician trying to ask additional questions over her talking. Pt's insight, judgement, and impulse control is impaired at this time.   Diagnosis: F43.25, Adjustment disorder, With mixed disturbance of emotions and conduct; F02.80 Major  neurocognitive disorder due to another medical condition, Without behavioral disturbance   Past Medical History:  Past Medical History:  Diagnosis Date  . Anginal pain (HCC)    1990's; 09/28/2012  . Arthritis    "very mild" (09/28/2012)  . Coronary artery disease 09/27/2012   s/p PCI of the RCA with DES with remote PCI in [place] 15 to 20 years ago. 2. cath 02/2013 patent mid RCA stent, 30% left main, 70% mid to distaal tanden 70% lesions x 3, 60% mid diag, 50% ostial LCx, 40% mid LCx.   Marland Kitchen  Glaucoma   . Hyperlipidemia   . Hypothyroidism     Past Surgical History:  Procedure Laterality Date  . CARDIAC CATHETERIZATION  1990s   PCI  . CATARACT EXTRACTION W/ INTRAOCULAR LENS  IMPLANT, BILATERAL  1970's  . CORONARY ANGIOPLASTY WITH STENT PLACEMENT  09/28/2012   20-30% diffuse LAD stenosis, mild-mod D1 dz, 70% distal D1 lesion, mild diffuse LCx dz, 50-60% OM1 stenosis, 60% mid & 90% mid-dis RCA stenosis s/p DES; LVEF 75-80% w/  hyperdynamic fxn.   . DENTAL SURGERY     "implants"  . INNER EAR SURGERY  ~ 2010   "put plugs in so I could fly" (09/28/2012)  . LEFT HEART CATHETERIZATION WITH CORONARY ANGIOGRAM N/A 09/28/2012   Procedure: LEFT HEART CATHETERIZATION WITH CORONARY ANGIOGRAM;  Surgeon: [name], MD;  Location: [facility] CATH LAB;  Service: Cardiovascular;  Laterality: N/A;  . LUMBAR DISC SURGERY  1980's   "took out a little piece" (09/28/2012)  . PERCUTANEOUS CORONARY STENT INTERVENTION (PCI-S)  09/28/2012   Procedure: PERCUTANEOUS CORONARY STENT INTERVENTION (PCI-S);  Surgeon: [name], MD;  Location: [facility] CATH LAB;  Service: Cardiovascular;;  . REDUCTION MAMMAPLASTY  1970's    Family History: No family history on file.  Social History:  reports that she has never smoked. She has never used smokeless tobacco. She reports that she drinks about 1.0 standard drinks of alcohol per week. She reports that she does not use drugs.  Additional Social History:  Alcohol / Drug Use Pain Medications: Please see MAR Prescriptions: Please see MAR Over the Counter: Please see MAR History of alcohol / drug use?: No history of alcohol / drug abuse Longest period of sobriety (when/how long): N/A  CIWA: CIWA-Ar BP: (!) 178/116 Pulse Rate: 94 COWS:    Allergies:  Allergies  Allergen Reactions  . Other Other (See Comments)    Metals; "skin gets weepy; gets worse if it makes contact" (09/28/2012)  . Pollen Extract Other (See Comments)    "sneezing; watery eyes"  . Latex Itching and Swelling    Skin swells  . Morphine And Related Rash  . Nickel Other (See Comments)    Causes weepiness (skin)  . Penicillins Rash    Has patient had a PCN reaction causing immediate rash, facial/tongue/throat swelling, SOB or lightheadedness with hypotension: Yes Has patient had a PCN reaction causing severe rash involving mucus membranes or skin necrosis: Unk Has patient had a PCN reaction that required hospitalization: No   Has patient had a PCN reaction occurring within the last 10 years: No If all of the above answers are "NO", then may proceed with Cephalosporin use.     Home Medications:  (Not in a hospital admission)  OB/GYN Status:  No LMP recorded. Patient is postmenopausal.  General Assessment Data Location of Assessment: [facility] ED TTS Assessment: In system Is this a Tele or Face-to-Face Assessment?: Tele Assessment Is this an Initial Assessment or a Re-assessment for this encounter?: Initial Assessment Patient Accompanied by:: Adult(Pt's daughter was present for pt's assessment w/ her consent) Permission Given to speak with another: Yes Name, Relationship and Phone Number: Bidi Richardean Sale) Clarise Cruz, daughter Language Other than English: No Living Arrangements: Other (Comment)(Pt lives independently in her own home) What gender do you identify as?: Female Marital status: Single Maiden name: Pais Pregnancy Status: No Living Arrangements: Alone Can pt return to current living arrangement?: Yes Admission Status: Voluntary Is patient capable of signing voluntary admission?: Yes Referral Source: Self/Family/Friend Insurance type: [employer]  Crisis Care Plan Living Arrangements: Alone Legal Guardian: (N/A) Name of Psychiatrist: N/A Name of Therapist: N/A  Education Status Is patient currently in school?: No Is the patient employed, unemployed or receiving disability?: Receiving disability income  Risk to self with the past 6 months Suicidal Ideation: No Has patient been a risk to self within the past 6 months prior to admission? : No Suicidal Intent: No Has patient had any suicidal intent within the past 6 months prior to admission? : No Is patient at risk for suicide?: No Suicidal Plan?: No Has patient had any suicidal plan within the past 6 months prior to admission? : No Access to Means: No What has been your use of drugs/alcohol within the last 12 months?: None  reported Previous Attempts/Gestures: No How many times?: 0 Other Self Harm Risks: None noted Triggers for Past Attempts: None known Intentional Self Injurious Behavior: None Family Suicide History: No Recent stressful life event(s): Recent negative physical changes(Pt was diagnosed with dementia in January 2019) Persecutory voices/beliefs?: No Depression: No Substance abuse history and/or treatment for substance abuse?: No Suicide prevention information given to non-admitted patients: Not applicable  Risk to Others within the past 6 months Homicidal Ideation: No Does patient have any lifetime risk of violence toward others beyond the six months prior to admission? : No Thoughts of Harm to Others: No Current Homicidal Intent: No Current Homicidal Plan: No Access to Homicidal Means: No Identified Victim: None noted History of harm to others?: No Assessment of Violence: On admission Violent Behavior Description: None noted Does patient have access to weapons?: No(Pt and her daughter deny) Criminal Charges Pending?: No Does patient have a court date: No Is patient on probation?: No  Psychosis Hallucinations: Visual(Pt reports seeing people in her home) Delusions: None noted  Mental Status Report Appearance/Hygiene: In scrubs Eye Contact: Good Motor Activity: Shuffling, Unremarkable Speech: Logical/coherent Level of Consciousness: Alert Mood: Pleasant Affect: Appropriate to circumstance Anxiety Level: None Thought Processes: Coherent, Relevant Judgement: Partial Orientation: Person, Place, Time, Situation Obsessive Compulsive Thoughts/Behaviors: Moderate  Cognitive Functioning Concentration: Fair Memory: Recent Intact, Remote Intact Is patient IDD: No Insight: Fair Impulse Control: Poor Appetite: Good Have you had any weight changes? : No Change Sleep: No Change Total Hours of Sleep: 8 Vegetative Symptoms: None  ADLScreening [facility] Assessment Services) Patient's  cognitive ability adequate to safely complete daily activities?: No Patient able to express need for assistance with ADLs?: Yes Independently performs ADLs?: Yes (appropriate for developmental age)  Prior Inpatient Therapy Prior Inpatient Therapy: No  Prior Outpatient Therapy Prior Outpatient Therapy: No Does patient have an ACCT team?: No Does patient have Intensive In-House Services?  : No Does patient have [facility] services? : No Does patient have P4CC services?: No  ADL Screening (condition at time of admission) Patient's cognitive ability adequate to safely complete daily activities?: No Is the patient deaf or have difficulty hearing?: Yes Does the patient have difficulty seeing, even when wearing glasses/contacts?: No Does the patient have difficulty concentrating, remembering, or making decisions?: Yes Patient able to express need for assistance with ADLs?: Yes Does the patient have difficulty dressing or bathing?: No Independently performs ADLs?: Yes (appropriate for developmental age) Does the patient have difficulty walking or climbing stairs?: No Weakness of Legs: None Weakness of Arms/Hands: None     Therapy Consults (therapy consults require a physician order) PT Evaluation Needed: No OT Evalulation Needed: No SLP Evaluation Needed: No Abuse/Neglect Assessment (Assessment to be complete while patient is alone) Abuse/Neglect Assessment  Can Be Completed: Unable to assess, patient is non-responsive or altered mental status Values / Beliefs Cultural Requests During Hospitalization: None Spiritual Requests During Hospitalization: None Consults Spiritual Care Consult Needed: No Social Work Consult Needed: No Merchant navy officer (For Healthcare) Does Patient Have a Medical Advance Directive?: No Would patient like information on creating a medical advance directive?: No - Patient declined       Disposition: Nira Conn NP reviewed pt's chart and information and  determined pt should be observed overnight for safety and stability to ensure safety measures can be put into place prior to pt's discharge; a SW consult has been requested to assist pt and pt's daughter to look into assisted living or home health aides. This plan was verified with Dr. Bebe Shaggy, MD. This information was explained to pt's daughter, who expressed an understanding.  Disposition Initial Assessment Completed for this Encounter: Yes Patient referred to: Social Work(Pt will be observed overnight for safety and stability)  This service was provided via telemedicine using a 2-way, interactive audio and Immunologist.  Names of all persons participating in this telemedicine service and their role in this encounter. Name: Janean Sark Role: Patient  Name: Bisi Richardean Sale) Clarise Cruz Role: Patient's Daughter  Name: Duard Brady Role: Clinician    Ralph Dowdy 10/04/2018 1:34 AM

## 2018-10-04 NOTE — ED Provider Notes (Signed)
Received care of patient at 7 AM.  Please see previous notes for history, physical and care.  Briefly this is an 83 year old female who presented with concern for worsening dementia and paranoid thoughts.  Her TSH is elevated, however has been similarly in the past, there is suspicion that she has not been taking medications appropriately.  She was medically cleared, and TTS was consulted.  Patient has been psychiatrically cleared by behavioral health at time of my evaluation.  She is well-appearing, without acute concerns.  CSW discussed with daughter, referred to [facility].    Alvira Monday, MD 10/04/18 [phone]

## 2018-10-04 NOTE — Discharge Instructions (Addendum)
For your Behavioral Health Needs, you are advised to follow up with one of the following providers.  Please contact at your earliest convenience.  Neila Gear, MD [facility] [address] [place], [place] [address] [phone]  [facility] [address], [address] [place], [place] [address] [phone]  [facility]. [employer]. [address] [place], [place] [address] [phone]  [facility] [address] [place], [place] [address] [phone]

## 2018-10-04 NOTE — ED Notes (Signed)
TTS in process; patient's daughter present for TTS-Monique,RN

## 2018-10-04 NOTE — ED Notes (Signed)
Pt. Ate 100% of lunch, ambulates to the bathroom with stand by assist.

## 2018-10-04 NOTE — Consult Note (Signed)
Tele Assessment   Sabrina Hart, 84 y.o., female patient with history of dementia presented to [facility] via EMS after she was found walking up and down her street and appeared confused .  Prior to ED visit; daughter of patient called reporting that patient walked to neighbors and wouldn't come home because people were watching her; patient stating she was going to walk to bank to ger her money.    Patient seen via telepsych by this provider; chart reviewed and consulted with Dr. Lucianne Muss on 10/04/18.    Results for orders placed or performed during the hospital encounter of 10/03/18 (from the past 24 hour(s))  Comprehensive metabolic panel     Status: Abnormal   Collection Time: 10/03/18  4:15 PM  Result Value Ref Range   Sodium 134 (L) 135 - 145 mmol/L   Potassium 3.4 (L) 3.5 - 5.1 mmol/L   Chloride 96 (L) 98 - 111 mmol/L   CO2 28 22 - 32 mmol/L   Glucose, Bld 94 70 - 99 mg/dL   BUN 11 8 - 23 mg/dL   Creatinine, Ser 1.61 (H) 0.44 - 1.00 mg/dL   Calcium 9.3 8.9 - 09.6 mg/dL   Total Protein 6.8 6.5 - 8.1 g/dL   Albumin 4.0 3.5 - 5.0 g/dL   AST 84 (H) 15 - 41 U/L   ALT 32 0 - 44 U/L   Alkaline Phosphatase 64 38 - 126 U/L   Total Bilirubin 1.3 (H) 0.3 - 1.2 mg/dL   GFR calc non Af Amer 41 (L) >60 mL/min   GFR calc Af Amer 47 (L) >60 mL/min   Anion gap 10 5 - 15  Ethanol     Status: None   Collection Time: 10/03/18  4:15 PM  Result Value Ref Range   Alcohol, Ethyl (B) <10 <10 mg/dL  CBC with Diff     Status: Abnormal   Collection Time: 10/03/18  4:15 PM  Result Value Ref Range   WBC 3.8 (L) 4.0 - 10.5 K/uL   RBC 4.24 3.87 - 5.11 MIL/uL   Hemoglobin 13.6 12.0 - 15.0 g/dL   HCT 04.5 40.9 - 81.1 %   MCV 94.8 80.0 - 100.0 fL   MCH 32.1 26.0 - 34.0 pg   MCHC 33.8 30.0 - 36.0 g/dL   RDW 91.4 78.2 - 95.6 %   Platelets 257 150 - 400 K/uL   nRBC 0.0 0.0 - 0.2 %   Neutrophils Relative % 60 %   Neutro Abs 2.3 1.7 - 7.7 K/uL   Lymphocytes Relative 25 %   Lymphs Abs 0.9 0.7 - 4.0 K/uL   Monocytes Relative 9 %   Monocytes Absolute 0.3 0.1 - 1.0 K/uL   Eosinophils Relative 4 %   Eosinophils Absolute 0.2 0.0 - 0.5 K/uL   Basophils Relative 1 %   Basophils Absolute 0.0 0.0 - 0.1 K/uL   Immature Granulocytes 1 %   Abs Immature Granulocytes 0.03 0.00 - 0.07 K/uL  TSH     Status: Abnormal   Collection Time: 10/03/18  4:15 PM  Result Value Ref Range   TSH 118.080 (H) 0.350 - 4.500 uIU/mL  Urine rapid drug screen (hosp performed)     Status: None   Collection Time: 10/03/18  8:50 PM  Result Value Ref Range   Opiates NONE DETECTED NONE DETECTED   Cocaine NONE DETECTED NONE DETECTED   Benzodiazepines NONE DETECTED NONE DETECTED   Amphetamines NONE DETECTED NONE DETECTED   Tetrahydrocannabinol NONE DETECTED NONE  DETECTED   Barbiturates NONE DETECTED NONE DETECTED  Urinalysis, Routine w reflex microscopic     Status: Abnormal   Collection Time: 10/03/18  8:50 PM  Result Value Ref Range   Color, Urine YELLOW YELLOW   APPearance CLEAR CLEAR   Specific Gravity, Urine 1.012 1.005 - 1.030   pH 7.0 5.0 - 8.0   Glucose, UA NEGATIVE NEGATIVE mg/dL   Hgb urine dipstick SMALL (A) NEGATIVE   Bilirubin Urine NEGATIVE NEGATIVE   Ketones, ur 5 (A) NEGATIVE mg/dL   Protein, ur NEGATIVE NEGATIVE mg/dL   Nitrite NEGATIVE NEGATIVE   Leukocytes, UA NEGATIVE NEGATIVE   RBC / HPF 0-5 0 - 5 RBC/hpf   WBC, UA 0-5 0 - 5 WBC/hpf   Bacteria, UA RARE (A) NONE SEEN   Squamous Epithelial / LPF 0-5 0 - 5   Mucus PRESENT      On evaluation Sabrina Hart reports that she is at [facility] "but I don't know which department."  States that she is not really sure why she was brought to the hospital; but through "the grape vine; some people where thinking I am crazy or was running away.  I don't know how [place] figure you can [place] there is no where to go but into the woods or to the mall."  Patient states that she has been living in the same neighborhood for over 30 yrs.   "As long as I been living there I have never went walking.  I usually go to the gym and meet some friends and we walk; but that gets monotonous.  I had seen several of my neighbors walk around the lane; so I said let me give it a try.  The first day that I went walking I was going to walk to the mall but one of my neighbors passed; stopped and asked where I was going."  Patient states that her neighbor took her to get breakfast, got her nails done at the mall, and she ordered grocery; states that neighbor called her daughter who eventually came to the mall and ended up taking her back home when she was finished.  Patient states she rested the next day but the 3rd day she said she would try the walking thing again.  "On the 3rd day I figured I would walk the other end of the lane.  You see on one side of lane more blacks live; on the other white.  The first time I walked it was on the black side but on the next walk it was on the white side.  This lady came out and asked if I would like to join her for a coupe of coffee; very nice; but next thing I know I was getting into the EMS wagon and brought here to the hospital."  Patient states that she does not want to harm or kill herself; patient also denies homicidal ideation, psychosis, and paranoia.  Patient states that she lives alone; that she is still driving and she is able to care for herself at home.  States that her daughter has to pass her house to get to her on.  "My daughter can be a little dramatic and she is a control freak.  She means well; referring to her daughters concern over her.  Patient states that she may have felt a little apprehensive first by riding with one neighbor and then by going into another neighbors house "but I figured they  were my neighbors so what the heck."  Patient also stated "people may think I'm crazy cause I talk to myself sometimes; but I live alone so I may say some things out loud that I'm thinking; let me give you an example.   I may yell for the cat to come in and eat; but if he doesn't come when I call I may say out loud; I'm not gonna run you down to eat; or I might say Oh I better get up so I can do..; or I sure do like what the painter did to that...; stuff like that.  But I don't hear voices."  During evaluation Sabrina Hart is alert/oriented x 4; She was abe to give date of birth, name of president; month, year, season,  calm/cooperative; and mood congruent with affect.  She does not appear to be responding to internal/external stimuli or delusional thoughts.  Patient denied suicidal/self-harm/homicidal ideation, psychosis, and paranoia.  Patient answered question appropriately.  Recommendations:  Patient psychiatric cleared.  SW will call daughter to pick up.  Follow up with PCP related to TSH.   Disposition: Psychiatrically cleared. No evidence of imminent risk to self or others at present.   Patient does not meet criteria for psychiatric inpatient admission. Supportive therapy provided about ongoing stressors. Discussed crisis plan, support from social network, calling 911, coming to the Emergency Department, and calling Suicide Hotline.  Spoke with Dr. Jacqulyn Bath unavailable at this time.  Spoke with Casimiro Needle, [place]; informed that notes of all patient seen at [facility] in and can have Dr. Jacqulyn Bath review consult notes.   SW will inform RN of recommendation and dispositions.

## 2018-10-10 IMAGING — DX DG THORACIC SPINE 2V
2 series · 2 of 2 positions shown · non-contrast
Comparison: Radiographs September 27, 2012.

CLINICAL DATA: Chronic upper back pain without known injury.

EXAM:
THORACIC SPINE 2 VIEWS

[t-spine ap]
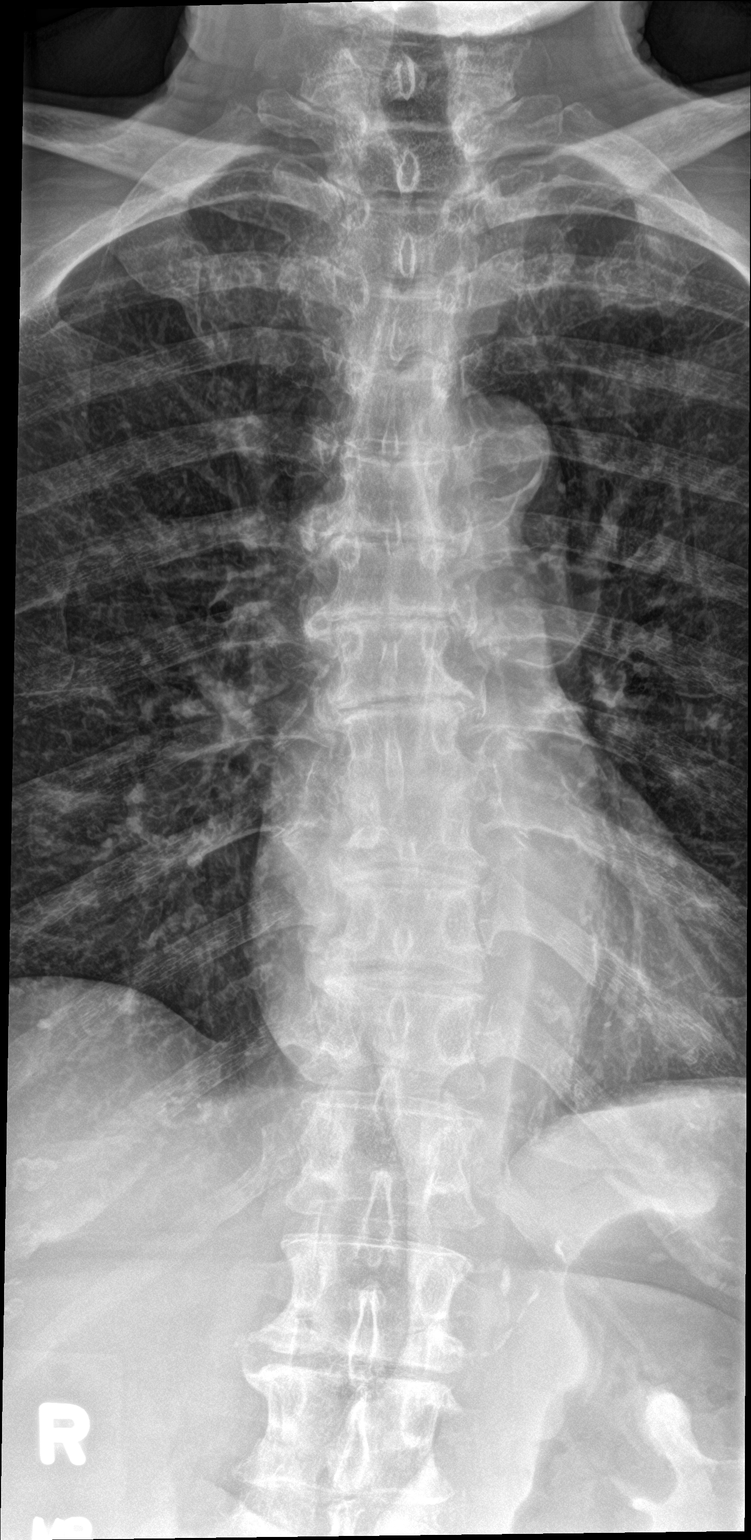

[t-spine lat]
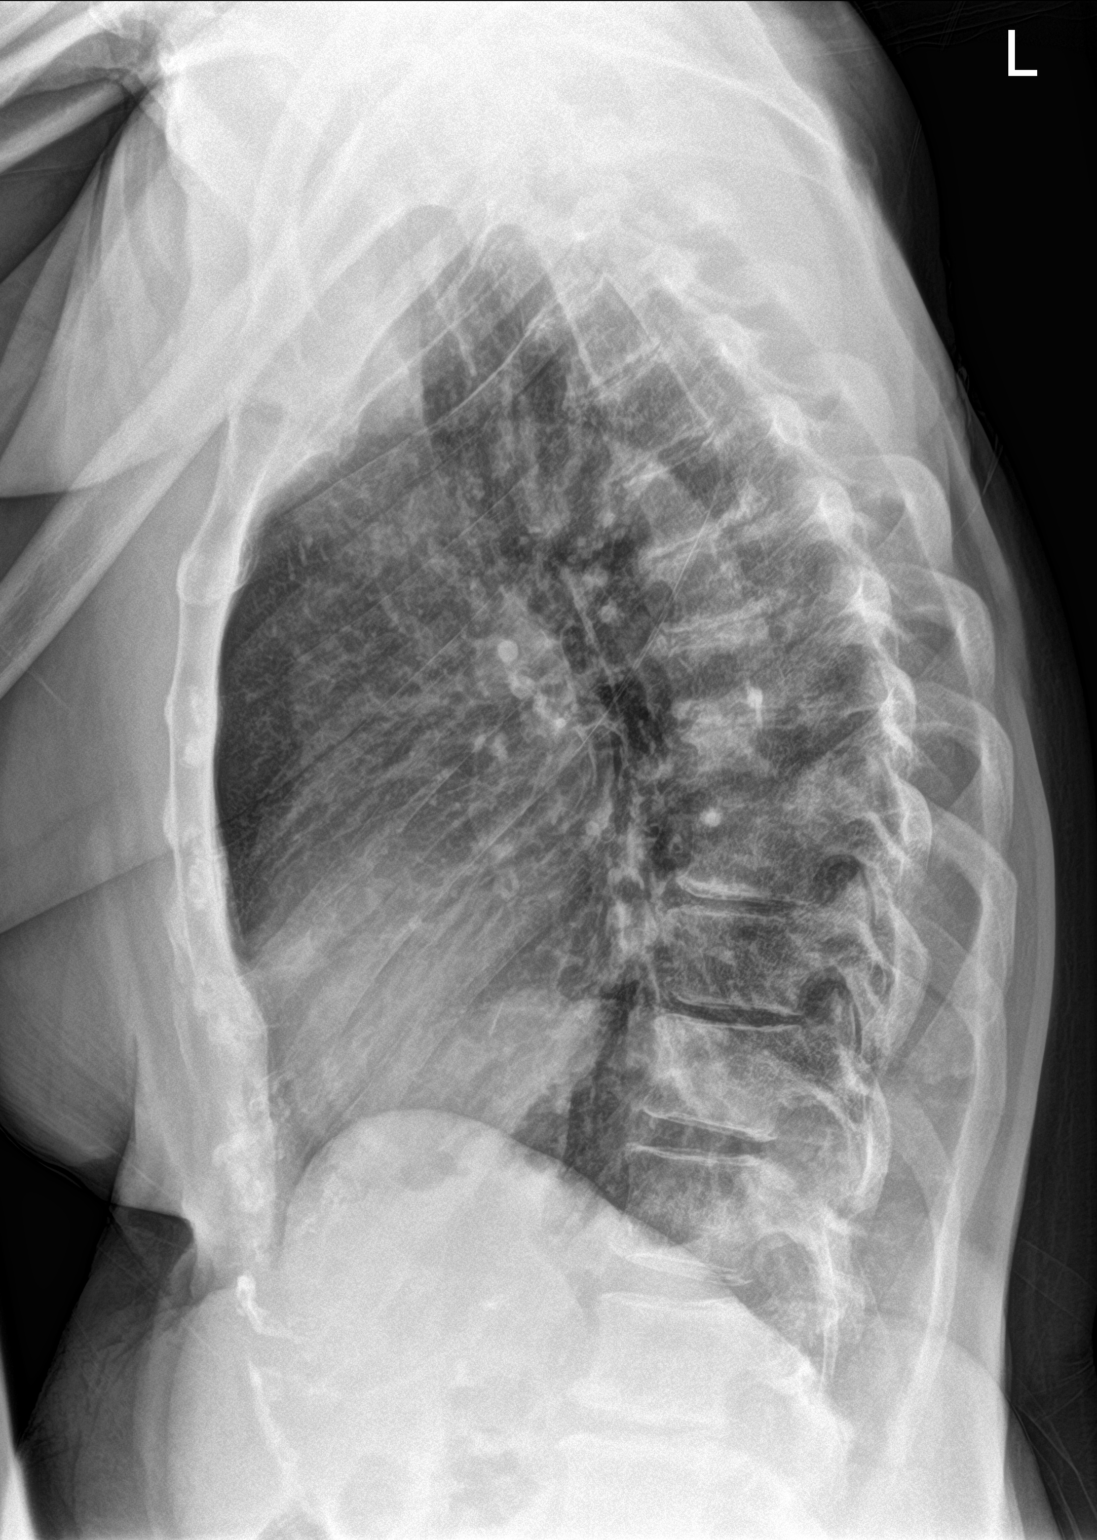

[2 of 2 positions shown; findings below may reference images not displayed]

FINDINGS: Mild S-shaped scoliosis of thoracic spine is noted. No fracture or
spondylolisthesis is noted. Multilevel degenerative disc disease is
noted in the mid thoracic spine.
IMPRESSION: Multilevel degenerative disc disease. No acute abnormality seen in
the thoracic spine.

## 2018-10-31 ENCOUNTER — Encounter: Payer: Self-pay | Admitting: Family Medicine

## 2018-10-31 ENCOUNTER — Ambulatory Visit (INDEPENDENT_AMBULATORY_CARE_PROVIDER_SITE_OTHER): Payer: Medicare Other | Admitting: Family Medicine

## 2018-10-31 VITALS — BP 132/84 | HR 70 | Temp 97.6°F | Wt 126.0 lb

## 2018-10-31 DIAGNOSIS — F0390 Unspecified dementia without behavioral disturbance: Secondary | ICD-10-CM

## 2018-10-31 DIAGNOSIS — H9193 Unspecified hearing loss, bilateral: Secondary | ICD-10-CM

## 2018-10-31 DIAGNOSIS — H66001 Acute suppurative otitis media without spontaneous rupture of ear drum, right ear: Secondary | ICD-10-CM | POA: Diagnosis not present

## 2018-10-31 DIAGNOSIS — E039 Hypothyroidism, unspecified: Secondary | ICD-10-CM | POA: Diagnosis not present

## 2018-10-31 MED ORDER — CEFDINIR 300 MG PO CAPS: 300.0000 mg | ORAL_CAPSULE | Freq: Two times a day (BID) | ORAL | 0 refills | Status: AC

## 2018-10-31 NOTE — Progress Notes (Signed)
Subjective:    Patient ID: Sabrina Hart, female    DOB: 09/24/1932, 84 y.o.   MRN: [id number]  No chief complaint on file.   HPI Patient was seen today for f/u on chronic conditions.  She is accompanied by her daughter.  Pt was seen in ED on 10/03/18 for worsening dementia and paranoid thoughts.  She was seen by Psych and cleared.  Pt's daughter is now more active in pt's care.  She is helping pt with finances and working to obtain POA, etc.  Hearing difficulty: -Per pt's daughter hearing recently checked. -Pt recommended to get hearing aids -pt not excited about this. -has difficulty hearing the conversation at this visit.  Dementia/memory concerns: -ongoing issue -CT head 01/17/18 with age related changes -in the past pt paranoid thinking chemical were being pumped into her home. -also forgetting to take medications -Pt's daughter and grandson will be helping her with medications. -Pt does not want HH to help her with meds.  Hypothyroidism: -in the past pt stopped meds "to see if I needed it" -states will start taking synthroid 125 mcg -denies hair loss, palpitations, constipation, diarrhea, heat or cold intolerance   Past Medical History:  Diagnosis Date  . Anginal pain (HCC)    1990's; 09/28/2012  . Arthritis    "very mild" (09/28/2012)  . Coronary artery disease 09/27/2012   s/p PCI of the RCA with DES with remote PCI in [place] 15 to 20 years ago. 2. cath 02/2013 patent mid RCA stent, 30% left main, 70% mid to distaal tanden 70% lesions x 3, 60% mid diag, 50% ostial LCx, 40% mid LCx.   . Glaucoma   . Hyperlipidemia   . Hypothyroidism     Allergies  Allergen Reactions  . Other Other (See Comments)    Metals; "skin gets weepy; gets worse if it makes contact" (09/28/2012)  . Pollen Extract Other (See Comments)    "sneezing; watery eyes"  . Latex Itching and Swelling    Skin swells  . Morphine And Related Rash  . Nickel Other (See Comments)    Causes  weepiness (skin)  . Penicillins Rash    Has patient had a PCN reaction causing immediate rash, facial/tongue/throat swelling, SOB or lightheadedness with hypotension: Yes Has patient had a PCN reaction causing severe rash involving mucus membranes or skin necrosis: Unk Has patient had a PCN reaction that required hospitalization: No  Has patient had a PCN reaction occurring within the last 10 years: No If all of the above answers are "NO", then may proceed with Cephalosporin use.     ROS General: Denies fever, chills, night sweats, changes in weight, changes in appetite  +dementia HEENT: Denies headaches, ear pain, changes in vision, rhinorrhea, sore throat  + hearing difficulties CV: Denies CP, palpitations, SOB, orthopnea Pulm: Denies SOB, cough, wheezing GI: Denies abdominal pain, nausea, vomiting, diarrhea, constipation GU: Denies dysuria, hematuria, frequency, vaginal discharge Msk: Denies muscle cramps, joint pains Neuro: Denies weakness, numbness, tingling Skin: Denies rashes, bruising Psych: Denies depression, anxiety, hallucinations    Objective:    Blood pressure 132/84, pulse 70, temperature 97.6 F (36.4 C), temperature source Oral, weight 126 lb (57.2 kg), SpO2 94 %.   Gen. Pleasant, well-nourished, in no distress, normal affect, difficulty hearing HEENT: [place]/AT, face symmetric, conjunctiva clear, no scleral icterus, proptosis PERRLA, nares patent without drainage, pharynx without erythema or exudate.  Left TM normal.  Right TM full, erythematous, with purulent debris noted.  No thyromegaly Lungs: no accessory  muscle use, CTAB, no wheezes or rales Cardiovascular: RRR, no m/r/g, mild peripheral edema, L foot >R foot Neuro:  A&Ox3, CN II-XII intact, normal gait  Wt Readings from Last 3 Encounters:  10/31/18 126 lb (57.2 kg)  07/21/18 131 lb (59.4 kg)  07/07/18 131 lb (59.4 kg)    Lab Results  Component Value Date   WBC 3.8 (L) 10/03/2018   HGB 13.6 10/03/2018    HCT 40.2 10/03/2018   PLT 257 10/03/2018   GLUCOSE 94 10/03/2018   CHOL 192 11/12/2016   TRIG 46 11/12/2016   HDL 101 11/12/2016   LDLDIRECT 127.7 01/14/2014   LDLCALC 82 11/12/2016   ALT 32 10/03/2018   AST 84 (H) 10/03/2018   NA 134 (L) 10/03/2018   K 3.4 (L) 10/03/2018   CL 96 (L) 10/03/2018   CREATININE 1.19 (H) 10/03/2018   BUN 11 10/03/2018   CO2 28 10/03/2018   TSH 118.080 (H) 10/03/2018   INR 1.0 03/02/2013   HGBA1C 5.6 01/16/2018    Assessment/Plan:  Dementia without behavioral disturbance, unspecified dementia type (HCC)  -Discussed having home health or family monitor patient's medications.  Patient is currently opposed to home health. -CT head without contrast 01/17/2018 normal. -We will obtain neuropsych testing. - Plan: Ambulatory referral to Neurology -Patient's daughter given information on advance directives, HCPOA, POA. -[facility]  Hypothyroidism, unspecified type -Discussed the importance of taking Synthroid 125 mcg every morning -Once patient consistently taking medication can repeat TSH in 6 weeks. -Given handout  Acute suppurative otitis media of right ear without spontaneous rupture of tympanic membrane, recurrence not specified  -Penicillin allergy-causes hives -Given handout - Plan: cefdinir (OMNICEF) 300 MG capsule  Decreased hearing of both ears -Discussed hearing aids would help -Discussed current infection also contributing factor  Follow-up in 1 month, sooner if needed to further review patient's chronic medical conditions and ensure compliance with medications.  Abbe AmsterdamShannon Ovidio Steele, MD

## 2018-10-31 NOTE — Patient Instructions (Signed)
Otitis Media, Adult Otitis media is redness, soreness, and puffiness (swelling) in the space just behind your eardrum (middle ear). It may be caused by allergies or infection. It often happens along with a cold. Follow these instructions at home:  Take your medicine as told. Finish it even if you start to feel better.  Only take over-the-counter or prescription medicines for pain, discomfort, or fever as told by your doctor.  Follow up with your doctor as told. Contact a doctor if:  You have otitis media only in one ear, or bleeding from your nose, or both.  You notice a lump on your neck.  You are not getting better in 3-5 days.  You feel worse instead of better. Get help right away if:  You have pain that is not helped with medicine.  You have puffiness, redness, or pain around your ear.  You get a stiff neck.  You cannot move part of your face (paralysis).  You notice that the bone behind your ear hurts when you touch it. This information is not intended to replace advice given to you by your health care provider. Make sure you discuss any questions you have with your health care provider. Document Released: 05/03/2008 Document Revised: 04/22/2016 Document Reviewed: 06/12/2013 Elsevier Interactive Patient Education  2017 Elsevier Inc.  Hypothyroidism Hypothyroidism is a disorder of the thyroid. The thyroid is a large gland that is located in the lower front of the neck. The thyroid releases hormones that control how the body works. With hypothyroidism, the thyroid does not make enough of these hormones. What are the causes? Causes of hypothyroidism may include:  Viral infections.  Pregnancy.  Your own defense system (immune system) attacking your thyroid.  Certain medicines.  Birth defects.  Past radiation treatments to your head or neck.  Past treatment with radioactive iodine.  Past surgical removal of part or all of your thyroid.  Problems with the gland  that is located in the center of your brain (pituitary).  What are the signs or symptoms? Signs and symptoms of hypothyroidism may include:  Feeling as though you have no energy (lethargy).  Inability to tolerate cold.  Weight gain that is not explained by a change in diet or exercise habits.  Dry skin.  Coarse hair.  Menstrual irregularity.  Slowing of thought processes.  Constipation.  Sadness or depression.  How is this diagnosed? Your health care provider may diagnose hypothyroidism with blood tests and ultrasound tests. How is this treated? Hypothyroidism is treated with medicine that replaces the hormones that your body does not make. After you begin treatment, it may take several weeks for symptoms to go away. Follow these instructions at home:  Take medicines only as directed by your health care provider.  If you start taking any new medicines, tell your health care provider.  Keep all follow-up visits as directed by your health care provider. This is important. As your condition improves, your dosage needs may change. You will need to have blood tests regularly so that your health care provider can watch your condition. Contact a health care provider if:  Your symptoms do not get better with treatment.  You are taking thyroid replacement medicine and: ? You sweat excessively. ? You have tremors. ? You feel anxious. ? You lose weight rapidly. ? You cannot tolerate heat. ? You have emotional swings. ? You have diarrhea. ? You feel weak. Get help right away if:  You develop chest pain.  You develop an irregular  heartbeat.  You develop a rapid heartbeat. This information is not intended to replace advice given to you by your health care provider. Make sure you discuss any questions you have with your health care provider. Document Released: 11/15/2005 Document Revised: 04/22/2016 Document Reviewed: 04/02/2014 Elsevier Interactive Patient Education  2018  [employer]

## 2018-11-02 ENCOUNTER — Other Ambulatory Visit: Payer: Self-pay | Admitting: Family Medicine

## 2018-11-02 DIAGNOSIS — E039 Hypothyroidism, unspecified: Secondary | ICD-10-CM

## 2018-11-02 NOTE — Telephone Encounter (Signed)
Copied from CRM [phone]. Topic: Quick Communication - Rx Refill/Question >> Nov 02, 2018 10:16 AM Arlyss Gandyichardson, Hilliary Jock N, NT wrote: Medication: levothyroxine (SYNTHROID, LEVOTHROID) 125 MCG tablet   Has the patient contacted their pharmacy? Yes.   (Agent: If no, request that the patient contact the pharmacy for the refill.) (Agent: If yes, when and what did the pharmacy advise?)  Preferred Pharmacy (with phone number or street name): [id number] [place], [place] [facility] [address] [phone] (Phone) [phone] (Fax)    Agent: Please be advised that RX refills may take up to 3 business days. We ask that you follow-up with your pharmacy.

## 2018-12-12 ENCOUNTER — Ambulatory Visit: Payer: Medicare Other | Admitting: Family Medicine

## 2018-12-13 ENCOUNTER — Ambulatory Visit (INDEPENDENT_AMBULATORY_CARE_PROVIDER_SITE_OTHER): Payer: Medicare Other | Admitting: Family Medicine

## 2018-12-13 ENCOUNTER — Encounter: Payer: Self-pay | Admitting: Family Medicine

## 2018-12-13 VITALS — BP 128/68 | HR 78 | Temp 98.4°F | Wt 126.0 lb

## 2018-12-13 DIAGNOSIS — R2689 Other abnormalities of gait and mobility: Secondary | ICD-10-CM | POA: Diagnosis not present

## 2018-12-13 DIAGNOSIS — R600 Localized edema: Secondary | ICD-10-CM

## 2018-12-13 DIAGNOSIS — E039 Hypothyroidism, unspecified: Secondary | ICD-10-CM | POA: Diagnosis not present

## 2018-12-13 LAB — BASIC METABOLIC PANEL
BUN: 20 mg/dL (ref 6–23)
CHLORIDE: 105 meq/L (ref 96–112)
CO2: 29 meq/L (ref 19–32)
Calcium: 9.4 mg/dL (ref 8.4–10.5)
Creatinine, Ser: 0.79 mg/dL (ref 0.40–1.20)
GFR: 89.05 mL/min (ref >60.00)
GLUCOSE: 81 mg/dL (ref 70–99)
POTASSIUM: 4 meq/L (ref 3.5–5.1)
SODIUM: 141 meq/L (ref 135–145)

## 2018-12-13 LAB — BRAIN NATRIURETIC PEPTIDE: Pro B Natriuretic peptide (BNP): 53 pg/mL (ref 0.0–100.0)

## 2018-12-13 LAB — TSH: TSH: 0.69 u[IU]/mL (ref 0.35–4.50)

## 2018-12-13 NOTE — Progress Notes (Signed)
Subjective:    Patient ID: Romie LeveeMary Henrietta Breeland, female    DOB: 11/08/1932, 85 y.o.   MRN: [id number]  No chief complaint on file. Pt is accompanied by her daughter.  HPI Patient was seen today for follow-up on chronic conditions. Pt has had increased falls over the last few months.  Pt last fell 2 days ago, notes swelling in left leg.  Pt states she "slipped on the ice" and lost her balance (the weather had been unseasonably warm).  A person driving by saw pt fall and drove her to her daughter's house.  Pt has chronic edema in left leg compared to right.  Denies erythema, increased warmth, lesions, SOB, DOE.  Pt has been staying with her daughter and her family.  Pt's grandsons help her with taking her meds.  Pt has been consistently taking her levothyroxine.    Pt's daughter states she is trying to get HCPOA and POA for her mother.  Past Medical History:  Diagnosis Date  . Anginal pain (HCC)    1990's; 09/28/2012  . Arthritis    "very mild" (09/28/2012)  . Coronary artery disease 09/27/2012   [place] of [place]. 2. cath 02/2013 patent mid RCA stent, 30% left main, 70% mid to distaal tanden 70% lesions x 3, 60% mid diag, 50% ostial LCx, 40% mid LCx.   . Glaucoma   . Hyperlipidemia   . Hypothyroidism     Allergies  Allergen Reactions  . Other Other (See Comments)    Metals; "skin gets weepy; gets worse if it makes contact" (09/28/2012)  . Pollen Extract Other (See Comments)    "sneezing; watery eyes"  . Latex Itching and Swelling    Skin swells  . Morphine And Related Rash  . Nickel Other (See Comments)    Causes weepiness (skin)  . Penicillins Rash    Has patient had a PCN reaction causing immediate rash, facial/tongue/throat swelling, SOB or lightheadedness with hypotension: Yes Has patient had a PCN reaction causing severe rash involving mucus membranes or skin necrosis: Unk Has patient had a PCN reaction that required  hospitalization: No  Has patient had a PCN reaction occurring within the last 10 years: No If all of the above answers are "NO", then may proceed with Cephalosporin use.     ROS General: Denies fever, chills, night sweats, changes in weight, changes in appetite HEENT: Denies headaches, ear pain, changes in vision, rhinorrhea, sore throat CV: Denies CP, palpitations, SOB, orthopnea Pulm: Denies SOB, cough, wheezing GI: Denies abdominal pain, nausea, vomiting, diarrhea, constipation GU: Denies dysuria, hematuria, frequency, vaginal discharge Msk: Denies muscle cramps, joint pains  +LE edema Neuro: Denies weakness, numbness, tingling  +balance issues Skin: Denies rashes, bruising Psych: Denies depression, anxiety, hallucinations  +memory concerns    Objective:    Blood pressure 128/68, pulse 78, temperature 98.4 F (36.9 C), temperature source Oral, weight 126 lb (57.2 kg), SpO2 98 %.  Gen. Pleasant, well-nourished, in no distress, normal affect   HEENT: [place]/AT, face symmetric,  no scleral icterus, PERRLA, nares patent without drainage Lungs: no accessory muscle use, CTAB, no wheezes or rales Cardiovascular: RRR, no m/r/g, no peripheral edema MSK:  LLE >RLE at baseline.  LLE with 2+ pitting edema, RLE 1+ pitting edema to knee.  No calf tenderness.  Negative Homans sign.  No erythema, induration, increased warmth, lesions. Neuro:  A&Ox3, CN II-XII intact, normal gait Skin:  Warm, dry  appearing, intact. No lesions/ rash   Wt Readings from Last 3 Encounters:  12/13/18 126 lb (57.2 kg)  10/31/18 126 lb (57.2 kg)  07/21/18 131 lb (59.4 kg)    Lab Results  Component Value Date   WBC 3.8 (L) 10/03/2018   HGB 13.6 10/03/2018   HCT 40.2 10/03/2018   PLT 257 10/03/2018   GLUCOSE 94 10/03/2018   CHOL 192 11/12/2016   TRIG 46 11/12/2016   HDL 101 11/12/2016   LDLDIRECT 127.7 01/14/2014   LDLCALC 82 11/12/2016   ALT 32 10/03/2018   AST 84 (H) 10/03/2018   NA 134 (L) 10/03/2018   K  3.4 (L) 10/03/2018   CL 96 (L) 10/03/2018   CREATININE 1.19 (H) 10/03/2018   BUN 11 10/03/2018   CO2 28 10/03/2018   TSH 118.080 (H) 10/03/2018   INR 1.0 03/02/2013   HGBA1C 5.6 01/16/2018    Assessment/Plan:  Bilateral lower extremity edema  -discussed wearing TED hose.  Given paper to obtain them at local medical supply -pt encouraged to elevate LEs when sitting, decrease prolonged standing or sitting, and decreased sodium intake -given handout - Plan: Basic metabolic panel, Brain Natriuretic Peptide, D-dimer, Quantitative -Given RTC or ED precautions.  Hypothyroidism, unspecified type  - Plan: TSH -continue levothyroxine 125 mcg daily -given handout  Balance problem  -Last CT head w/o contrast 01/17/18 was normal for age -consider repeating. - Plan: Ambulatory referral to Physical Therapy   F/u prn   Abbe Amsterdam, MD

## 2018-12-13 NOTE — Patient Instructions (Addendum)
Edema  Edema is an abnormal buildup of fluids in the body tissues and under the skin. Swelling of the legs, feet, and ankles is a common symptom that becomes more likely as you get older. Swelling is also common in looser tissues, like around the eyes. When the affected area is squeezed, the fluid may move out of that spot and leave a dent for a few moments. This dent is called pitting edema. There are many possible causes of edema. Eating too much salt (sodium) and being on your feet or sitting for a long time can cause edema in your legs, feet, and ankles. Hot weather may make edema worse. Common causes of edema include:  Heart failure.  Liver or kidney disease.  Weak leg blood vessels.  Cancer.  An injury.  Pregnancy.  Medicines.  Being obese.  Low protein levels in the blood. Edema is usually painless. Your skin may look swollen or shiny. Follow these instructions at home:  Keep the affected body part raised (elevated) above the level of your heart when you are sitting or lying down.  Do not sit still or stand for long periods of time.  Do not wear tight clothing. Do not wear garters on your upper legs.  Exercise your legs to get your circulation going. This helps to move the fluid back into your blood vessels, and it may help the swelling go down.  Wear elastic bandages or support stockings to reduce swelling as told by your health care provider.  Eat a low-salt (low-sodium) diet to reduce fluid as told by your health care provider.  Depending on the cause of your swelling, you may need to limit how much fluid you drink (fluid restriction).  Take over-the-counter and prescription medicines only as told by your health care provider. Contact a health care provider if:  Your edema does not get better with treatment.  You have heart, liver, or kidney disease and have symptoms of edema.  You have sudden and unexplained weight gain. Get help right away if:  You develop  shortness of breath or chest pain.  You cannot breathe when you lie down.  You develop pain, redness, or warmth in the swollen areas.  You have heart, liver, or kidney disease and suddenly get edema.  You have a fever and your symptoms suddenly get worse. Summary  Edema is an abnormal buildup of fluids in the body tissues and under the skin.  Eating too much salt (sodium) and being on your feet or sitting for a long time can cause edema in your legs, feet, and ankles.  Keep the affected body part raised (elevated) above the level of your heart when you are sitting or lying down. This information is not intended to replace advice given to you by your health care provider. Make sure you discuss any questions you have with your health care provider. Document Released: 11/15/2005 Document Revised: 12/18/2016 Document Reviewed: 12/18/2016 Elsevier Interactive Patient Education  2019 [employer]  Hypothyroidism  Hypothyroidism is when the thyroid gland does not make enough of certain hormones (it is underactive). The thyroid gland is a small gland located in the lower front part of the neck, just in front of the windpipe (trachea). This gland makes hormones that help control how the body uses food for energy (metabolism) as well as how the heart and brain function. These hormones also play a role in keeping your bones strong. When the thyroid is underactive, it produces too little of the hormones  thyroxine (T4) and triiodothyronine (T3). What are the causes? This condition may be caused by:  Hashimoto's disease. This is a disease in which the body's disease-fighting system (immune system) attacks the thyroid gland. This is the most common cause.  Viral infections.  Pregnancy.  Certain medicines.  Birth defects.  Past radiation treatments to the head or neck for cancer.  Past treatment with radioactive iodine.  Past exposure to radiation in the environment.  Past surgical  removal of part or all of the thyroid.  Problems with a gland in the center of the brain (pituitary gland).  Lack of enough iodine in the diet. What increases the risk? You are more likely to develop this condition if:  You are female.  You have a family history of thyroid conditions.  You use a medicine called lithium.  You take medicines that affect the immune system (immunosuppressants). What are the signs or symptoms? Symptoms of this condition include:  Feeling as though you have no energy (lethargy).  Not being able to tolerate cold.  Weight gain that is not explained by a change in diet or exercise habits.  Lack of appetite.  Dry skin.  Coarse hair.  Menstrual irregularity.  Slowing of thought processes.  Constipation.  Sadness or depression. How is this diagnosed? This condition may be diagnosed based on:  Your symptoms, your medical history, and a physical exam.  Blood tests. You may also have imaging tests, such as an ultrasound or MRI. How is this treated? This condition is treated with medicine that replaces the thyroid hormones that your body does not make. After you begin treatment, it may take several weeks for symptoms to go away. Follow these instructions at home:  Take over-the-counter and prescription medicines only as told by your health care provider.  If you start taking any new medicines, tell your health care provider.  Keep all follow-up visits as told by your health care provider. This is important. ? As your condition improves, your dosage of thyroid hormone medicine may change. ? You will need to have blood tests regularly so that your health care provider can monitor your condition. Contact a health care provider if:  Your symptoms do not get better with treatment.  You are taking thyroid replacement medicine and you: ? Sweat a lot. ? Have tremors. ? Feel anxious. ? Lose weight rapidly. ? Cannot tolerate heat. ? Have  emotional swings. ? Have diarrhea. ? Feel weak. Get help right away if you have:  Chest pain.  An irregular heartbeat.  A rapid heartbeat.  Difficulty breathing. Summary  Hypothyroidism is when the thyroid gland does not make enough of certain hormones (it is underactive).  When the thyroid is underactive, it produces too little of the hormones thyroxine (T4) and triiodothyronine (T3).  The most common cause is Hashimoto's disease, a disease in which the body's disease-fighting system (immune system) attacks the thyroid gland. The condition can also be caused by viral infections, medicine, pregnancy, or past radiation treatment to the head or neck.  Symptoms may include weight gain, dry skin, constipation, feeling as though you do not have energy, and not being able to tolerate cold.  This condition is treated with medicine to replace the thyroid hormones that your body does not make. This information is not intended to replace advice given to you by your health care provider. Make sure you discuss any questions you have with your health care provider. Document Released: 11/15/2005 Document Revised: 10/26/2017 Document Reviewed: 10/26/2017  Elsevier Interactive Patient Education  [employer].  Hypothyroidism  Hypothyroidism is when the thyroid gland does not make enough of certain hormones (it is underactive). The thyroid gland is a small gland located in the lower front part of the neck, just in front of the windpipe (trachea). This gland makes hormones that help control how the body uses food for energy (metabolism) as well as how the heart and brain function. These hormones also play a role in keeping your bones strong. When the thyroid is underactive, it produces too little of the hormones thyroxine (T4) and triiodothyronine (T3). What are the causes? This condition may be caused by:  Hashimoto's disease. This is a disease in which the body's disease-fighting system (immune  system) attacks the thyroid gland. This is the most common cause.  Viral infections.  Pregnancy.  Certain medicines.  Birth defects.  Past radiation treatments to the head or neck for cancer.  Past treatment with radioactive iodine.  Past exposure to radiation in the environment.  Past surgical removal of part or all of the thyroid.  Problems with a gland in the center of the brain (pituitary gland).  Lack of enough iodine in the diet. What increases the risk? You are more likely to develop this condition if:  You are female.  You have a family history of thyroid conditions.  You use a medicine called lithium.  You take medicines that affect the immune system (immunosuppressants). What are the signs or symptoms? Symptoms of this condition include:  Feeling as though you have no energy (lethargy).  Not being able to tolerate cold.  Weight gain that is not explained by a change in diet or exercise habits.  Lack of appetite.  Dry skin.  Coarse hair.  Menstrual irregularity.  Slowing of thought processes.  Constipation.  Sadness or depression. How is this diagnosed? This condition may be diagnosed based on:  Your symptoms, your medical history, and a physical exam.  Blood tests. You may also have imaging tests, such as an ultrasound or MRI. How is this treated? This condition is treated with medicine that replaces the thyroid hormones that your body does not make. After you begin treatment, it may take several weeks for symptoms to go away. Follow these instructions at home:  Take over-the-counter and prescription medicines only as told by your health care provider.  If you start taking any new medicines, tell your health care provider.  Keep all follow-up visits as told by your health care provider. This is important. ? As your condition improves, your dosage of thyroid hormone medicine may change. ? You will need to have blood tests regularly so  that your health care provider can monitor your condition. Contact a health care provider if:  Your symptoms do not get better with treatment.  You are taking thyroid replacement medicine and you: ? Sweat a lot. ? Have tremors. ? Feel anxious. ? Lose weight rapidly. ? Cannot tolerate heat. ? Have emotional swings. ? Have diarrhea. ? Feel weak. Get help right away if you have:  Chest pain.  An irregular heartbeat.  A rapid heartbeat.  Difficulty breathing. Summary  Hypothyroidism is when the thyroid gland does not make enough of certain hormones (it is underactive).  When the thyroid is underactive, it produces too little of the hormones thyroxine (T4) and triiodothyronine (T3).  The most common cause is Hashimoto's disease, a disease in which the body's disease-fighting system (immune system) attacks the thyroid gland. The condition can  also be caused by viral infections, medicine, pregnancy, or past radiation treatment to the head or neck.  Symptoms may include weight gain, dry skin, constipation, feeling as though you do not have energy, and not being able to tolerate cold.  This condition is treated with medicine to replace the thyroid hormones that your body does not make. This information is not intended to replace advice given to you by your health care provider. Make sure you discuss any questions you have with your health care provider. Document Released: 11/15/2005 Document Revised: 10/26/2017 Document Reviewed: 10/26/2017 Elsevier Interactive Patient Education  2019 [employer].

## 2018-12-14 ENCOUNTER — Other Ambulatory Visit: Payer: Self-pay | Admitting: Family Medicine

## 2018-12-14 DIAGNOSIS — R600 Localized edema: Secondary | ICD-10-CM

## 2018-12-14 LAB — D-DIMER, QUANTITATIVE: D-Dimer, Quant: 0.52 mcg/mL FEU — ABNORMAL HIGH (ref <0.50)

## 2018-12-15 ENCOUNTER — Telehealth: Payer: Self-pay

## 2018-12-15 ENCOUNTER — Ambulatory Visit (HOSPITAL_COMMUNITY)
Admission: RE | Admit: 2018-12-15 | Discharge: 2018-12-15 | Disposition: A | Discharge status: Home / Self Care | Payer: Medicare Other | Source: Ambulatory Visit | Attending: Family Medicine | Admitting: Family Medicine

## 2018-12-15 DIAGNOSIS — R600 Localized edema: Secondary | ICD-10-CM | POA: Diagnosis not present

## 2018-12-15 NOTE — Telephone Encounter (Signed)
Spoke with pt daughter advised that Dr Salomon Fick wanted pt to go [facility] for a Doppler to rule out DVT, Pt  provided with all information, location and time of the appointment, voiced understanding the importance to take pt for the appointment

## 2018-12-26 ENCOUNTER — Ambulatory Visit: Payer: Medicare Other | Attending: Family Medicine

## 2019-01-02 ENCOUNTER — Other Ambulatory Visit: Payer: Self-pay

## 2019-01-02 ENCOUNTER — Ambulatory Visit: Payer: Medicare Other | Attending: Family Medicine

## 2019-01-02 DIAGNOSIS — M6281 Muscle weakness (generalized): Secondary | ICD-10-CM | POA: Insufficient documentation

## 2019-01-02 DIAGNOSIS — R2689 Other abnormalities of gait and mobility: Secondary | ICD-10-CM | POA: Diagnosis not present

## 2019-01-02 NOTE — Therapy (Signed)
[facility] [address], [address] [place], [place], [address] Phone: [phone]   Fax:  [phone]  Physical Therapy Evaluation  Patient Details  Name: Sabrina LeveeMary Henrietta Gut MRN: [id number] Date of Birth: 06/19/1932 Referring Provider (PT): Abbe AmsterdamBanks, Shannon, MD   Encounter Date: 01/02/2019  PT End of Session - 01/02/19 1315    Visit Number  1    Date for PT Re-Evaluation  02/27/19    Authorization Type  Medicare    PT Start Time  1248   late   PT Stop Time  1315    PT Time Calculation (min)  27 min    Activity Tolerance  Patient tolerated treatment well    Behavior During Therapy  [facility] for tasks assessed/performed   needs redirection      Past Medical History:  Diagnosis Date  . Anginal pain (HCC)    1990's; 09/28/2012  . Arthritis    "very mild" (09/28/2012)  . Coronary artery disease 09/27/2012   s/p PCI of the RCA with DES [place] 15 [place] 20 years ago. 2. cath 02/2013 patent mid RCA stent, 30% left main, 70% mid to distaal tanden 70% lesions x 3, 60% mid diag, 50% ostial LCx, 40% mid LCx.   . Glaucoma   . Hyperlipidemia   . Hypothyroidism     Past Surgical History:  Procedure Laterality Date  . CARDIAC CATHETERIZATION  1990s   PCI  . CATARACT EXTRACTION W/ INTRAOCULAR LENS  IMPLANT, BILATERAL  1970's  . CORONARY ANGIOPLASTY WITH STENT PLACEMENT  09/28/2012   20-30% diffuse LAD stenosis, mild-mod D1 dz, 70% distal D1 lesion, mild diffuse LCx dz, 50-60% OM1 stenosis, 60% mid & 90% mid-dis RCA stenosis s/p DES; LVEF 75-80% w/ hyperdynamic fxn.   . DENTAL SURGERY     "implants"  . INNER EAR SURGERY  ~ 2010   "put plugs in so I could fly" (09/28/2012)  . LEFT HEART CATHETERIZATION WITH CORONARY ANGIOGRAM N/A 09/28/2012   Procedure: LEFT HEART CATHETERIZATION WITH CORONARY ANGIOGRAM;  Surgeon: Vesta MixerPhilip J Nahser, MD;  [facility];  Service: Cardiovascular;  Laterality: N/A;  . LUMBAR DISC  SURGERY  1980's   "took out a little piece" (09/28/2012)  . PERCUTANEOUS CORONARY STENT INTERVENTION (PCI-S)  09/28/2012   Procedure: PERCUTANEOUS CORONARY STENT INTERVENTION (PCI-S);  Surgeon: Vesta MixerPhilip J Nahser, MD;  [facility];  Service: Cardiovascular;;  . REDUCTION MAMMAPLASTY  1970's    There were no vitals filed for this visit.   Subjective Assessment - 01/02/19 1248    Subjective  Pt presents to PT with her daughter and reports that she has been having balance deficits.      Pertinent History  dementia- poor historian    Diagnostic tests  none    Patient Stated Goals  Pt's daughter reports that she wants to improve balance    Currently in Pain?  No/denies         [facility] - 01/02/19 0001      Assessment   Medical Diagnosis  balance problems    Referring Provider (PT)  Abbe AmsterdamBanks, Shannon, MD    Onset Date/Surgical Date  08/02/18   approximate   Prior Therapy  none      Precautions   Precautions  Fall;Other (comment)   memory deficits     Restrictions   Weight Bearing Restrictions  No      Balance Screen   Has the patient fallen in the  past 6 months  Yes    How many times?  --   pt's daughter reports falls- pt reports stumbling   Has the patient had a decrease in activity level because of a fear of falling?   No    Is the patient reluctant to leave their home because of a fear of falling?   No      Home Environment   Living Environment  Private residence    Living Arrangements  Alone    Type of Home  House    Home Access  Stairs to enter    Entrance Stairs-Number of Steps  8    Home Layout  One level    Additional Comments  daughter lives close by      Prior Function   Level of Independence  Independent with basic ADLs    Vocation  Retired    Leisure  walking and gardening      Cognition   Overall Cognitive Status  History of cognitive impairments - at baseline    Memory  Impaired      Posture/Postural Control   Posture/Postural  Control  Postural limitations    Postural Limitations  Forward head;Rounded Shoulders;Flexed trunk      ROM / Strength   AROM / PROM / Strength  AROM;Strength      AROM   Overall AROM   Within functional limits for tasks performed      Strength   Overall Strength  Deficits    Overall Strength Comments  4 to 4+/5 LE strength- pt with difficulty following cues      Transfers   Transfers  Stand to Sit;Sit to Stand    Sit to Stand  With upper extremity assist    Five time sit to stand comments   14 seconds    Stand to Sit  With upper extremity assist      Ambulation/Gait   Ambulation/Gait  Yes    Ambulation/Gait Assistance  6: Modified independent (Device/Increase time)    Gait Pattern  Decreased stride length;Decreased dorsiflexion - right;Decreased dorsiflexion - left;Poor foot clearance - left;Poor foot clearance - right      Balance   Balance Assessed  Yes      High Level Balance   High Level Balance Comments  single leg stance on Rt 6 seconds, Lt 2 seconds                Objective measurements completed on examination: See above findings.              PT Education - 01/02/19 1313    Education Details  Access Code: [email or web address]    Person(s) Educated  Patient;Child(ren)    Methods  Explanation;Demonstration    Comprehension  Verbalized understanding;Returned demonstration       PT Short Term Goals - 01/02/19 1331      PT SHORT TERM GOAL #1   Title  be independent in initial HEP    Time  4    Period  Weeks    Status  New    Target Date  01/30/19      PT SHORT TERM GOAL #2   Title  demonstrate heel strike with gait on level surface with minimal to moderate verbal cues    Time  4    Period  Weeks    Status  New    Target Date  01/30/19        PT Long Term Goals - 01/02/19 1334  PT LONG TERM GOAL #1   Title  be independent in advanced HEP    Time  8    Status  New    Target Date  02/27/19      PT LONG TERM GOAL #2   Title   demonstrate heel strike and normalized step length on level surface with minimal to moderate verbal cueing    Time  8    Period  Weeks    Status  New    Target Date  02/27/19      PT LONG TERM GOAL #3   Title  pt and her daughter verbalize understanding of fall prevention techniques    Time  8    Period  Weeks    Status  New    Target Date  02/27/19      PT LONG TERM GOAL #4   Title  perform single leg stance x 8 seconds on Rt and Lt LE to reduce risk of falling    Time  8    Period  Weeks    Status  New    Target Date  02/27/19             Plan - 01/02/19 1328    Clinical Impression Statement  Pt presents to PT with her daughter with complaints of decline in balance.  Pt's daughter reports that she has had falls recently.  Pt is a poor historian and had difficulty answering questions regarding history of falls.  Pt demonstrates shuffling gait pattern with shortened step length and poor foot clearance bilaterally.  Pt had difficulty correcting this pattern when PT provided verbal and demo cues.  Pt performed 5x sit to stand in 14 seconds and is able to stand on Rt LE for 6 seconds and Lt for 2 seconds.  Pt is able to stand with feet together with eyes open and closed without difficulty.  Pt will benefit from skilled PT for HEP to address strength, balance and gait.      History and Personal Factors relevant to plan of care:  dementia- poor follow through    Clinical Presentation  Stable    Clinical Decision Making  Low    Rehab Potential  Good    PT Frequency  1x / week    PT Duration  8 weeks    PT Treatment/Interventions  ADLs/Self Care Home Management;Therapeutic exercise;Therapeutic activities;Functional mobility training;Stair training;Gait training;Neuromuscular re-education;Patient/family education;Manual techniques    PT Next Visit Plan  work on heel strike with gait and increase step length, LE strength, endurance, balance    PT Home Exercise Plan  Access Code: [email or web address]     Consulted and Agree with Plan of Care  Patient       Patient will benefit from skilled therapeutic intervention in order to improve the following deficits and impairments:  Decreased balance  Visit Diagnosis: Muscle weakness (generalized) - Plan: PT plan of care cert/re-cert  Other abnormalities of gait and mobility - Plan: PT plan of care cert/re-cert     Problem List Patient Active Problem List   Diagnosis Date Noted  . Lower extremity edema 04/26/2017  . Unstable angina (HCC) 03/02/2013  . Hypothyroidism 09/29/2012  . Hyperlipidemia 09/29/2012  . Hypokalemia 09/29/2012  . CAD (coronary artery disease) 09/27/2012    Lorrene ReidKelly Anilah Huck, PT 01/02/19 1:39 PM  [facility] [address], [address] [place], [place], [address] Phone: [phone]   Fax:  [phone]  Name: Sabrina LeveeMary Henrietta Girton MRN:  [id number] Date of Birth: 29-Jul-1932

## 2019-01-02 NOTE — Patient Instructions (Signed)
Access Code: [email or web address]  URL: [email or web address]  Date: 01/02/2019  Prepared by: Lorrene Reid   Exercises Seated Long Arc Quad - 10 reps - 2 sets - 5 hold - 3x daily - 7x weekly Seated March - 10 reps - 3 sets - 3x daily - 7x weekly Seated Heel Toe Raises - 10 reps - 2 sets - 3x daily - 7x weekly

## 2019-01-10 ENCOUNTER — Ambulatory Visit: Payer: Medicare Other

## 2019-01-23 ENCOUNTER — Ambulatory Visit: Payer: Medicare Other

## 2019-01-23 DIAGNOSIS — R2689 Other abnormalities of gait and mobility: Secondary | ICD-10-CM

## 2019-01-23 DIAGNOSIS — M6281 Muscle weakness (generalized): Secondary | ICD-10-CM

## 2019-01-23 NOTE — Therapy (Signed)
[facility] [address], [address] [place], [place], [address] Phone: [phone]   Fax:  [phone]  Physical Therapy Treatment  Patient Details  Name: Sabrina Hart MRN: [id number] Date of Birth: Sep 28, 1932 Referring Provider (PT): Abbe Amsterdam, MD   Encounter Date: 01/23/2019  PT End of Session - 01/23/19 1314    Visit Number  2    Date for PT Re-Evaluation  02/27/19    Authorization Type  Medicare    PT Start Time  1233    PT Stop Time  1314    PT Time Calculation (min)  41 min    Activity Tolerance  Patient tolerated treatment well    Behavior During Therapy  [facility] for tasks assessed/performed       Past Medical History:  Diagnosis Date  . Anginal pain (HCC)    1990's; 09/28/2012  . Arthritis    "very mild" (09/28/2012)  . Coronary artery disease 09/27/2012   s/p PCI of the RCA with DES with remote PCI in [place] 15 to 20 years ago. 2. cath 02/2013 patent mid RCA stent, 30% left main, 70% mid to distaal tanden 70% lesions x 3, 60% mid diag, 50% ostial LCx, 40% mid LCx.   . Glaucoma   . Hyperlipidemia   . Hypothyroidism     Past Surgical History:  Procedure Laterality Date  . CARDIAC CATHETERIZATION  1990s   PCI  . CATARACT EXTRACTION W/ INTRAOCULAR LENS  IMPLANT, BILATERAL  1970's  . CORONARY ANGIOPLASTY WITH STENT PLACEMENT  09/28/2012   20-30% diffuse LAD stenosis, mild-mod D1 dz, 70% distal D1 lesion, mild diffuse LCx dz, 50-60% OM1 stenosis, 60% mid & 90% mid-dis RCA stenosis s/p DES; LVEF 75-80% w/ hyperdynamic fxn.   . DENTAL SURGERY     "implants"  . INNER EAR SURGERY  ~ 2010   "put plugs in so I could fly" (09/28/2012)  . LEFT HEART CATHETERIZATION WITH CORONARY ANGIOGRAM N/A 09/28/2012   Procedure: LEFT HEART CATHETERIZATION WITH CORONARY ANGIOGRAM;  Surgeon: [name], MD;  Location: [facility] CATH LAB;  Service: Cardiovascular;  Laterality: N/A;  . LUMBAR DISC SURGERY  1980's   "took out  a little piece" (09/28/2012)  . PERCUTANEOUS CORONARY STENT INTERVENTION (PCI-S)  09/28/2012   Procedure: PERCUTANEOUS CORONARY STENT INTERVENTION (PCI-S);  Surgeon: [name], MD;  Location: [facility] CATH LAB;  Service: Cardiovascular;;  . REDUCTION MAMMAPLASTY  1970's    There were no vitals filed for this visit.  Subjective Assessment - 01/23/19 1235    Subjective  I havent been doing my exercises like I should.      Patient Stated Goals  Pt's daughter reports that she wants to improve balance    Currently in Pain?  No/denies                       [facility] Adult PT Treatment/Exercise - 01/23/19 0001      Ambulation/Gait   Gait Comments  work on heel strike with gait on level surface 4x25 feet.  Pt demonstrated narrow base of support and some staggering while practicing this.        Exercises   Exercises  Knee/Hip;Ankle      Knee/Hip Exercises: Aerobic   Nustep  Level 2 x 10 minutes   PT present to discuss progress     Knee/Hip Exercises: Standing   Heel Raises  Both;2 sets;10 reps    Hip Abduction  --  Abduction Limitations  --    Rocker Board  3 minutes      Knee/Hip Exercises: Seated   Long Arc Quad  Strengthening;Both;2 sets;10 reps    [employer]  5 second hold 2x10    Marching  Strengthening;Left;2 sets;10 reps    Sit to [employer]  2 sets;10 reps;without UE support      Ankle Exercises: Seated   Heel Raises  20 reps    Toe Raise  20 reps             PT Education - 01/23/19 1256    Education Details  Access Code: [email or web address]    Person(s) Educated  Patient    Methods  Explanation;Demonstration    Comprehension  Verbalized understanding;Returned demonstration       PT Short Term Goals - 01/02/19 1331      PT SHORT TERM GOAL #1   Title  be independent in initial HEP    Time  4    Period  Weeks    Status  New    Target Date  01/30/19      PT SHORT TERM GOAL #2   Title  demonstrate heel strike with gait on level surface with minimal to  moderate verbal cues    Time  4    Period  Weeks    Status  New    Target Date  01/30/19        PT Long Term Goals - 01/02/19 1334      PT LONG TERM GOAL #1   Title  be independent in advanced HEP    Time  8    Status  New    Target Date  02/27/19      PT LONG TERM GOAL #2   Title  demonstrate heel strike and normalized step length on level surface with minimal to moderate verbal cueing    Time  8    Period  Weeks    Status  New    Target Date  02/27/19      PT LONG TERM GOAL #3   Title  pt and her daughter verbalize understanding of fall prevention techniques    Time  8    Period  Weeks    Status  New    Target Date  02/27/19      PT LONG TERM GOAL #4   Title  perform single leg stance x 8 seconds on Rt and Lt LE to reduce risk of falling    Time  8    Period  Weeks    Status  New    Target Date  02/27/19            Plan - 01/23/19 1239    Clinical Impression Statement  Pt with first time follow-up after evaluation. Pt reports intermittent compliance with initial HEP.  Session today focused on LE strength, standing balance activities and endurance to improve safety and endurance at home and in the community.  Pt required frequent cueing to stay on task.  Pt requires stand by assist for safety with exercise today.  Follow-through is limited due to dementia and pt lives alone.       Rehab Potential  Good    PT Frequency  1x / week    PT Duration  8 weeks    PT Treatment/Interventions  ADLs/Self Care Home Management;Therapeutic exercise;Therapeutic activities;Functional mobility training;Stair training;Gait training;Neuromuscular re-education;Patient/family education;Manual techniques    PT Next Visit Plan  work on heel strike  with gait and increase step length, LE strength, endurance, balance. Add to HEP next session.      PT Home Exercise Plan  Access Code: [email or web address]    Recommended Other Services  initial certification is signed    Consulted and Agree with Plan of  Care  Patient       Patient will benefit from skilled therapeutic intervention in order to improve the following deficits and impairments:  Decreased balance, Decreased strength, Decreased activity tolerance  Visit Diagnosis: Muscle weakness (generalized)  Other abnormalities of gait and mobility     Problem List Patient Active Problem List   Diagnosis Date Noted  . Lower extremity edema 04/26/2017  . Unstable angina (HCC) 03/02/2013  . Hypothyroidism 09/29/2012  . Hyperlipidemia 09/29/2012  . Hypokalemia 09/29/2012  . CAD (coronary artery disease) 09/27/2012    Lorrene Reid, PT 01/23/19 1:16 PM  [facility] [address], [address] [place], [place], [address] Phone: [phone]   Fax:  [phone]  Name: Sabrina Hart MRN: [id number] Date of Birth: Mar 22, 1932

## 2019-01-23 NOTE — Patient Instructions (Signed)
Access Code: [email or web address]  URL: [email or web address]  Date: 01/23/2019  Prepared by: Lorrene Reid   Exercises   Seated Hip Abduction with Resistance - 10 reps - 2 sets - 2x daily                            - 7x weekly Seated Hip Adduction Isometrics with Ball - 10 reps - 2 sets - 5 hold - 2x daily - 7x weekly

## 2019-01-30 ENCOUNTER — Ambulatory Visit: Payer: Medicare Other | Attending: Family Medicine

## 2019-01-30 DIAGNOSIS — M6281 Muscle weakness (generalized): Secondary | ICD-10-CM | POA: Diagnosis not present

## 2019-01-30 DIAGNOSIS — R2689 Other abnormalities of gait and mobility: Secondary | ICD-10-CM | POA: Diagnosis not present

## 2019-01-30 NOTE — Therapy (Signed)
[facility] [address] [place], [place], [address] Phone: [phone]   Fax:  [phone]  Physical Therapy Treatment  Patient Details  Name: Sabrina Hart MRN: [id number] Date of Birth: 1932/03/04 Referring Provider (PT): Abbe Amsterdam, MD   Encounter Date: 01/30/2019  PT End of Session - 01/30/19 1315    Visit Number  3    Date for PT Re-Evaluation  02/27/19    Authorization Type  Medicare    PT Start Time  1234    PT Stop Time  1314    PT Time Calculation (min)  40 min    Activity Tolerance  Patient tolerated treatment well    Behavior During Therapy  [facility] for tasks assessed/performed       Past Medical History:  Diagnosis Date  . Anginal pain (HCC)    1990's; 09/28/2012  . Arthritis    "very mild" (09/28/2012)  . Coronary artery disease 09/27/2012   s/p PCI of the RCA with DES with remote PCI in [place] 15 to 20 years ago. 2. cath 02/2013 patent mid RCA stent, 30% left main, 70% mid to distaal tanden 70% lesions x 3, 60% mid diag, 50% ostial LCx, 40% mid LCx.   . Glaucoma   . Hyperlipidemia   . Hypothyroidism     Past Surgical History:  Procedure Laterality Date  . CARDIAC CATHETERIZATION  1990s   PCI  . CATARACT EXTRACTION W/ INTRAOCULAR LENS  IMPLANT, BILATERAL  1970's  . CORONARY ANGIOPLASTY WITH STENT PLACEMENT  09/28/2012   20-30% diffuse LAD stenosis, mild-mod D1 dz, 70% distal D1 lesion, mild diffuse LCx dz, 50-60% OM1 stenosis, 60% mid & 90% mid-dis RCA stenosis s/p DES; LVEF 75-80% w/ hyperdynamic fxn.   . DENTAL SURGERY     "implants"  . INNER EAR SURGERY  ~ 2010   "put plugs in so I could fly" (09/28/2012)  . LEFT HEART CATHETERIZATION WITH CORONARY ANGIOGRAM N/A 09/28/2012   Procedure: LEFT HEART CATHETERIZATION WITH CORONARY ANGIOGRAM;  Surgeon: [name], MD;  Location: [facility] CATH LAB;  Service: Cardiovascular;  Laterality: N/A;  . LUMBAR DISC SURGERY  1980's   "took out a  little piece" (09/28/2012)  . PERCUTANEOUS CORONARY STENT INTERVENTION (PCI-S)  09/28/2012   Procedure: PERCUTANEOUS CORONARY STENT INTERVENTION (PCI-S);  Surgeon: [name], MD;  Location: [facility] CATH LAB;  Service: Cardiovascular;;  . REDUCTION MAMMAPLASTY  1970's    There were no vitals filed for this visit.  Subjective Assessment - 01/30/19 1241    Subjective  I am doing well.      Patient Stated Goals  Pt's daughter reports that she wants to improve balance    Currently in Pain?  No/denies                       [facility] Adult PT Treatment/Exercise - 01/30/19 0001      Ambulation/Gait   Gait Comments  work on heel strike with gait on level surface 4x25 feet.  Pt demonstrated narrow base of support and some staggering while practicing this.        Knee/Hip Exercises: Aerobic   Nustep  Level 2 x 10 minutes   PT present to discuss progress     Knee/Hip Exercises: Standing   Heel Raises  Both;2 sets;10 reps    Hip Abduction  Stengthening;Both;2 sets;10 reps    Abduction Limitations  2# added    Hip Extension  Stengthening;Both;2 sets;10 reps  Extension Limitations  2#    Rocker Board  3 minutes      Knee/Hip Exercises: Seated   Long Arc Quad  Strengthening;Both;2 sets;10 reps    Long Arc Quad Weight  2 lbs.    Marching  Strengthening;Left;2 sets;10 reps    Marching Weights  2 lbs.          Balance Exercises - 01/30/19 1311      Balance Exercises: Standing   Tandem Stance  Eyes open;Upper extremity support 1;3 reps;20 secs        PT Education - 01/30/19 1302    Education Details  Access Code: [email or web address]    Person(s) Educated  Patient    Methods  Explanation;Demonstration    Comprehension  Verbalized understanding;Returned demonstration       PT Short Term Goals - 01/30/19 1242      PT SHORT TERM GOAL #1   Title  be independent in initial HEP    Baseline  working on independence    Time  4    Period  Weeks    Status  On-going      PT  SHORT TERM GOAL #2   Title  demonstrate heel strike with gait on level surface with minimal to moderate verbal cues    Baseline  not consistent    Time  4    Period  Weeks    Status  On-going        PT Long Term Goals - 01/02/19 1334      PT LONG TERM GOAL #1   Title  be independent in advanced HEP    Time  8    Status  New    Target Date  02/27/19      PT LONG TERM GOAL #2   Title  demonstrate heel strike and normalized step length on level surface with minimal to moderate verbal cueing    Time  8    Period  Weeks    Status  New    Target Date  02/27/19      PT LONG TERM GOAL #3   Title  pt and her daughter verbalize understanding of fall prevention techniques    Time  8    Period  Weeks    Status  New    Target Date  02/27/19      PT LONG TERM GOAL #4   Title  perform single leg stance x 8 seconds on Rt and Lt LE to reduce risk of falling    Time  8    Period  Weeks    Status  New    Target Date  02/27/19            Plan - 01/30/19 1253    Clinical Impression Statement  Pt tolerated the advancement of strength exercises today with standing exercise and addition of ankle weights.  Pt requires stand by assistance for safety with standing exercise and for verbal cues for technique.  Pt with inconsistent technique.  Pt continues to require verbal cues for heel strike with gait.  This is improved overall.  Pt will continue to benefit from skilled PT for LE strength, endurance and balance activities.      Rehab Potential  Good    PT Frequency  1x / week    PT Duration  8 weeks    PT Treatment/Interventions  ADLs/Self Care Home Management;Therapeutic exercise;Therapeutic activities;Functional mobility training;Stair training;Gait training;Neuromuscular re-education;Patient/family education;Manual techniques    PT Next Visit Plan  review new HEP, work on gait with heel strike, balance, strength    PT Home Exercise Plan  Access Code: [email or web address]    Consulted and Agree with  Plan of Care  Patient       Patient will benefit from skilled therapeutic intervention in order to improve the following deficits and impairments:  Decreased balance, Decreased strength, Decreased activity tolerance  Visit Diagnosis: Muscle weakness (generalized)  Other abnormalities of gait and mobility     Problem List Patient Active Problem List   Diagnosis Date Noted  . Lower extremity edema 04/26/2017  . Unstable angina (HCC) 03/02/2013  . Hypothyroidism 09/29/2012  . Hyperlipidemia 09/29/2012  . Hypokalemia 09/29/2012  . CAD (coronary artery disease) 09/27/2012    Lorrene Hart, PT 01/30/19 1:18 PM  [facility] [address], [address] [place], [place], [address] Phone: [phone]   Fax:  [phone]  Name: Shailee Dowden MRN: [id number] Date of Birth: May 21, 1932

## 2019-01-30 NOTE — Patient Instructions (Signed)
Access Code: [email or web address]  URL: [email or web address]  Date: 01/30/2019  Prepared by: Lorrene Reid   Exercises   Standing Hip Abduction - 10 reps - 2 sets - 2x daily - 7x weekly Standing Hip Extension - 10 reps - 2 sets - 2x daily - 7x weekly Standing Heel Raise with Support - 10 reps - 2 sets - 2x daily - 7x weekly

## 2019-02-13 ENCOUNTER — Ambulatory Visit: Payer: Medicare Other

## 2019-02-13 ENCOUNTER — Other Ambulatory Visit: Payer: Self-pay

## 2019-02-13 DIAGNOSIS — R2689 Other abnormalities of gait and mobility: Secondary | ICD-10-CM | POA: Diagnosis not present

## 2019-02-13 DIAGNOSIS — M6281 Muscle weakness (generalized): Secondary | ICD-10-CM | POA: Diagnosis not present

## 2019-02-13 NOTE — Therapy (Addendum)
[facility] [address], [place], [place], [address] Phone: [phone]   Fax:  [phone]  Physical Therapy Treatment  Patient Details  Name: Sabrina Hart MRN: [id number] Date of Birth: 1932-01-10 Referring Provider (PT): Grier Mitts, MD   Encounter Date: 02/13/2019  PT End of Session - 02/13/19 1101    Visit Number  4    Date for PT Re-Evaluation  02/27/19    Authorization Type  Medicare    PT Start Time  1018    PT Stop Time  1050    PT Time Calculation (min)  32 min    Activity Tolerance  Patient tolerated treatment well    Behavior During Therapy  [facility] for tasks assessed/performed       Past Medical History:  Diagnosis Date  . Anginal pain ([place])    1990's; 09/28/2012  . Arthritis    "very mild" (09/28/2012)  . Coronary artery disease 09/27/2012   s/p PCI of the RCA with DES with remote PCI in [place] 15 to 20 years ago. 2. cath 02/2013 patent mid RCA stent, 30% left main, 70% mid to distaal tanden 70% lesions x 3, 60% mid diag, 50% ostial LCx, 40% mid LCx.   . Glaucoma   . Hyperlipidemia   . Hypothyroidism     Past Surgical History:  Procedure Laterality Date  . CARDIAC CATHETERIZATION  1990s   PCI  . CATARACT EXTRACTION W/ INTRAOCULAR LENS  IMPLANT, BILATERAL  1970's  . CORONARY ANGIOPLASTY WITH STENT PLACEMENT  09/28/2012   20-30% diffuse LAD stenosis, mild-mod D1 dz, 70% distal D1 lesion, mild diffuse LCx dz, 50-60% OM1 stenosis, 60% mid & 90% mid-dis RCA stenosis s/p DES; LVEF 75-80% w/ hyperdynamic fxn.   . DENTAL SURGERY     "implants"  . INNER EAR SURGERY  ~ 2010   "put plugs in so I could fly" (09/28/2012)  . LEFT HEART CATHETERIZATION WITH CORONARY ANGIOGRAM N/A 09/28/2012   Procedure: LEFT HEART CATHETERIZATION WITH CORONARY ANGIOGRAM;  Surgeon: Thayer Headings, MD;  Location: [facility] CATH LAB;  Service: Cardiovascular;  Laterality: N/A;  . LUMBAR DISC SURGERY  1980's   "took out  a little piece" (09/28/2012)  . PERCUTANEOUS CORONARY STENT INTERVENTION (PCI-S)  09/28/2012   Procedure: PERCUTANEOUS CORONARY STENT INTERVENTION (PCI-S);  Surgeon: Thayer Headings, MD;  Location: [facility] CATH LAB;  Service: Cardiovascular;;  . REDUCTION MAMMAPLASTY  1970's    There were no vitals filed for this visit.  Subjective Assessment - 02/13/19 1018    Subjective  I am doing good.  Pt's daugther says she is ready for D/C to HEP    Patient Stated Goals  Pt's daughter reports that she wants to improve balance    Currently in Pain?  No/denies         [facility] PT Assessment - 02/13/19 0001      Assessment   Medical Diagnosis  balance problems    Referring Provider (PT)  Grier Mitts, MD    Onset Date/Surgical Date  08/02/18   approximate                  [facility] Adult PT Treatment/Exercise - 02/13/19 0001      Ambulation/Gait   Gait Comments  work on heel strike with gait on level surface 4x25 feet.  Pt demonstrated narrow base of support and some staggering while practicing this.        Knee/Hip Exercises: Aerobic   Nustep  Level  2 x 10 minutes   PT present to discuss progress     Knee/Hip Exercises: Standing   Heel Raises  Both;2 sets;10 reps    Hip Abduction  Stengthening;Both;2 sets;10 reps    Abduction Limitations  2.5#    Hip Extension  Stengthening;Both;2 sets;10 reps    Extension Limitations  2.5#    Rocker Board  3 minutes      Knee/Hip Exercises: Seated   Long Arc Quad  Strengthening;Both;2 sets;10 reps    Long Arc Quad Weight  --   2.5#   [facility]  5 second hold 2x10    Marching  Strengthening;Left;2 sets;10 reps    Marching Weights  --   2.5#              PT Short Term Goals - 02/13/19 1034      PT SHORT TERM GOAL #1   Title  be independent in initial HEP    Status  Achieved      PT SHORT TERM GOAL #2   Title  demonstrate heel strike with gait on level surface with minimal to moderate verbal cues    Status  Achieved         PT Long Term Goals - 02/13/19 1034      PT LONG TERM GOAL #1   Title  be independent in advanced HEP    Period  --    Status  Achieved      PT LONG TERM GOAL #2   Title  demonstrate heel strike and normalized step length on level surface with minimal to moderate verbal cueing    Status  Partially Met      PT LONG TERM GOAL #3   Title  pt and her daughter verbalize understanding of fall prevention techniques    Status  Achieved      PT LONG TERM GOAL #4   Title  perform single leg stance x 8 seconds on Rt and Lt LE to reduce risk of falling    Status  Not Met            Plan - 02/13/19 1027    Clinical Impression Statement  Pt has difficulty with compliance with HEP at home due to dementia.  Pt participates in all exercises in the clinic without difficulty.  Pt does require stand by assistance for safety with exercises and minor verbal cues for technique.  PT tolerated addition of ankle weights for increased challenge today.  Pt with improved gait pattern and heel strike is more consistent.  Pt and her daughter requested D/C from PT today due to pt's age and risk of community dwelling with onset of coronavirus.  Pt will continue with walking at home for exercise and HEP for further strength gains.      Rehab Potential  --    PT Frequency  --    PT Duration  --    PT Treatment/Interventions  --    PT Next Visit Plan  D/C PT to HEP.    PT Home Exercise Plan  Access Code: [email or web address]    Consulted and Agree with Plan of Care  Patient       Patient will benefit from skilled therapeutic intervention in order to improve the following deficits and impairments:     Visit Diagnosis: Muscle weakness (generalized)  Other abnormalities of gait and mobility     Problem List Patient Active Problem List   Diagnosis Date Noted  . Lower extremity edema 04/26/2017  .  Unstable angina ([place]) 03/02/2013  . Hypothyroidism 09/29/2012  . Hyperlipidemia 09/29/2012  . Hypokalemia  09/29/2012  . CAD (coronary artery disease) 09/27/2012   PHYSICAL THERAPY DISCHARGE SUMMARY  Visits from Start of Care: 5  Current functional level related to goals / functional outcomes: See above for current status.  Pt will D/C to HEP.     Remaining deficits: Shortened step length and limited functional endurance.     Education / Equipment: HEP, fall prevention Plan: Patient agrees to discharge.  Patient goals were partially met. Patient is being discharged due to the patient's request.  ?????        Sigurd Sos, PT 02/13/19 11:31 AM  [facility] [address], [place], [place], [address] Phone: [phone]   Fax:  [phone]  Name: Sabrina Hart MRN: [id number] Date of Birth: 1932/04/11

## 2019-02-20 ENCOUNTER — Encounter: Payer: Medicare Other | Admitting: Physical Therapy

## 2019-02-27 ENCOUNTER — Ambulatory Visit: Payer: Medicare Other

## 2019-03-26 ENCOUNTER — Telehealth: Payer: Self-pay | Admitting: Family Medicine

## 2019-03-26 NOTE — Telephone Encounter (Signed)
Copied from CRM [id number]. Topic: Quick Communication - See Telephone Encounter >> Mar 26, 2019  3:01 PM Terisa Starr wrote: CRM for notification. See Telephone encounter for: 03/26/19.  Patient's daughter said she is ready to place her mother in an adult daycare or facility for dementia and would like to know what she needs to do    [phone]

## 2019-03-27 NOTE — Telephone Encounter (Signed)
Pt's daughter should start by looking online for area programs that accept pts with dementia.  Given the COVID-19 pandemic unsure if the programs are still being held at this time or if they are taking new participents.  Most programs have a form(s) that need to be completed prior to starting.

## 2019-03-27 NOTE — Telephone Encounter (Signed)
Please advise 

## 2019-03-29 NOTE — Telephone Encounter (Signed)
Spoke with pt daughter who is on pt DPR, verbalized understanding of the recommendations given by Dr Salomon Fick

## 2019-03-29 NOTE — Telephone Encounter (Signed)
Called pt daughter left detailed VM with dr Salomon Fick recommendations, advised to call office for further information

## 2019-04-11 ENCOUNTER — Telehealth: Payer: Self-pay | Admitting: Family Medicine

## 2019-04-11 NOTE — Telephone Encounter (Signed)
Pt daughter is calling in stating that the pt is walking to the neighborhood store that is 2 miles away.  And she is purchasing wine and buying over 2-3 bottles of wine a week and also 40's and she is hiding it in different other bottles (pancake syrup bottle and etc).  They are very concerned about her walking to [place] neighborhood store on a busy street.  Daughter state that the PT helped her with her walking and now she is getting out of control walking to the store and the bank withdrawal of money.  The neighborhood is helping keep an eye out on the pt and if they see the pt a distance from the house they will call the daughter to go and get her.

## 2019-04-12 ENCOUNTER — Encounter: Payer: Self-pay | Admitting: Family Medicine

## 2019-04-12 DIAGNOSIS — F02C0 Dementia in other diseases classified elsewhere, severe, without behavioral disturbance, psychotic disturbance, mood disturbance, and anxiety: Secondary | ICD-10-CM | POA: Insufficient documentation

## 2019-04-12 DIAGNOSIS — F0390 Unspecified dementia without behavioral disturbance: Secondary | ICD-10-CM | POA: Insufficient documentation

## 2019-04-12 NOTE — Telephone Encounter (Signed)
Please advise 

## 2019-04-13 NOTE — Telephone Encounter (Signed)
Please let pt's daughter know that I may not be able to do much in regards to pt's new found independence, though her safety is always a concern to me.  As we discussed before, medications such as donepezil may help slow the progression of her dementia.  Consider having someone sit with the pt to limit her trips to the corner store, but I know Sabrina Hart may not agree with this suggestion.  You can also consider the day programs we mentioned in the past, but unsure if they are being held 2/2 the COVID-19 pandemic.

## 2019-04-13 NOTE — Telephone Encounter (Signed)
Called pt daughter who is listed on pt DPR, left a VM to return call in the office regarding pt concerns

## 2019-05-01 ENCOUNTER — Telehealth: Payer: Self-pay

## 2019-05-01 NOTE — Telephone Encounter (Signed)
Spoke with pt daugther who states that pt has limited her visits to the store, states that she is willing to try the Donezapril  on pt. Copied from CRM [id number]. Topic: General - Other >> Apr 19, 2019  1:57 PM Percival Spanish wrote:   Pt returning Harriett Sine from 04/12/2019 would like ca call back

## 2019-05-07 ENCOUNTER — Other Ambulatory Visit: Payer: Self-pay | Admitting: Family Medicine

## 2019-05-07 DIAGNOSIS — F0390 Unspecified dementia without behavioral disturbance: Secondary | ICD-10-CM

## 2019-05-07 MED ORDER — DONEPEZIL HCL 5 MG PO TABS: 5.0000 mg | ORAL_TABLET | Freq: Every day | ORAL | 3 refills | Status: DC

## 2019-05-07 NOTE — Telephone Encounter (Signed)
Rx sent to pharmacy   

## 2019-05-07 NOTE — Telephone Encounter (Signed)
noted 

## 2019-05-10 NOTE — Telephone Encounter (Signed)
Spoke with pt daugther who states that pt has limited her visits to the store, states that she is willing to try the Donezapril on pt. Please advise  Spoke with pt daughter who states that pt has limited her visits to the store, states that she is willing to have pt try  Donezapril. Rx was sent to pt pharmacy by Dr Volanda Napoleon

## 2019-10-03 ENCOUNTER — Other Ambulatory Visit: Payer: Self-pay

## 2019-10-03 DIAGNOSIS — E039 Hypothyroidism, unspecified: Secondary | ICD-10-CM

## 2019-10-03 MED ORDER — LEVOTHYROXINE SODIUM 125 MCG PO TABS: ORAL_TABLET | ORAL | 0 refills | Status: DC

## 2019-12-28 ENCOUNTER — Other Ambulatory Visit: Payer: Self-pay | Admitting: Family Medicine

## 2019-12-28 DIAGNOSIS — E039 Hypothyroidism, unspecified: Secondary | ICD-10-CM

## 2019-12-28 NOTE — Telephone Encounter (Signed)
Pt needs appointment for further refills 

## 2020-01-25 ENCOUNTER — Other Ambulatory Visit: Payer: Self-pay | Admitting: Family Medicine

## 2020-01-25 DIAGNOSIS — E039 Hypothyroidism, unspecified: Secondary | ICD-10-CM

## 2020-01-25 NOTE — Telephone Encounter (Signed)
Patient need to schedule an ov for more refills. 

## 2020-01-28 ENCOUNTER — Other Ambulatory Visit: Payer: Self-pay

## 2020-02-20 ENCOUNTER — Other Ambulatory Visit: Payer: Self-pay | Admitting: Family Medicine

## 2020-02-20 ENCOUNTER — Other Ambulatory Visit: Payer: Self-pay

## 2020-02-20 ENCOUNTER — Ambulatory Visit (INDEPENDENT_AMBULATORY_CARE_PROVIDER_SITE_OTHER): Payer: Medicare Other | Admitting: Family Medicine

## 2020-02-20 ENCOUNTER — Encounter: Payer: Self-pay | Admitting: Family Medicine

## 2020-02-20 ENCOUNTER — Ambulatory Visit (INDEPENDENT_AMBULATORY_CARE_PROVIDER_SITE_OTHER): Payer: Medicare Other

## 2020-02-20 VITALS — BP 148/80 | HR 70 | Temp 97.9°F | Wt 127.0 lb

## 2020-02-20 DIAGNOSIS — R030 Elevated blood-pressure reading, without diagnosis of hypertension: Secondary | ICD-10-CM | POA: Diagnosis not present

## 2020-02-20 DIAGNOSIS — F0390 Unspecified dementia without behavioral disturbance: Secondary | ICD-10-CM

## 2020-02-20 DIAGNOSIS — E039 Hypothyroidism, unspecified: Secondary | ICD-10-CM

## 2020-02-20 DIAGNOSIS — Z0000 Encounter for general adult medical examination without abnormal findings: Secondary | ICD-10-CM | POA: Diagnosis not present

## 2020-02-20 DIAGNOSIS — E782 Mixed hyperlipidemia: Secondary | ICD-10-CM

## 2020-02-20 DIAGNOSIS — H0520 Unspecified exophthalmos: Secondary | ICD-10-CM

## 2020-02-20 DIAGNOSIS — S6992XA Unspecified injury of left wrist, hand and finger(s), initial encounter: Secondary | ICD-10-CM | POA: Diagnosis not present

## 2020-02-20 DIAGNOSIS — M79645 Pain in left finger(s): Secondary | ICD-10-CM

## 2020-02-20 LAB — CBC WITH DIFFERENTIAL/PLATELET
Basophils Absolute: 0 10*3/uL (ref 0.0–0.1)
Basophils Relative: 1 % (ref 0.0–3.0)
Eosinophils Absolute: 0.1 10*3/uL (ref 0.0–0.7)
Eosinophils Relative: 2.9 % (ref 0.0–5.0)
HCT: 40.7 % (ref 36.0–46.0)
Hemoglobin: 13.8 g/dL (ref 12.0–15.0)
Lymphocytes Relative: 30.3 % (ref 12.0–46.0)
Lymphs Abs: 1.3 10*3/uL (ref 0.7–4.0)
MCHC: 33.9 g/dL (ref 30.0–36.0)
MCV: 96.2 fl (ref 78.0–100.0)
Monocytes Absolute: 0.4 10*3/uL (ref 0.1–1.0)
Monocytes Relative: 8.8 % (ref 3.0–12.0)
Neutro Abs: 2.5 10*3/uL (ref 1.4–7.7)
Neutrophils Relative %: 57 % (ref 43.0–77.0)
Platelets: 248 10*3/uL (ref 150.0–400.0)
RBC: 4.23 Mil/uL (ref 3.87–5.11)
RDW: 13.4 % (ref 11.5–15.5)
WBC: 4.4 10*3/uL (ref 4.0–10.5)

## 2020-02-20 LAB — LIPID PANEL
Cholesterol: 265 mg/dL — ABNORMAL HIGH (ref 0–200)
HDL: 86.4 mg/dL (ref >39.00)
LDL Cholesterol: 161 mg/dL — ABNORMAL HIGH (ref 0–99)
NonHDL: 178.13
Total CHOL/HDL Ratio: 3
Triglycerides: 85 mg/dL (ref 0.0–149.0)
VLDL: 17 mg/dL (ref 0.0–40.0)

## 2020-02-20 LAB — BASIC METABOLIC PANEL
BUN: 17 mg/dL (ref 6–23)
CO2: 32 mEq/L (ref 19–32)
Calcium: 9.6 mg/dL (ref 8.4–10.5)
Chloride: 101 mEq/L (ref 96–112)
Creatinine, Ser: 0.81 mg/dL (ref 0.40–1.20)
GFR: 81.17 mL/min (ref >60.00)
Glucose, Bld: 86 mg/dL (ref 70–99)
Potassium: 4.1 mEq/L (ref 3.5–5.1)
Sodium: 140 mEq/L (ref 135–145)

## 2020-02-20 LAB — TSH: TSH: 28.73 u[IU]/mL — ABNORMAL HIGH (ref 0.35–4.50)

## 2020-02-20 LAB — HEMOGLOBIN A1C: Hgb A1c MFr Bld: 5.7 % (ref 4.6–6.5)

## 2020-02-20 MED ORDER — LEVOTHYROXINE SODIUM 137 MCG PO TABS: 137.0000 ug | ORAL_TABLET | Freq: Every day | ORAL | 3 refills | Status: DC

## 2020-02-20 NOTE — Progress Notes (Signed)
Subjective:   Sabrina Hart is a 86 y.o. female who presents for Medicare Annual (Subsequent) preventive examination.  Pt states she is doing well.  Pt stopped taking all meds with the exception of synthroid 125 mcg.  Pt has had several falls.  Per pt's daughter, pt at times complains of L thumb pain.  Unclear if thumb was injured in a fall, currently not painful.  Pt notes changes in vision at times.  Pt accompanied by her daughter.   Review of Systems:  Unable to assess 2/2 memory deficit.       Objective:     Vitals: BP (!) 148/80 (BP Location: Left Arm, Patient Position: Sitting, Cuff Size: Normal)   Pulse 70   Temp 97.9 F (36.6 C) (Temporal)   Wt 127 lb (57.6 kg)   SpO2 98%   BMI 23.23 kg/m   Body mass index is 23.23 kg/m.   Gen. Pleasant, well developed, well-nourished, in NAD HEENT - [place]/AT, PERRL, exophthalmos, EOMI, no scleral icterus, no nasal drainage, pharynx without erythema or exudate. TMs and canals normal b/l. Neck: No JVD, no thyromegaly, no carotid bruits Lungs: no use of accessory muscles, CTAB, no wheezes, rales or rhonchi Cardiovascular: RRR, No r/g/m, no peripheral edema Abdomen: BS present, soft, nontender,nondistended, no hepatosplenomegaly Musculoskeletal: No TTP of b/l wrist or hands.  No deformities, moves all four extremities, no cyanosis or clubbing, normal tone Neuro:  A&Ox3, CN II-XII intact, normal gait Skin:  Warm, dry, intact, no lesions   Advanced Directives 01/02/2019 10/04/2018 10/04/2018 10/03/2018 03/09/2013 09/28/2012  Does Patient Have a Medical Advance Directive? No No No No Patient does not have advance directive Patient does not have advance directive  Would patient like information on creating a medical advance directive? No - Patient declined No - Patient declined No - Patient declined No - Patient declined - -  Pre-existing out of facility DNR order (yellow form or pink MOST form) - - - - No No    Tobacco Social History    Tobacco Use  Smoking Status Never Smoker  Smokeless Tobacco Never Used      Past Medical History:  Diagnosis Date  . Anginal pain (HCC)    1990's; 09/28/2012  . Arthritis    "very mild" (09/28/2012)  . Coronary artery disease 09/27/2012   s/p PCI of the RCA with DES with remote PCI in [place] 15 to 20 years ago. 2. cath 02/2013 patent mid RCA stent, 30% left main, 70% mid to distaal tanden 70% lesions x 3, 60% mid diag, 50% ostial LCx, 40% mid LCx.   . Glaucoma   . Hyperlipidemia   . Hypothyroidism    Past Surgical History:  Procedure Laterality Date  . CARDIAC CATHETERIZATION  1990s   PCI  . CATARACT EXTRACTION W/ INTRAOCULAR LENS  IMPLANT, BILATERAL  1970's  . CORONARY ANGIOPLASTY WITH STENT PLACEMENT  09/28/2012   20-30% diffuse LAD stenosis, mild-mod D1 dz, 70% distal D1 lesion, mild diffuse LCx dz, 50-60% OM1 stenosis, 60% mid & 90% mid-dis RCA stenosis s/p DES; LVEF 75-80% w/ hyperdynamic fxn.   . DENTAL SURGERY     "implants"  . INNER EAR SURGERY  ~ 2010   "put plugs in so I could fly" (09/28/2012)  . LEFT HEART CATHETERIZATION WITH CORONARY ANGIOGRAM N/A 09/28/2012   Procedure: LEFT HEART CATHETERIZATION WITH CORONARY ANGIOGRAM;  Surgeon: [name], MD;  Location[facility] C[facility] LAB;  Service: Cardiovascular;  Laterality: N/A;  . LUMBAR DISC SURGERY  9983'J   "took out a little piece" (09/28/2012)  . PERCUTANEOUS CORONARY STENT INTERVENTION (PCI-S)  09/28/2012   Procedure: PERCUTANEOUS CORONARY STENT INTERVENTION (PCI-S);  Surgeon: [name], MD;  Location: [facility] CATH LAB;  Service: Cardiovascular;;  . REDUCTION MAMMAPLASTY  1970's   History reviewed. No pertinent family history. Social History   Socioeconomic History  . Marital status: Single    Spouse name: Not on file  . Number of children: Not on file  . Years of education: Not on file  . Highest education level: Not on file  Occupational History  . Not on file  Tobacco Use  . Smoking status:  Never Smoker  . Smokeless tobacco: Never Used  Substance and Sexual Activity  . Alcohol use: Yes    Alcohol/week: 1.0 standard drinks    Types: 1 Glasses of wine per week    Comment: 09/28/2012 "1 glass of wine averages once/wk; beer average once/week during summer w/friends"  . Drug use: No  . Sexual activity: Never  Other Topics Concern  . Not on file  Social History Narrative  . Not on file   Social Determinants of Health   Financial Resource Strain:   . Difficulty of Paying Living Expenses:   Food Insecurity:   . Worried About Programme researcher, broadcasting/film/video in the Last Year:   . Barista in the Last Year:   Transportation Needs:   . Freight forwarder (Medical):   Marland Kitchen Lack of Transportation (Non-Medical):   Physical Activity:   . Days of Exercise per Week:   . Minutes of Exercise per Session:   Stress:   . Feeling of Stress :   Social Connections:   . Frequency of Communication with Friends and Family:   . Frequency of Social Gatherings with Friends and Family:   . Attends Religious Services:   . Active Member of Clubs or Organizations:   . Attends Banker Meetings:   Marland Kitchen Marital Status:     Outpatient Encounter Medications as of 02/20/2020  Medication Sig  . acetaminophen (TYLENOL) 500 MG tablet Take 2 tablets (1,000 mg total) by mouth every 6 (six) hours as needed.  . AMBULATORY NON FORMULARY MEDICATION Take 300 mg by mouth as directed. Medication Name: Inclirisan sodium 300mg  vs placebo (orion-10 Study per protocol  . aspirin EC 81 MG tablet Take 1 tablet (81 mg total) by mouth daily.  atorvastatin (LIPITOR) 40 MG tablet TAKE 1 TABLET BY MOUTH EVERY DAY  . atorvastatin (LIPITOR) 40 MG tablet TAKE 1 TABLET BY MOUTH EVERY DAY  . donepezil (ARICEPT) 5 MG tablet Take 1 tablet (5 mg total) by mouth at bedtime.  Marland Kitchen levothyroxine (SYNTHROID) 125 MCG tablet TAKE 1 TABLET BY MOUTH EVERY MORNING ON EMPTY STOMACH WITH A FULL GLASS OF WATER  . nitroGLYCERIN  (NITROSTAT) 0.4 MG SL tablet PLACE 1 TABLET UNDER THE TONGUE EVERY 5 MINUTES AS NEEDED FOR CHEST PAIN.   No facility-administered encounter medications on file as of 02/20/2020.    Activities of Daily Living No flowsheet data found.  Patient Care Team: 02/22/2020, MD as PCP - General (Family Medicine)    Assessment:   This is a routine wellness examination for [facility].  Exercise Activities and Dietary recommendations    Goals   None     Fall Risk Fall Risk  02/20/2020 02/20/2020 02/20/2020 02/20/2020 09/09/2017  Falls in the past year? - 1 - 1 No  Number falls in past yr: -  1 - 1 -  Injury with Fall? - 0 - 0 -  Risk for fall due to : History of fall(s);Impaired balance/gait History of fall(s);Impaired balance/gait History of fall(s);Impaired balance/gait - -  Follow up - Follow up appointment - - -   Is the patient's home free of loose throw rugs in walkways, pet beds, electrical cords, etc?   no      Grab bars in the bathroom? no      Handrails on the stairs?   yes      Adequate lighting?   yes   Depression Screen PHQ 2/9 Scores 02/20/2020 09/09/2017 12/04/2012  PHQ - 2 Score 0 0 0     Cognitive Function Alert and oriented to person and place.  No longer taking acricept.  There is no immunization history on file for this patient.  Qualifies for Shingles Vaccine? Yes.  Pt does not like vaccines.  Screening Tests Health Maintenance  Topic Date Due  . TETANUS/TDAP  Never done  . DEXA SCAN  Never done  . PNA vac Low Risk Adult (1 of 2 - PCV13) Never done  . INFLUENZA VACCINE  Never done    Cancer Screenings: Lung: Low Dose CT Chest recommended if Age 40-80 years, 30 pack-year currently smoking OR have quit w/in 15years. Patient does not qualify. Breast:  Up to date on Mammogram? Not indicated 2/2 age   Up to date of Bone Density/Dexa? Declines Colorectal: not indicated 2/2 age  Additional Screenings:: Hepatitis C Screening:  Will offer     Plan:       Acquired hypothyroidism  -continue synthroid 125 mcg daily -will adjust dose if needed based on labs - Plan: TSH, Ambulatory referral to Ophthalmology  Elevated blood pressure reading in office without diagnosis of hypertension  -mildly elevated, recheck -Lifestyle modifications encouraged -Consider checking BP at home and keeping a log to bring with you to clinic -If continues to be elevated consider low-dose blood pressure medication - Plan: Basic metabolic panel  Medicare annual wellness visit, subsequent  Dementia without behavioral disturbance, unspecified dementia type ([name])  -pt d/c aricept. -continue to monitor - Plan: CBC with Differential/Platelet, Hemoglobin A1c  Mixed hyperlipidemia  -lifestyle modifications - Plan: Hemoglobin A1c, Lipid panel  Exophthalmos  - Plan: TSH, Ambulatory referral to Ophthalmology  Pain of left thumb  -tyelnol or OTC analgesic rubs prn - Plan: DG Finger Thumb Left, CBC with Differential/Platelet  Pt encouraged to get the COVID vaccine.   F/u in 2-3 months  I have personally reviewed and noted the following in the patient's chart:   . Medical and social history . Use of alcohol, tobacco or illicit drugs  . Current medications and supplements . Functional ability and status . Nutritional status . Physical activity . Advanced directives . List of other physicians . Hospitalizations, surgeries, and ER visits in previous 12 months . Vitals . Screenings to include cognitive, depression, and falls . Referrals and appointments  In addition, I have reviewed and discussed with patient certain preventive protocols, quality metrics, and best practice recommendations. A written personalized care plan for preventive services as well as general preventive health recommendations were provided to patient.     Billie Ruddy, MD  02/20/2020

## 2020-02-27 MED ORDER — ATORVASTATIN CALCIUM 20 MG PO TABS: 20.0000 mg | ORAL_TABLET | Freq: Every day | ORAL | 3 refills | Status: DC

## 2020-02-27 NOTE — Addendum Note (Signed)
Addended by: Johnella Moloney on: 02/27/2020 04:17 PM   Modules accepted: Orders

## 2020-02-29 ENCOUNTER — Other Ambulatory Visit: Payer: Self-pay | Admitting: Family Medicine

## 2020-02-29 DIAGNOSIS — E039 Hypothyroidism, unspecified: Secondary | ICD-10-CM

## 2020-03-12 DIAGNOSIS — H31003 Unspecified chorioretinal scars, bilateral: Secondary | ICD-10-CM | POA: Diagnosis not present

## 2020-03-12 DIAGNOSIS — H5211 Myopia, right eye: Secondary | ICD-10-CM | POA: Diagnosis not present

## 2020-03-12 DIAGNOSIS — H349 Unspecified retinal vascular occlusion: Secondary | ICD-10-CM | POA: Diagnosis not present

## 2020-03-12 DIAGNOSIS — H40013 Open angle with borderline findings, low risk, bilateral: Secondary | ICD-10-CM | POA: Diagnosis not present

## 2020-03-24 DIAGNOSIS — H35373 Puckering of macula, bilateral: Secondary | ICD-10-CM | POA: Diagnosis not present

## 2020-03-24 DIAGNOSIS — H348322 Tributary (branch) retinal vein occlusion, left eye, stable: Secondary | ICD-10-CM | POA: Diagnosis not present

## 2020-03-24 DIAGNOSIS — H35033 Hypertensive retinopathy, bilateral: Secondary | ICD-10-CM | POA: Diagnosis not present

## 2020-03-24 DIAGNOSIS — H43812 Vitreous degeneration, left eye: Secondary | ICD-10-CM | POA: Diagnosis not present

## 2020-03-25 ENCOUNTER — Encounter: Payer: Self-pay | Admitting: Cardiovascular Disease

## 2020-04-23 ENCOUNTER — Other Ambulatory Visit: Payer: Self-pay

## 2020-04-23 ENCOUNTER — Other Ambulatory Visit (INDEPENDENT_AMBULATORY_CARE_PROVIDER_SITE_OTHER): Payer: Medicare Other

## 2020-04-23 DIAGNOSIS — E039 Hypothyroidism, unspecified: Secondary | ICD-10-CM | POA: Diagnosis not present

## 2020-04-23 LAB — TSH: TSH: 8.79 u[IU]/mL — ABNORMAL HIGH (ref 0.35–4.50)

## 2020-05-22 ENCOUNTER — Ambulatory Visit: Payer: Medicare Other | Admitting: Family Medicine

## 2020-05-26 ENCOUNTER — Other Ambulatory Visit: Payer: Self-pay

## 2020-05-26 ENCOUNTER — Ambulatory Visit: Payer: Medicare Other | Admitting: Family Medicine

## 2020-06-04 ENCOUNTER — Ambulatory Visit: Payer: Medicare Other | Admitting: Family Medicine

## 2020-06-04 DIAGNOSIS — Z0289 Encounter for other administrative examinations: Secondary | ICD-10-CM

## 2020-06-21 ENCOUNTER — Other Ambulatory Visit: Payer: Self-pay | Admitting: Family Medicine

## 2020-06-21 DIAGNOSIS — E039 Hypothyroidism, unspecified: Secondary | ICD-10-CM

## 2020-06-23 DIAGNOSIS — H35033 Hypertensive retinopathy, bilateral: Secondary | ICD-10-CM | POA: Diagnosis not present

## 2020-06-23 DIAGNOSIS — H43812 Vitreous degeneration, left eye: Secondary | ICD-10-CM | POA: Diagnosis not present

## 2020-06-23 DIAGNOSIS — H348322 Tributary (branch) retinal vein occlusion, left eye, stable: Secondary | ICD-10-CM | POA: Diagnosis not present

## 2020-06-23 DIAGNOSIS — H35373 Puckering of macula, bilateral: Secondary | ICD-10-CM | POA: Diagnosis not present

## 2020-07-07 DIAGNOSIS — H401132 Primary open-angle glaucoma, bilateral, moderate stage: Secondary | ICD-10-CM | POA: Diagnosis not present

## 2020-07-25 ENCOUNTER — Ambulatory Visit (HOSPITAL_COMMUNITY)
Admission: EM | Admit: 2020-07-25 | Discharge: 2020-07-25 | Disposition: A | Discharge status: Home / Self Care | Payer: Medicare Other | Attending: Emergency Medicine | Admitting: Emergency Medicine

## 2020-07-25 ENCOUNTER — Other Ambulatory Visit: Payer: Self-pay

## 2020-07-25 ENCOUNTER — Ambulatory Visit (INDEPENDENT_AMBULATORY_CARE_PROVIDER_SITE_OTHER): Payer: Medicare Other

## 2020-07-25 ENCOUNTER — Encounter (HOSPITAL_COMMUNITY): Payer: Self-pay

## 2020-07-25 DIAGNOSIS — S59901A Unspecified injury of right elbow, initial encounter: Secondary | ICD-10-CM | POA: Diagnosis not present

## 2020-07-25 DIAGNOSIS — R030 Elevated blood-pressure reading, without diagnosis of hypertension: Secondary | ICD-10-CM

## 2020-07-25 DIAGNOSIS — M25521 Pain in right elbow: Secondary | ICD-10-CM

## 2020-07-25 MED ORDER — ONDANSETRON 4 MG PO TBDP
4.0000 mg | ORAL_TABLET | Freq: Once | ORAL | Status: AC
  Administered 2020-07-25: 4 mg via ORAL

## 2020-07-25 MED ORDER — ACETAMINOPHEN 500 MG PO TABS: 500.0000 mg | ORAL_TABLET | Freq: Four times a day (QID) | ORAL | 0 refills | Status: DC | PRN

## 2020-07-25 MED ORDER — ONDANSETRON 4 MG PO TBDP
ORAL_TABLET | ORAL | Status: AC
  Filled 2020-07-25: qty 1

## 2020-07-25 MED ORDER — ACETAMINOPHEN 325 MG PO TABS
975.0000 mg | ORAL_TABLET | Freq: Once | ORAL | Status: AC
  Administered 2020-07-25: 975 mg via ORAL

## 2020-07-25 MED ORDER — ACETAMINOPHEN 325 MG PO TABS
ORAL_TABLET | ORAL | Status: AC
  Filled 2020-07-25: qty 3

## 2020-07-25 NOTE — Progress Notes (Signed)
Orthopedic Tech Progress Note Patient Details:  Sabrina Hart 12-08-1931 [id number]  Ortho Devices Type of Ortho Device: Long arm splint, Arm sling Ortho Device/Splint Location: RUE Ortho Device/Splint Interventions: Application, Adjustment   Post Interventions Patient Tolerated: Well Instructions Provided: Adjustment of device, Care of device   Sabrina Hart E Sabrina Hart 07/25/2020, 9:36 PM

## 2020-07-25 NOTE — ED Provider Notes (Signed)
[facility]    CSN: [id number] Arrival date & time: 07/25/20  1806      History   Chief Complaint Chief Complaint  Patient presents with   Arm Pain    right     HPI Sabrina Hart is a 86 y.o. female.   Sabrina Hart presents with complaints of right arm pain. It was seemingly normal this morning, and daughter noted this evening that she was no longer moving the arm and was complaining of pain. Sabrina Hart does have baseline dementia. Her daughter states that she had mentioned a fall, but also complains of overuse injury from gardening. No witnessed injury. Pain appears to be to right elbow. No obvious redness or swelling. No previous surgeries to the elbow. Has not been diagnosed with hypertension, last PCP visit did have elevated blood pressure reading. Hasn't taken any medications for pain.    ROS per HPI, negative if not otherwise mentioned.      Past Medical History:  Diagnosis Date   Anginal pain [facility])    1990's; 09/28/2012   Arthritis    "very mild" (09/28/2012)   Coronary artery disease 09/27/2012   s/p PCI of the RCA with DES with remote PCI in [place] 15 to 20 years ago. 2. cath 02/2013 patent mid RCA stent, 30% left main, 70% mid to distaal tanden 70% lesions x 3, 60% mid diag, 50% ostial LCx, 40% mid LCx.    Glaucoma    Hyperlipidemia    Hypothyroidism     Patient Active Problem List   Diagnosis Date Noted   Dementia without behavioral disturbance (HCC) 04/12/2019   Lower extremity edema 04/26/2017   Unstable angina (HCC) 03/02/2013   Hypothyroidism 09/29/2012   Hyperlipidemia 09/29/2012   Hypokalemia 09/29/2012   CAD (coronary artery disease) 09/27/2012    Past Surgical History:  Procedure Laterality Date   CARDIAC CATHETERIZATION  1990s   PCI   CATARACT EXTRACTION W/ INTRAOCULAR LENS  IMPLANT, BILATERAL  1970's   CORONARY ANGIOPLASTY WITH STENT PLACEMENT  09/28/2012   20-30% diffuse LAD stenosis, mild-mod D1  dz, 70% distal D1 lesion, mild diffuse LCx dz, 50-60% OM1 stenosis, 60% mid & 90% mid-dis RCA stenosis s/p DES; LVEF 75-80% w/ hyperdynamic fxn.    DENTAL SURGERY     "implants"   INNER EAR SURGERY  ~ 2010   "put plugs in so I could fly" (09/28/2012)   LEFT HEART CATHETERIZATION WITH CORONARY ANGIOGRAM N/A 09/28/2012   Procedure: LEFT HEART CATHETERIZATION WITH CORONARY ANGIOGRAM;  Surgeon: [name], MD;  Location: [facility] CATH LAB;  Service: Cardiovascular;  Laterality: N/A;   LUMBAR DISC SURGERY  1980's   "took out a little piece" (09/28/2012)   PERCUTANEOUS CORONARY STENT INTERVENTION (PCI-S)  09/28/2012   Procedure: PERCUTANEOUS CORONARY STENT INTERVENTION (PCI-S);  Surgeon: [name], MD;  Location: [facility] CATH LAB;  Service: Cardiovascular;;   REDUCTION MAMMAPLASTY  1970's    OB History   No obstetric history on file.      Home Medications    Prior to Admission medications   Medication Sig Start Date End Date Taking? Authorizing Provider  acetaminophen (TYLENOL) 500 MG tablet Take 1 tablet (500 mg total) by mouth every 6 (six) hours as needed. 07/25/20   Georgetta Haber, NP  AMBULATORY NON FORMULARY MEDICATION Take 300 mg by mouth as directed. Medication Name: Inclirisan sodium 300mg  vs placebo (orion-10 Study per protocol 12/30/16   02/27/17, MD  aspirin EC 81 MG  tablet Take 1 tablet (81 mg total) by mouth daily. 05/17/17   Bhagat, Sharrell Ku, PA  atorvastatin (LIPITOR) 20 MG tablet Take 1 tablet (20 mg total) by mouth daily. 02/27/20   Deeann Saint, MD  donepezil (ARICEPT) 5 MG tablet Take 1 tablet (5 mg total) by mouth at bedtime. 05/07/19   Deeann Saint, MD  levothyroxine (SYNTHROID) 137 MCG tablet TAKE 1 TABLET (137 MCG TOTAL) BY MOUTH DAILY BEFORE BREAKFAST. 06/23/20   Deeann Saint, MD  nitroGLYCERIN (NITROSTAT) 0.4 MG SL tablet PLACE 1 TABLET UNDER THE TONGUE EVERY 5 MINUTES AS NEEDED FOR CHEST PAIN. 04/03/18   Nahser, Deloris Ping, MD    Family  History History reviewed. No pertinent family history.  Social History Social History   Tobacco Use   Smoking status: Never Smoker   Smokeless tobacco: Never Used  Building services engineer Use: Never used  Substance Use Topics   Alcohol use: Yes    Alcohol/week: 1.0 standard drink    Types: 1 Glasses of wine per week    Comment: 09/28/2012 "1 glass of wine averages once/wk; beer average once/week during summer w/friends"   Drug use: No     Allergies   Other, Pollen extract, Latex, Morphine and related, Nickel, and Penicillins   Review of Systems Review of Systems   Physical Exam Triage Vital Signs ED Triage Vitals  Enc Vitals Group     BP 07/25/20 1947 (!) 205/64     Pulse Rate 07/25/20 1947 87     Resp 07/25/20 1947 18     Temp 07/25/20 1947 98.9 F (37.2 C)     Temp src --      SpO2 07/25/20 1947 100 %     Weight --      Height --      Head Circumference --      Peak Flow --      Pain Score 07/25/20 1949 10     Pain Loc --      Pain Edu? --      Excl. in GC? --    No data found.  Updated Vital Signs BP (!) 174/61 (BP Location: Left Arm)    Pulse 87    Temp 98.9 F (37.2 C)    Resp 18    SpO2 100%   Visual Acuity Right Eye Distance:   Left Eye Distance:   Bilateral Distance:    Right Eye Near:   Left Eye Near:    Bilateral Near:     Physical Exam Constitutional:      General: She is not in acute distress.    Appearance: She is well-developed.  Cardiovascular:     Rate and Rhythm: Normal rate.  Pulmonary:     Effort: Pulmonary effort is normal.  Musculoskeletal:     Right elbow: No swelling. Decreased range of motion. Tenderness present.     Comments: Pain with forearm rotation, pain with elbow extension and flexion; with xray exam patient vomited due to pain; hand and wrist appear normal; non tender to upper arm and shoulder; sensation to hand intact with strong pulses   Skin:    General: Skin is warm and dry.  Neurological:     Mental  Status: She is alert. Mental status is at baseline. She is disoriented.     Comments: Daughter at bedside without report of change from baseline status. Alert.       UC Treatments / Results  Labs (all labs ordered  are listed, but only abnormal results are displayed) Labs Reviewed - No data to display  EKG   Radiology DG Elbow Complete Right  Result Date: 07/25/2020 CLINICAL DATA:  Elbow pain EXAM: RIGHT ELBOW - COMPLETE 3+ VIEW COMPARISON:  None. FINDINGS: No acute displaced fracture or dislocation. Suspected elbow effusion. Moderate arthritis at the elbow. IMPRESSION: Moderate arthritis at the elbow with suspected effusion. No definite acute osseous abnormality is seen. Radiographic and/or cross-sectional imaging follow-up depending on level of concern. Electronically Signed   By: Jasmine Pang M.D.   On: 07/25/2020 20:51    Procedures Procedures (including critical care time)  Medications Ordered in UC Medications  acetaminophen (TYLENOL) tablet 975 mg (975 mg Oral Given 07/25/20 2032)  ondansetron (ZOFRAN-ODT) disintegrating tablet 4 mg (4 mg Oral Given 07/25/20 2050)    Initial Impression / Assessment and Plan / UC Course  I have reviewed the triage vital signs and the nursing notes.  Pertinent labs & imaging results that were available during my care of the patient were reviewed by me and considered in my medical decision making (see chart for details).     Unknown injury. Quite severe right elbow pain, bracing arm to body, vomited with xray due to pain. Tylenol and zofran given. Splint applied, patient has a sling. Ortho follow up recommended for further evaluation. Patient's daughter verbalized understanding and agreeable to plan.   Final Clinical Impressions(s) / UC Diagnoses   Final diagnoses:  Injury of right elbow, initial encounter  Elevated blood pressure reading     Discharge Instructions     Xray was read as normal per radiology.  However, due to the  exceptional pain on exam we will place a splint to help with pain control.  Tylenol regularly, ice application.  I would like you to follow up with orthopedics for further evaluation and management.  Please follow up with your primary care provider for recheck of your BP.    ED Prescriptions    Medication Sig Dispense Auth. Provider   acetaminophen (TYLENOL) 500 MG tablet Take 1 tablet (500 mg total) by mouth every 6 (six) hours as needed. 30 tablet Georgetta Haber, NP     PDMP not reviewed this encounter.   Georgetta Haber, NP 07/25/20 2117

## 2020-07-25 NOTE — ED Triage Notes (Signed)
Pt present arm pain, pt is unsure if she fall or how she hurt her arm.

## 2020-07-25 NOTE — Discharge Instructions (Signed)
Xray was read as normal per radiology.  However, due to the exceptional pain on exam we will place a splint to help with pain control.  Tylenol regularly, ice application.  I would like you to follow up with orthopedics for further evaluation and management.  Please follow up with your primary care provider for recheck of your BP.

## 2020-07-29 DIAGNOSIS — H4720 Unspecified optic atrophy: Secondary | ICD-10-CM | POA: Diagnosis not present

## 2020-07-29 DIAGNOSIS — H401132 Primary open-angle glaucoma, bilateral, moderate stage: Secondary | ICD-10-CM | POA: Diagnosis not present

## 2020-07-29 DIAGNOSIS — R2231 Localized swelling, mass and lump, right upper limb: Secondary | ICD-10-CM | POA: Diagnosis not present

## 2020-08-20 DIAGNOSIS — M1811 Unilateral primary osteoarthritis of first carpometacarpal joint, right hand: Secondary | ICD-10-CM | POA: Diagnosis not present

## 2020-09-25 ENCOUNTER — Ambulatory Visit (INDEPENDENT_AMBULATORY_CARE_PROVIDER_SITE_OTHER): Payer: Medicare Other | Admitting: Family Medicine

## 2020-09-25 ENCOUNTER — Encounter: Payer: Self-pay | Admitting: Family Medicine

## 2020-09-25 ENCOUNTER — Other Ambulatory Visit: Payer: Self-pay

## 2020-09-25 VITALS — BP 128/60 | HR 78 | Temp 98.2°F | Ht 62.0 in | Wt 120.6 lb

## 2020-09-25 DIAGNOSIS — M79641 Pain in right hand: Secondary | ICD-10-CM

## 2020-09-25 DIAGNOSIS — F0390 Unspecified dementia without behavioral disturbance: Secondary | ICD-10-CM | POA: Diagnosis not present

## 2020-09-25 DIAGNOSIS — I25119 Atherosclerotic heart disease of native coronary artery with unspecified angina pectoris: Secondary | ICD-10-CM

## 2020-09-25 DIAGNOSIS — E782 Mixed hyperlipidemia: Secondary | ICD-10-CM

## 2020-09-25 DIAGNOSIS — Z0130 Encounter for examination of blood pressure without abnormal findings: Secondary | ICD-10-CM

## 2020-09-25 DIAGNOSIS — E039 Hypothyroidism, unspecified: Secondary | ICD-10-CM

## 2020-09-25 DIAGNOSIS — M1990 Unspecified osteoarthritis, unspecified site: Secondary | ICD-10-CM | POA: Diagnosis not present

## 2020-09-25 NOTE — Progress Notes (Signed)
Subjective:    Patient ID: Sabrina Hart, female    DOB: 1932-09-23, 86 y.o.   MRN: [id number]  No chief complaint on file. Patient accompanied by her daughter.  HPI Pt is a 86 yo female with pmh sig for hypothyroidism, CAD s/p PCI, HLD, dementia, arthrtitis was seen today for follow-up.  Pt seen in ED on 8/27 for R elbow pain. Pt had emesis 2/2 pain. Xray negative.  Given sling and advised to f/u with Ortho.  Of note bp was elevated in ED.  Patit had follow-up with Ortho-started on prednisone which reduced pain and swelling 2/2 multiple flea bites.  Pt had to move out of her home while it is being treated for fleas.  Fleas came from a stray cat that the patient had taken in.  She is currently living with her daughter.    Pt taking synthroid 137 mcg daily.  Pt's daughter inquires about timing of medication.  Currently taking with Lipitor and at times food.  Pt states she was not always taking her meds as she does not like taking things she does not really need.  Patient states she is doing well overall and is without complaint.  Per patient's daughter she did not start Aricept 5 mg.  Past Medical History:  Diagnosis Date  . Anginal pain ([place])    1990's; 09/28/2012  . Arthritis    "very mild" (09/28/2012)  . Coronary artery disease 09/27/2012   s/p PCI of the RCA with DES with remote PCI in [place] 15 to 20 years ago. 2. cath 02/2013 patent mid RCA stent, 30% left main, 70% mid to distaal tanden 70% lesions x 3, 60% mid diag, 50% ostial LCx, 40% mid LCx.   . Glaucoma   . Hyperlipidemia   . Hypothyroidism     Allergies  Allergen Reactions  . Other Other (See Comments)    Metals; "skin gets weepy; gets worse if it makes contact" (09/28/2012)  . Pollen Extract Other (See Comments)    "sneezing; watery eyes"  . Latex Itching and Swelling    Skin swells  . Morphine And Related Rash  . Nickel Other (See Comments)    Causes weepiness (skin)  . Penicillins Rash    Has patient  had a PCN reaction causing immediate rash, facial/tongue/throat swelling, SOB or lightheadedness with hypotension: Yes Has patient had a PCN reaction causing severe rash involving mucus membranes or skin necrosis: Unk Has patient had a PCN reaction that required hospitalization: No  Has patient had a PCN reaction occurring within the last 10 years: No If all of the above answers are "NO", then may proceed with Cephalosporin use.     ROS Unable to perform 2/2 dementia.    Objective:    Blood pressure 128/60, pulse 78, temperature 98.2 F (36.8 C), temperature source Oral, height $RemoveBefo'5\' 2"'HeRMDnzUxIB$  (1.575 m), weight 120 lb 9.6 oz (54.7 kg), SpO2 97 %.  Gen. Pleasant, well-nourished, in no distress, normal affect   HEENT: [place]/AT, face symmetric, conjunctiva clear, no scleral icterus, PERRLA, EOMI, nares patent without drainage.  Normal external ears and canals b/l.  L TM dull/flat in appearance with sunburst like distribution on in TM.  No air-fluid level.  Full without erythema Lungs: no accessory muscle use, CTAB, no wheezes or rales Cardiovascular: RRR, no m/r/g, no peripheral edema Abdomen: BS present, soft, NT/ND Musculoskeletal: No deformities, no cyanosis or clubbing, normal tone Neuro:  A&Ox3, CN II-XII intact, normal gait Skin:  Warm, dry, intact, no  lesions/ rash   Wt Readings from Last 3 Encounters:  02/20/20 127 lb (57.6 kg)  12/13/18 126 lb (57.2 kg)  10/31/18 126 lb (57.2 kg)    Lab Results  Component Value Date   WBC 4.4 02/20/2020   HGB 13.8 02/20/2020   HCT 40.7 02/20/2020   PLT 248.0 02/20/2020   GLUCOSE 86 02/20/2020   CHOL 265 (H) 02/20/2020   TRIG 85.0 02/20/2020   HDL 86.40 02/20/2020   LDLDIRECT 127.7 01/14/2014   LDLCALC 161 (H) 02/20/2020   ALT 32 10/03/2018   AST 84 (H) 10/03/2018   NA 140 02/20/2020   K 4.1 02/20/2020   CL 101 02/20/2020   CREATININE 0.81 02/20/2020   BUN 17 02/20/2020   CO2 32 02/20/2020   TSH 8.79 (H) 04/23/2020   INR 1.0 03/02/2013    HGBA1C 5.7 02/20/2020    Assessment/Plan:  Dementia without behavioral disturbance, unspecified dementia type ([place]) -stable -Patient looks good/doing well since living with daughter.  -CT head with and without contrast 01/17/2018 normal for age. Atherosclerosis noted.   -no recent imaging. -Consider vascular versus Alzheimer's dementia. -The past labs for reversible causes of pain. We will repeat -Patient encouraged to take Synthroid consistently -Given Rx for donepezil 5 mg however patient never started medication. -Continue to monitor -[facility] - Plan: TSH, Lipid panel, Vitamin B12, Vitamin D, 25-hydroxy, Hemoglobin A1c, CMP with eGFR([facility]), CMP with eGFR([facility]), Hemoglobin A1c, Vitamin D, 25-hydroxy, Vitamin B12, Lipid panel, TSH  Acquired hypothyroidism -Continue Synthroid 137 MCG -Discussed taking medicine every morning 30 minutes - 1 hour prior to taking other meds or eating food -We will recheck TSH - Plan: TSH  Right hand pain  -Resolved -Likely 2/2 to increase inflammation after multiple fleabites and history of arthritis Follow-up with Ortho as needed - Plan: CBC with Differential/Platelet, CBC with Differential/Platelet, Uric Acid, Uric Acid  Arthritis -Stable -Recent exacerbation 2/2 fleabites causing inflammation s/p prednisone course -Continue supportive care -Continue follow-up with Ortho. - Plan: CBC with Differential/Platelet, CBC with Differential/Platelet, Uric Acid, Uric Acid  Mixed hyperlipidemia -Continue Lipitor 20 mg daily -Discussed lifestyle modifications -Plan: Lipid panel  Blood pressure check -stable  -noted to be elevated in ED, likely 2/2 pain -Currently controlled at office. -Lifestyle modifications including low-sodium diet encouraged -We will continue to monitor -For continued elevation will start low-dose blood pressure medicine  Coronary artery disease involving native coronary artery of  native heart with angina pectoris (HCC) -Stable -Continue aspirin 81 mg daily the patient is likely taking as she does not like to take -Nitrostat sublingual as needed  F/u in 1 month to recheck bp  Grier Mitts, MD

## 2020-09-25 NOTE — Patient Instructions (Signed)
Arthritis Arthritis is a term that is commonly used to refer to joint pain or joint disease. There are more than 100 types of arthritis. What are the causes? The most common cause of this condition is wear and tear of a joint. Other causes include:  Gout.  Inflammation of a joint.  An infection of a joint.  Sprains and other injuries near the joint.  A reaction to medicines or drugs, or an allergic reaction. In some cases, the cause may not be known. What are the signs or symptoms? The main symptom of this condition is pain in the joint during movement. Other symptoms include:  Redness, swelling, or stiffness at a joint.  Warmth coming from the joint.  Fever.  Overall feeling of illness. How is this diagnosed? This condition may be diagnosed with a physical exam and tests, including:  Blood tests.  Urine tests.  Imaging tests, such as X-rays, an MRI, or a CT scan. Sometimes, fluid is removed from a joint for testing. How is this treated? This condition may be treated with:  Treatment of the cause, if it is known.  Rest.  Raising (elevating) the joint.  Applying cold or hot packs to the joint.  Medicines to improve symptoms and reduce inflammation.  Injections of a steroid such as cortisone into the joint to help reduce pain and inflammation. Depending on the cause of your arthritis, you may need to make lifestyle changes to reduce stress on your joint. Changes may include:  Exercising more.  Losing weight. Follow these instructions at home: Medicines  Take over-the-counter and prescription medicines only as told by your health care provider.  Do not take aspirin to relieve pain if your health care provider thinks that gout may be causing your pain. Activity  Rest your joint if told by your health care provider. Rest is important when your disease is active and your joint feels painful, swollen, or stiff.  Avoid activities that make the pain worse. It is  important to balance activity with rest.  Exercise your joint regularly with range-of-motion exercises as told by your health care provider. Try doing low-impact exercise, such as: ? Swimming. ? Water aerobics. ? Biking. ? Walking. Managing pain, stiffness, and swelling      If directed, put ice on the joint. ? Put ice in a plastic bag. ? Place a towel between your skin and the bag. ? Leave the ice on for 20 minutes, 2-3 times per day.  If your joint is swollen, raise (elevate) it above the level of your heart if directed by your health care provider.  If your joint feels stiff in the morning, try taking a warm shower.  If directed, apply heat to the affected area as often as told by your health care provider. Use the heat source that your health care provider recommends, such as a moist heat pack or a heating pad. If you have diabetes, do not apply heat without permission from your health care provider. To apply heat: ? Place a towel between your skin and the heat source. ? Leave the heat on for 20-30 minutes. ? Remove the heat if your skin turns bright red. This is especially important if you are unable to feel pain, heat, or cold. You may have a greater risk of getting burned. General instructions  Do not use any products that contain nicotine or tobacco, such as cigarettes, e-cigarettes, and chewing tobacco. If you need help quitting, ask your health care provider.  Keep   all follow-up visits as told by your health care provider. This is important. Contact a health care provider if:  The pain gets worse.  You have a fever. Get help right away if:  You develop severe joint pain, swelling, or redness.  Many joints become painful and swollen.  You develop severe back pain.  You develop severe weakness in your leg.  You cannot control your bladder or bowels. Summary  Arthritis is a term that is commonly used to refer to joint pain or joint disease. There are more than  100 types of arthritis.  The most common cause of this condition is wear and tear of a joint. Other causes include gout, inflammation or infection of the joint, sprains, or allergies.  Symptoms of this condition include redness, swelling, or stiffness of the joint. Other symptoms include warmth, fever, or feeling ill.  This condition is treated with rest, elevation, medicines, and applying cold or hot packs.  Follow your health care provider's instructions about medicines, activity, exercises, and other home care treatments. This information is not intended to replace advice given to you by your health care provider. Make sure you discuss any questions you have with your health care provider. Document Revised: 10/23/2018 Document Reviewed: 10/23/2018 Elsevier Patient Education  2020 [employer] Inc[employer]  Atherosclerosis  Atherosclerosis is narrowing and hardening of the arteries. Arteries are blood vessels that carry blood from the heart to all parts of the body. This blood contains oxygen. Arteries can become narrow or clogged with a buildup of fat, cholesterol, calcium, and other substances (plaque). Plaque decreases the amount of blood that can flow through the artery. Atherosclerosis can affect any artery in the body, including:  Heart arteries (coronary artery disease). This may cause a heart attack.  Brain arteries. This may cause a stroke (cerebrovascular accident).  Leg, arm, and pelvis arteries (peripheral artery disease). This may cause pain and numbness.  Kidney arteries. This may cause kidney (renal) failure. Treatment may slow the disease and prevent further damage to the heart, brain, peripheral arteries, and kidneys. What are the causes? Atherosclerosis develops slowly over many years. The inner layers of your arteries become damaged and allow the gradual buildup of plaque. The exact cause of atherosclerosis is not fully understood. Symptoms of atherosclerosis do not occur until  the artery becomes narrow or blocked. What increases the risk? The following factors may make you more likely to develop this condition:  High blood pressure.  High cholesterol.  Being middle-aged or older.  Having a family history of atherosclerosis.  Having high blood fats (triglycerides).  Diabetes.  Being overweight.  Smoking tobacco.  Not exercising enough (sedentary lifestyle).  Having a substance in the blood called C-reactive protein (CRP). This is a sign of increased levels of inflammation in the body.  Sleep apnea.  Being stressed.  Drinking too much alcohol. What are the signs or symptoms? This condition may not cause any symptoms. If you have symptoms, they are caused by damage to an area of your body that is not getting enough blood.  Coronary artery disease may cause chest pain and shortness of breath.  Decreased blood supply to your brain may cause a stroke. Signs of a stroke may include sudden: ? Weakness on one side of the body. ? Confusion. ? Changes in vision. ? Inability to speak or understand speech. ? Loss of balance, coordination, or the ability to walk. ? Severe headache. ? Loss of consciousness.  Peripheral arterial disease may cause pain and  numbness, often in the legs and hips.  Renal failure may cause fatigue, nausea, swelling, and itchy skin. How is this diagnosed? This condition is diagnosed based on your medical history and a physical exam. During the exam:  Your health care provider will: ? Check your pulse in different places. ? Listen for a "whooshing" sound over your arteries (bruit).  You may have tests, such as: ? Blood tests to check your levels of cholesterol, triglycerides, and CRP. ? Electrocardiogram (ECG) to check for heart damage. ? Chest X-ray to see if you have an enlarged heart, which is a sign of heart failure. ? Stress test to see how your heart reacts to exercise. ? Echocardiogram to get images of the inside  of your heart. ? Ankle-brachial index to compare blood pressure in your arms to blood pressure in your ankles. ? Ultrasound of your peripheral arteries to check blood flow. ? CT scan to check for damage to your heart or brain. ? X-rays of blood vessels after dye has been injected (angiogram) to check blood flow. How is this treated? Treatment starts with lifestyle changes, which may include:  Changing your diet.  Losing weight.  Reducing stress.  Exercising and being physically active more regularly.  Not smoking. You may also need medicine to:  Lower triglycerides and cholesterol.  Control blood pressure.  Prevent blood clots.  Lower inflammation in your body.  Control your blood sugar. Sometimes, surgery is needed to:  Remove plaque from an artery (endarterectomy).  Open or widen a narrowed heart artery (angioplasty).  Create a new path for your blood with one of these procedures: ? Heart (coronary) artery bypass graft surgery. ? Peripheral artery bypass graft surgery. Follow these instructions at home: Eating and drinking   Eat a heart-healthy diet. Talk with your health care provider or a diet and nutrition specialist (dietitian) if you need help. A heart-healthy diet involves: ? Limiting unhealthy fats and increasing healthy fats. Some examples of healthy fats are olive oil and canola oil. ? Eating plant-based foods, such as fruits, vegetables, nuts, whole grains, and legumes (such as peas and lentils).  Limit alcohol intake to no more than 1 drink a day for nonpregnant women and 2 drinks a day for men. One drink equals 12 oz of beer, 5 oz of wine, or 1 oz of hard liquor. Lifestyle  Follow an exercise program as told by your health care provider.  Maintain a healthy weight. Lose weight if your health care provider says that you need to do that.  Rest when you are tired.  Learn to manage your stress.  Do not use any products that contain nicotine or  tobacco, such as cigarettes and e-cigarettes. If you need help quitting, ask your health care provider.  Do not abuse drugs. General instructions  Take over-the-counter and prescription medicines only as told by your health care provider.  Manage other health conditions as told by your health care provider.  Keep all follow-up visits as told by your health care provider. This is important. Contact a health care provider if:  You have chest pain or discomfort. This includes squeezing chest pain that may feel like indigestion (angina).  You have shortness of breath.  You have an irregular heartbeat.  You have unexplained fatigue.  You have unexplained pain or numbness in an arm, leg, or hip.  You have nausea, swelling of your hands or feet, and itchy skin. Get help right away if:  You have any symptoms of  a heart attack, such as: ? Chest pain. ? Shortness of breath. ? Pain in your neck, jaw, arms, back, or stomach. ? Cold sweat. ? Nausea. ? Light-headedness.  You have any symptoms of a stroke. "BE FAST" is an easy way to remember the main warning signs of a stroke: ? B - Balance. Signs are dizziness, sudden trouble walking, or loss of balance. ? E - Eyes. Signs are trouble seeing or a sudden change in vision. ? F - Face. Signs are sudden weakness or numbness of the face, or the face or eyelid drooping on one side. ? A - Arms. Signs are weakness or numbness in an arm. This happens suddenly and usually on one side of the body. ? S - Speech. Signs are sudden trouble speaking, slurred speech, or trouble understanding what people say. ? T - Time. Time to call emergency services. Write down what time symptoms started.  You have other signs of a stroke, such as: ? A sudden, severe headache with no known cause. ? Nausea or vomiting. ? Seizure. These symptoms may represent a serious problem that is an emergency. Do not wait to see if the symptoms will go away. Get medical help right  away. Call your local emergency services (911 in the [place] Do not drive yourself to the hospital. Summary  Atherosclerosis is narrowing and hardening of the arteries.  Arteries can become narrow or clogged with a buildup of fat, cholesterol, calcium, and other substances (plaque).  This condition may not cause any symptoms. If you do have symptoms, they are caused by damage to an area of your body that is not getting enough blood.  Treatment may include lifestyle changes and medicines. In some cases, surgery is needed. This information is not intended to replace advice given to you by your health care provider. Make sure you discuss any questions you have with your health care provider. Document Revised: 02/24/2018 Document Reviewed: 07/21/2017 Elsevier Patient Education  2020 [employer].

## 2020-09-26 ENCOUNTER — Other Ambulatory Visit: Payer: Self-pay | Admitting: Family Medicine

## 2020-09-26 DIAGNOSIS — E559 Vitamin D deficiency, unspecified: Secondary | ICD-10-CM

## 2020-09-26 LAB — TSH: TSH: 0.28 mIU/L — ABNORMAL LOW (ref 0.40–4.50)

## 2020-09-26 LAB — URIC ACID: Uric Acid, Serum: 4.6 mg/dL (ref 2.5–7.0)

## 2020-09-26 LAB — CBC WITH DIFFERENTIAL/PLATELET
Absolute Monocytes: 480 cells/uL (ref 200–950)
Basophils Absolute: 30 cells/uL (ref 0–200)
Basophils Relative: 0.5 %
Eosinophils Absolute: 138 cells/uL (ref 15–500)
Eosinophils Relative: 2.3 %
HCT: 43.7 % (ref 35.0–45.0)
Hemoglobin: 14.8 g/dL (ref 11.7–15.5)
Lymphs Abs: 1590 cells/uL (ref 850–3900)
MCH: 30.9 pg (ref 27.0–33.0)
MCHC: 33.9 g/dL (ref 32.0–36.0)
MCV: 91.2 fL (ref 80.0–100.0)
MPV: 10.9 fL (ref 7.5–12.5)
Monocytes Relative: 8 %
Neutro Abs: 3762 cells/uL (ref 1500–7800)
Neutrophils Relative %: 62.7 %
Platelets: 326 10*3/uL (ref 140–400)
RBC: 4.79 10*6/uL (ref 3.80–5.10)
RDW: 13.4 % (ref 11.0–15.0)
Total Lymphocyte: 26.5 %
WBC: 6 10*3/uL (ref 3.8–10.8)

## 2020-09-26 LAB — COMPLETE METABOLIC PANEL WITH GFR
AG Ratio: 1.6 (calc) (ref 1.0–2.5)
ALT: 13 U/L (ref 6–29)
AST: 19 U/L (ref 10–35)
Albumin: 4.6 g/dL (ref 3.6–5.1)
Alkaline phosphatase (APISO): 79 U/L (ref 37–153)
BUN: 22 mg/dL (ref 7–25)
CO2: 31 mmol/L (ref 20–32)
Calcium: 9.9 mg/dL (ref 8.6–10.4)
Chloride: 102 mmol/L (ref 98–110)
Creat: 0.76 mg/dL (ref 0.60–0.88)
GFR, Est African American: 82 mL/min/{1.73_m2} (ref >=60)
GFR, Est Non African American: 71 mL/min/{1.73_m2} (ref >=60)
Globulin: 2.8 g/dL (calc) (ref 1.9–3.7)
Glucose, Bld: 84 mg/dL (ref 65–99)
Potassium: 4.9 mmol/L (ref 3.5–5.3)
Sodium: 138 mmol/L (ref 135–146)
Total Bilirubin: 0.5 mg/dL (ref 0.2–1.2)
Total Protein: 7.4 g/dL (ref 6.1–8.1)

## 2020-09-26 LAB — LIPID PANEL
Cholesterol: 233 mg/dL — ABNORMAL HIGH (ref <200)
HDL: 89 mg/dL (ref >=50)
LDL Cholesterol (Calc): 120 mg/dL (calc) — ABNORMAL HIGH
Non-HDL Cholesterol (Calc): 144 mg/dL (calc) — ABNORMAL HIGH (ref <130)
Total CHOL/HDL Ratio: 2.6 (calc) (ref <5.0)
Triglycerides: 129 mg/dL (ref <150)

## 2020-09-26 LAB — VITAMIN D 25 HYDROXY (VIT D DEFICIENCY, FRACTURES): Vit D, 25-Hydroxy: 19 ng/mL — ABNORMAL LOW (ref 30–100)

## 2020-09-26 LAB — HEMOGLOBIN A1C
Hgb A1c MFr Bld: 5.4 % of total Hgb (ref <5.7)
Mean Plasma Glucose: 108 (calc)
eAG (mmol/L): 6 (calc)

## 2020-09-26 LAB — VITAMIN B12: Vitamin B-12: 516 pg/mL (ref 200–1100)

## 2020-09-26 MED ORDER — VITAMIN D (ERGOCALCIFEROL) 1.25 MG (50000 UNIT) PO CAPS: 50000.0000 [IU] | ORAL_CAPSULE | ORAL | 0 refills | Status: DC

## 2020-11-03 DIAGNOSIS — Z20822 Contact with and (suspected) exposure to covid-19: Secondary | ICD-10-CM | POA: Diagnosis not present

## 2020-12-01 DIAGNOSIS — H4720 Unspecified optic atrophy: Secondary | ICD-10-CM | POA: Diagnosis not present

## 2020-12-01 DIAGNOSIS — H401132 Primary open-angle glaucoma, bilateral, moderate stage: Secondary | ICD-10-CM | POA: Diagnosis not present

## 2020-12-01 DIAGNOSIS — H31003 Unspecified chorioretinal scars, bilateral: Secondary | ICD-10-CM | POA: Diagnosis not present

## 2020-12-17 ENCOUNTER — Other Ambulatory Visit: Payer: Self-pay | Admitting: Family Medicine

## 2020-12-17 DIAGNOSIS — E559 Vitamin D deficiency, unspecified: Secondary | ICD-10-CM

## 2021-01-08 ENCOUNTER — Other Ambulatory Visit: Payer: Self-pay | Admitting: Family Medicine

## 2021-01-08 DIAGNOSIS — E039 Hypothyroidism, unspecified: Secondary | ICD-10-CM

## 2021-01-29 ENCOUNTER — Telehealth: Payer: Self-pay | Admitting: Family Medicine

## 2021-01-29 NOTE — Telephone Encounter (Signed)
Left message for patient to call back and schedule Medicare Annual Wellness Visit (AWV) either virtually or in office. No detailed message left   Last AWV 02/20/20  please schedule at anytime with [facility] Nurse Health Advisor 1 or 2   This should be a 45 minute visit.  Please schedule AWVS after 02/20/21

## 2021-02-03 ENCOUNTER — Other Ambulatory Visit: Payer: Self-pay | Admitting: Family Medicine

## 2021-02-03 DIAGNOSIS — E559 Vitamin D deficiency, unspecified: Secondary | ICD-10-CM

## 2021-02-18 ENCOUNTER — Encounter: Payer: Medicare Other | Admitting: Family Medicine

## 2021-02-25 ENCOUNTER — Other Ambulatory Visit: Payer: Self-pay

## 2021-02-25 ENCOUNTER — Ambulatory Visit (INDEPENDENT_AMBULATORY_CARE_PROVIDER_SITE_OTHER): Payer: Medicare Other | Admitting: Family Medicine

## 2021-02-25 ENCOUNTER — Encounter: Payer: Self-pay | Admitting: Family Medicine

## 2021-02-25 VITALS — BP 140/64 | HR 84 | Temp 97.5°F | Ht 63.0 in | Wt 136.0 lb

## 2021-02-25 DIAGNOSIS — E039 Hypothyroidism, unspecified: Secondary | ICD-10-CM

## 2021-02-25 DIAGNOSIS — E782 Mixed hyperlipidemia: Secondary | ICD-10-CM

## 2021-02-25 DIAGNOSIS — Z0001 Encounter for general adult medical examination with abnormal findings: Secondary | ICD-10-CM | POA: Diagnosis not present

## 2021-02-25 DIAGNOSIS — Z780 Asymptomatic menopausal state: Secondary | ICD-10-CM

## 2021-02-25 DIAGNOSIS — F0390 Unspecified dementia without behavioral disturbance: Secondary | ICD-10-CM | POA: Diagnosis not present

## 2021-02-25 DIAGNOSIS — I2510 Atherosclerotic heart disease of native coronary artery without angina pectoris: Secondary | ICD-10-CM | POA: Diagnosis not present

## 2021-02-25 DIAGNOSIS — M7061 Trochanteric bursitis, right hip: Secondary | ICD-10-CM

## 2021-02-25 LAB — CBC WITH DIFFERENTIAL/PLATELET
Basophils Absolute: 0 10*3/uL (ref 0.0–0.1)
Basophils Relative: 0.8 % (ref 0.0–3.0)
Eosinophils Absolute: 0.1 10*3/uL (ref 0.0–0.7)
Eosinophils Relative: 1.3 % (ref 0.0–5.0)
HCT: 43.3 % (ref 36.0–46.0)
Hemoglobin: 14.7 g/dL (ref 12.0–15.0)
Lymphocytes Relative: 24.5 % (ref 12.0–46.0)
Lymphs Abs: 1.1 10*3/uL (ref 0.7–4.0)
MCHC: 33.8 g/dL (ref 30.0–36.0)
MCV: 89.5 fl (ref 78.0–100.0)
Monocytes Absolute: 0.2 10*3/uL (ref 0.1–1.0)
Monocytes Relative: 5.6 % (ref 3.0–12.0)
Neutro Abs: 2.9 10*3/uL (ref 1.4–7.7)
Neutrophils Relative %: 67.8 % (ref 43.0–77.0)
Platelets: 286 10*3/uL (ref 150.0–400.0)
RBC: 4.84 Mil/uL (ref 3.87–5.11)
RDW: 14.4 % (ref 11.5–15.5)
WBC: 4.3 10*3/uL (ref 4.0–10.5)

## 2021-02-25 LAB — BASIC METABOLIC PANEL
BUN: 19 mg/dL (ref 6–23)
CO2: 30 mEq/L (ref 19–32)
Calcium: 9.6 mg/dL (ref 8.4–10.5)
Chloride: 101 mEq/L (ref 96–112)
Creatinine, Ser: 0.79 mg/dL (ref 0.40–1.20)
GFR: 67.58 mL/min (ref >60.00)
Glucose, Bld: 117 mg/dL — ABNORMAL HIGH (ref 70–99)
Potassium: 4.1 mEq/L (ref 3.5–5.1)
Sodium: 140 mEq/L (ref 135–145)

## 2021-02-25 LAB — LIPID PANEL
Cholesterol: 284 mg/dL — ABNORMAL HIGH (ref 0–200)
HDL: 94.3 mg/dL (ref >39.00)
LDL Cholesterol: 150 mg/dL — ABNORMAL HIGH (ref 0–99)
NonHDL: 190.04
Total CHOL/HDL Ratio: 3
Triglycerides: 199 mg/dL — ABNORMAL HIGH (ref 0.0–149.0)
VLDL: 39.8 mg/dL (ref 0.0–40.0)

## 2021-02-25 LAB — TSH: TSH: 4.73 u[IU]/mL — ABNORMAL HIGH (ref 0.35–4.50)

## 2021-02-25 LAB — VITAMIN B12: Vitamin B-12: 535 pg/mL (ref 211–911)

## 2021-02-25 LAB — HEMOGLOBIN A1C: Hgb A1c MFr Bld: 5.6 % (ref 4.6–6.5)

## 2021-02-25 LAB — T4, FREE: Free T4: 1.03 ng/dL (ref 0.60–1.60)

## 2021-02-25 LAB — VITAMIN D 25 HYDROXY (VIT D DEFICIENCY, FRACTURES): VITD: 36.55 ng/mL (ref 30.00–100.00)

## 2021-02-25 NOTE — Progress Notes (Signed)
Subjective:  Patient accompanied by her daughter   Sabrina Hart is a 87 y.o. female and is here for a comprehensive physical exam. The patient reports no problems.  Per patient she is doing well and feeling good.  Patient notes staying [facility].  Patient complains about the food there however. Per pt's daughter, pt with occasional R ankle, shin, and lateral thigh pain.  Noted after increased walking from the house to the garden.  Pt wearing older slip on sneakers.  Denies edema or pain in calf.  Social History   Socioeconomic History  . Marital status: Single    Spouse name: Not on file  . Number of children: Not on file  . Years of education: Not on file  . Highest education level: Not on file  Occupational History  . Not on file  Tobacco Use  . Smoking status: Never Smoker  . Smokeless tobacco: Never Used  Vaping Use  . Vaping Use: Never used  Substance and Sexual Activity  . Alcohol use: Yes    Alcohol/week: 1.0 standard drink    Types: 1 Glasses of wine per week    Comment: 09/28/2012 "1 glass of wine averages once/wk; beer average once/week during summer w/friends"  . Drug use: No  . Sexual activity: Never  Other Topics Concern  . Not on file  Social History Narrative  . Not on file   Social Determinants of Health   Financial Resource Strain: Not on file  Food Insecurity: Not on file  Transportation Needs: Not on file  Physical Activity: Not on file  Stress: Not on file  Social Connections: Not on file  Intimate Partner Violence: Not on file   Health Maintenance  Topic Date Due  . Janet [place]  Never done  . DEXA SCAN  Never done  . PNA vac Low Risk Adult (1 of 2 - PCV13) Never done  . INFLUENZA VACCINE  Never done  . COVID-19 Vaccine (3 - Booster for Pfizer series) 01/28/2021  . HPV VACCINES  Aged Out    The following portions of the patient's history were reviewed and updated as appropriate: allergies, current  medications, past family history, past medical history, past social history, past surgical history and problem list.  Review of Systems Review of systems not obtained due to patient factors.   H/o Dementia.  Objective:    BP 140/64 (BP Location: Left Arm, Patient Position: Sitting, Cuff Size: Normal)   Pulse 84   Temp (!) 97.5 F (36.4 C) (Oral)   Ht 5\' 3"  (1.6 m)   Wt 136 lb (61.7 kg)   SpO2 96%   BMI 24.09 kg/m  General appearance: alert, cooperative and no distress Head: Normocephalic, without obvious abnormality, atraumatic Eyes: conjunctivae/corneas clear. PERRL, EOM's intact. Fundi benign. Ears: normal TM's and external ear canals both ears Nose: Nares normal. Septum midline. Mucosa normal. No drainage or sinus tenderness. Throat: lips, mucosa, and tongue normal; teeth and gums normal Neck: no adenopathy, no carotid bruit, no JVD, supple, symmetrical, trachea midline and thyroid not enlarged, symmetric, no tenderness/mass/nodules Lungs: clear to auscultation bilaterally Heart: regular rate and rhythm, S1, S2 normal, no murmur, click, rub or gallop Abdomen: soft, non-tender; bowel sounds normal; no masses,  no organomegaly Extremities: extremities normal, atraumatic, no cyanosis or edema and trace edema of b/l ankles and shins.  TTP of R trochanteric bursa.  No IT band tightness., no calf tenderness or edema b/l. Pulses: 2+ and symmetric Skin:  Skin color, texture, turgor normal. No rashes or lesions Lymph nodes: Cervical, supraclavicular, and axillary nodes normal. Neurologic: A&Ox 2 (person and place), CN II through XII grossly intact, normal strength and tone   Assessment:    Healthy female exam.   Plan:     Anticipatory guidance given including wearing seatbelts, smoke detectors in the home, increasing physical activity, increasing p.o. intake of water and vegetables. -will obtain labs -Discussed health maintenance and prevention -We will order bone density scan as  never done -Given handout -Next CPE in 1 year See After Visit Summary for Counseling Recommendations    Trochanteric bursitis of right hip  - Plan: CBC with Differential/Platelet  Acquired hypothyroidism  - Plan: TSH, T4, Free  Mixed hyperlipidemia  - Plan: Lipid panel  Dementia without behavioral disturbance, unspecified dementia type (HCC)  -Short-term memory seems slightly improved -continue regular exercise and compliance with medications - Plan: Basic metabolic panel, TSH, T4, Free, Hemoglobin A1c, Vitamin D, 25-hydroxy, Vitamin B12  Coronary artery disease involving native coronary artery of native heart without angina pectoris -Lifestyle modification  Asymptomatic postmenopausal state -Plan: Bone density  F/u prn  Abbe Amsterdam, MD

## 2021-02-25 NOTE — Patient Instructions (Addendum)
vi Health Maintenance After Age 85 After age 8965, you are at a higher risk for certain long-term diseases and infections as well as injuries from falls. Falls are a major cause of broken bones and head injuries in people who are older than age 85. Getting regular preventive care can help to keep you healthy and well. Preventive care includes getting regular testing and making lifestyle changes as recommended by your health care provider. Talk with your health care provider about:  Which screenings and tests you should have. A screening is a test that checks for a disease when you have no symptoms.  A diet and exercise plan that is right for you. What should I know about screenings and tests to prevent falls? Screening and testing are the best ways to find a health problem early. Early diagnosis and treatment give you the best chance of managing medical conditions that are common after age 85. Certain conditions and lifestyle choices may make you more likely to have a fall. Your health care provider may recommend:  Regular vision checks. Poor vision and conditions such as cataracts can make you more likely to have a fall. If you wear glasses, make sure to get your prescription updated if your vision changes.  Medicine review. Work with your health care provider to regularly review all of the medicines you are taking, including over-the-counter medicines. Ask your health care provider about any side effects that may make you more likely to have a fall. Tell your health care provider if any medicines that you take make you feel dizzy or sleepy.  Osteoporosis screening. Osteoporosis is a condition that causes the bones to get weaker. This can make the bones weak and cause them to break more easily.  Blood pressure screening. Blood pressure changes and medicines to control blood pressure can make you feel dizzy.  Strength and balance checks. Your health care provider may recommend certain tests to check  your strength and balance while standing, walking, or changing positions.  Foot health exam. Foot pain and numbness, as well as not wearing proper footwear, can make you more likely to have a fall.  Depression screening. You may be more likely to have a fall if you have a fear of falling, feel emotionally low, or feel unable to do activities that you used to do.  Alcohol use screening. Using too much alcohol can affect your balance and may make you more likely to have a fall. What actions can I take to lower my risk of falls? General instructions  Talk with your health care provider about your risks for falling. Tell your health care provider if: ? You fall. Be sure to tell your health care provider about all falls, even ones that seem minor. ? You feel dizzy, sleepy, or off-balance.  Take over-the-counter and prescription medicines only as told by your health care provider. These include any supplements.  Eat a healthy diet and maintain a healthy weight. A healthy diet includes low-fat dairy products, low-fat (lean) meats, and fiber from whole grains, beans, and lots of fruits and vegetables. Home safety  Remove any tripping hazards, such as rugs, cords, and clutter.  Install safety equipment such as grab bars in bathrooms and safety rails on stairs.  Keep rooms and walkways well-lit. Activity  Follow a regular exercise program to stay fit. This will help you maintain your balance. Ask your health care provider what types of exercise are appropriate for you.  If you need a cane or  walker, use it as recommended by your health care provider.  Wear supportive shoes that have nonskid soles.   Lifestyle  Do not drink alcohol if your health care provider tells you not to drink.  If you drink alcohol, limit how much you have: ? 0-1 drink a day for women. ? 0-2 drinks a day for men.  Be aware of how much alcohol is in your drink. In the [place] one drink equals one typical bottle of  beer (12 oz), one-half glass of wine (5 oz), or one shot of hard liquor (1 oz).  Do not use any products that contain nicotine or tobacco, such as cigarettes and e-cigarettes. If you need help quitting, ask your health care provider. Summary  Having a healthy lifestyle and getting preventive care can help to protect your health and wellness after age 68.  Screening and testing are the best way to find a health problem early and help you avoid having a fall. Early diagnosis and treatment give you the best chance for managing medical conditions that are more common for people who are older than age 36.  Falls are a major cause of broken bones and head injuries in people who are older than age 49. Take precautions to prevent a fall at home.  Work with your health care provider to learn what changes you can make to improve your health and wellness and to prevent falls. This information is not intended to replace advice given to you by your health care provider. Make sure you discuss any questions you have with your health care provider. Document Revised: 03/08/2019 Document Reviewed: 09/28/2017 Elsevier Patient Education  2021 [employer] Inc.  Hypothyroidism  Hypothyroidism is when the thyroid gland does not make enough of certain hormones (it is underactive). The thyroid gland is a small gland located in the lower front part of the neck, just in front of the windpipe (trachea). This gland makes hormones that help control how the body uses food for energy (metabolism) as well as how the heart and brain function. These hormones also play a role in keeping your bones strong. When the thyroid is underactive, it produces too little of the hormones thyroxine (T4) and triiodothyronine (T3). What are the causes? This condition may be caused by:  Hashimoto's disease. This is a disease in which the body's disease-fighting system (immune system) attacks the thyroid gland. This is the most common  cause.  Viral infections.  Pregnancy.  Certain medicines.  Birth defects.  Past radiation treatments to the head or neck for cancer.  Past treatment with radioactive iodine.  Past exposure to radiation in the environment.  Past surgical removal of part or all of the thyroid.  Problems with a gland in the center of the brain (pituitary gland).  Lack of enough iodine in the diet. What increases the risk? You are more likely to develop this condition if:  You are female.  You have a family history of thyroid conditions.  You use a medicine called lithium.  You take medicines that affect the immune system (immunosuppressants). What are the signs or symptoms? Symptoms of this condition include:  Feeling as though you have no energy (lethargy).  Not being able to tolerate cold.  Weight gain that is not explained by a change in diet or exercise habits.  Lack of appetite.  Dry skin.  Coarse hair.  Menstrual irregularity.  Slowing of thought processes.  Constipation.  Sadness or depression. How is this diagnosed? This condition may  be diagnosed based on:  Your symptoms, your medical history, and a physical exam.  Blood tests. You may also have imaging tests, such as an ultrasound or MRI. How is this treated? This condition is treated with medicine that replaces the thyroid hormones that your body does not make. After you begin treatment, it may take several weeks for symptoms to go away. Follow these instructions at home:  Take over-the-counter and prescription medicines only as told by your health care provider.  If you start taking any new medicines, tell your health care provider.  Keep all follow-up visits as told by your health care provider. This is important. ? As your condition improves, your dosage of thyroid hormone medicine may change. ? You will need to have blood tests regularly so that your health care provider can monitor your  condition. Contact a health care provider if:  Your symptoms do not get better with treatment.  You are taking thyroid hormone replacement medicine and you: ? Sweat a lot. ? Have tremors. ? Feel anxious. ? Lose weight rapidly. ? Cannot tolerate heat. ? Have emotional swings. ? Have diarrhea. ? Feel weak. Get help right away if you have:  Chest pain.  An irregular heartbeat.  A rapid heartbeat.  Difficulty breathing. Summary  Hypothyroidism is when the thyroid gland does not make enough of certain hormones (it is underactive).  When the thyroid is underactive, it produces too little of the hormones thyroxine (T4) and triiodothyronine (T3).  The most common cause is Hashimoto's disease, a disease in which the body's disease-fighting system (immune system) attacks the thyroid gland. The condition can also be caused by viral infections, medicine, pregnancy, or past radiation treatment to the head or neck.  Symptoms may include weight gain, dry skin, constipation, feeling as though you do not have energy, and not being able to tolerate cold.  This condition is treated with medicine to replace the thyroid hormones that your body does not make. This information is not intended to replace advice given to you by your health care provider. Make sure you discuss any questions you have with your health care provider. Document Revised: 08/15/2020 Document Reviewed: 07/31/2020 Elsevier Patient Education  2021 [employer] Inc.  Hip Bursitis  Hip bursitis is swelling of one or more fluid-filled sacs (bursae) in your hip joint. This condition can cause pain, and your symptoms may come and go over time. What are the causes?  Repeated use of your hip muscles.  Injury to the hip.  Weak butt muscles.  Bone spurs.  Infection. In some cases, the cause may not be known. What increases the risk? You are more likely to develop this condition if:  You had a past hip injury or hip  surgery.  You have a condition, such as arthritis, gout, diabetes, or thyroid disease.  You have spine problems.  You have one leg that is shorter than the other.  You run a lot or do long-distance running.  You play sports where there is a risk of injury or falling, such as football, martial arts, or skiing. What are the signs or symptoms? Symptoms may come and go, and they often include:  Pain in the hip or groin area. Pain may get worse when you move your hip.  Tenderness and swelling of the hip. In rare cases, the bursa may become infected. If this happens, you may get a fever, as well as have warmth and redness in the hip area. How is this treated? This  condition is treated by:  Resting your hip.  Icing your hip.  Wrapping the hip area with an elastic bandage (compression wrap).  Keeping the hip raised. Other treatments may include medicine, draining fluid out of the bursa, or using crutches, a cane, or a walker. Surgery may be needed, but this is rare. Long-term treatment may include doing exercises to help your strength and flexibility. It may also include lifestyle changes like losing weight to lessen the strain on your hip. Follow these instructions at home: Managing pain, stiffness, and swelling  If told, put ice on the painful area. ? Put ice in a plastic bag. ? Place a towel between your skin and the bag. ? Leave the ice on for 20 minutes, 2-3 times a day.  Raise your hip by putting a pillow under your hips while you lie down. Stop if you feel pain.  If told, put heat on the affected area. Do this as often as told by your doctor. Use a moist heat pack or a heating pad as told by your doctor. ? Place a towel between your skin and the heat source. ? Leave the heat on for 20-30 minutes. ? Take off the heat if your skin turns bright red. This is very important if you are unable to feel pain, heat, or cold. You may have a greater risk of getting burned.       Activity  Do not use your hip to support your body weight until your doctor says that you can.  Use crutches, a cane, or a walker as told by your doctor.  If the affected leg is one that you use to drive, ask your doctor if it is safe to drive.  Rest and protect your hip as much as you can until you feel better.  Return to your normal activities as told by your doctor. Ask your doctor what activities are safe for you.  Do exercises as told by your doctor. General instructions  Take over-the-counter and prescription medicines only as told by your doctor.  Gently rub and stretch your injured area as often as is comfortable.  Wear elastic bandages only as told by your doctor.  If one of your legs is shorter than the other, get fitted for a shoe insert or orthotic.  Keep a healthy weight. Follow instructions from your doctor.  Keep all follow-up visits as told by your doctor. This is important. How is this prevented?  Exercise regularly, as told by your doctor.  Wear the right shoes for the sport you play.  Warm up and stretch before being active. Cool down and stretch after being active.  Take breaks often from repeated activity.  Avoid activities that bother your hip or cause pain.  Avoid sitting down for a long time. Where to find more information  American [employer]: orthoinfo.aaos.org Contact a doctor if:  You have a fever.  You have new symptoms.  You have trouble walking or doing everyday activities.  You have pain that gets worse or does not get better with medicine.  Your skin around your hip is red.  You get a feeling of warmth in your hip area. Get help right away if:  You cannot move your hip.  You have very bad pain.  You cannot control the muscles in your feet. Summary  Hip bursitis is swelling of one or more fluid-filled sacs (bursae) in your hip joint.  Symptoms often come and go over time.  This  condition is  often treated by resting and icing the hip. It also may help to keep the area raised and wrapped in an elastic bandage. Other treatments may be needed. This information is not intended to replace advice given to you by your health care provider. Make sure you discuss any questions you have with your health care provider. Document Revised: 09/17/2019 Document Reviewed: 07/24/2018 Elsevier Patient Education  2021 [employer].

## 2021-02-26 ENCOUNTER — Encounter: Payer: Self-pay | Admitting: Family Medicine

## 2021-02-27 ENCOUNTER — Other Ambulatory Visit: Payer: Self-pay | Admitting: Family Medicine

## 2021-02-27 DIAGNOSIS — E039 Hypothyroidism, unspecified: Secondary | ICD-10-CM

## 2021-02-27 DIAGNOSIS — E782 Mixed hyperlipidemia: Secondary | ICD-10-CM

## 2021-02-27 MED ORDER — LEVOTHYROXINE SODIUM 150 MCG PO TABS: 150.0000 ug | ORAL_TABLET | Freq: Every day | ORAL | 3 refills | Status: DC

## 2021-02-27 MED ORDER — ATORVASTATIN CALCIUM 40 MG PO TABS: 40.0000 mg | ORAL_TABLET | Freq: Every day | ORAL | 4 refills | Status: DC

## 2021-03-03 NOTE — Progress Notes (Signed)
Spoke wit patients daughter, listed on FYI, is aware of results.

## 2021-03-23 ENCOUNTER — Telehealth: Payer: Self-pay | Admitting: Family Medicine

## 2021-03-23 NOTE — Telephone Encounter (Signed)
Patient's daughter Philipp Deputy would like Dr. Salomon Fick to give her a call. She thinks her mother is suffering from Schizophrenia.  She would like to know what specialist she needs to see and if Dr. Salomon Fick can recommend someone.   Please advise.

## 2021-03-25 ENCOUNTER — Ambulatory Visit (INDEPENDENT_AMBULATORY_CARE_PROVIDER_SITE_OTHER): Payer: Medicare Other | Admitting: Family Medicine

## 2021-03-25 ENCOUNTER — Other Ambulatory Visit: Payer: Self-pay

## 2021-03-25 ENCOUNTER — Ambulatory Visit (INDEPENDENT_AMBULATORY_CARE_PROVIDER_SITE_OTHER): Payer: Medicare Other

## 2021-03-25 ENCOUNTER — Encounter: Payer: Self-pay | Admitting: Family Medicine

## 2021-03-25 VITALS — BP 132/68 | HR 75 | Temp 97.6°F | Wt 138.8 lb

## 2021-03-25 DIAGNOSIS — M25551 Pain in right hip: Secondary | ICD-10-CM | POA: Diagnosis not present

## 2021-03-25 DIAGNOSIS — E039 Hypothyroidism, unspecified: Secondary | ICD-10-CM

## 2021-03-25 DIAGNOSIS — F0390 Unspecified dementia without behavioral disturbance: Secondary | ICD-10-CM | POA: Diagnosis not present

## 2021-03-25 DIAGNOSIS — R410 Disorientation, unspecified: Secondary | ICD-10-CM

## 2021-03-25 LAB — COMPREHENSIVE METABOLIC PANEL
ALT: 12 U/L (ref 0–35)
AST: 22 U/L (ref 0–37)
Albumin: 4.2 g/dL (ref 3.5–5.2)
Alkaline Phosphatase: 84 U/L (ref 39–117)
BUN: 20 mg/dL (ref 6–23)
CO2: 28 mEq/L (ref 19–32)
Calcium: 9.5 mg/dL (ref 8.4–10.5)
Chloride: 101 mEq/L (ref 96–112)
Creatinine, Ser: 0.89 mg/dL (ref 0.40–1.20)
GFR: 58.54 mL/min — ABNORMAL LOW (ref >60.00)
Glucose, Bld: 87 mg/dL (ref 70–99)
Potassium: 4.6 mEq/L (ref 3.5–5.1)
Sodium: 138 mEq/L (ref 135–145)
Total Bilirubin: 0.6 mg/dL (ref 0.2–1.2)
Total Protein: 7.1 g/dL (ref 6.0–8.3)

## 2021-03-25 LAB — CBC WITH DIFFERENTIAL/PLATELET
Basophils Absolute: 0 10*3/uL (ref 0.0–0.1)
Basophils Relative: 0.8 % (ref 0.0–3.0)
Eosinophils Absolute: 0.1 10*3/uL (ref 0.0–0.7)
Eosinophils Relative: 2.5 % (ref 0.0–5.0)
HCT: 41.1 % (ref 36.0–46.0)
Hemoglobin: 13.9 g/dL (ref 12.0–15.0)
Lymphocytes Relative: 26.2 % (ref 12.0–46.0)
Lymphs Abs: 1.1 10*3/uL (ref 0.7–4.0)
MCHC: 33.9 g/dL (ref 30.0–36.0)
MCV: 90.3 fl (ref 78.0–100.0)
Monocytes Absolute: 0.4 10*3/uL (ref 0.1–1.0)
Monocytes Relative: 9 % (ref 3.0–12.0)
Neutro Abs: 2.6 10*3/uL (ref 1.4–7.7)
Neutrophils Relative %: 61.5 % (ref 43.0–77.0)
Platelets: 263 10*3/uL (ref 150.0–400.0)
RBC: 4.55 Mil/uL (ref 3.87–5.11)
RDW: 14.5 % (ref 11.5–15.5)
WBC: 4.1 10*3/uL (ref 4.0–10.5)

## 2021-03-25 LAB — POCT URINALYSIS DIPSTICK
Bilirubin, UA: NEGATIVE
Blood, UA: NEGATIVE
Glucose, UA: NEGATIVE
Ketones, UA: NEGATIVE
Leukocytes, UA: NEGATIVE
Nitrite, UA: NEGATIVE
Protein, UA: NEGATIVE
Spec Grav, UA: 1.015 (ref 1.010–1.025)
Urobilinogen, UA: NEGATIVE E.U./dL — AB
pH, UA: 6 (ref 5.0–8.0)

## 2021-03-25 LAB — TSH: TSH: 1.36 u[IU]/mL (ref 0.35–4.50)

## 2021-03-25 NOTE — Patient Instructions (Addendum)
You can continue to use Tylenol as needed for pain/discomfort.  You can also try using Voltaren gel which can be found over-the-counter at your local drugstore, [employer], or [facility].  Further recommendations to be made based on results from imaging.  Hip Pain The hip is the joint between the upper legs and the lower pelvis. The bones, cartilage, tendons, and muscles of your hip joint support your body and allow you to move around. Hip pain can range from a minor ache to severe pain in one or both of your hips. The pain may be felt on the inside of the hip joint near the groin, or on the outside near the buttocks and upper thigh. You may also have swelling or stiffness in your hip area. Follow these instructions at home: Managing pain, stiffness, and swelling  If directed, put ice on the painful area. To do this: ? Put ice in a plastic bag. ? Place a towel between your skin and the bag. ? Leave the ice on for 20 minutes, 2-3 times a day.  If directed, apply heat to the affected area as often as told by your health care provider. Use the heat source that your health care provider recommends, such as a moist heat pack or a heating pad. ? Place a towel between your skin and the heat source. ? Leave the heat on for 20-30 minutes. ? Remove the heat if your skin turns bright red. This is especially important if you are unable to feel pain, heat, or cold. You may have a greater risk of getting burned.      Activity  Do exercises as told by your health care provider.  Avoid activities that cause pain. General instructions  Take over-the-counter and prescription medicines only as told by your health care provider.  Keep a journal of your symptoms. Write down: ? How often you have hip pain. ? The location of your pain. ? What the pain feels like. ? What makes the pain worse.  Sleep with a pillow between your legs on your most comfortable side.  Keep all follow-up visits as told by your health  care provider. This is important.   Contact a health care provider if:  You cannot put weight on your leg.  Your pain or swelling continues or gets worse after one week.  It gets harder to walk.  You have a fever. Get help right away if:  You fall.  You have a sudden increase in pain and swelling in your hip.  Your hip is red or swollen or very tender to touch. Summary  Hip pain can range from a minor ache to severe pain in one or both of your hips.  The pain may be felt on the inside of the hip joint near the groin, or on the outside near the buttocks and upper thigh.  Avoid activities that cause pain.  Write down how often you have hip pain, the location of the pain, what makes it worse, and what it feels like. This information is not intended to replace advice given to you by your health care provider. Make sure you discuss any questions you have with your health care provider. Document Revised: 04/02/2019 Document Reviewed: 04/02/2019 Elsevier Patient Education  2021 Else[employer] Inc.  Confusion Confusion is the inability to think with your usual speed or clarity. Confusion can be caused by many things. People who are confused often describe their thinking as cloudy or unclear. Confusion can also include feeling disoriented.  This means you are unaware of where you are or who you are. You may also not know the date or time. When confused, you may have trouble remembering, paying attention, or making decisions. Some people also act aggressively when they are confused. In some cases, confusion may come on quickly. In other cases, it may develop slowly over time. Confusion may be caused by medical conditions such as:  Infections, such as a urinary tract infection (UTI).  Low levels of oxygen, which can develop from conditions such as long-term lung disorders.  Decrease in brain function due to dementia and other conditions that affect the brain, such as seizures, strokes, brain  tumors, or head injuries.  Mental health conditions, like panic attacks, anxiety, depression, and hallucinations. Confusion may also be caused by physical factors such as:  Loss of fluid (dehydration) or an imbalance of salts and minerals in the body (electrolytes).  Lack of certain nutrients like niacin, thiamine, or other B vitamins.  Fever or hypothermia, which is a sudden drop in body temperature.  Low or high blood sugar.  Low or high blood pressure. Other causes include:  Lack of sleep or changes in routine or surroundings, such as when traveling or staying in a hospital.  Using too much alcohol, drugs, or medicine.  Side effects of medicines, or taking medicines that affect other medicines (drug interactions). Follow these instructions at home: Pay attention to your symptoms. Tell your health care provider about any changes or if you develop new symptoms. Follow these instructions to control or treat symptoms. Ask a family member or friend for help if needed. Medicines  Take over-the-counter and prescription medicines only as told by your health care provider.  Ask your health care provider about changing or stopping any medicines that may be causing your confusion.  Avoid pain medicines or sleep medicines until you have fully recovered.  Use a pillbox or an alarm to help you take the right medicines at the right time.   Lifestyle  Eat a balanced diet that includes fruits and vegetables.  Get enough sleep. For most adults, this is 7-9 hours each night.  Do not drink alcohol.  Do not become isolated. Spend time with other people and make plans for your days.  Do not drive until your health care provider says that it is safe to do so.  Do not use any products that contain nicotine or tobacco, such as cigarettes, e-cigarettes, and chewing tobacco. If you need help quitting, ask your health care provider.  Stop other activities that may increase your chances of  getting hurt. These may include some work duties, sports activities, swimming, or bike riding. Ask your health care provider what activities are safe for you.   Tips for caregivers  Find out if the person is confused. Ask the person to state his or her name, age, and the date. If the person is unsure or answers incorrectly, he or she may be confused and need assistance.  Always introduce yourself, no matter how well the person knows you. Remind the person of his or her location.  Place a calendar and clock near the person who is confused. Keep a regular schedule. Make sure the person has plenty of light during the day and sleep at night.  Talk about current events and plans for the day.  Keep the environment calm, quiet, and peaceful.  Help the person do the things that he or she is unable to do. These include: ? Taking medicines. ?  Keeping medical appointments. ? Helping with household duties, including meal preparation. ? Running errands.  Get help if you need it. There are several support groups for caregivers. If the person you are helping needs more support, consider day care, extended-care programs, or a skilled nursing facility. The person's health care provider may be able to help evaluate these options. General instructions  Monitor yourself for any conditions you may have. These can include: ? Checking your blood glucose levels if you have diabetes. ? Maintaining a healthy weight. ? Monitoring your blood pressure if you have hypertension. ? Monitoring your body temperature if you have a fever.  Keep all follow-up visits. This is important. Contact a health care provider if:  You have new symptoms or your symptoms get worse. Get help right away if you:  Feel that you are not able to care for yourself.  Develop severe headaches, repeated vomiting, seizures, blackouts, or slurred speech.  Have increasing confusion, weakness, numbness, restlessness, or personality  changes.  Develop a loss of balance, have marked dizziness, feel uncoordinated, or fall.  Develop severe anxiety, or you have delusions or hallucinations. These symptoms may represent a serious problem that is an emergency. Do not wait to see if the symptoms will go away. Get medical help right away. Call your local emergency services (911 in the [place] Do not drive yourself to the hospital. Summary  Confusion is the inability to think with your usual speed or clarity. People who are confused often describe their thinking as cloudy or unclear.  Confusion can also include having trouble remembering, paying attention, or making decisions.  Confusion may come on quickly or develop slowly over time, depending on the cause. There are many different causes of confusion.  Ask for help from family members or friends if you are unable to take care of yourself. This information is not intended to replace advice given to you by your health care provider. Make sure you discuss any questions you have with your health care provider. Document Revised: 03/11/2020 Document Reviewed: 03/11/2020 Elsevier Patient Education  2021 [employer].

## 2021-03-25 NOTE — Progress Notes (Signed)
Subjective:    Patient ID: Sabrina Hart, female    DOB: [birth year], 87 y.o.   MRN: [id number]  No chief complaint on file. Patient accompanied by her daughter.  HPI Patient was seen today for ongiong concerns.  Pt with increased right leg pain and confusion.  Started wearing new or more supportive shoes since last OFV.  Given Tylenol for hip pain daily at [facility] day program.  Pt now with balance issues and dragging her leg 2/2 the pain.  Pt went on a 3 mile walk where she got lost.  States was walking to [place].  Was able to go to a store to ask for help.  Family was called and went to pick her up.  Past Medical History:  Diagnosis Date  . Anginal pain (HCC)    1990's; 09/28/2012  . Arthritis    "very mild" (09/28/2012)  . Coronary artery disease 09/27/2012   s/p PCI of the RCA with DES with remote PCI in [place] 15 to 20 years ago. 2. cath 02/2013 patent mid RCA stent, 30% left main, 70% mid to distaal tanden 70% lesions x 3, 60% mid diag, 50% ostial LCx, 40% mid LCx.   . Glaucoma   . Hyperlipidemia   . Hypothyroidism     Allergies  Allergen Reactions  . Other Other (See Comments)    Metals; "skin gets weepy; gets worse if it makes contact" (09/28/2012)  . Pollen Extract Other (See Comments)    "sneezing; watery eyes"  . Latex Itching and Swelling    Skin swells  . Morphine And Related Rash  . Nickel Other (See Comments)    Causes weepiness (skin)  . Penicillins Rash    Has patient had a PCN reaction causing immediate rash, facial/tongue/throat swelling, SOB or lightheadedness with hypotension: Yes Has patient had a PCN reaction causing severe rash involving mucus membranes or skin necrosis: Unk Has patient had a PCN reaction that required hospitalization: No  Has patient had a PCN reaction occurring within the last 10 years: No If all of the above answers are "NO", then may proceed with Cephalosporin use.     ROS limited 2/2 h/o dementia General: Denies  fever, chills, night sweats, changes in weight, changes in appetite HEENT: Denies headaches, ear pain, changes in vision, rhinorrhea, sore throat CV: Denies CP, palpitations, SOB, orthopnea Pulm: Denies SOB, cough, wheezing GI: Denies abdominal pain, nausea, vomiting, diarrhea, constipation GU: Denies dysuria, hematuria, frequency, vaginal discharge Msk: Denies muscle cramps, joint pains  +RLE pain, myalgia Neuro: Denies weakness, numbness, tingling  +heaviness in RLE Skin: Denies rashes, bruising Psych: Denies depression, anxiety, hallucinations      Objective:    Blood pressure 132/68, pulse 75, temperature 97.6 F (36.4 C), temperature source Oral, weight 138 lb 12.8 oz (63 kg), SpO2 99 %.  Gen. Pleasant, well-nourished, in no distress, normal affect   HEENT: Siren/AT, face symmetric, conjunctiva clear, no scleral icterus, PERRLA, EOMI, nares patent without drainage Neck: No JVD, no thyromegaly Lungs: no accessory muscle use, CTAB, no wheezes or rales Cardiovascular: RRR, no m/r/g, no peripheral edema Musculoskeletal: TTP of midline lumbosacral spine and upper lateral R thigh.  +log roll,  FADIR and FABER on R.  Negative logroll, straight leg raise, FADIR, FABER left.  No crepitus of b/l knees or joint line tenderness.  No deformities, no cyanosis or clubbing, normal tone Neuro:  A&Ox3, CN II-XII intact, requires assistance with ambulation for support.  Slow gait with dragging of RLE which causes  discomfort Skin:  Warm, no lesions/ rash   Wt Readings from Last 3 Encounters:  03/25/21 138 lb 12.8 oz (63 kg)  02/25/21 136 lb (61.7 kg)  09/25/20 120 lb 9.6 oz (54.7 kg)    Lab Results  Component Value Date   WBC 4.3 02/25/2021   HGB 14.7 02/25/2021   HCT 43.3 02/25/2021   PLT 286.0 02/25/2021   GLUCOSE 117 (H) 02/25/2021   CHOL 284 (H) 02/25/2021   TRIG 199.0 (H) 02/25/2021   HDL 94.30 02/25/2021   LDLDIRECT 127.7 01/14/2014   LDLCALC 150 (H) 02/25/2021   ALT 13 09/25/2020    AST 19 09/25/2020   NA 140 02/25/2021   K 4.1 02/25/2021   CL 101 02/25/2021   CREATININE 0.79 02/25/2021   BUN 19 02/25/2021   CO2 30 02/25/2021   TSH 4.73 (H) 02/25/2021   INR 1.0 03/02/2013   HGBA1C 5.6 02/25/2021    Assessment/Plan:  Right hip pain  -worsening -Discussed concern for arthritis versus fracture-no denies injury/fall.  Also consider CVA though no neurodeficits noted and pain seems to be in the right hip joint. -Discussed obtaining imaging -Okay to use Voltaren gel as needed -Further recommendations based on imaging such as referral to PT or Ortho. -Given precautions - Plan: CBC with Differential/Platelet, DG Hip Unilat W OR W/O Pelvis 2-3 Views Right  Confusion  -Discussed possible causes including UTI, thyroid dysfunction, worsening dementia -UA negative.  SG 1.015, no ketones, glucose, blood, leuks, nitrites - Plan: POCT urinalysis dipstick, TSH, CBC with Differential/Platelet, Comprehensive metabolic panel  Acquired hypothyroidism  -Labile in the past but improved with medication compliance. -We will recheck TSH -Continue current dose levothyroxine 150 mcg daily - Plan: TSH  Dementia without behavioral disturbance, unspecified type dementia (HCC) -Mildly progressing -Tried donepezil in the past but no longer taking this patient does not like taking medications. -For continued or worsening confusion consider MRI to evaluate  -Continue to monitor  F/u as needed in the few week, sooner if needed  Abbe Amsterdam, MD

## 2021-03-27 ENCOUNTER — Telehealth: Payer: Self-pay | Admitting: Family Medicine

## 2021-03-27 NOTE — Telephone Encounter (Addendum)
Patient is returning the call, please advise. CB is [phone]

## 2021-03-30 NOTE — Progress Notes (Signed)
Spoke with daughter, per FYI, and she is aware of results. States she will call back about the referral to ortho surgery.

## 2021-03-30 NOTE — Telephone Encounter (Signed)
Patient daughter is calling back and wanted to see if patient xray results were back, please advise. CB is [phone]

## 2021-03-30 NOTE — Telephone Encounter (Signed)
Spoke with patient's daughter, is aware of results.

## 2021-04-07 NOTE — Telephone Encounter (Signed)
Please provide pt's daughter with [facility] provider list.  The list should help get her started.  She does not need a referral for [facility].

## 2021-04-09 NOTE — Telephone Encounter (Signed)
Called pt daughter, no answer. Left vm that will place an envelope at front desk for pick up.

## 2021-04-13 ENCOUNTER — Telehealth: Payer: Self-pay | Admitting: Family Medicine

## 2021-04-13 NOTE — Telephone Encounter (Signed)
FL-2 form to be filled out--Placed in dr's folder.  Call [phone] upon completion.

## 2021-04-15 NOTE — Telephone Encounter (Signed)
Form rec'd, placed on Dr desk. Will call when it is completed.

## 2021-04-16 ENCOUNTER — Other Ambulatory Visit: Payer: Self-pay | Admitting: Family Medicine

## 2021-04-16 DIAGNOSIS — E039 Hypothyroidism, unspecified: Secondary | ICD-10-CM

## 2021-04-17 NOTE — Telephone Encounter (Signed)
Pts daughter is calling in to check the status of the FL-2 form that was dropped off and she is aware that we ask for 3-5 business days and it is on the providers desk to be completed.  Daughter would like to pick the form up by 04/24/2021 b/c they are trying to get the pt moved in by 04/25/2021.  Daughter would like to have a cal back.

## 2021-04-20 NOTE — Telephone Encounter (Signed)
Ibisi the patient's daughter called to follow up on the [facility] form. She needs it completed before 06/01 before she has to pay for the memory center and the other center when she is not there.   Please Call Ibisi at [phone]

## 2021-04-21 NOTE — Telephone Encounter (Signed)
Called patients daughter, advised paper work is completed and at front desk for pick up.

## 2021-04-24 ENCOUNTER — Other Ambulatory Visit: Payer: Self-pay

## 2021-04-24 ENCOUNTER — Telehealth: Payer: Self-pay | Admitting: Family Medicine

## 2021-04-24 NOTE — Telephone Encounter (Signed)
Pts daughter is calling in stating that the [facility] form was incomplete per the facility the top of the form (demograhic area) and add the medication to the bottom part of section #18 that the pt is taking or may need in the future that is on the pts current medication list. Daughter will be bring the form back on 04/28/2021 at pt appointment to have TB test done to see if it can be completed at that time.  Daughter is aware that it could take 3-5 business days for the completion. Daughter stated that she will call the facility to see if they need to have the medications written on the form or if they can take the medication list that was attached to the FL2.

## 2021-04-24 NOTE — Telephone Encounter (Signed)
Left message for patient to call back and schedule Medicare Annual Wellness Visit (AWV) either virtually or in office.   Last AWV 02/20/20   please schedule at anytime with [facility] Nurse Health Advisor 1 or 2   This should be a 45 minute visit.  

## 2021-04-28 ENCOUNTER — Other Ambulatory Visit: Payer: Self-pay

## 2021-04-28 ENCOUNTER — Ambulatory Visit (INDEPENDENT_AMBULATORY_CARE_PROVIDER_SITE_OTHER): Payer: Medicare Other | Admitting: *Deleted

## 2021-04-28 DIAGNOSIS — Z23 Encounter for immunization: Secondary | ICD-10-CM

## 2021-04-30 ENCOUNTER — Ambulatory Visit: Payer: Medicare Other

## 2021-04-30 LAB — TB SKIN TEST
Induration: 0 mm
TB Skin Test: NEGATIVE

## 2021-05-01 ENCOUNTER — Telehealth: Payer: Self-pay | Admitting: Family Medicine

## 2021-05-01 NOTE — Telephone Encounter (Signed)
Synetta Fail is calling stating that the are faxing over a Diet Order for the pt that needs to be signed and faxed back.

## 2021-05-04 NOTE — Telephone Encounter (Signed)
Diet order was rec'd, completed and faxed back.

## 2021-05-07 NOTE — Telephone Encounter (Signed)
Rec'd papers for diet from the [facility] forms to be signed and faxed back to facility. Paper was signed and returned.

## 2021-05-08 DIAGNOSIS — H401132 Primary open-angle glaucoma, bilateral, moderate stage: Secondary | ICD-10-CM | POA: Diagnosis not present

## 2021-06-02 ENCOUNTER — Encounter (HOSPITAL_COMMUNITY): Payer: Self-pay

## 2021-06-02 ENCOUNTER — Emergency Department (HOSPITAL_COMMUNITY): Payer: Medicare Other

## 2021-06-02 ENCOUNTER — Other Ambulatory Visit: Payer: Self-pay

## 2021-06-02 ENCOUNTER — Emergency Department (HOSPITAL_COMMUNITY)
Admission: EM | Admit: 2021-06-02 | Discharge: 2021-06-02 | Disposition: A | Discharge status: Home / Self Care | Payer: Medicare Other | Attending: Emergency Medicine | Admitting: Emergency Medicine

## 2021-06-02 DIAGNOSIS — Z7982 Long term (current) use of aspirin: Secondary | ICD-10-CM | POA: Insufficient documentation

## 2021-06-02 DIAGNOSIS — Z79899 Other long term (current) drug therapy: Secondary | ICD-10-CM | POA: Diagnosis not present

## 2021-06-02 DIAGNOSIS — Z743 Need for continuous supervision: Secondary | ICD-10-CM | POA: Diagnosis not present

## 2021-06-02 DIAGNOSIS — R079 Chest pain, unspecified: Secondary | ICD-10-CM | POA: Insufficient documentation

## 2021-06-02 DIAGNOSIS — R410 Disorientation, unspecified: Secondary | ICD-10-CM | POA: Diagnosis not present

## 2021-06-02 DIAGNOSIS — R6889 Other general symptoms and signs: Secondary | ICD-10-CM | POA: Diagnosis not present

## 2021-06-02 DIAGNOSIS — R0602 Shortness of breath: Secondary | ICD-10-CM | POA: Diagnosis not present

## 2021-06-02 DIAGNOSIS — E039 Hypothyroidism, unspecified: Secondary | ICD-10-CM | POA: Diagnosis not present

## 2021-06-02 DIAGNOSIS — Z91040 Latex allergy status: Secondary | ICD-10-CM | POA: Diagnosis not present

## 2021-06-02 DIAGNOSIS — R0789 Other chest pain: Secondary | ICD-10-CM | POA: Diagnosis not present

## 2021-06-02 DIAGNOSIS — F0390 Unspecified dementia without behavioral disturbance: Secondary | ICD-10-CM | POA: Insufficient documentation

## 2021-06-02 DIAGNOSIS — R404 Transient alteration of awareness: Secondary | ICD-10-CM | POA: Diagnosis not present

## 2021-06-02 DIAGNOSIS — M549 Dorsalgia, unspecified: Secondary | ICD-10-CM | POA: Insufficient documentation

## 2021-06-02 DIAGNOSIS — Z0000 Encounter for general adult medical examination without abnormal findings: Secondary | ICD-10-CM

## 2021-06-02 LAB — BASIC METABOLIC PANEL
Anion gap: 6 (ref 5–15)
BUN: 9 mg/dL (ref 8–23)
CO2: 31 mmol/L (ref 22–32)
Calcium: 9.2 mg/dL (ref 8.9–10.3)
Chloride: 101 mmol/L (ref 98–111)
Creatinine, Ser: 0.7 mg/dL (ref 0.44–1.00)
GFR, Estimated: >60 mL/min (ref >60)
Glucose, Bld: 84 mg/dL (ref 70–99)
Potassium: 3.8 mmol/L (ref 3.5–5.1)
Sodium: 138 mmol/L (ref 135–145)

## 2021-06-02 LAB — CBC
HCT: 41.6 % (ref 36.0–46.0)
Hemoglobin: 14 g/dL (ref 12.0–15.0)
MCH: 30.8 pg (ref 26.0–34.0)
MCHC: 33.7 g/dL (ref 30.0–36.0)
MCV: 91.6 fL (ref 80.0–100.0)
Platelets: 281 10*3/uL (ref 150–400)
RBC: 4.54 MIL/uL (ref 3.87–5.11)
RDW: 12.9 % (ref 11.5–15.5)
WBC: 5 10*3/uL (ref 4.0–10.5)
nRBC: 0 % (ref 0.0–0.2)

## 2021-06-02 LAB — TROPONIN I (HIGH SENSITIVITY)
Troponin I (High Sensitivity): 9 ng/L (ref <18)
Troponin I (High Sensitivity): 9 ng/L (ref <18)

## 2021-06-02 NOTE — ED Notes (Signed)
Attempted report to [facility].  No answer.

## 2021-06-02 NOTE — Discharge Instructions (Addendum)
You have been seen and discharged from the emergency department.  Your heart and lung work-up was normal.  Follow-up with your primary provider for reevaluation and further care. Take home medications as prescribed. If you have any worsening symptoms or further concerns for your health please return to an emergency department for further evaluation. 

## 2021-06-02 NOTE — ED Provider Notes (Signed)
Emergency Medicine Provider Triage Evaluation Note  Sabrina Hart , a 87 y.o. female  was evaluated in triage.  Pt complains of chest pain x this morning per daughter.  Dementia at baseline, asymptomatic on arrival.  Review of Systems  Positive: Chest pain Negative: Shortness of breath  Physical Exam  BP (!) 172/72   Pulse 80   Temp 98.5 F (36.9 C) (Oral)   Resp 17   SpO2 100%  Gen:   Awake, no distress   Resp:  Normal effort  MSK:   Moves extremities without difficulty  Other:    Medical Decision Making  Medically screening exam initiated at 12:27 PM.  Appropriate orders placed.  Sabrina Hart was informed that the remainder of the evaluation will be completed by another provider, this initial triage assessment does not replace that evaluation, and the importance of remaining in the ED until their evaluation is complete.     Claude Manges, PA-C 06/02/21 1228    Milagros Loll, MD 06/03/21 [phone]

## 2021-06-02 NOTE — ED Provider Notes (Signed)
Patient her [facility] EMERGENCY DEPARTMENT Provider Note   CSN: [id number] Arrival date & time: 06/02/21  1220     History No chief complaint on file.   Sabrina Hart is a 87 y.o. female.  HPI  87 year old female with past medical history of HLD, CAD and dementia presents to the emergency department reported chest pain.  Self is pleasantly demented but denies any complaints.  Report is that the patient complained of brief chest pain while she was with the daughter.  No family at bedside.  Patient has known chest pain, back pain or shortness of breath at this time.  No swelling of her lower extremities.  She states she is otherwise been well.  Past Medical History:  Diagnosis Date   Anginal pain [facility])    1990's; 09/28/2012   Arthritis    "very mild" (09/28/2012)   Coronary artery disease 09/27/2012   s/p PCI of the RCA with DES with remote PCI in [place] 15 to 20 years ago. 2. cath 02/2013 patent mid RCA stent, 30% left main, 70% mid to distaal tanden 70% lesions x 3, 60% mid diag, 50% ostial LCx, 40% mid LCx.    Glaucoma    Hyperlipidemia    Hypothyroidism     Patient Active Problem List   Diagnosis Date Noted   Dementia without behavioral disturbance (HCC) 04/12/2019   Lower extremity edema 04/26/2017   Unstable angina (HCC) 03/02/2013   Hypothyroidism 09/29/2012   Hyperlipidemia 09/29/2012   Hypokalemia 09/29/2012   CAD (coronary artery disease) 09/27/2012    Past Surgical History:  Procedure Laterality Date   CARDIAC CATHETERIZATION  1990s   PCI   CATARACT EXTRACTION W/ INTRAOCULAR LENS  IMPLANT, BILATERAL  1970's   CORONARY ANGIOPLASTY WITH STENT PLACEMENT  09/28/2012   20-30% diffuse LAD stenosis, mild-mod D1 dz, 70% distal D1 lesion, mild diffuse LCx dz, 50-60% OM1 stenosis, 60% mid & 90% mid-dis RCA stenosis s/p DES; LVEF 75-80% w/ hyperdynamic fxn.    DENTAL SURGERY     "implants"   INNER EAR SURGERY  ~ 2010   "put plugs in so I  could fly" (09/28/2012)   LEFT HEART CATHETERIZATION WITH CORONARY ANGIOGRAM N/A 09/28/2012   Procedure: LEFT HEART CATHETERIZATION WITH CORONARY ANGIOGRAM;  Surgeon: [name], MD;  Location: [facility] C[facility] LAB;  Service: Cardiovascular;  Laterality: N/A;   LUMBAR DISC SURGERY  1980's   "took out a little piece" (09/28/2012)   PERCUTANEOUS CORONARY STENT INTERVENTION (PCI-S)  09/28/2012   Procedure: PERCUTANEOUS CORONARY STENT INTERVENTION (PCI-S);  Surgeon: [name], MD;  Location: [facility] CATH LAB;  Service: Cardiovascular;;   REDUCTION MAMMAPLASTY  1970's     OB History   No obstetric history on file.     No family history on file.  Social History   Tobacco Use   Smoking status: Never   Smokeless tobacco: Never  Vaping Use   Vaping Use: Never used  Substance Use Topics   Alcohol use: Yes    Alcohol/week: 1.0 standard drink    Types: 1 Glasses of wine per week    Comment: 09/28/2012 "1 glass of wine averages once/wk; beer average once/week during summer w/friends"   Drug use: No    Home Medications Prior to Admission medications   Medication Sig Start Date End Date Taking? Authorizing Provider  acetaminophen (TYLENOL) 500 MG tablet Take 1 tablet (500 mg total) by mouth every 6 (six) hours as needed. Patient not taking: Reported on 02/25/2021  07/25/20   Georgetta Haber, NP  AMBULATORY NON FORMULARY MEDICATION Take 300 mg by mouth as directed. Medication Name: Inclirisan sodium 300mg  vs placebo (orion-10 Study per protocol Patient not taking: Reported on 02/25/2021 12/30/16   02/27/17, MD  aspirin EC 81 MG tablet Take 1 tablet (81 mg total) by mouth daily. 05/17/17   Bhagat, 05/19/17, PA  atorvastatin (LIPITOR) 40 MG tablet Take 1 tablet (40 mg total) by mouth daily. 02/27/21   04/29/21, MD  donepezil (ARICEPT) 5 MG tablet Take 1 tablet (5 mg total) by mouth at bedtime. Patient not taking: Reported on 02/25/2021 05/07/19   07/07/19, MD  levothyroxine  (SYNTHROID) 150 MCG tablet Take 1 tablet (150 mcg total) by mouth daily before breakfast. 02/27/21   04/29/21, MD  nitroGLYCERIN (NITROSTAT) 0.4 MG SL tablet PLACE 1 TABLET UNDER THE TONGUE EVERY 5 MINUTES AS NEEDED FOR CHEST PAIN. Patient not taking: Reported on 02/25/2021 04/03/18   Nahser, 06/03/18, MD  Vitamin D, Ergocalciferol, (DRISDOL) 1.25 MG (50000 UNIT) CAPS capsule Take 1 capsule (50,000 Units total) by mouth every 7 (seven) days. 09/26/20   09/28/20, MD    Allergies    Other, Pollen extract, Latex, Morphine and related, Nickel, and Penicillins  Review of Systems   Review of Systems  Unable to perform ROS: Dementia   Physical Exam Updated Vital Signs BP (!) 174/66 (BP Location: Right Arm)   Pulse 77   Temp 98.5 F (36.9 C) (Oral)   Resp 18   SpO2 98%   Physical Exam Vitals and nursing note reviewed.  Constitutional:      General: She is not in acute distress.    Appearance: Normal appearance. She is not toxic-appearing or diaphoretic.  HENT:     Head: Normocephalic.     Mouth/Throat:     Mouth: Mucous membranes are moist.  Eyes:     Pupils: Pupils are equal, round, and reactive to light.  Cardiovascular:     Rate and Rhythm: Normal rate.  Pulmonary:     Effort: Pulmonary effort is normal. No respiratory distress.  Abdominal:     Palpations: Abdomen is soft.     Tenderness: There is no abdominal tenderness.  Musculoskeletal:        General: No swelling.  Skin:    General: Skin is warm.  Neurological:     Mental Status: She is alert. Mental status is at baseline.  Psychiatric:        Mood and Affect: Mood normal.    ED Results / Procedures / Treatments   Labs (all labs ordered are listed, but only abnormal results are displayed) Labs Reviewed  BASIC METABOLIC PANEL  CBC  TROPONIN I (HIGH SENSITIVITY)  TROPONIN I (HIGH SENSITIVITY)    EKG EKG Interpretation  Date/Time:  Tuesday June 02 2021 12:25:12 EDT Ventricular Rate:  79 PR  Interval:  128 QRS Duration: 62 QT Interval:  348 QTC Calculation: 399 R Axis:   36 Text Interpretation: Normal sinus rhythm Normal ECG NSR, similar to previous Confirmed by [id number] [phone]) on 06/02/2021 3:48:07 PM  Radiology DG Chest 2 View  Result Date: 06/02/2021 CLINICAL DATA:  Chest pain EXAM: CHEST - 2 VIEW COMPARISON:  10/03/2018 FINDINGS: The heart size and mediastinal contours are within normal limits. Both lungs are clear. The visualized skeletal structures are unremarkable. IMPRESSION: No active cardiopulmonary disease. Electronically Signed   By: 13/03/2018 M.D.   On: 06/02/2021  13:09    Procedures Procedures   Medications Ordered in ED Medications - No data to display  ED Course  I have reviewed the triage vital signs and the nursing notes.  Pertinent labs & imaging results that were available during my care of the patient were reviewed by me and considered in my medical decision making (see chart for details).    MDM Rules/Calculators/A&P                          87 year old patient presents the emergency department reported chest pain.  Patient is demented at baseline, appears to be in her baseline.  She denies any complaints.  Her work-up is very reassuring.  EKG is baseline and unchanged for the patient.  Vitals are normal.  Blood work is reassuring, 2 troponins with negative delta.  Low suspicion for ongoing ACS given normal cardiac work-up.  Low suspicion for PE or aortic dissection.  She is very well-appearing, has no complaints.  Has been able to eat and drink.  Patient will be discharged and treated as an outpatient.  Discharge plan and strict return to ED precautions discussed, patient verbalizes understanding and agreement.  Final Clinical Impression(s) / ED Diagnoses Final diagnoses:  General medical exam    Rx / DC Orders ED Discharge Orders     None        Rozelle Logan, DO 06/02/21 1944

## 2021-06-02 NOTE — ED Triage Notes (Signed)
Patient arrived from [facility] facility for reported chest pain by staff today that lasted for 1 hour. On arrival patient alert to her baseline due to her dementia. Patient denis pain and appears in NAD

## 2021-06-08 ENCOUNTER — Telehealth: Payer: Self-pay | Admitting: Family Medicine

## 2021-06-08 NOTE — Telephone Encounter (Signed)
Left message for patient to call back and schedule Medicare Annual Wellness Visit (AWV) either virtually or in office.   Last AWV 02/20/20   please schedule at anytime with [facility] Nurse Health Advisor 1 or 2   This should be a 45 minute visit.

## 2021-06-13 ENCOUNTER — Other Ambulatory Visit: Payer: Self-pay | Admitting: Family Medicine

## 2021-06-13 DIAGNOSIS — E039 Hypothyroidism, unspecified: Secondary | ICD-10-CM

## 2021-06-30 DIAGNOSIS — Z1152 Encounter for screening for COVID-19: Secondary | ICD-10-CM | POA: Diagnosis not present

## 2021-07-03 ENCOUNTER — Telehealth: Payer: Self-pay | Admitting: Family Medicine

## 2021-07-03 NOTE — Telephone Encounter (Signed)
Left message for patient to call back and schedule Medicare Annual Wellness Visit (AWV) either virtually or in office. Left both  my jabber number [phone] and office number    Last AWV ;02/20/20  please schedule at anytime with [facility] Nurse Health Advisor 1 or 2   This should be a 45 minute visit.

## 2021-07-07 DIAGNOSIS — Z1152 Encounter for screening for COVID-19: Secondary | ICD-10-CM | POA: Diagnosis not present

## 2021-07-07 NOTE — Telephone Encounter (Signed)
PT is not wanting to set this up at this time

## 2021-07-08 NOTE — Telephone Encounter (Signed)
Documented on spreadsheet 

## 2021-08-22 ENCOUNTER — Telehealth: Payer: Self-pay | Admitting: Family Medicine

## 2021-08-22 NOTE — Telephone Encounter (Signed)
LVM for pt to call the office to schedule AWV with [facility]. Please schedule this appt if pt calls the office.  

## 2021-09-01 DIAGNOSIS — G301 Alzheimer's disease with late onset: Secondary | ICD-10-CM | POA: Diagnosis not present

## 2021-09-02 DIAGNOSIS — F03C0 Unspecified dementia, severe, without behavioral disturbance, psychotic disturbance, mood disturbance, and anxiety: Secondary | ICD-10-CM | POA: Diagnosis not present

## 2021-09-02 DIAGNOSIS — M160 Bilateral primary osteoarthritis of hip: Secondary | ICD-10-CM | POA: Diagnosis not present

## 2021-09-02 DIAGNOSIS — E039 Hypothyroidism, unspecified: Secondary | ICD-10-CM | POA: Diagnosis not present

## 2021-09-07 DIAGNOSIS — H401132 Primary open-angle glaucoma, bilateral, moderate stage: Secondary | ICD-10-CM | POA: Diagnosis not present

## 2021-09-07 DIAGNOSIS — H4720 Unspecified optic atrophy: Secondary | ICD-10-CM | POA: Diagnosis not present

## 2021-09-08 DIAGNOSIS — E039 Hypothyroidism, unspecified: Secondary | ICD-10-CM | POA: Diagnosis not present

## 2021-09-08 DIAGNOSIS — F03C0 Unspecified dementia, severe, without behavioral disturbance, psychotic disturbance, mood disturbance, and anxiety: Secondary | ICD-10-CM | POA: Diagnosis not present

## 2021-09-08 DIAGNOSIS — E785 Hyperlipidemia, unspecified: Secondary | ICD-10-CM | POA: Diagnosis not present

## 2021-09-08 DIAGNOSIS — I2510 Atherosclerotic heart disease of native coronary artery without angina pectoris: Secondary | ICD-10-CM | POA: Diagnosis not present

## 2021-09-08 DIAGNOSIS — M160 Bilateral primary osteoarthritis of hip: Secondary | ICD-10-CM | POA: Diagnosis not present

## 2021-09-10 DIAGNOSIS — E559 Vitamin D deficiency, unspecified: Secondary | ICD-10-CM | POA: Diagnosis not present

## 2021-09-10 DIAGNOSIS — E039 Hypothyroidism, unspecified: Secondary | ICD-10-CM | POA: Diagnosis not present

## 2021-09-10 DIAGNOSIS — F03C0 Unspecified dementia, severe, without behavioral disturbance, psychotic disturbance, mood disturbance, and anxiety: Secondary | ICD-10-CM | POA: Diagnosis not present

## 2021-09-15 DIAGNOSIS — M160 Bilateral primary osteoarthritis of hip: Secondary | ICD-10-CM | POA: Diagnosis not present

## 2021-09-15 DIAGNOSIS — E785 Hyperlipidemia, unspecified: Secondary | ICD-10-CM | POA: Diagnosis not present

## 2021-09-15 DIAGNOSIS — I2510 Atherosclerotic heart disease of native coronary artery without angina pectoris: Secondary | ICD-10-CM | POA: Diagnosis not present

## 2021-09-15 DIAGNOSIS — E039 Hypothyroidism, unspecified: Secondary | ICD-10-CM | POA: Diagnosis not present

## 2021-09-15 DIAGNOSIS — F03C0 Unspecified dementia, severe, without behavioral disturbance, psychotic disturbance, mood disturbance, and anxiety: Secondary | ICD-10-CM | POA: Diagnosis not present

## 2021-09-17 DIAGNOSIS — F03C0 Unspecified dementia, severe, without behavioral disturbance, psychotic disturbance, mood disturbance, and anxiety: Secondary | ICD-10-CM | POA: Diagnosis not present

## 2021-09-17 DIAGNOSIS — E039 Hypothyroidism, unspecified: Secondary | ICD-10-CM | POA: Diagnosis not present

## 2021-09-17 DIAGNOSIS — E785 Hyperlipidemia, unspecified: Secondary | ICD-10-CM | POA: Diagnosis not present

## 2021-09-17 DIAGNOSIS — M160 Bilateral primary osteoarthritis of hip: Secondary | ICD-10-CM | POA: Diagnosis not present

## 2021-09-17 DIAGNOSIS — I2510 Atherosclerotic heart disease of native coronary artery without angina pectoris: Secondary | ICD-10-CM | POA: Diagnosis not present

## 2021-09-20 DIAGNOSIS — E785 Hyperlipidemia, unspecified: Secondary | ICD-10-CM | POA: Diagnosis not present

## 2021-09-20 DIAGNOSIS — F03C0 Unspecified dementia, severe, without behavioral disturbance, psychotic disturbance, mood disturbance, and anxiety: Secondary | ICD-10-CM | POA: Diagnosis not present

## 2021-09-20 DIAGNOSIS — E039 Hypothyroidism, unspecified: Secondary | ICD-10-CM | POA: Diagnosis not present

## 2021-09-20 DIAGNOSIS — M160 Bilateral primary osteoarthritis of hip: Secondary | ICD-10-CM | POA: Diagnosis not present

## 2021-09-20 DIAGNOSIS — I2510 Atherosclerotic heart disease of native coronary artery without angina pectoris: Secondary | ICD-10-CM | POA: Diagnosis not present

## 2021-09-21 DIAGNOSIS — H401132 Primary open-angle glaucoma, bilateral, moderate stage: Secondary | ICD-10-CM | POA: Diagnosis not present

## 2021-09-21 DIAGNOSIS — H4720 Unspecified optic atrophy: Secondary | ICD-10-CM | POA: Diagnosis not present

## 2021-09-22 DIAGNOSIS — F03C0 Unspecified dementia, severe, without behavioral disturbance, psychotic disturbance, mood disturbance, and anxiety: Secondary | ICD-10-CM | POA: Diagnosis not present

## 2021-09-22 DIAGNOSIS — M160 Bilateral primary osteoarthritis of hip: Secondary | ICD-10-CM | POA: Diagnosis not present

## 2021-09-22 DIAGNOSIS — E785 Hyperlipidemia, unspecified: Secondary | ICD-10-CM | POA: Diagnosis not present

## 2021-09-22 DIAGNOSIS — E039 Hypothyroidism, unspecified: Secondary | ICD-10-CM | POA: Diagnosis not present

## 2021-09-22 DIAGNOSIS — I2510 Atherosclerotic heart disease of native coronary artery without angina pectoris: Secondary | ICD-10-CM | POA: Diagnosis not present

## 2021-09-28 DIAGNOSIS — E785 Hyperlipidemia, unspecified: Secondary | ICD-10-CM | POA: Diagnosis not present

## 2021-09-28 DIAGNOSIS — E039 Hypothyroidism, unspecified: Secondary | ICD-10-CM | POA: Diagnosis not present

## 2021-09-28 DIAGNOSIS — M160 Bilateral primary osteoarthritis of hip: Secondary | ICD-10-CM | POA: Diagnosis not present

## 2021-09-28 DIAGNOSIS — I2510 Atherosclerotic heart disease of native coronary artery without angina pectoris: Secondary | ICD-10-CM | POA: Diagnosis not present

## 2021-09-28 DIAGNOSIS — F03C0 Unspecified dementia, severe, without behavioral disturbance, psychotic disturbance, mood disturbance, and anxiety: Secondary | ICD-10-CM | POA: Diagnosis not present

## 2021-09-29 DIAGNOSIS — G301 Alzheimer's disease with late onset: Secondary | ICD-10-CM | POA: Diagnosis not present

## 2021-10-03 DIAGNOSIS — E039 Hypothyroidism, unspecified: Secondary | ICD-10-CM | POA: Diagnosis not present

## 2021-10-03 DIAGNOSIS — I2510 Atherosclerotic heart disease of native coronary artery without angina pectoris: Secondary | ICD-10-CM | POA: Diagnosis not present

## 2021-10-03 DIAGNOSIS — F03C0 Unspecified dementia, severe, without behavioral disturbance, psychotic disturbance, mood disturbance, and anxiety: Secondary | ICD-10-CM | POA: Diagnosis not present

## 2021-10-03 DIAGNOSIS — M160 Bilateral primary osteoarthritis of hip: Secondary | ICD-10-CM | POA: Diagnosis not present

## 2021-10-03 DIAGNOSIS — E785 Hyperlipidemia, unspecified: Secondary | ICD-10-CM | POA: Diagnosis not present

## 2021-10-05 DIAGNOSIS — I2510 Atherosclerotic heart disease of native coronary artery without angina pectoris: Secondary | ICD-10-CM | POA: Diagnosis not present

## 2021-10-05 DIAGNOSIS — H401130 Primary open-angle glaucoma, bilateral, stage unspecified: Secondary | ICD-10-CM | POA: Diagnosis not present

## 2021-10-05 DIAGNOSIS — M160 Bilateral primary osteoarthritis of hip: Secondary | ICD-10-CM | POA: Diagnosis not present

## 2021-10-05 DIAGNOSIS — E785 Hyperlipidemia, unspecified: Secondary | ICD-10-CM | POA: Diagnosis not present

## 2021-10-05 DIAGNOSIS — F03C0 Unspecified dementia, severe, without behavioral disturbance, psychotic disturbance, mood disturbance, and anxiety: Secondary | ICD-10-CM | POA: Diagnosis not present

## 2021-10-05 DIAGNOSIS — E039 Hypothyroidism, unspecified: Secondary | ICD-10-CM | POA: Diagnosis not present

## 2021-10-07 DIAGNOSIS — F03C0 Unspecified dementia, severe, without behavioral disturbance, psychotic disturbance, mood disturbance, and anxiety: Secondary | ICD-10-CM | POA: Diagnosis not present

## 2021-10-07 DIAGNOSIS — E785 Hyperlipidemia, unspecified: Secondary | ICD-10-CM | POA: Diagnosis not present

## 2021-10-07 DIAGNOSIS — E039 Hypothyroidism, unspecified: Secondary | ICD-10-CM | POA: Diagnosis not present

## 2021-10-07 DIAGNOSIS — M160 Bilateral primary osteoarthritis of hip: Secondary | ICD-10-CM | POA: Diagnosis not present

## 2021-10-07 DIAGNOSIS — I2510 Atherosclerotic heart disease of native coronary artery without angina pectoris: Secondary | ICD-10-CM | POA: Diagnosis not present

## 2021-10-13 DIAGNOSIS — E039 Hypothyroidism, unspecified: Secondary | ICD-10-CM | POA: Diagnosis not present

## 2021-10-13 DIAGNOSIS — F03C0 Unspecified dementia, severe, without behavioral disturbance, psychotic disturbance, mood disturbance, and anxiety: Secondary | ICD-10-CM | POA: Diagnosis not present

## 2021-10-13 DIAGNOSIS — E785 Hyperlipidemia, unspecified: Secondary | ICD-10-CM | POA: Diagnosis not present

## 2021-10-13 DIAGNOSIS — I2510 Atherosclerotic heart disease of native coronary artery without angina pectoris: Secondary | ICD-10-CM | POA: Diagnosis not present

## 2021-10-13 DIAGNOSIS — M160 Bilateral primary osteoarthritis of hip: Secondary | ICD-10-CM | POA: Diagnosis not present

## 2021-10-14 DIAGNOSIS — F03C0 Unspecified dementia, severe, without behavioral disturbance, psychotic disturbance, mood disturbance, and anxiety: Secondary | ICD-10-CM | POA: Diagnosis not present

## 2021-10-14 DIAGNOSIS — E039 Hypothyroidism, unspecified: Secondary | ICD-10-CM | POA: Diagnosis not present

## 2021-10-14 DIAGNOSIS — N390 Urinary tract infection, site not specified: Secondary | ICD-10-CM | POA: Diagnosis not present

## 2021-10-14 DIAGNOSIS — M160 Bilateral primary osteoarthritis of hip: Secondary | ICD-10-CM | POA: Diagnosis not present

## 2021-10-14 DIAGNOSIS — Z79899 Other long term (current) drug therapy: Secondary | ICD-10-CM | POA: Diagnosis not present

## 2021-10-15 DIAGNOSIS — M160 Bilateral primary osteoarthritis of hip: Secondary | ICD-10-CM | POA: Diagnosis not present

## 2021-10-15 DIAGNOSIS — I2510 Atherosclerotic heart disease of native coronary artery without angina pectoris: Secondary | ICD-10-CM | POA: Diagnosis not present

## 2021-10-15 DIAGNOSIS — F03C0 Unspecified dementia, severe, without behavioral disturbance, psychotic disturbance, mood disturbance, and anxiety: Secondary | ICD-10-CM | POA: Diagnosis not present

## 2021-10-15 DIAGNOSIS — E785 Hyperlipidemia, unspecified: Secondary | ICD-10-CM | POA: Diagnosis not present

## 2021-10-15 DIAGNOSIS — E039 Hypothyroidism, unspecified: Secondary | ICD-10-CM | POA: Diagnosis not present

## 2021-10-20 DIAGNOSIS — I2510 Atherosclerotic heart disease of native coronary artery without angina pectoris: Secondary | ICD-10-CM | POA: Diagnosis not present

## 2021-10-20 DIAGNOSIS — F03C0 Unspecified dementia, severe, without behavioral disturbance, psychotic disturbance, mood disturbance, and anxiety: Secondary | ICD-10-CM | POA: Diagnosis not present

## 2021-10-20 DIAGNOSIS — E039 Hypothyroidism, unspecified: Secondary | ICD-10-CM | POA: Diagnosis not present

## 2021-10-20 DIAGNOSIS — E785 Hyperlipidemia, unspecified: Secondary | ICD-10-CM | POA: Diagnosis not present

## 2021-10-20 DIAGNOSIS — M160 Bilateral primary osteoarthritis of hip: Secondary | ICD-10-CM | POA: Diagnosis not present

## 2021-10-28 DIAGNOSIS — E785 Hyperlipidemia, unspecified: Secondary | ICD-10-CM | POA: Diagnosis not present

## 2021-10-28 DIAGNOSIS — M160 Bilateral primary osteoarthritis of hip: Secondary | ICD-10-CM | POA: Diagnosis not present

## 2021-10-28 DIAGNOSIS — I2510 Atherosclerotic heart disease of native coronary artery without angina pectoris: Secondary | ICD-10-CM | POA: Diagnosis not present

## 2021-10-28 DIAGNOSIS — F03C0 Unspecified dementia, severe, without behavioral disturbance, psychotic disturbance, mood disturbance, and anxiety: Secondary | ICD-10-CM | POA: Diagnosis not present

## 2021-10-28 DIAGNOSIS — E039 Hypothyroidism, unspecified: Secondary | ICD-10-CM | POA: Diagnosis not present

## 2021-10-29 DIAGNOSIS — E559 Vitamin D deficiency, unspecified: Secondary | ICD-10-CM | POA: Diagnosis not present

## 2021-10-29 DIAGNOSIS — E039 Hypothyroidism, unspecified: Secondary | ICD-10-CM | POA: Diagnosis not present

## 2021-10-29 DIAGNOSIS — H401132 Primary open-angle glaucoma, bilateral, moderate stage: Secondary | ICD-10-CM | POA: Diagnosis not present

## 2021-10-29 DIAGNOSIS — G301 Alzheimer's disease with late onset: Secondary | ICD-10-CM | POA: Diagnosis not present

## 2021-10-29 DIAGNOSIS — E782 Mixed hyperlipidemia: Secondary | ICD-10-CM | POA: Diagnosis not present

## 2021-11-04 DIAGNOSIS — F03C0 Unspecified dementia, severe, without behavioral disturbance, psychotic disturbance, mood disturbance, and anxiety: Secondary | ICD-10-CM | POA: Diagnosis not present

## 2021-11-04 DIAGNOSIS — M160 Bilateral primary osteoarthritis of hip: Secondary | ICD-10-CM | POA: Diagnosis not present

## 2021-11-04 DIAGNOSIS — E785 Hyperlipidemia, unspecified: Secondary | ICD-10-CM | POA: Diagnosis not present

## 2021-11-04 DIAGNOSIS — E039 Hypothyroidism, unspecified: Secondary | ICD-10-CM | POA: Diagnosis not present

## 2021-11-04 DIAGNOSIS — I2510 Atherosclerotic heart disease of native coronary artery without angina pectoris: Secondary | ICD-10-CM | POA: Diagnosis not present

## 2021-11-26 DIAGNOSIS — G301 Alzheimer's disease with late onset: Secondary | ICD-10-CM | POA: Diagnosis not present

## 2021-11-26 DIAGNOSIS — H401132 Primary open-angle glaucoma, bilateral, moderate stage: Secondary | ICD-10-CM | POA: Diagnosis not present

## 2021-11-26 DIAGNOSIS — E039 Hypothyroidism, unspecified: Secondary | ICD-10-CM | POA: Diagnosis not present

## 2021-11-26 DIAGNOSIS — Z79899 Other long term (current) drug therapy: Secondary | ICD-10-CM | POA: Diagnosis not present

## 2021-11-26 DIAGNOSIS — F02A0 Dementia in other diseases classified elsewhere, mild, without behavioral disturbance, psychotic disturbance, mood disturbance, and anxiety: Secondary | ICD-10-CM | POA: Diagnosis not present

## 2021-12-10 DIAGNOSIS — H401132 Primary open-angle glaucoma, bilateral, moderate stage: Secondary | ICD-10-CM | POA: Diagnosis not present

## 2021-12-10 DIAGNOSIS — G301 Alzheimer's disease with late onset: Secondary | ICD-10-CM | POA: Diagnosis not present

## 2021-12-10 DIAGNOSIS — E559 Vitamin D deficiency, unspecified: Secondary | ICD-10-CM | POA: Diagnosis not present

## 2021-12-17 DIAGNOSIS — Z79899 Other long term (current) drug therapy: Secondary | ICD-10-CM | POA: Diagnosis not present

## 2021-12-17 DIAGNOSIS — E781 Pure hyperglyceridemia: Secondary | ICD-10-CM | POA: Diagnosis not present

## 2021-12-17 DIAGNOSIS — E559 Vitamin D deficiency, unspecified: Secondary | ICD-10-CM | POA: Diagnosis not present

## 2021-12-17 DIAGNOSIS — E875 Hyperkalemia: Secondary | ICD-10-CM | POA: Diagnosis not present

## 2021-12-22 DIAGNOSIS — G301 Alzheimer's disease with late onset: Secondary | ICD-10-CM | POA: Diagnosis not present

## 2021-12-28 ENCOUNTER — Telehealth: Payer: Self-pay | Admitting: Family Medicine

## 2021-12-28 NOTE — Telephone Encounter (Signed)
Left message for patient to call back and schedule Medicare Annual Wellness Visit (AWV) either virtually or in office. Left  my Sabrina Hart number [phone]   Last AWV ;02/20/20 please schedule at anytime with [facility] Nurse Health Advisor 1 or 2   This should be a 45 minute visit.

## 2022-01-05 DIAGNOSIS — F02B2 Dementia in other diseases classified elsewhere, moderate, with psychotic disturbance: Secondary | ICD-10-CM | POA: Diagnosis not present

## 2022-01-05 DIAGNOSIS — G301 Alzheimer's disease with late onset: Secondary | ICD-10-CM | POA: Diagnosis not present

## 2022-01-07 DIAGNOSIS — E038 Other specified hypothyroidism: Secondary | ICD-10-CM | POA: Diagnosis not present

## 2022-01-07 DIAGNOSIS — F02B2 Dementia in other diseases classified elsewhere, moderate, with psychotic disturbance: Secondary | ICD-10-CM | POA: Diagnosis not present

## 2022-01-07 DIAGNOSIS — E782 Mixed hyperlipidemia: Secondary | ICD-10-CM | POA: Diagnosis not present

## 2022-01-07 DIAGNOSIS — G301 Alzheimer's disease with late onset: Secondary | ICD-10-CM | POA: Diagnosis not present

## 2022-01-07 DIAGNOSIS — Z79899 Other long term (current) drug therapy: Secondary | ICD-10-CM | POA: Diagnosis not present

## 2022-01-20 DIAGNOSIS — F02B2 Dementia in other diseases classified elsewhere, moderate, with psychotic disturbance: Secondary | ICD-10-CM | POA: Diagnosis not present

## 2022-01-20 DIAGNOSIS — G301 Alzheimer's disease with late onset: Secondary | ICD-10-CM | POA: Diagnosis not present

## 2022-01-21 DIAGNOSIS — E782 Mixed hyperlipidemia: Secondary | ICD-10-CM | POA: Diagnosis not present

## 2022-01-21 DIAGNOSIS — E559 Vitamin D deficiency, unspecified: Secondary | ICD-10-CM | POA: Diagnosis not present

## 2022-01-21 DIAGNOSIS — F02818 Dementia in other diseases classified elsewhere, unspecified severity, with other behavioral disturbance: Secondary | ICD-10-CM | POA: Diagnosis not present

## 2022-01-21 DIAGNOSIS — E038 Other specified hypothyroidism: Secondary | ICD-10-CM | POA: Diagnosis not present

## 2022-01-21 DIAGNOSIS — G301 Alzheimer's disease with late onset: Secondary | ICD-10-CM | POA: Diagnosis not present

## 2022-01-22 DIAGNOSIS — H4720 Unspecified optic atrophy: Secondary | ICD-10-CM | POA: Diagnosis not present

## 2022-01-22 DIAGNOSIS — H40051 Ocular hypertension, right eye: Secondary | ICD-10-CM | POA: Diagnosis not present

## 2022-01-22 DIAGNOSIS — H401132 Primary open-angle glaucoma, bilateral, moderate stage: Secondary | ICD-10-CM | POA: Diagnosis not present

## 2022-01-26 DIAGNOSIS — E038 Other specified hypothyroidism: Secondary | ICD-10-CM | POA: Diagnosis not present

## 2022-01-27 DIAGNOSIS — F02B2 Dementia in other diseases classified elsewhere, moderate, with psychotic disturbance: Secondary | ICD-10-CM | POA: Diagnosis not present

## 2022-01-27 DIAGNOSIS — G301 Alzheimer's disease with late onset: Secondary | ICD-10-CM | POA: Diagnosis not present

## 2022-02-04 DIAGNOSIS — F02B2 Dementia in other diseases classified elsewhere, moderate, with psychotic disturbance: Secondary | ICD-10-CM | POA: Diagnosis not present

## 2022-02-04 DIAGNOSIS — G301 Alzheimer's disease with late onset: Secondary | ICD-10-CM | POA: Diagnosis not present

## 2022-02-04 DIAGNOSIS — E038 Other specified hypothyroidism: Secondary | ICD-10-CM | POA: Diagnosis not present

## 2022-02-04 DIAGNOSIS — H401130 Primary open-angle glaucoma, bilateral, stage unspecified: Secondary | ICD-10-CM | POA: Diagnosis not present

## 2022-02-05 DIAGNOSIS — E559 Vitamin D deficiency, unspecified: Secondary | ICD-10-CM | POA: Diagnosis not present

## 2022-02-05 DIAGNOSIS — Z79899 Other long term (current) drug therapy: Secondary | ICD-10-CM | POA: Diagnosis not present

## 2022-02-24 DIAGNOSIS — G301 Alzheimer's disease with late onset: Secondary | ICD-10-CM | POA: Diagnosis not present

## 2022-02-24 DIAGNOSIS — F02B2 Dementia in other diseases classified elsewhere, moderate, with psychotic disturbance: Secondary | ICD-10-CM | POA: Diagnosis not present

## 2022-03-01 DIAGNOSIS — H401113 Primary open-angle glaucoma, right eye, severe stage: Secondary | ICD-10-CM | POA: Diagnosis not present

## 2022-03-17 DIAGNOSIS — M79675 Pain in left toe(s): Secondary | ICD-10-CM | POA: Diagnosis not present

## 2022-03-17 DIAGNOSIS — H401130 Primary open-angle glaucoma, bilateral, stage unspecified: Secondary | ICD-10-CM | POA: Diagnosis not present

## 2022-03-17 DIAGNOSIS — E038 Other specified hypothyroidism: Secondary | ICD-10-CM | POA: Diagnosis not present

## 2022-03-17 DIAGNOSIS — E782 Mixed hyperlipidemia: Secondary | ICD-10-CM | POA: Diagnosis not present

## 2022-03-17 DIAGNOSIS — L600 Ingrowing nail: Secondary | ICD-10-CM | POA: Diagnosis not present

## 2022-03-17 DIAGNOSIS — F02B2 Dementia in other diseases classified elsewhere, moderate, with psychotic disturbance: Secondary | ICD-10-CM | POA: Diagnosis not present

## 2022-03-17 DIAGNOSIS — M79674 Pain in right toe(s): Secondary | ICD-10-CM | POA: Diagnosis not present

## 2022-03-17 DIAGNOSIS — G301 Alzheimer's disease with late onset: Secondary | ICD-10-CM | POA: Diagnosis not present

## 2022-03-22 ENCOUNTER — Telehealth: Payer: Self-pay | Admitting: Family Medicine

## 2022-03-22 NOTE — Telephone Encounter (Signed)
Left message for patient to call back and schedule Medicare Annual Wellness Visit (AWV) by phone ? ?Last AWV: 02/20/2020 ? ? ?Please schedule at anytime with [facility] Nurse Health Advisor 1 ? ? ?30 minute appointment for Virtual or phone ?45 minute appointment for Initial virtual/phone ? ? ? ?Any questions, please contact me at [phone]   ?

## 2022-03-24 DIAGNOSIS — G301 Alzheimer's disease with late onset: Secondary | ICD-10-CM | POA: Diagnosis not present

## 2022-03-24 DIAGNOSIS — F02B2 Dementia in other diseases classified elsewhere, moderate, with psychotic disturbance: Secondary | ICD-10-CM | POA: Diagnosis not present

## 2022-04-01 DIAGNOSIS — H401113 Primary open-angle glaucoma, right eye, severe stage: Secondary | ICD-10-CM | POA: Diagnosis not present

## 2022-04-07 DIAGNOSIS — F02B2 Dementia in other diseases classified elsewhere, moderate, with psychotic disturbance: Secondary | ICD-10-CM | POA: Diagnosis not present

## 2022-04-07 DIAGNOSIS — G301 Alzheimer's disease with late onset: Secondary | ICD-10-CM | POA: Diagnosis not present

## 2022-04-12 DIAGNOSIS — E038 Other specified hypothyroidism: Secondary | ICD-10-CM | POA: Diagnosis not present

## 2022-04-12 DIAGNOSIS — F02818 Dementia in other diseases classified elsewhere, unspecified severity, with other behavioral disturbance: Secondary | ICD-10-CM | POA: Diagnosis not present

## 2022-04-12 DIAGNOSIS — H401131 Primary open-angle glaucoma, bilateral, mild stage: Secondary | ICD-10-CM | POA: Diagnosis not present

## 2022-04-12 DIAGNOSIS — E782 Mixed hyperlipidemia: Secondary | ICD-10-CM | POA: Diagnosis not present

## 2022-04-12 DIAGNOSIS — G301 Alzheimer's disease with late onset: Secondary | ICD-10-CM | POA: Diagnosis not present

## 2022-04-14 DIAGNOSIS — H401130 Primary open-angle glaucoma, bilateral, stage unspecified: Secondary | ICD-10-CM | POA: Diagnosis not present

## 2022-04-14 DIAGNOSIS — E782 Mixed hyperlipidemia: Secondary | ICD-10-CM | POA: Diagnosis not present

## 2022-04-14 DIAGNOSIS — F02B2 Dementia in other diseases classified elsewhere, moderate, with psychotic disturbance: Secondary | ICD-10-CM | POA: Diagnosis not present

## 2022-04-14 DIAGNOSIS — G301 Alzheimer's disease with late onset: Secondary | ICD-10-CM | POA: Diagnosis not present

## 2022-04-14 DIAGNOSIS — E038 Other specified hypothyroidism: Secondary | ICD-10-CM | POA: Diagnosis not present

## 2022-04-16 ENCOUNTER — Telehealth: Payer: Self-pay | Admitting: Family Medicine

## 2022-04-16 DIAGNOSIS — E039 Hypothyroidism, unspecified: Secondary | ICD-10-CM | POA: Diagnosis not present

## 2022-04-16 NOTE — Telephone Encounter (Signed)
Spoke to grandson to schedule Medicare Annual Wellness Visit (AWV) either virtually or in office.   Her grandson stated patient is in SNF  Last AWV ; 02/20/20

## 2022-04-21 DIAGNOSIS — G301 Alzheimer's disease with late onset: Secondary | ICD-10-CM | POA: Diagnosis not present

## 2022-04-21 DIAGNOSIS — F02B2 Dementia in other diseases classified elsewhere, moderate, with psychotic disturbance: Secondary | ICD-10-CM | POA: Diagnosis not present

## 2022-04-28 DIAGNOSIS — E038 Other specified hypothyroidism: Secondary | ICD-10-CM | POA: Diagnosis not present

## 2022-04-28 DIAGNOSIS — F02B2 Dementia in other diseases classified elsewhere, moderate, with psychotic disturbance: Secondary | ICD-10-CM | POA: Diagnosis not present

## 2022-04-28 DIAGNOSIS — M160 Bilateral primary osteoarthritis of hip: Secondary | ICD-10-CM | POA: Diagnosis not present

## 2022-04-28 DIAGNOSIS — G301 Alzheimer's disease with late onset: Secondary | ICD-10-CM | POA: Diagnosis not present

## 2022-04-28 DIAGNOSIS — Z79899 Other long term (current) drug therapy: Secondary | ICD-10-CM | POA: Diagnosis not present

## 2022-04-28 DIAGNOSIS — H401130 Primary open-angle glaucoma, bilateral, stage unspecified: Secondary | ICD-10-CM | POA: Diagnosis not present

## 2022-04-30 DIAGNOSIS — E039 Hypothyroidism, unspecified: Secondary | ICD-10-CM | POA: Diagnosis not present

## 2022-04-30 DIAGNOSIS — M159 Polyosteoarthritis, unspecified: Secondary | ICD-10-CM | POA: Diagnosis not present

## 2022-04-30 DIAGNOSIS — F0283 Dementia in other diseases classified elsewhere, unspecified severity, with mood disturbance: Secondary | ICD-10-CM | POA: Diagnosis not present

## 2022-04-30 DIAGNOSIS — E785 Hyperlipidemia, unspecified: Secondary | ICD-10-CM | POA: Diagnosis not present

## 2022-04-30 DIAGNOSIS — I2510 Atherosclerotic heart disease of native coronary artery without angina pectoris: Secondary | ICD-10-CM | POA: Diagnosis not present

## 2022-05-04 DIAGNOSIS — E039 Hypothyroidism, unspecified: Secondary | ICD-10-CM | POA: Diagnosis not present

## 2022-05-04 DIAGNOSIS — E785 Hyperlipidemia, unspecified: Secondary | ICD-10-CM | POA: Diagnosis not present

## 2022-05-04 DIAGNOSIS — M159 Polyosteoarthritis, unspecified: Secondary | ICD-10-CM | POA: Diagnosis not present

## 2022-05-04 DIAGNOSIS — I2510 Atherosclerotic heart disease of native coronary artery without angina pectoris: Secondary | ICD-10-CM | POA: Diagnosis not present

## 2022-05-04 DIAGNOSIS — F0283 Dementia in other diseases classified elsewhere, unspecified severity, with mood disturbance: Secondary | ICD-10-CM | POA: Diagnosis not present

## 2022-05-05 DIAGNOSIS — G301 Alzheimer's disease with late onset: Secondary | ICD-10-CM | POA: Diagnosis not present

## 2022-05-05 DIAGNOSIS — F02B2 Dementia in other diseases classified elsewhere, moderate, with psychotic disturbance: Secondary | ICD-10-CM | POA: Diagnosis not present

## 2022-05-07 DIAGNOSIS — F0283 Dementia in other diseases classified elsewhere, unspecified severity, with mood disturbance: Secondary | ICD-10-CM | POA: Diagnosis not present

## 2022-05-07 DIAGNOSIS — E039 Hypothyroidism, unspecified: Secondary | ICD-10-CM | POA: Diagnosis not present

## 2022-05-07 DIAGNOSIS — M159 Polyosteoarthritis, unspecified: Secondary | ICD-10-CM | POA: Diagnosis not present

## 2022-05-07 DIAGNOSIS — I2510 Atherosclerotic heart disease of native coronary artery without angina pectoris: Secondary | ICD-10-CM | POA: Diagnosis not present

## 2022-05-07 DIAGNOSIS — E785 Hyperlipidemia, unspecified: Secondary | ICD-10-CM | POA: Diagnosis not present

## 2022-05-11 DIAGNOSIS — F0283 Dementia in other diseases classified elsewhere, unspecified severity, with mood disturbance: Secondary | ICD-10-CM | POA: Diagnosis not present

## 2022-05-11 DIAGNOSIS — E785 Hyperlipidemia, unspecified: Secondary | ICD-10-CM | POA: Diagnosis not present

## 2022-05-11 DIAGNOSIS — M159 Polyosteoarthritis, unspecified: Secondary | ICD-10-CM | POA: Diagnosis not present

## 2022-05-11 DIAGNOSIS — E039 Hypothyroidism, unspecified: Secondary | ICD-10-CM | POA: Diagnosis not present

## 2022-05-11 DIAGNOSIS — I2510 Atherosclerotic heart disease of native coronary artery without angina pectoris: Secondary | ICD-10-CM | POA: Diagnosis not present

## 2022-05-13 DIAGNOSIS — F02B2 Dementia in other diseases classified elsewhere, moderate, with psychotic disturbance: Secondary | ICD-10-CM | POA: Diagnosis not present

## 2022-05-13 DIAGNOSIS — I2510 Atherosclerotic heart disease of native coronary artery without angina pectoris: Secondary | ICD-10-CM | POA: Diagnosis not present

## 2022-05-13 DIAGNOSIS — E559 Vitamin D deficiency, unspecified: Secondary | ICD-10-CM | POA: Diagnosis not present

## 2022-05-13 DIAGNOSIS — E785 Hyperlipidemia, unspecified: Secondary | ICD-10-CM | POA: Diagnosis not present

## 2022-05-13 DIAGNOSIS — E039 Hypothyroidism, unspecified: Secondary | ICD-10-CM | POA: Diagnosis not present

## 2022-05-13 DIAGNOSIS — G301 Alzheimer's disease with late onset: Secondary | ICD-10-CM | POA: Diagnosis not present

## 2022-05-13 DIAGNOSIS — M160 Bilateral primary osteoarthritis of hip: Secondary | ICD-10-CM | POA: Diagnosis not present

## 2022-05-13 DIAGNOSIS — M159 Polyosteoarthritis, unspecified: Secondary | ICD-10-CM | POA: Diagnosis not present

## 2022-05-13 DIAGNOSIS — F0283 Dementia in other diseases classified elsewhere, unspecified severity, with mood disturbance: Secondary | ICD-10-CM | POA: Diagnosis not present

## 2022-05-17 DIAGNOSIS — M159 Polyosteoarthritis, unspecified: Secondary | ICD-10-CM | POA: Diagnosis not present

## 2022-05-17 DIAGNOSIS — F0283 Dementia in other diseases classified elsewhere, unspecified severity, with mood disturbance: Secondary | ICD-10-CM | POA: Diagnosis not present

## 2022-05-17 DIAGNOSIS — E039 Hypothyroidism, unspecified: Secondary | ICD-10-CM | POA: Diagnosis not present

## 2022-05-17 DIAGNOSIS — I2510 Atherosclerotic heart disease of native coronary artery without angina pectoris: Secondary | ICD-10-CM | POA: Diagnosis not present

## 2022-05-17 DIAGNOSIS — E785 Hyperlipidemia, unspecified: Secondary | ICD-10-CM | POA: Diagnosis not present

## 2022-05-19 DIAGNOSIS — H401130 Primary open-angle glaucoma, bilateral, stage unspecified: Secondary | ICD-10-CM | POA: Diagnosis not present

## 2022-05-19 DIAGNOSIS — F02B2 Dementia in other diseases classified elsewhere, moderate, with psychotic disturbance: Secondary | ICD-10-CM | POA: Diagnosis not present

## 2022-05-19 DIAGNOSIS — E039 Hypothyroidism, unspecified: Secondary | ICD-10-CM | POA: Diagnosis not present

## 2022-05-19 DIAGNOSIS — G301 Alzheimer's disease with late onset: Secondary | ICD-10-CM | POA: Diagnosis not present

## 2022-05-19 DIAGNOSIS — M160 Bilateral primary osteoarthritis of hip: Secondary | ICD-10-CM | POA: Diagnosis not present

## 2022-05-20 DIAGNOSIS — I2510 Atherosclerotic heart disease of native coronary artery without angina pectoris: Secondary | ICD-10-CM | POA: Diagnosis not present

## 2022-05-20 DIAGNOSIS — E785 Hyperlipidemia, unspecified: Secondary | ICD-10-CM | POA: Diagnosis not present

## 2022-05-20 DIAGNOSIS — F0283 Dementia in other diseases classified elsewhere, unspecified severity, with mood disturbance: Secondary | ICD-10-CM | POA: Diagnosis not present

## 2022-05-20 DIAGNOSIS — M159 Polyosteoarthritis, unspecified: Secondary | ICD-10-CM | POA: Diagnosis not present

## 2022-05-20 DIAGNOSIS — E039 Hypothyroidism, unspecified: Secondary | ICD-10-CM | POA: Diagnosis not present

## 2022-05-20 DIAGNOSIS — E089 Diabetes mellitus due to underlying condition without complications: Secondary | ICD-10-CM | POA: Diagnosis not present

## 2022-05-28 DIAGNOSIS — E039 Hypothyroidism, unspecified: Secondary | ICD-10-CM | POA: Diagnosis not present

## 2022-05-28 DIAGNOSIS — F0283 Dementia in other diseases classified elsewhere, unspecified severity, with mood disturbance: Secondary | ICD-10-CM | POA: Diagnosis not present

## 2022-05-28 DIAGNOSIS — E785 Hyperlipidemia, unspecified: Secondary | ICD-10-CM | POA: Diagnosis not present

## 2022-05-28 DIAGNOSIS — M159 Polyosteoarthritis, unspecified: Secondary | ICD-10-CM | POA: Diagnosis not present

## 2022-05-28 DIAGNOSIS — I2510 Atherosclerotic heart disease of native coronary artery without angina pectoris: Secondary | ICD-10-CM | POA: Diagnosis not present

## 2022-06-04 DIAGNOSIS — E039 Hypothyroidism, unspecified: Secondary | ICD-10-CM | POA: Diagnosis not present

## 2022-06-04 DIAGNOSIS — F0283 Dementia in other diseases classified elsewhere, unspecified severity, with mood disturbance: Secondary | ICD-10-CM | POA: Diagnosis not present

## 2022-06-04 DIAGNOSIS — E785 Hyperlipidemia, unspecified: Secondary | ICD-10-CM | POA: Diagnosis not present

## 2022-06-04 DIAGNOSIS — M159 Polyosteoarthritis, unspecified: Secondary | ICD-10-CM | POA: Diagnosis not present

## 2022-06-04 DIAGNOSIS — I2510 Atherosclerotic heart disease of native coronary artery without angina pectoris: Secondary | ICD-10-CM | POA: Diagnosis not present

## 2022-06-08 DIAGNOSIS — E785 Hyperlipidemia, unspecified: Secondary | ICD-10-CM | POA: Diagnosis not present

## 2022-06-08 DIAGNOSIS — F0283 Dementia in other diseases classified elsewhere, unspecified severity, with mood disturbance: Secondary | ICD-10-CM | POA: Diagnosis not present

## 2022-06-08 DIAGNOSIS — I2510 Atherosclerotic heart disease of native coronary artery without angina pectoris: Secondary | ICD-10-CM | POA: Diagnosis not present

## 2022-06-08 DIAGNOSIS — E039 Hypothyroidism, unspecified: Secondary | ICD-10-CM | POA: Diagnosis not present

## 2022-06-08 DIAGNOSIS — M159 Polyosteoarthritis, unspecified: Secondary | ICD-10-CM | POA: Diagnosis not present

## 2022-06-09 DIAGNOSIS — H401130 Primary open-angle glaucoma, bilateral, stage unspecified: Secondary | ICD-10-CM | POA: Diagnosis not present

## 2022-06-09 DIAGNOSIS — M160 Bilateral primary osteoarthritis of hip: Secondary | ICD-10-CM | POA: Diagnosis not present

## 2022-06-09 DIAGNOSIS — G301 Alzheimer's disease with late onset: Secondary | ICD-10-CM | POA: Diagnosis not present

## 2022-06-09 DIAGNOSIS — F02B2 Dementia in other diseases classified elsewhere, moderate, with psychotic disturbance: Secondary | ICD-10-CM | POA: Diagnosis not present

## 2022-06-18 DIAGNOSIS — I2510 Atherosclerotic heart disease of native coronary artery without angina pectoris: Secondary | ICD-10-CM | POA: Diagnosis not present

## 2022-06-18 DIAGNOSIS — M159 Polyosteoarthritis, unspecified: Secondary | ICD-10-CM | POA: Diagnosis not present

## 2022-06-18 DIAGNOSIS — F0283 Dementia in other diseases classified elsewhere, unspecified severity, with mood disturbance: Secondary | ICD-10-CM | POA: Diagnosis not present

## 2022-06-18 DIAGNOSIS — E785 Hyperlipidemia, unspecified: Secondary | ICD-10-CM | POA: Diagnosis not present

## 2022-06-18 DIAGNOSIS — E039 Hypothyroidism, unspecified: Secondary | ICD-10-CM | POA: Diagnosis not present

## 2022-06-21 DIAGNOSIS — H401113 Primary open-angle glaucoma, right eye, severe stage: Secondary | ICD-10-CM | POA: Diagnosis not present

## 2022-06-21 DIAGNOSIS — F0283 Dementia in other diseases classified elsewhere, unspecified severity, with mood disturbance: Secondary | ICD-10-CM | POA: Diagnosis not present

## 2022-06-21 DIAGNOSIS — I2510 Atherosclerotic heart disease of native coronary artery without angina pectoris: Secondary | ICD-10-CM | POA: Diagnosis not present

## 2022-06-21 DIAGNOSIS — M159 Polyosteoarthritis, unspecified: Secondary | ICD-10-CM | POA: Diagnosis not present

## 2022-06-21 DIAGNOSIS — E785 Hyperlipidemia, unspecified: Secondary | ICD-10-CM | POA: Diagnosis not present

## 2022-06-21 DIAGNOSIS — E039 Hypothyroidism, unspecified: Secondary | ICD-10-CM | POA: Diagnosis not present

## 2022-06-22 DIAGNOSIS — M159 Polyosteoarthritis, unspecified: Secondary | ICD-10-CM | POA: Diagnosis not present

## 2022-06-23 DIAGNOSIS — G301 Alzheimer's disease with late onset: Secondary | ICD-10-CM | POA: Diagnosis not present

## 2022-06-23 DIAGNOSIS — F02B2 Dementia in other diseases classified elsewhere, moderate, with psychotic disturbance: Secondary | ICD-10-CM | POA: Diagnosis not present

## 2022-06-28 DIAGNOSIS — E039 Hypothyroidism, unspecified: Secondary | ICD-10-CM | POA: Diagnosis not present

## 2022-06-28 DIAGNOSIS — M159 Polyosteoarthritis, unspecified: Secondary | ICD-10-CM | POA: Diagnosis not present

## 2022-06-28 DIAGNOSIS — E785 Hyperlipidemia, unspecified: Secondary | ICD-10-CM | POA: Diagnosis not present

## 2022-06-28 DIAGNOSIS — F0283 Dementia in other diseases classified elsewhere, unspecified severity, with mood disturbance: Secondary | ICD-10-CM | POA: Diagnosis not present

## 2022-06-28 DIAGNOSIS — I2510 Atherosclerotic heart disease of native coronary artery without angina pectoris: Secondary | ICD-10-CM | POA: Diagnosis not present

## 2022-06-30 DIAGNOSIS — M1611 Unilateral primary osteoarthritis, right hip: Secondary | ICD-10-CM | POA: Diagnosis not present

## 2022-06-30 DIAGNOSIS — E039 Hypothyroidism, unspecified: Secondary | ICD-10-CM | POA: Diagnosis not present

## 2022-06-30 DIAGNOSIS — G301 Alzheimer's disease with late onset: Secondary | ICD-10-CM | POA: Diagnosis not present

## 2022-06-30 DIAGNOSIS — M160 Bilateral primary osteoarthritis of hip: Secondary | ICD-10-CM | POA: Diagnosis not present

## 2022-06-30 DIAGNOSIS — M25551 Pain in right hip: Secondary | ICD-10-CM | POA: Diagnosis not present

## 2022-06-30 DIAGNOSIS — F02B2 Dementia in other diseases classified elsewhere, moderate, with psychotic disturbance: Secondary | ICD-10-CM | POA: Diagnosis not present

## 2022-07-05 DIAGNOSIS — E039 Hypothyroidism, unspecified: Secondary | ICD-10-CM | POA: Diagnosis not present

## 2022-07-05 DIAGNOSIS — M159 Polyosteoarthritis, unspecified: Secondary | ICD-10-CM | POA: Diagnosis not present

## 2022-07-05 DIAGNOSIS — E785 Hyperlipidemia, unspecified: Secondary | ICD-10-CM | POA: Diagnosis not present

## 2022-07-05 DIAGNOSIS — I2510 Atherosclerotic heart disease of native coronary artery without angina pectoris: Secondary | ICD-10-CM | POA: Diagnosis not present

## 2022-07-05 DIAGNOSIS — Z9181 History of falling: Secondary | ICD-10-CM | POA: Diagnosis not present

## 2022-07-07 DIAGNOSIS — Z9181 History of falling: Secondary | ICD-10-CM | POA: Diagnosis not present

## 2022-07-07 DIAGNOSIS — I2510 Atherosclerotic heart disease of native coronary artery without angina pectoris: Secondary | ICD-10-CM | POA: Diagnosis not present

## 2022-07-07 DIAGNOSIS — M159 Polyosteoarthritis, unspecified: Secondary | ICD-10-CM | POA: Diagnosis not present

## 2022-07-07 DIAGNOSIS — E039 Hypothyroidism, unspecified: Secondary | ICD-10-CM | POA: Diagnosis not present

## 2022-07-07 DIAGNOSIS — E785 Hyperlipidemia, unspecified: Secondary | ICD-10-CM | POA: Diagnosis not present

## 2022-07-12 DIAGNOSIS — Z9181 History of falling: Secondary | ICD-10-CM | POA: Diagnosis not present

## 2022-07-12 DIAGNOSIS — E782 Mixed hyperlipidemia: Secondary | ICD-10-CM | POA: Diagnosis not present

## 2022-07-12 DIAGNOSIS — I2510 Atherosclerotic heart disease of native coronary artery without angina pectoris: Secondary | ICD-10-CM | POA: Diagnosis not present

## 2022-07-12 DIAGNOSIS — H401131 Primary open-angle glaucoma, bilateral, mild stage: Secondary | ICD-10-CM | POA: Diagnosis not present

## 2022-07-12 DIAGNOSIS — E785 Hyperlipidemia, unspecified: Secondary | ICD-10-CM | POA: Diagnosis not present

## 2022-07-12 DIAGNOSIS — E038 Other specified hypothyroidism: Secondary | ICD-10-CM | POA: Diagnosis not present

## 2022-07-12 DIAGNOSIS — M159 Polyosteoarthritis, unspecified: Secondary | ICD-10-CM | POA: Diagnosis not present

## 2022-07-12 DIAGNOSIS — G301 Alzheimer's disease with late onset: Secondary | ICD-10-CM | POA: Diagnosis not present

## 2022-07-12 DIAGNOSIS — E039 Hypothyroidism, unspecified: Secondary | ICD-10-CM | POA: Diagnosis not present

## 2022-07-14 DIAGNOSIS — Z9181 History of falling: Secondary | ICD-10-CM | POA: Diagnosis not present

## 2022-07-14 DIAGNOSIS — M159 Polyosteoarthritis, unspecified: Secondary | ICD-10-CM | POA: Diagnosis not present

## 2022-07-14 DIAGNOSIS — E039 Hypothyroidism, unspecified: Secondary | ICD-10-CM | POA: Diagnosis not present

## 2022-07-14 DIAGNOSIS — I2510 Atherosclerotic heart disease of native coronary artery without angina pectoris: Secondary | ICD-10-CM | POA: Diagnosis not present

## 2022-07-14 DIAGNOSIS — E785 Hyperlipidemia, unspecified: Secondary | ICD-10-CM | POA: Diagnosis not present

## 2022-07-19 DIAGNOSIS — E785 Hyperlipidemia, unspecified: Secondary | ICD-10-CM | POA: Diagnosis not present

## 2022-07-19 DIAGNOSIS — I2510 Atherosclerotic heart disease of native coronary artery without angina pectoris: Secondary | ICD-10-CM | POA: Diagnosis not present

## 2022-07-19 DIAGNOSIS — E039 Hypothyroidism, unspecified: Secondary | ICD-10-CM | POA: Diagnosis not present

## 2022-07-19 DIAGNOSIS — Z9181 History of falling: Secondary | ICD-10-CM | POA: Diagnosis not present

## 2022-07-19 DIAGNOSIS — M159 Polyosteoarthritis, unspecified: Secondary | ICD-10-CM | POA: Diagnosis not present

## 2022-07-21 DIAGNOSIS — Z9181 History of falling: Secondary | ICD-10-CM | POA: Diagnosis not present

## 2022-07-21 DIAGNOSIS — E785 Hyperlipidemia, unspecified: Secondary | ICD-10-CM | POA: Diagnosis not present

## 2022-07-21 DIAGNOSIS — M159 Polyosteoarthritis, unspecified: Secondary | ICD-10-CM | POA: Diagnosis not present

## 2022-07-21 DIAGNOSIS — I2510 Atherosclerotic heart disease of native coronary artery without angina pectoris: Secondary | ICD-10-CM | POA: Diagnosis not present

## 2022-07-21 DIAGNOSIS — E039 Hypothyroidism, unspecified: Secondary | ICD-10-CM | POA: Diagnosis not present

## 2022-07-21 DIAGNOSIS — G301 Alzheimer's disease with late onset: Secondary | ICD-10-CM | POA: Diagnosis not present

## 2022-07-21 DIAGNOSIS — F02B2 Dementia in other diseases classified elsewhere, moderate, with psychotic disturbance: Secondary | ICD-10-CM | POA: Diagnosis not present

## 2022-07-21 DIAGNOSIS — M160 Bilateral primary osteoarthritis of hip: Secondary | ICD-10-CM | POA: Diagnosis not present

## 2022-07-21 DIAGNOSIS — H401130 Primary open-angle glaucoma, bilateral, stage unspecified: Secondary | ICD-10-CM | POA: Diagnosis not present

## 2022-07-22 DIAGNOSIS — Z79899 Other long term (current) drug therapy: Secondary | ICD-10-CM | POA: Diagnosis not present

## 2022-07-27 DIAGNOSIS — M159 Polyosteoarthritis, unspecified: Secondary | ICD-10-CM | POA: Diagnosis not present

## 2022-07-27 DIAGNOSIS — E039 Hypothyroidism, unspecified: Secondary | ICD-10-CM | POA: Diagnosis not present

## 2022-07-27 DIAGNOSIS — G894 Chronic pain syndrome: Secondary | ICD-10-CM | POA: Diagnosis not present

## 2022-07-27 DIAGNOSIS — I2510 Atherosclerotic heart disease of native coronary artery without angina pectoris: Secondary | ICD-10-CM | POA: Diagnosis not present

## 2022-07-27 DIAGNOSIS — Z9181 History of falling: Secondary | ICD-10-CM | POA: Diagnosis not present

## 2022-07-27 DIAGNOSIS — E785 Hyperlipidemia, unspecified: Secondary | ICD-10-CM | POA: Diagnosis not present

## 2022-07-28 DIAGNOSIS — M160 Bilateral primary osteoarthritis of hip: Secondary | ICD-10-CM | POA: Diagnosis not present

## 2022-07-28 DIAGNOSIS — G894 Chronic pain syndrome: Secondary | ICD-10-CM | POA: Diagnosis not present

## 2022-07-28 DIAGNOSIS — G301 Alzheimer's disease with late onset: Secondary | ICD-10-CM | POA: Diagnosis not present

## 2022-07-28 DIAGNOSIS — F02B2 Dementia in other diseases classified elsewhere, moderate, with psychotic disturbance: Secondary | ICD-10-CM | POA: Diagnosis not present

## 2022-08-03 DIAGNOSIS — Z9181 History of falling: Secondary | ICD-10-CM | POA: Diagnosis not present

## 2022-08-03 DIAGNOSIS — I2510 Atherosclerotic heart disease of native coronary artery without angina pectoris: Secondary | ICD-10-CM | POA: Diagnosis not present

## 2022-08-03 DIAGNOSIS — M159 Polyosteoarthritis, unspecified: Secondary | ICD-10-CM | POA: Diagnosis not present

## 2022-08-03 DIAGNOSIS — E039 Hypothyroidism, unspecified: Secondary | ICD-10-CM | POA: Diagnosis not present

## 2022-08-03 DIAGNOSIS — E785 Hyperlipidemia, unspecified: Secondary | ICD-10-CM | POA: Diagnosis not present

## 2022-08-04 DIAGNOSIS — G301 Alzheimer's disease with late onset: Secondary | ICD-10-CM | POA: Diagnosis not present

## 2022-08-04 DIAGNOSIS — F02B2 Dementia in other diseases classified elsewhere, moderate, with psychotic disturbance: Secondary | ICD-10-CM | POA: Diagnosis not present

## 2022-08-05 DIAGNOSIS — M159 Polyosteoarthritis, unspecified: Secondary | ICD-10-CM | POA: Diagnosis not present

## 2022-08-05 DIAGNOSIS — E039 Hypothyroidism, unspecified: Secondary | ICD-10-CM | POA: Diagnosis not present

## 2022-08-18 DIAGNOSIS — G301 Alzheimer's disease with late onset: Secondary | ICD-10-CM | POA: Diagnosis not present

## 2022-08-18 DIAGNOSIS — F02B2 Dementia in other diseases classified elsewhere, moderate, with psychotic disturbance: Secondary | ICD-10-CM | POA: Diagnosis not present

## 2022-08-25 DIAGNOSIS — M79674 Pain in right toe(s): Secondary | ICD-10-CM | POA: Diagnosis not present

## 2022-08-25 DIAGNOSIS — G894 Chronic pain syndrome: Secondary | ICD-10-CM | POA: Diagnosis not present

## 2022-08-25 DIAGNOSIS — F02B2 Dementia in other diseases classified elsewhere, moderate, with psychotic disturbance: Secondary | ICD-10-CM | POA: Diagnosis not present

## 2022-08-25 DIAGNOSIS — G301 Alzheimer's disease with late onset: Secondary | ICD-10-CM | POA: Diagnosis not present

## 2022-08-25 DIAGNOSIS — M79675 Pain in left toe(s): Secondary | ICD-10-CM | POA: Diagnosis not present

## 2022-08-25 DIAGNOSIS — M159 Polyosteoarthritis, unspecified: Secondary | ICD-10-CM | POA: Diagnosis not present

## 2022-08-25 DIAGNOSIS — R300 Dysuria: Secondary | ICD-10-CM | POA: Diagnosis not present

## 2022-08-25 DIAGNOSIS — L600 Ingrowing nail: Secondary | ICD-10-CM | POA: Diagnosis not present

## 2022-08-26 DIAGNOSIS — M159 Polyosteoarthritis, unspecified: Secondary | ICD-10-CM | POA: Diagnosis not present

## 2022-08-26 DIAGNOSIS — G894 Chronic pain syndrome: Secondary | ICD-10-CM | POA: Diagnosis not present

## 2022-09-01 DIAGNOSIS — G301 Alzheimer's disease with late onset: Secondary | ICD-10-CM | POA: Diagnosis not present

## 2022-09-01 DIAGNOSIS — F02B2 Dementia in other diseases classified elsewhere, moderate, with psychotic disturbance: Secondary | ICD-10-CM | POA: Diagnosis not present

## 2022-09-15 DIAGNOSIS — F02B2 Dementia in other diseases classified elsewhere, moderate, with psychotic disturbance: Secondary | ICD-10-CM | POA: Diagnosis not present

## 2022-09-15 DIAGNOSIS — G301 Alzheimer's disease with late onset: Secondary | ICD-10-CM | POA: Diagnosis not present

## 2022-09-22 DIAGNOSIS — G894 Chronic pain syndrome: Secondary | ICD-10-CM | POA: Diagnosis not present

## 2022-09-22 DIAGNOSIS — G301 Alzheimer's disease with late onset: Secondary | ICD-10-CM | POA: Diagnosis not present

## 2022-09-22 DIAGNOSIS — M159 Polyosteoarthritis, unspecified: Secondary | ICD-10-CM | POA: Diagnosis not present

## 2022-09-22 DIAGNOSIS — F02B2 Dementia in other diseases classified elsewhere, moderate, with psychotic disturbance: Secondary | ICD-10-CM | POA: Diagnosis not present

## 2022-09-23 DIAGNOSIS — M1990 Unspecified osteoarthritis, unspecified site: Secondary | ICD-10-CM | POA: Diagnosis not present

## 2022-09-28 DIAGNOSIS — M159 Polyosteoarthritis, unspecified: Secondary | ICD-10-CM | POA: Diagnosis not present

## 2022-09-28 DIAGNOSIS — G894 Chronic pain syndrome: Secondary | ICD-10-CM | POA: Diagnosis not present

## 2022-10-20 DIAGNOSIS — G301 Alzheimer's disease with late onset: Secondary | ICD-10-CM | POA: Diagnosis not present

## 2022-10-20 DIAGNOSIS — F02B2 Dementia in other diseases classified elsewhere, moderate, with psychotic disturbance: Secondary | ICD-10-CM | POA: Diagnosis not present

## 2022-10-24 DIAGNOSIS — M1990 Unspecified osteoarthritis, unspecified site: Secondary | ICD-10-CM | POA: Diagnosis not present

## 2022-10-27 DIAGNOSIS — F02B2 Dementia in other diseases classified elsewhere, moderate, with psychotic disturbance: Secondary | ICD-10-CM | POA: Diagnosis not present

## 2022-10-27 DIAGNOSIS — G301 Alzheimer's disease with late onset: Secondary | ICD-10-CM | POA: Diagnosis not present

## 2022-10-27 DIAGNOSIS — H401130 Primary open-angle glaucoma, bilateral, stage unspecified: Secondary | ICD-10-CM | POA: Diagnosis not present

## 2022-10-27 DIAGNOSIS — E039 Hypothyroidism, unspecified: Secondary | ICD-10-CM | POA: Diagnosis not present

## 2022-10-27 DIAGNOSIS — M159 Polyosteoarthritis, unspecified: Secondary | ICD-10-CM | POA: Diagnosis not present

## 2022-11-03 DIAGNOSIS — G301 Alzheimer's disease with late onset: Secondary | ICD-10-CM | POA: Diagnosis not present

## 2022-11-03 DIAGNOSIS — M159 Polyosteoarthritis, unspecified: Secondary | ICD-10-CM | POA: Diagnosis not present

## 2022-11-03 DIAGNOSIS — G894 Chronic pain syndrome: Secondary | ICD-10-CM | POA: Diagnosis not present

## 2022-11-03 DIAGNOSIS — F02B2 Dementia in other diseases classified elsewhere, moderate, with psychotic disturbance: Secondary | ICD-10-CM | POA: Diagnosis not present

## 2022-11-17 DIAGNOSIS — F02B2 Dementia in other diseases classified elsewhere, moderate, with psychotic disturbance: Secondary | ICD-10-CM | POA: Diagnosis not present

## 2022-11-17 DIAGNOSIS — G301 Alzheimer's disease with late onset: Secondary | ICD-10-CM | POA: Diagnosis not present

## 2022-11-23 DIAGNOSIS — M159 Polyosteoarthritis, unspecified: Secondary | ICD-10-CM | POA: Diagnosis not present

## 2022-11-23 DIAGNOSIS — M1990 Unspecified osteoarthritis, unspecified site: Secondary | ICD-10-CM | POA: Diagnosis not present

## 2022-11-23 DIAGNOSIS — W010XXA Fall on same level from slipping, tripping and stumbling without subsequent striking against object, initial encounter: Secondary | ICD-10-CM | POA: Diagnosis not present

## 2022-11-23 DIAGNOSIS — G894 Chronic pain syndrome: Secondary | ICD-10-CM | POA: Diagnosis not present

## 2022-11-23 DIAGNOSIS — G301 Alzheimer's disease with late onset: Secondary | ICD-10-CM | POA: Diagnosis not present

## 2022-11-24 DIAGNOSIS — R109 Unspecified abdominal pain: Secondary | ICD-10-CM | POA: Diagnosis not present

## 2022-11-25 DIAGNOSIS — R5383 Other fatigue: Secondary | ICD-10-CM | POA: Diagnosis not present

## 2022-11-25 DIAGNOSIS — M79674 Pain in right toe(s): Secondary | ICD-10-CM | POA: Diagnosis not present

## 2022-11-25 DIAGNOSIS — L600 Ingrowing nail: Secondary | ICD-10-CM | POA: Diagnosis not present

## 2022-11-25 DIAGNOSIS — M79675 Pain in left toe(s): Secondary | ICD-10-CM | POA: Diagnosis not present

## 2022-12-01 DIAGNOSIS — E039 Hypothyroidism, unspecified: Secondary | ICD-10-CM | POA: Diagnosis not present

## 2022-12-01 DIAGNOSIS — H401130 Primary open-angle glaucoma, bilateral, stage unspecified: Secondary | ICD-10-CM | POA: Diagnosis not present

## 2022-12-01 DIAGNOSIS — M159 Polyosteoarthritis, unspecified: Secondary | ICD-10-CM | POA: Diagnosis not present

## 2022-12-01 DIAGNOSIS — G301 Alzheimer's disease with late onset: Secondary | ICD-10-CM | POA: Diagnosis not present

## 2022-12-01 DIAGNOSIS — F02B2 Dementia in other diseases classified elsewhere, moderate, with psychotic disturbance: Secondary | ICD-10-CM | POA: Diagnosis not present

## 2022-12-01 DIAGNOSIS — H1033 Unspecified acute conjunctivitis, bilateral: Secondary | ICD-10-CM | POA: Diagnosis not present

## 2022-12-08 DIAGNOSIS — G301 Alzheimer's disease with late onset: Secondary | ICD-10-CM | POA: Diagnosis not present

## 2022-12-08 DIAGNOSIS — F02B2 Dementia in other diseases classified elsewhere, moderate, with psychotic disturbance: Secondary | ICD-10-CM | POA: Diagnosis not present

## 2022-12-08 DIAGNOSIS — G894 Chronic pain syndrome: Secondary | ICD-10-CM | POA: Diagnosis not present

## 2022-12-08 DIAGNOSIS — M159 Polyosteoarthritis, unspecified: Secondary | ICD-10-CM | POA: Diagnosis not present

## 2022-12-15 DIAGNOSIS — G4700 Insomnia, unspecified: Secondary | ICD-10-CM | POA: Diagnosis not present

## 2022-12-15 DIAGNOSIS — F02B2 Dementia in other diseases classified elsewhere, moderate, with psychotic disturbance: Secondary | ICD-10-CM | POA: Diagnosis not present

## 2022-12-15 DIAGNOSIS — G301 Alzheimer's disease with late onset: Secondary | ICD-10-CM | POA: Diagnosis not present

## 2022-12-23 DIAGNOSIS — R300 Dysuria: Secondary | ICD-10-CM | POA: Diagnosis not present

## 2022-12-24 DIAGNOSIS — M1990 Unspecified osteoarthritis, unspecified site: Secondary | ICD-10-CM | POA: Diagnosis not present

## 2022-12-28 DIAGNOSIS — G894 Chronic pain syndrome: Secondary | ICD-10-CM | POA: Diagnosis not present

## 2022-12-28 DIAGNOSIS — M159 Polyosteoarthritis, unspecified: Secondary | ICD-10-CM | POA: Diagnosis not present

## 2022-12-29 DIAGNOSIS — F02B2 Dementia in other diseases classified elsewhere, moderate, with psychotic disturbance: Secondary | ICD-10-CM | POA: Diagnosis not present

## 2022-12-29 DIAGNOSIS — M159 Polyosteoarthritis, unspecified: Secondary | ICD-10-CM | POA: Diagnosis not present

## 2022-12-29 DIAGNOSIS — G301 Alzheimer's disease with late onset: Secondary | ICD-10-CM | POA: Diagnosis not present

## 2022-12-29 DIAGNOSIS — W19XXXA Unspecified fall, initial encounter: Secondary | ICD-10-CM | POA: Diagnosis not present

## 2022-12-30 ENCOUNTER — Ambulatory Visit (INDEPENDENT_AMBULATORY_CARE_PROVIDER_SITE_OTHER): Payer: Medicare Other | Admitting: Family Medicine

## 2022-12-30 ENCOUNTER — Encounter: Payer: Self-pay | Admitting: Family Medicine

## 2022-12-30 VITALS — BP 118/52 | HR 66 | Ht 63.0 in | Wt 147.0 lb

## 2022-12-30 DIAGNOSIS — E782 Mixed hyperlipidemia: Secondary | ICD-10-CM | POA: Diagnosis not present

## 2022-12-30 DIAGNOSIS — H401110 Primary open-angle glaucoma, right eye, stage unspecified: Secondary | ICD-10-CM

## 2022-12-30 DIAGNOSIS — Z23 Encounter for immunization: Secondary | ICD-10-CM

## 2022-12-30 DIAGNOSIS — Z0000 Encounter for general adult medical examination without abnormal findings: Secondary | ICD-10-CM | POA: Diagnosis not present

## 2022-12-30 DIAGNOSIS — Z9181 History of falling: Secondary | ICD-10-CM

## 2022-12-30 DIAGNOSIS — G301 Alzheimer's disease with late onset: Secondary | ICD-10-CM

## 2022-12-30 DIAGNOSIS — M160 Bilateral primary osteoarthritis of hip: Secondary | ICD-10-CM | POA: Diagnosis not present

## 2022-12-30 DIAGNOSIS — F02C0 Dementia in other diseases classified elsewhere, severe, without behavioral disturbance, psychotic disturbance, mood disturbance, and anxiety: Secondary | ICD-10-CM | POA: Diagnosis not present

## 2022-12-30 DIAGNOSIS — E039 Hypothyroidism, unspecified: Secondary | ICD-10-CM

## 2022-12-31 LAB — COMPREHENSIVE METABOLIC PANEL
ALT: 14 U/L (ref 0–35)
AST: 21 U/L (ref 0–37)
Albumin: 4.2 g/dL (ref 3.5–5.2)
Alkaline Phosphatase: 114 U/L (ref 39–117)
BUN: 13 mg/dL (ref 6–23)
CO2: 28 mEq/L (ref 19–32)
Calcium: 9.6 mg/dL (ref 8.4–10.5)
Chloride: 99 mEq/L (ref 96–112)
Creatinine, Ser: 0.77 mg/dL (ref 0.40–1.20)
GFR: 68.79 mL/min (ref >60.00)
Glucose, Bld: 79 mg/dL (ref 70–99)
Potassium: 4.2 mEq/L (ref 3.5–5.1)
Sodium: 138 mEq/L (ref 135–145)
Total Bilirubin: 0.5 mg/dL (ref 0.2–1.2)
Total Protein: 7.9 g/dL (ref 6.0–8.3)

## 2022-12-31 LAB — CBC WITH DIFFERENTIAL/PLATELET
Basophils Absolute: 0.1 10*3/uL (ref 0.0–0.1)
Basophils Relative: 2.3 % (ref 0.0–3.0)
Eosinophils Absolute: 0.2 10*3/uL (ref 0.0–0.7)
Eosinophils Relative: 3 % (ref 0.0–5.0)
HCT: 42.4 % (ref 36.0–46.0)
Hemoglobin: 14.3 g/dL (ref 12.0–15.0)
Lymphocytes Relative: 24.5 % (ref 12.0–46.0)
Lymphs Abs: 1.5 10*3/uL (ref 0.7–4.0)
MCHC: 33.7 g/dL (ref 30.0–36.0)
MCV: 91.1 fl (ref 78.0–100.0)
Monocytes Absolute: 0.5 10*3/uL (ref 0.1–1.0)
Monocytes Relative: 8.8 % (ref 3.0–12.0)
Neutro Abs: 3.8 10*3/uL (ref 1.4–7.7)
Neutrophils Relative %: 61.4 % (ref 43.0–77.0)
Platelets: 359 10*3/uL (ref 150.0–400.0)
RBC: 4.65 Mil/uL (ref 3.87–5.11)
RDW: 14.7 % (ref 11.5–15.5)
WBC: 6.2 10*3/uL (ref 4.0–10.5)

## 2022-12-31 LAB — LIPID PANEL
Cholesterol: 193 mg/dL (ref 0–200)
HDL: 82.1 mg/dL (ref >39.00)
LDL Cholesterol: 95 mg/dL (ref 0–99)
NonHDL: 110.79
Total CHOL/HDL Ratio: 2
Triglycerides: 81 mg/dL (ref 0.0–149.0)
VLDL: 16.2 mg/dL (ref 0.0–40.0)

## 2022-12-31 LAB — TSH: TSH: 36.23 u[IU]/mL — ABNORMAL HIGH (ref 0.35–5.50)

## 2022-12-31 LAB — VITAMIN D 25 HYDROXY (VIT D DEFICIENCY, FRACTURES): VITD: 32.06 ng/mL (ref 30.00–100.00)

## 2022-12-31 LAB — VITAMIN B12: Vitamin B-12: 364 pg/mL (ref 211–911)

## 2023-01-05 DIAGNOSIS — M159 Polyosteoarthritis, unspecified: Secondary | ICD-10-CM | POA: Diagnosis not present

## 2023-01-05 DIAGNOSIS — F0283 Dementia in other diseases classified elsewhere, unspecified severity, with mood disturbance: Secondary | ICD-10-CM | POA: Diagnosis not present

## 2023-01-05 DIAGNOSIS — H401130 Primary open-angle glaucoma, bilateral, stage unspecified: Secondary | ICD-10-CM | POA: Diagnosis not present

## 2023-01-05 DIAGNOSIS — G301 Alzheimer's disease with late onset: Secondary | ICD-10-CM | POA: Diagnosis not present

## 2023-01-05 DIAGNOSIS — M160 Bilateral primary osteoarthritis of hip: Secondary | ICD-10-CM | POA: Diagnosis not present

## 2023-01-05 DIAGNOSIS — F02818 Dementia in other diseases classified elsewhere, unspecified severity, with other behavioral disturbance: Secondary | ICD-10-CM | POA: Diagnosis not present

## 2023-01-05 DIAGNOSIS — I2510 Atherosclerotic heart disease of native coronary artery without angina pectoris: Secondary | ICD-10-CM | POA: Diagnosis not present

## 2023-01-05 DIAGNOSIS — F02B2 Dementia in other diseases classified elsewhere, moderate, with psychotic disturbance: Secondary | ICD-10-CM | POA: Diagnosis not present

## 2023-01-05 DIAGNOSIS — E039 Hypothyroidism, unspecified: Secondary | ICD-10-CM | POA: Diagnosis not present

## 2023-01-11 DIAGNOSIS — M160 Bilateral primary osteoarthritis of hip: Secondary | ICD-10-CM | POA: Diagnosis not present

## 2023-01-11 DIAGNOSIS — E039 Hypothyroidism, unspecified: Secondary | ICD-10-CM | POA: Diagnosis not present

## 2023-01-11 DIAGNOSIS — F0283 Dementia in other diseases classified elsewhere, unspecified severity, with mood disturbance: Secondary | ICD-10-CM | POA: Diagnosis not present

## 2023-01-11 DIAGNOSIS — I2510 Atherosclerotic heart disease of native coronary artery without angina pectoris: Secondary | ICD-10-CM | POA: Diagnosis not present

## 2023-01-11 DIAGNOSIS — F02818 Dementia in other diseases classified elsewhere, unspecified severity, with other behavioral disturbance: Secondary | ICD-10-CM | POA: Diagnosis not present

## 2023-01-12 DIAGNOSIS — F02B2 Dementia in other diseases classified elsewhere, moderate, with psychotic disturbance: Secondary | ICD-10-CM | POA: Diagnosis not present

## 2023-01-12 DIAGNOSIS — G894 Chronic pain syndrome: Secondary | ICD-10-CM | POA: Diagnosis not present

## 2023-01-12 DIAGNOSIS — I2510 Atherosclerotic heart disease of native coronary artery without angina pectoris: Secondary | ICD-10-CM | POA: Diagnosis not present

## 2023-01-12 DIAGNOSIS — G301 Alzheimer's disease with late onset: Secondary | ICD-10-CM | POA: Diagnosis not present

## 2023-01-12 DIAGNOSIS — F0283 Dementia in other diseases classified elsewhere, unspecified severity, with mood disturbance: Secondary | ICD-10-CM | POA: Diagnosis not present

## 2023-01-12 DIAGNOSIS — G4700 Insomnia, unspecified: Secondary | ICD-10-CM | POA: Diagnosis not present

## 2023-01-12 DIAGNOSIS — M159 Polyosteoarthritis, unspecified: Secondary | ICD-10-CM | POA: Diagnosis not present

## 2023-01-12 DIAGNOSIS — E039 Hypothyroidism, unspecified: Secondary | ICD-10-CM | POA: Diagnosis not present

## 2023-01-12 DIAGNOSIS — F02818 Dementia in other diseases classified elsewhere, unspecified severity, with other behavioral disturbance: Secondary | ICD-10-CM | POA: Diagnosis not present

## 2023-01-12 DIAGNOSIS — M160 Bilateral primary osteoarthritis of hip: Secondary | ICD-10-CM | POA: Diagnosis not present

## 2023-01-13 DIAGNOSIS — M160 Bilateral primary osteoarthritis of hip: Secondary | ICD-10-CM | POA: Diagnosis not present

## 2023-01-13 DIAGNOSIS — E039 Hypothyroidism, unspecified: Secondary | ICD-10-CM | POA: Diagnosis not present

## 2023-01-13 DIAGNOSIS — F02818 Dementia in other diseases classified elsewhere, unspecified severity, with other behavioral disturbance: Secondary | ICD-10-CM | POA: Diagnosis not present

## 2023-01-13 DIAGNOSIS — I2510 Atherosclerotic heart disease of native coronary artery without angina pectoris: Secondary | ICD-10-CM | POA: Diagnosis not present

## 2023-01-13 DIAGNOSIS — F0283 Dementia in other diseases classified elsewhere, unspecified severity, with mood disturbance: Secondary | ICD-10-CM | POA: Diagnosis not present

## 2023-01-17 DIAGNOSIS — F02818 Dementia in other diseases classified elsewhere, unspecified severity, with other behavioral disturbance: Secondary | ICD-10-CM | POA: Diagnosis not present

## 2023-01-17 DIAGNOSIS — F0283 Dementia in other diseases classified elsewhere, unspecified severity, with mood disturbance: Secondary | ICD-10-CM | POA: Diagnosis not present

## 2023-01-17 DIAGNOSIS — E039 Hypothyroidism, unspecified: Secondary | ICD-10-CM | POA: Diagnosis not present

## 2023-01-17 DIAGNOSIS — M160 Bilateral primary osteoarthritis of hip: Secondary | ICD-10-CM | POA: Diagnosis not present

## 2023-01-17 DIAGNOSIS — I2510 Atherosclerotic heart disease of native coronary artery without angina pectoris: Secondary | ICD-10-CM | POA: Diagnosis not present

## 2023-01-18 DIAGNOSIS — I2510 Atherosclerotic heart disease of native coronary artery without angina pectoris: Secondary | ICD-10-CM | POA: Diagnosis not present

## 2023-01-18 DIAGNOSIS — M160 Bilateral primary osteoarthritis of hip: Secondary | ICD-10-CM | POA: Diagnosis not present

## 2023-01-18 DIAGNOSIS — F0283 Dementia in other diseases classified elsewhere, unspecified severity, with mood disturbance: Secondary | ICD-10-CM | POA: Diagnosis not present

## 2023-01-18 DIAGNOSIS — F02818 Dementia in other diseases classified elsewhere, unspecified severity, with other behavioral disturbance: Secondary | ICD-10-CM | POA: Diagnosis not present

## 2023-01-18 DIAGNOSIS — E039 Hypothyroidism, unspecified: Secondary | ICD-10-CM | POA: Diagnosis not present

## 2023-01-19 DIAGNOSIS — F02B2 Dementia in other diseases classified elsewhere, moderate, with psychotic disturbance: Secondary | ICD-10-CM | POA: Diagnosis not present

## 2023-01-19 DIAGNOSIS — G301 Alzheimer's disease with late onset: Secondary | ICD-10-CM | POA: Diagnosis not present

## 2023-01-20 DIAGNOSIS — I2510 Atherosclerotic heart disease of native coronary artery without angina pectoris: Secondary | ICD-10-CM | POA: Diagnosis not present

## 2023-01-20 DIAGNOSIS — E039 Hypothyroidism, unspecified: Secondary | ICD-10-CM | POA: Diagnosis not present

## 2023-01-20 DIAGNOSIS — F0283 Dementia in other diseases classified elsewhere, unspecified severity, with mood disturbance: Secondary | ICD-10-CM | POA: Diagnosis not present

## 2023-01-20 DIAGNOSIS — M160 Bilateral primary osteoarthritis of hip: Secondary | ICD-10-CM | POA: Diagnosis not present

## 2023-01-20 DIAGNOSIS — F02818 Dementia in other diseases classified elsewhere, unspecified severity, with other behavioral disturbance: Secondary | ICD-10-CM | POA: Diagnosis not present

## 2023-01-21 DIAGNOSIS — E039 Hypothyroidism, unspecified: Secondary | ICD-10-CM | POA: Diagnosis not present

## 2023-01-21 DIAGNOSIS — M160 Bilateral primary osteoarthritis of hip: Secondary | ICD-10-CM | POA: Diagnosis not present

## 2023-01-21 DIAGNOSIS — F02818 Dementia in other diseases classified elsewhere, unspecified severity, with other behavioral disturbance: Secondary | ICD-10-CM | POA: Diagnosis not present

## 2023-01-21 DIAGNOSIS — F0283 Dementia in other diseases classified elsewhere, unspecified severity, with mood disturbance: Secondary | ICD-10-CM | POA: Diagnosis not present

## 2023-01-21 DIAGNOSIS — Z79899 Other long term (current) drug therapy: Secondary | ICD-10-CM | POA: Diagnosis not present

## 2023-01-21 DIAGNOSIS — I2510 Atherosclerotic heart disease of native coronary artery without angina pectoris: Secondary | ICD-10-CM | POA: Diagnosis not present

## 2023-01-21 DIAGNOSIS — E782 Mixed hyperlipidemia: Secondary | ICD-10-CM | POA: Diagnosis not present

## 2023-01-21 DIAGNOSIS — E559 Vitamin D deficiency, unspecified: Secondary | ICD-10-CM | POA: Diagnosis not present

## 2023-01-22 NOTE — Progress Notes (Addendum)
Established Patient Office Visit   Subjective  Patient ID: Sabrina Hart, female    DOB: 1932-04-05  Age: 89 y.o. MRN: [id number]  Chief Complaint  Patient presents with   Annual Exam  Patient accompanied by her daughter.  Patient is an 89 year old female with pmh sig for hypothyroidism, dementia, HLD who is seen for CPE.  Pt currently residing in [facility] but will be going back home due to cost.  Pt seen intermittently by Clide Deutscher, PA at the facility.  History obtained from patient's daughter.  Recently seen by ophthalmology for right eye edema.  Diagnosed with open-angle glaucoma.  Had surgery with Dr. Gillian Scarce at [facility] ophthalmology.  Still having right eye swelling.  Pt status post several falls due to leaning forward.  Also seen by Dr. Legrand Como at [facility] for osteoarthritis of bilateral hips.  Had an injection in August.  No surgery planned 2/2 patient's history of dementia.      Review of Systems  Unable to perform ROS: Dementia   Negative unless stated above    Objective:     BP (!) 118/52 (BP Location: Left Arm, Cuff Size: Large)   Pulse 66   Ht '5\' 3"'$  (1.6 m)   Wt 147 lb (66.7 kg)   SpO2 99%   BMI 26.04 kg/m    Physical Exam Constitutional:      Appearance: Normal appearance.  HENT:     Head: Normocephalic and atraumatic.     Right Ear: Tympanic membrane, ear canal and external ear normal.     Left Ear: Tympanic membrane, ear canal and external ear normal.     Nose: Nose normal.     Mouth/Throat:     Mouth: Mucous membranes are moist.     Pharynx: No oropharyngeal exudate or posterior oropharyngeal erythema.  Eyes:     General: No scleral icterus.    Extraocular Movements: Extraocular movements intact.     Conjunctiva/sclera:     Right eye: Right conjunctiva is injected.     Pupils: Pupils are equal, round, and reactive to light.     Comments: Edema of right eye with erythema and tearing of eyeball.   Neck:     Thyroid:  No thyromegaly.  Cardiovascular:     Rate and Rhythm: Normal rate and regular rhythm.     Pulses: Normal pulses.     Heart sounds: Normal heart sounds. No murmur heard.    No friction rub.  Pulmonary:     Effort: Pulmonary effort is normal.     Breath sounds: Normal breath sounds. No wheezing, rhonchi or rales.  Abdominal:     General: Bowel sounds are normal.     Palpations: Abdomen is soft.     Tenderness: There is no abdominal tenderness.  Musculoskeletal:        General: No deformity. Normal range of motion.     Comments: Gait not assessed as sitting in transport wheelchair.  Lymphadenopathy:     Cervical: No cervical adenopathy.  Skin:    General: Skin is warm and dry.     Findings: No lesion.  Neurological:     General: No focal deficit present.     Mental Status: She is alert and oriented to person, place, and time.  Psychiatric:        Mood and Affect: Mood normal.        Thought Content: Thought content normal.      Results for orders placed or performed  in visit on 12/30/22  CBC with Differential/Platelet  Result Value Ref Range   WBC 6.2 4.0 - 10.5 K/uL   RBC 4.65 3.87 - 5.11 Mil/uL   Hemoglobin 14.3 12.0 - 15.0 g/dL   HCT 42.4 36.0 - 46.0 %   MCV 91.1 78.0 - 100.0 fl   MCHC 33.7 30.0 - 36.0 g/dL   RDW 14.7 11.5 - 15.5 %   Platelets 359.0 150.0 - 400.0 K/uL   Neutrophils Relative % 61.4 43.0 - 77.0 %   Lymphocytes Relative 24.5 12.0 - 46.0 %   Monocytes Relative 8.8 3.0 - 12.0 %   Eosinophils Relative 3.0 0.0 - 5.0 %   Basophils Relative 2.3 0.0 - 3.0 %   Neutro Abs 3.8 1.4 - 7.7 K/uL   Lymphs Abs 1.5 0.7 - 4.0 K/uL   Monocytes Absolute 0.5 0.1 - 1.0 K/uL   Eosinophils Absolute 0.2 0.0 - 0.7 K/uL   Basophils Absolute 0.1 0.0 - 0.1 K/uL  Comprehensive metabolic panel  Result Value Ref Range   Sodium 138 135 - 145 mEq/L   Potassium 4.2 3.5 - 5.1 mEq/L   Chloride 99 96 - 112 mEq/L   CO2 28 19 - 32 mEq/L   Glucose, Bld 79 70 - 99 mg/dL   BUN 13 6 - 23  mg/dL   Creatinine, Ser 0.77 0.40 - 1.20 mg/dL   Total Bilirubin 0.5 0.2 - 1.2 mg/dL   Alkaline Phosphatase 114 39 - 117 U/L   AST 21 0 - 37 U/L   ALT 14 0 - 35 U/L   Total Protein 7.9 6.0 - 8.3 g/dL   Albumin 4.2 3.5 - 5.2 g/dL   GFR 68.79 >60.00 mL/min   Calcium 9.6 8.4 - 10.5 mg/dL  Lipid panel  Result Value Ref Range   Cholesterol 193 0 - 200 mg/dL   Triglycerides 81.0 0.0 - 149.0 mg/dL   HDL 82.10 >39.00 mg/dL   VLDL 16.2 0.0 - 40.0 mg/dL   LDL Cholesterol 95 0 - 99 mg/dL   Total CHOL/HDL Ratio 2    NonHDL 110.79   TSH  Result Value Ref Range   TSH 36.23 (H) 0.35 - 5.50 uIU/mL  Vitamin D, 25-hydroxy  Result Value Ref Range   VITD 32.06 30.00 - 100.00 ng/mL  Vitamin B12  Result Value Ref Range   Vitamin B-12 364 211 - 911 pg/mL      Assessment & Plan:  Well adult exam -Anticipatory guidance given including wearing seatbelts, smoke detectors in the home, increasing physical activity, increasing p.o. intake of water and vegetables. -labs -colonoscopy, mammogram, and pap not indicated 2/2 age. -will provide med refills as needed while pt transitions from SNF back home. -given handout -schedule AWV prn -next CPE in 1 yr -     VITAMIN D 25 Hydroxy (Vit-D Deficiency, Fractures) -     Vitamin B12  Mixed hyperlipidemia -total cholesterol 284, LDL 150, triglycerides 199, HDL 94 on 2/30/22 -continue lipitor 40 mg daily -     CBC with Differential/Platelet -     Comprehensive metabolic panel -     Lipid panel  Acquired hypothyroidism -continue synthroid 150 mcg. -Advised on the importance of taking the med first thing in the morning prior to other medications or eating. -     TSH  Severe late onset Alzheimer's dementia without behavioral disturbance, psychotic disturbance, mood disturbance, or anxiety (HCC) -stable -continue aricept 5 mg  -     For home use only DME  lightweight manual wheelchair with seat cushion  Need for Tdap vaccination -     Tdap vaccine  greater than or equal to 7yo IM  Primary open angle glaucoma of right eye, unspecified glaucoma stage -s/p laser surgery -continue current eye gtts -continue f/u with Ophthalmology  Primary osteoarthritis of both hips -s/p steroid injection August 2023 -continue f/u with Ortho, Dr. Legrand Como at [place] -     For home use only DME lightweight manual wheelchair with seat cushion  At high risk for falls -     For home use only DME lightweight manual wheelchair with seat cushion    Return if symptoms worsen or fail to improve.   Billie Ruddy, MD

## 2023-01-24 DIAGNOSIS — M1990 Unspecified osteoarthritis, unspecified site: Secondary | ICD-10-CM | POA: Diagnosis not present

## 2023-01-24 DIAGNOSIS — M160 Bilateral primary osteoarthritis of hip: Secondary | ICD-10-CM | POA: Diagnosis not present

## 2023-01-24 DIAGNOSIS — I2510 Atherosclerotic heart disease of native coronary artery without angina pectoris: Secondary | ICD-10-CM | POA: Diagnosis not present

## 2023-01-24 DIAGNOSIS — F0283 Dementia in other diseases classified elsewhere, unspecified severity, with mood disturbance: Secondary | ICD-10-CM | POA: Diagnosis not present

## 2023-01-24 DIAGNOSIS — F02818 Dementia in other diseases classified elsewhere, unspecified severity, with other behavioral disturbance: Secondary | ICD-10-CM | POA: Diagnosis not present

## 2023-01-24 DIAGNOSIS — E039 Hypothyroidism, unspecified: Secondary | ICD-10-CM | POA: Diagnosis not present

## 2023-01-26 DIAGNOSIS — I10 Essential (primary) hypertension: Secondary | ICD-10-CM | POA: Diagnosis not present

## 2023-01-26 DIAGNOSIS — I2510 Atherosclerotic heart disease of native coronary artery without angina pectoris: Secondary | ICD-10-CM | POA: Diagnosis not present

## 2023-01-26 DIAGNOSIS — F0283 Dementia in other diseases classified elsewhere, unspecified severity, with mood disturbance: Secondary | ICD-10-CM | POA: Diagnosis not present

## 2023-01-26 DIAGNOSIS — M160 Bilateral primary osteoarthritis of hip: Secondary | ICD-10-CM | POA: Diagnosis not present

## 2023-01-26 DIAGNOSIS — F02B2 Dementia in other diseases classified elsewhere, moderate, with psychotic disturbance: Secondary | ICD-10-CM | POA: Diagnosis not present

## 2023-01-26 DIAGNOSIS — F02818 Dementia in other diseases classified elsewhere, unspecified severity, with other behavioral disturbance: Secondary | ICD-10-CM | POA: Diagnosis not present

## 2023-01-26 DIAGNOSIS — G301 Alzheimer's disease with late onset: Secondary | ICD-10-CM | POA: Diagnosis not present

## 2023-01-26 DIAGNOSIS — E039 Hypothyroidism, unspecified: Secondary | ICD-10-CM | POA: Diagnosis not present

## 2023-01-28 DIAGNOSIS — E039 Hypothyroidism, unspecified: Secondary | ICD-10-CM | POA: Diagnosis not present

## 2023-01-28 DIAGNOSIS — M160 Bilateral primary osteoarthritis of hip: Secondary | ICD-10-CM | POA: Diagnosis not present

## 2023-01-28 DIAGNOSIS — I2510 Atherosclerotic heart disease of native coronary artery without angina pectoris: Secondary | ICD-10-CM | POA: Diagnosis not present

## 2023-02-01 DIAGNOSIS — F0283 Dementia in other diseases classified elsewhere, unspecified severity, with mood disturbance: Secondary | ICD-10-CM | POA: Diagnosis not present

## 2023-02-01 DIAGNOSIS — I2510 Atherosclerotic heart disease of native coronary artery without angina pectoris: Secondary | ICD-10-CM | POA: Diagnosis not present

## 2023-02-01 DIAGNOSIS — F02818 Dementia in other diseases classified elsewhere, unspecified severity, with other behavioral disturbance: Secondary | ICD-10-CM | POA: Diagnosis not present

## 2023-02-01 DIAGNOSIS — E039 Hypothyroidism, unspecified: Secondary | ICD-10-CM | POA: Diagnosis not present

## 2023-02-01 DIAGNOSIS — M160 Bilateral primary osteoarthritis of hip: Secondary | ICD-10-CM | POA: Diagnosis not present

## 2023-02-02 DIAGNOSIS — F02B2 Dementia in other diseases classified elsewhere, moderate, with psychotic disturbance: Secondary | ICD-10-CM | POA: Diagnosis not present

## 2023-02-02 DIAGNOSIS — I10 Essential (primary) hypertension: Secondary | ICD-10-CM | POA: Diagnosis not present

## 2023-02-02 DIAGNOSIS — H401130 Primary open-angle glaucoma, bilateral, stage unspecified: Secondary | ICD-10-CM | POA: Diagnosis not present

## 2023-02-02 DIAGNOSIS — M159 Polyosteoarthritis, unspecified: Secondary | ICD-10-CM | POA: Diagnosis not present

## 2023-02-02 DIAGNOSIS — E039 Hypothyroidism, unspecified: Secondary | ICD-10-CM | POA: Diagnosis not present

## 2023-02-02 DIAGNOSIS — G301 Alzheimer's disease with late onset: Secondary | ICD-10-CM | POA: Diagnosis not present

## 2023-02-03 DIAGNOSIS — I2510 Atherosclerotic heart disease of native coronary artery without angina pectoris: Secondary | ICD-10-CM | POA: Diagnosis not present

## 2023-02-03 DIAGNOSIS — M160 Bilateral primary osteoarthritis of hip: Secondary | ICD-10-CM | POA: Diagnosis not present

## 2023-02-03 DIAGNOSIS — E039 Hypothyroidism, unspecified: Secondary | ICD-10-CM | POA: Diagnosis not present

## 2023-02-03 DIAGNOSIS — F0283 Dementia in other diseases classified elsewhere, unspecified severity, with mood disturbance: Secondary | ICD-10-CM | POA: Diagnosis not present

## 2023-02-03 DIAGNOSIS — F02818 Dementia in other diseases classified elsewhere, unspecified severity, with other behavioral disturbance: Secondary | ICD-10-CM | POA: Diagnosis not present

## 2023-02-05 IMAGING — DX DG HIP (WITH OR WITHOUT PELVIS) 2-3V*R*
2 series · 2 of 2 positions shown · non-contrast
Comparison: Pelvis radiograph 01/02/2014

CLINICAL DATA: Decreasing right hip pain affecting ambulation.

EXAM:
DG HIP (WITH OR WITHOUT PELVIS) 2-3V RIGHT

[pelvis ap]
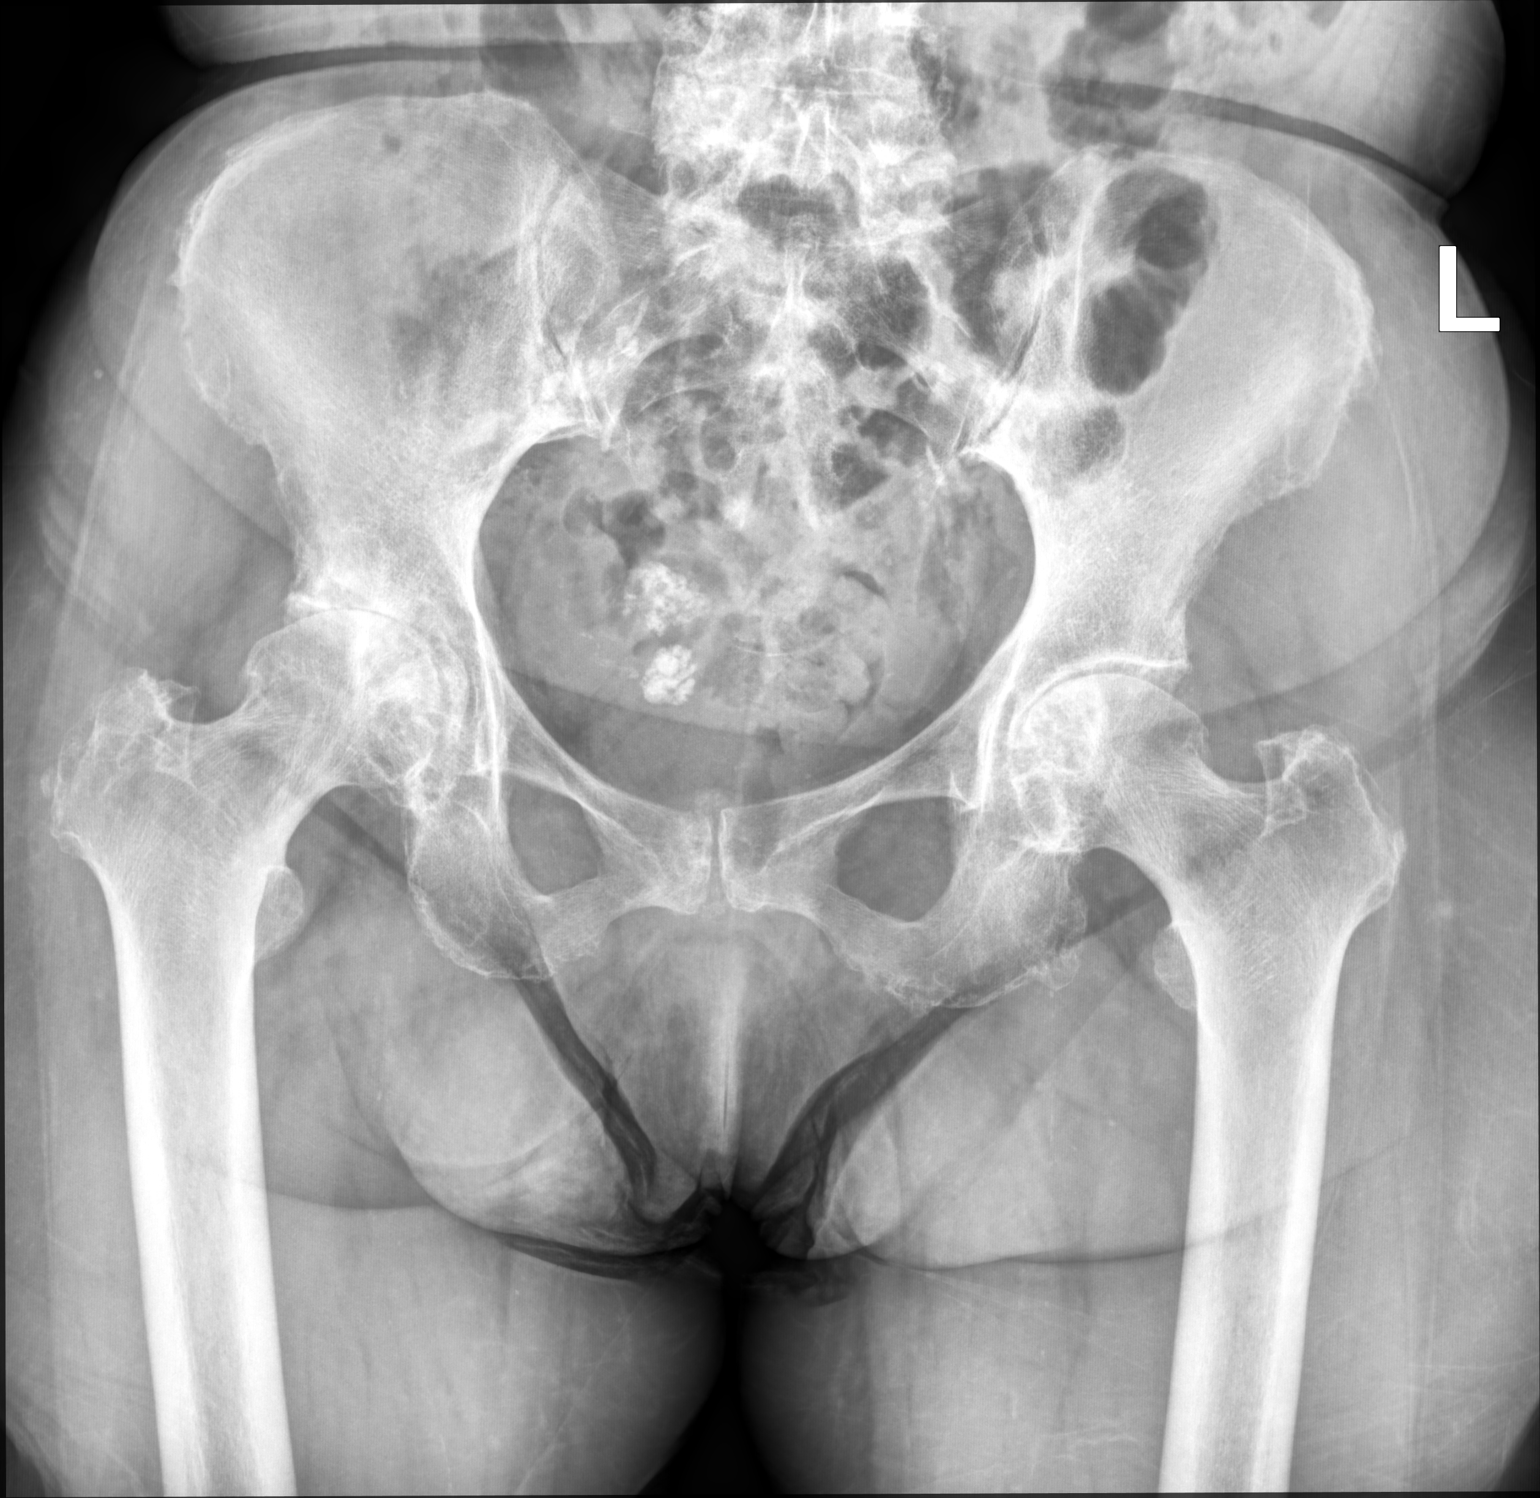

[hip (frog leg) lat]
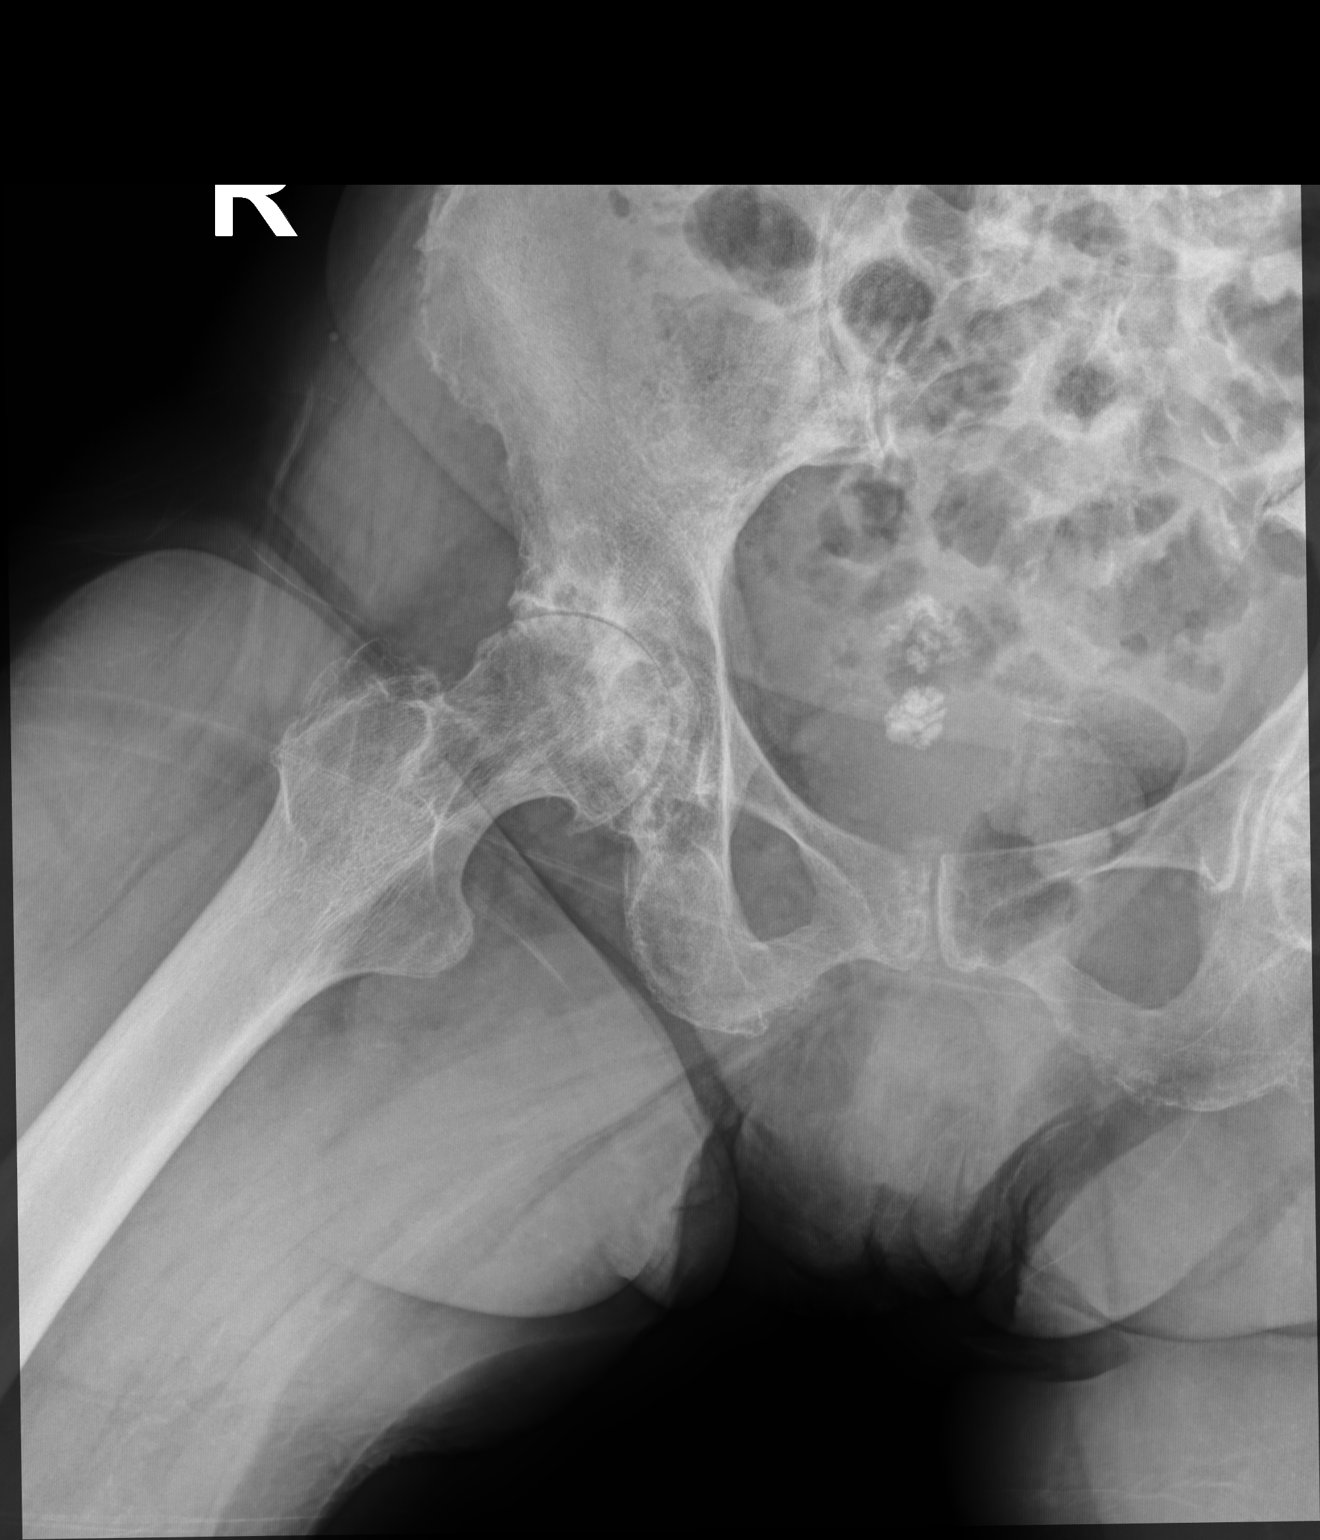

[2 of 2 positions shown; findings below may reference images not displayed]

FINDINGS: Progressive right hip osteoarthritis from prior exam. Near complete
joint space loss with subchondral cystic change and osteophytes. No
evidence of avascular necrosis or femoral head collapse. Moderate
left hip osteoarthritis has also slightly progressed. Pubic rami are
intact. Pubic symphysis and sacroiliac joints are congruent. No
evidence of focal bone lesion or erosion. Calcifications in the
pelvis are typical of fibroids.
IMPRESSION: 1. Severe right hip osteoarthritis, progressed from 2829.
2. Moderate left hip osteoarthritis also progressed.

## 2023-02-07 DIAGNOSIS — F0283 Dementia in other diseases classified elsewhere, unspecified severity, with mood disturbance: Secondary | ICD-10-CM | POA: Diagnosis not present

## 2023-02-07 DIAGNOSIS — F02818 Dementia in other diseases classified elsewhere, unspecified severity, with other behavioral disturbance: Secondary | ICD-10-CM | POA: Diagnosis not present

## 2023-02-07 DIAGNOSIS — I2510 Atherosclerotic heart disease of native coronary artery without angina pectoris: Secondary | ICD-10-CM | POA: Diagnosis not present

## 2023-02-07 DIAGNOSIS — E039 Hypothyroidism, unspecified: Secondary | ICD-10-CM | POA: Diagnosis not present

## 2023-02-07 DIAGNOSIS — M160 Bilateral primary osteoarthritis of hip: Secondary | ICD-10-CM | POA: Diagnosis not present

## 2023-02-09 DIAGNOSIS — M160 Bilateral primary osteoarthritis of hip: Secondary | ICD-10-CM | POA: Diagnosis not present

## 2023-02-09 DIAGNOSIS — F02818 Dementia in other diseases classified elsewhere, unspecified severity, with other behavioral disturbance: Secondary | ICD-10-CM | POA: Diagnosis not present

## 2023-02-09 DIAGNOSIS — E039 Hypothyroidism, unspecified: Secondary | ICD-10-CM | POA: Diagnosis not present

## 2023-02-09 DIAGNOSIS — I2510 Atherosclerotic heart disease of native coronary artery without angina pectoris: Secondary | ICD-10-CM | POA: Diagnosis not present

## 2023-02-09 DIAGNOSIS — F0283 Dementia in other diseases classified elsewhere, unspecified severity, with mood disturbance: Secondary | ICD-10-CM | POA: Diagnosis not present

## 2023-02-11 DIAGNOSIS — E785 Hyperlipidemia, unspecified: Secondary | ICD-10-CM | POA: Diagnosis not present

## 2023-02-11 DIAGNOSIS — I10 Essential (primary) hypertension: Secondary | ICD-10-CM | POA: Diagnosis not present

## 2023-02-16 DIAGNOSIS — F02B2 Dementia in other diseases classified elsewhere, moderate, with psychotic disturbance: Secondary | ICD-10-CM | POA: Diagnosis not present

## 2023-02-16 DIAGNOSIS — G4700 Insomnia, unspecified: Secondary | ICD-10-CM | POA: Diagnosis not present

## 2023-02-16 DIAGNOSIS — I2510 Atherosclerotic heart disease of native coronary artery without angina pectoris: Secondary | ICD-10-CM | POA: Diagnosis not present

## 2023-02-16 DIAGNOSIS — M159 Polyosteoarthritis, unspecified: Secondary | ICD-10-CM | POA: Diagnosis not present

## 2023-02-16 DIAGNOSIS — F02818 Dementia in other diseases classified elsewhere, unspecified severity, with other behavioral disturbance: Secondary | ICD-10-CM | POA: Diagnosis not present

## 2023-02-16 DIAGNOSIS — G894 Chronic pain syndrome: Secondary | ICD-10-CM | POA: Diagnosis not present

## 2023-02-16 DIAGNOSIS — E039 Hypothyroidism, unspecified: Secondary | ICD-10-CM | POA: Diagnosis not present

## 2023-02-16 DIAGNOSIS — F0283 Dementia in other diseases classified elsewhere, unspecified severity, with mood disturbance: Secondary | ICD-10-CM | POA: Diagnosis not present

## 2023-02-16 DIAGNOSIS — G301 Alzheimer's disease with late onset: Secondary | ICD-10-CM | POA: Diagnosis not present

## 2023-02-16 DIAGNOSIS — M160 Bilateral primary osteoarthritis of hip: Secondary | ICD-10-CM | POA: Diagnosis not present

## 2023-02-18 DIAGNOSIS — F02818 Dementia in other diseases classified elsewhere, unspecified severity, with other behavioral disturbance: Secondary | ICD-10-CM | POA: Diagnosis not present

## 2023-02-18 DIAGNOSIS — F0283 Dementia in other diseases classified elsewhere, unspecified severity, with mood disturbance: Secondary | ICD-10-CM | POA: Diagnosis not present

## 2023-02-18 DIAGNOSIS — I2510 Atherosclerotic heart disease of native coronary artery without angina pectoris: Secondary | ICD-10-CM | POA: Diagnosis not present

## 2023-02-18 DIAGNOSIS — M160 Bilateral primary osteoarthritis of hip: Secondary | ICD-10-CM | POA: Diagnosis not present

## 2023-02-18 DIAGNOSIS — E039 Hypothyroidism, unspecified: Secondary | ICD-10-CM | POA: Diagnosis not present

## 2023-02-21 DIAGNOSIS — F0283 Dementia in other diseases classified elsewhere, unspecified severity, with mood disturbance: Secondary | ICD-10-CM | POA: Diagnosis not present

## 2023-02-21 DIAGNOSIS — I2510 Atherosclerotic heart disease of native coronary artery without angina pectoris: Secondary | ICD-10-CM | POA: Diagnosis not present

## 2023-02-21 DIAGNOSIS — F02818 Dementia in other diseases classified elsewhere, unspecified severity, with other behavioral disturbance: Secondary | ICD-10-CM | POA: Diagnosis not present

## 2023-02-21 DIAGNOSIS — M160 Bilateral primary osteoarthritis of hip: Secondary | ICD-10-CM | POA: Diagnosis not present

## 2023-02-21 DIAGNOSIS — E039 Hypothyroidism, unspecified: Secondary | ICD-10-CM | POA: Diagnosis not present

## 2023-02-22 DIAGNOSIS — M1990 Unspecified osteoarthritis, unspecified site: Secondary | ICD-10-CM | POA: Diagnosis not present

## 2023-02-24 DIAGNOSIS — E039 Hypothyroidism, unspecified: Secondary | ICD-10-CM | POA: Diagnosis not present

## 2023-02-24 DIAGNOSIS — F0283 Dementia in other diseases classified elsewhere, unspecified severity, with mood disturbance: Secondary | ICD-10-CM | POA: Diagnosis not present

## 2023-02-24 DIAGNOSIS — F02818 Dementia in other diseases classified elsewhere, unspecified severity, with other behavioral disturbance: Secondary | ICD-10-CM | POA: Diagnosis not present

## 2023-02-24 DIAGNOSIS — I2510 Atherosclerotic heart disease of native coronary artery without angina pectoris: Secondary | ICD-10-CM | POA: Diagnosis not present

## 2023-02-24 DIAGNOSIS — M160 Bilateral primary osteoarthritis of hip: Secondary | ICD-10-CM | POA: Diagnosis not present

## 2023-02-24 DIAGNOSIS — I10 Essential (primary) hypertension: Secondary | ICD-10-CM | POA: Diagnosis not present

## 2023-03-03 ENCOUNTER — Telehealth: Payer: Self-pay | Admitting: Family Medicine

## 2023-03-03 DIAGNOSIS — I2510 Atherosclerotic heart disease of native coronary artery without angina pectoris: Secondary | ICD-10-CM | POA: Diagnosis not present

## 2023-03-03 DIAGNOSIS — F02818 Dementia in other diseases classified elsewhere, unspecified severity, with other behavioral disturbance: Secondary | ICD-10-CM | POA: Diagnosis not present

## 2023-03-03 DIAGNOSIS — F0283 Dementia in other diseases classified elsewhere, unspecified severity, with mood disturbance: Secondary | ICD-10-CM | POA: Diagnosis not present

## 2023-03-03 DIAGNOSIS — E039 Hypothyroidism, unspecified: Secondary | ICD-10-CM | POA: Diagnosis not present

## 2023-03-03 DIAGNOSIS — M160 Bilateral primary osteoarthritis of hip: Secondary | ICD-10-CM | POA: Diagnosis not present

## 2023-03-03 NOTE — Telephone Encounter (Signed)
[place] with [facility] to leave a detailed message on this line.  PT - Verbal Order  Continue Home Health PT  1 x week for 6 wks   Also,  Please fax an Rx for a sliding board

## 2023-03-04 NOTE — Telephone Encounter (Signed)
Ok

## 2023-03-04 NOTE — Telephone Encounter (Signed)
Physical Therapist called to FU on these Verbal Orders.

## 2023-03-07 NOTE — Telephone Encounter (Signed)
Attempted to contact Sabrina Hart. Left a detail message- 'ok' for VO and to call us give if have any questions.

## 2023-03-09 ENCOUNTER — Ambulatory Visit (INDEPENDENT_AMBULATORY_CARE_PROVIDER_SITE_OTHER): Payer: Medicare Other | Admitting: Family Medicine

## 2023-03-09 ENCOUNTER — Encounter: Payer: Self-pay | Admitting: Family Medicine

## 2023-03-09 VITALS — BP 120/84 | HR 58 | Temp 98.4°F | Ht 63.0 in

## 2023-03-09 DIAGNOSIS — I10 Essential (primary) hypertension: Secondary | ICD-10-CM

## 2023-03-09 DIAGNOSIS — R32 Unspecified urinary incontinence: Secondary | ICD-10-CM

## 2023-03-09 DIAGNOSIS — E782 Mixed hyperlipidemia: Secondary | ICD-10-CM

## 2023-03-09 DIAGNOSIS — F02818 Dementia in other diseases classified elsewhere, unspecified severity, with other behavioral disturbance: Secondary | ICD-10-CM | POA: Diagnosis not present

## 2023-03-09 DIAGNOSIS — F02C18 Dementia in other diseases classified elsewhere, severe, with other behavioral disturbance: Secondary | ICD-10-CM | POA: Diagnosis not present

## 2023-03-09 DIAGNOSIS — E039 Hypothyroidism, unspecified: Secondary | ICD-10-CM | POA: Diagnosis not present

## 2023-03-09 DIAGNOSIS — I2510 Atherosclerotic heart disease of native coronary artery without angina pectoris: Secondary | ICD-10-CM | POA: Diagnosis not present

## 2023-03-09 DIAGNOSIS — Z029 Encounter for administrative examinations, unspecified: Secondary | ICD-10-CM

## 2023-03-09 DIAGNOSIS — G301 Alzheimer's disease with late onset: Secondary | ICD-10-CM

## 2023-03-09 DIAGNOSIS — M160 Bilateral primary osteoarthritis of hip: Secondary | ICD-10-CM | POA: Diagnosis not present

## 2023-03-09 DIAGNOSIS — F0283 Dementia in other diseases classified elsewhere, unspecified severity, with mood disturbance: Secondary | ICD-10-CM | POA: Diagnosis not present

## 2023-03-09 MED ORDER — LEVOTHYROXINE SODIUM 88 MCG PO TABS: 88.0000 ug | ORAL_TABLET | Freq: Every morning | ORAL | 3 refills | Status: DC

## 2023-03-09 MED ORDER — AMLODIPINE BESYLATE 5 MG PO TABS: 5.0000 mg | ORAL_TABLET | Freq: Every day | ORAL | 3 refills | Status: DC

## 2023-03-09 MED ORDER — PROCARE ADULT BRIEFS X-LARGE MISC: 11 refills | Status: DC

## 2023-03-09 MED ORDER — SERTRALINE HCL 25 MG PO TABS: 25.0000 mg | ORAL_TABLET | Freq: Every day | ORAL | 1 refills | Status: DC

## 2023-03-09 MED ORDER — LORAZEPAM 0.5 MG PO TABS: 0.5000 mg | ORAL_TABLET | Freq: Every evening | ORAL | 2 refills | Status: DC | PRN

## 2023-03-09 NOTE — Progress Notes (Signed)
Established Patient Office Visit   Subjective  Patient ID: Sabrina Hart, female    DOB: 1931-12-20  Age: 89 y.o. MRN: [id number]  Chief Complaint  Patient presents with   Follow-up  Pt accompanied by her daughter.  Pt is an 89 yo female w/ pmh sig for acquired hypothyroidism, Alzheimer's dementia with behavioral disturbances, OA who is seen for follow-up.  Patient recently discharged from [facility] memory unit.  Patient's daughter states she needs refills on medications.  While at facility risperidone stopped working as patient became aggressive.  Patient was started on lorazepam 0.5 mg for agitation.  Typically given daily at 4 PM.  Patient also taking Norvasc 5 mg daily, sertraline 25 mg daily, levothyroxine 88 mcg daily, Lipitor 40 mg daily and melatonin 3 mg nightly.  Patient's daughter inquires about a DNR form.  Patient has always been adamant about not want to be on a ventilator/other invasive measures.  She would want chest compressions.    Inquiring about prescription for adult briefs.  Currently in PT once weekly x 7 weeks.  Family friends help patient's daughter care for patient at times.  Patient is with her states she was advised to ask about increasing patient's Norco and/or getting epidural injections for chronic pain.   Patient Active Problem List   Diagnosis Date Noted   Dementia without behavioral disturbance 04/12/2019   Lower extremity edema 04/26/2017   Unstable angina 03/02/2013   Hypothyroidism 09/29/2012   Hyperlipidemia 09/29/2012   Hypokalemia 09/29/2012   CAD (coronary artery disease) 09/27/2012   Social History   Tobacco Use   Smoking status: Never   Smokeless tobacco: Never  Vaping Use   Vaping Use: Never used  Substance Use Topics   Alcohol use: Yes    Alcohol/week: 1.0 standard drink of alcohol    Types: 1 Glasses of wine per week    Comment: 09/28/2012 "1 glass of wine averages once/wk; beer average once/week during summer w/friends"    Drug use: No   History reviewed. No pertinent family history. Allergies  Allergen Reactions   Other Other (See Comments)    Metals; "skin gets weepy; gets worse if it makes contact" (09/28/2012)   Pollen Extract Other (See Comments)    "sneezing; watery eyes"   Latex Itching and Swelling    Skin swells   Morphine And Related Rash   Nickel Other (See Comments)    Causes weepiness (skin)   Penicillins Rash    Has patient had a PCN reaction causing immediate rash, facial/tongue/throat swelling, SOB or lightheadedness with hypotension: Yes Has patient had a PCN reaction causing severe rash involving mucus membranes or skin necrosis: Unk Has patient had a PCN reaction that required hospitalization: No  Has patient had a PCN reaction occurring within the last 10 years: No If all of the above answers are "NO", then may proceed with Cephalosporin use.       ROS Negative unless stated above    Objective:     BP 120/84   Pulse (!) 58   Temp 98.4 F (36.9 C)   Ht 5\' 3"  (1.6 m)   SpO2 97%   BMI 26.04 kg/m  BP Readings from Last 3 Encounters:  03/09/23 120/84  12/30/22 (!) 118/52  06/02/21 (!) 120/97   Wt Readings from Last 3 Encounters:  12/30/22 147 lb (66.7 kg)  03/25/21 138 lb 12.8 oz (63 kg)  02/25/21 136 lb (61.7 kg)      Physical Exam Constitutional:  General: She is sleeping. She is not in acute distress.    Appearance: Normal appearance.     Comments: Patient easily aroused, cooperative.  HENT:     Head: Normocephalic and atraumatic.     Nose: Nose normal.     Mouth/Throat:     Mouth: Mucous membranes are moist.  Eyes:     Comments: Exophthalmos bilaterally R>L.  R eye with mild conjunctival injection and tearing.  Cardiovascular:     Rate and Rhythm: Normal rate and regular rhythm.     Heart sounds: Normal heart sounds. No murmur heard.    No gallop.  Pulmonary:     Effort: Pulmonary effort is normal. No respiratory distress.     Breath sounds:  Normal breath sounds. No wheezing, rhonchi or rales.  Skin:    General: Skin is warm and dry.  Neurological:     Mental Status: Mental status is at baseline.     Comments: Oriented to person.  Gait not assessed as sitting in transport wheelchair napping.  Psychiatric:        Behavior: Behavior is cooperative.      No results found for any visits on 03/09/23.    Assessment & Plan:  Severe late onset Alzheimer's dementia with other behavioral disturbance -     LORazepam; Take 1 tablet (0.5 mg total) by mouth at bedtime as needed for anxiety.  Dispense: 30 tablet; Refill: 2 -     Sertraline HCl; Take 1 tablet (25 mg total) by mouth daily.  Dispense: 90 tablet; Refill: 1  Acquired hypothyroidism -     Levothyroxine Sodium; Take 1 tablet (88 mcg total) by mouth every morning.  Dispense: 90 tablet; Refill: 3  Urinary incontinence, unspecified type -     PROCare Adult Briefs X-Large; Use as directed.  Dispense: 50 each; Refill: 11  Essential hypertension -     amLODIPine Besylate; Take 1 tablet (5 mg total) by mouth daily.  Dispense: 90 tablet; Refill: 3  Mixed hyperlipidemia -On Lipitor 40 mg daily.  Okay to hold/discontinue  Administrative encounter -MOST form completed.  Copy made to scanned into chart.  Patient/daughter has original.  Most form completed.  Pt does not want intubation, ventilation, feeding tube.  Ok with CPR, abx, IVFs.  Patient's daughter patient's [facility] POA and POA.  Discussed concerns with using lorazepam as can increase fall risks and cause somnolence.  Continue bowel regimen if using Norco for chronic arthritis pain.  Continue Norvasc 5 mg daily for blood pressure is well-controlled.   On day of service, 43 minutes spent caring for this patient face-to-face, reviewing the chart, counseling and/or coordinating care for plan and treatment of diagnosis below.     Return in about 2 months (around 05/09/2023), or if symptoms worsen or fail to improve.   Deeann Saint, MD

## 2023-03-16 DIAGNOSIS — F0283 Dementia in other diseases classified elsewhere, unspecified severity, with mood disturbance: Secondary | ICD-10-CM | POA: Diagnosis not present

## 2023-03-16 DIAGNOSIS — I2510 Atherosclerotic heart disease of native coronary artery without angina pectoris: Secondary | ICD-10-CM | POA: Diagnosis not present

## 2023-03-16 DIAGNOSIS — F02818 Dementia in other diseases classified elsewhere, unspecified severity, with other behavioral disturbance: Secondary | ICD-10-CM | POA: Diagnosis not present

## 2023-03-16 DIAGNOSIS — M160 Bilateral primary osteoarthritis of hip: Secondary | ICD-10-CM | POA: Diagnosis not present

## 2023-03-16 DIAGNOSIS — E039 Hypothyroidism, unspecified: Secondary | ICD-10-CM | POA: Diagnosis not present

## 2023-03-22 DIAGNOSIS — M160 Bilateral primary osteoarthritis of hip: Secondary | ICD-10-CM | POA: Diagnosis not present

## 2023-03-22 DIAGNOSIS — I2510 Atherosclerotic heart disease of native coronary artery without angina pectoris: Secondary | ICD-10-CM | POA: Diagnosis not present

## 2023-03-22 DIAGNOSIS — F02818 Dementia in other diseases classified elsewhere, unspecified severity, with other behavioral disturbance: Secondary | ICD-10-CM | POA: Diagnosis not present

## 2023-03-22 DIAGNOSIS — E039 Hypothyroidism, unspecified: Secondary | ICD-10-CM | POA: Diagnosis not present

## 2023-03-22 DIAGNOSIS — F0283 Dementia in other diseases classified elsewhere, unspecified severity, with mood disturbance: Secondary | ICD-10-CM | POA: Diagnosis not present

## 2023-03-25 DIAGNOSIS — M1990 Unspecified osteoarthritis, unspecified site: Secondary | ICD-10-CM | POA: Diagnosis not present

## 2023-04-04 ENCOUNTER — Telehealth: Payer: Self-pay | Admitting: Family Medicine

## 2023-04-04 DIAGNOSIS — I2510 Atherosclerotic heart disease of native coronary artery without angina pectoris: Secondary | ICD-10-CM | POA: Diagnosis not present

## 2023-04-04 DIAGNOSIS — E039 Hypothyroidism, unspecified: Secondary | ICD-10-CM | POA: Diagnosis not present

## 2023-04-04 DIAGNOSIS — F02818 Dementia in other diseases classified elsewhere, unspecified severity, with other behavioral disturbance: Secondary | ICD-10-CM | POA: Diagnosis not present

## 2023-04-04 DIAGNOSIS — F0283 Dementia in other diseases classified elsewhere, unspecified severity, with mood disturbance: Secondary | ICD-10-CM | POA: Diagnosis not present

## 2023-04-04 DIAGNOSIS — M160 Bilateral primary osteoarthritis of hip: Secondary | ICD-10-CM | POA: Diagnosis not present

## 2023-04-04 NOTE — Telephone Encounter (Signed)
Sabrina Hart from [facility] call and stated suspect pt have a bladder infection with increase color of her urine within the last 5 days with strong odor and need a order.Sabrina Hart need a call back at # [phone].

## 2023-04-06 ENCOUNTER — Other Ambulatory Visit (INDEPENDENT_AMBULATORY_CARE_PROVIDER_SITE_OTHER): Payer: Medicare Other

## 2023-04-06 ENCOUNTER — Encounter: Payer: Self-pay | Admitting: Family Medicine

## 2023-04-06 ENCOUNTER — Telehealth (INDEPENDENT_AMBULATORY_CARE_PROVIDER_SITE_OTHER): Payer: Medicare Other | Admitting: Family Medicine

## 2023-04-06 DIAGNOSIS — R8290 Unspecified abnormal findings in urine: Secondary | ICD-10-CM | POA: Diagnosis not present

## 2023-04-06 DIAGNOSIS — R3989 Other symptoms and signs involving the genitourinary system: Secondary | ICD-10-CM

## 2023-04-06 DIAGNOSIS — K5900 Constipation, unspecified: Secondary | ICD-10-CM

## 2023-04-06 LAB — POC URINALSYSI DIPSTICK (AUTOMATED)
Bilirubin, UA: NEGATIVE
Glucose, UA: NEGATIVE
Ketones, UA: NEGATIVE
Nitrite, UA: NEGATIVE
Protein, UA: NEGATIVE
Spec Grav, UA: 1.025 (ref 1.010–1.025)
Urobilinogen, UA: NEGATIVE E.U./dL — AB
pH, UA: 6 (ref 5.0–8.0)

## 2023-04-06 MED ORDER — CEFDINIR 300 MG PO CAPS: 300.0000 mg | ORAL_CAPSULE | Freq: Two times a day (BID) | ORAL | 0 refills | Status: AC

## 2023-04-06 MED ORDER — SENNOSIDES-DOCUSATE SODIUM 8.6-50 MG PO TABS: 1.0000 | ORAL_TABLET | Freq: Every day | ORAL | 11 refills | Status: DC

## 2023-04-06 NOTE — Addendum Note (Signed)
Addended by: Marian Sorrow D on: 04/06/2023 04:35 PM   Modules accepted: Orders

## 2023-04-06 NOTE — Addendum Note (Signed)
Addended by: Elwin Mocha on: 04/06/2023 04:15 PM   Modules accepted: Orders

## 2023-04-06 NOTE — Progress Notes (Signed)
Virtual Visit via Video Note  I connected with Sabrina Hart on 04/06/23 at  3:30 PM EDT by a video enabled telemedicine application and verified that I am speaking with the correct person using two identifiers.  Location patient: home Location provider:work or home office Persons participating in the virtual visit: patient, provider  I discussed the limitations of evaluation and management by telemedicine and the availability of in person appointments. The patient expressed understanding and agreed to proceed.  Chief Complaint  Patient presents with   Urinary Tract Infection    5-6 days, color change and strong smell, burning started yesterday. No fever, was sleeping all day yesterday, and cold    Constipation    Tried dulcolax, helped a little. This time it is going on 5 days. Has also tried ginger tea as well    HPI: Pt is a 89 yo female with pmh sig for Hypothyroidism, glaucoma, HLD, CAD s/p PCI  Pt with constipation.  Last BM 5 days ago.  Using stool softener dulcolax once a wk.   Urine malodorous x 1 wk.  Color has been tan.  Drinking 1 cup of coffee in am, water and herbal tea throughout the rest of the day.  Pt endorses mild dysuria.  Urine sample provided.  ROS: See pertinent positives and negatives per HPI.  Past Medical History:  Diagnosis Date   Anginal pain [facility])    1990's; 09/28/2012   Arthritis    "very mild" (09/28/2012)   Coronary artery disease 09/27/2012   s/p PCI of the RCA with DES with remote PCI in [place] 15 to 20 years ago. 2. cath 02/2013 patent mid RCA stent, 30% left main, 70% mid to distaal tanden 70% lesions x 3, 60% mid diag, 50% ostial LCx, 40% mid LCx.    Glaucoma    Hyperlipidemia    Hypothyroidism     Past Surgical History:  Procedure Laterality Date   CARDIAC CATHETERIZATION  1990s   PCI   CATARACT EXTRACTION W/ INTRAOCULAR LENS  IMPLANT, BILATERAL  1970's   CORONARY ANGIOPLASTY WITH STENT PLACEMENT  09/28/2012   20-30% diffuse LAD  stenosis, mild-mod D1 dz, 70% distal D1 lesion, mild diffuse LCx dz, 50-60% OM1 stenosis, 60% mid & 90% mid-dis RCA stenosis s/p DES; LVEF 75-80% w/ hyperdynamic fxn.    DENTAL SURGERY     "implants"   INNER EAR SURGERY  ~ 2010   "put plugs in so I could fly" (09/28/2012)   LEFT HEART CATHETERIZATION WITH CORONARY ANGIOGRAM N/A 09/28/2012   Procedure: LEFT HEART CATHETERIZATION WITH CORONARY ANGIOGRAM;  Surgeon: [name], MD;  Location: [facility] CATH LAB;  Service: Cardiovascular;  Laterality: N/A;   LUMBAR DISC SURGERY  1980's   "took out a little piece" (09/28/2012)   PERCUTANEOUS CORONARY STENT INTERVENTION (PCI-S)  09/28/2012   Procedure: PERCUTANEOUS CORONARY STENT INTERVENTION (PCI-S);  Surgeon: [name], MD;  Location: [facility] CATH LAB;  Service: Cardiovascular;;   REDUCTION MAMMAPLASTY  1970's    No family history on file.  Current Outpatient Medications:    amLODipine (NORVASC) 5 MG tablet, Take 1 tablet (5 mg total) by mouth daily., Disp: 90 tablet, Rfl: 3   atorvastatin (LIPITOR) 40 MG tablet, Take 1 tablet (40 mg total) by mouth daily., Disp: 30 tablet, Rfl: 4   HYDROcodone-acetaminophen (NORCO/VICODIN) 5-325 MG tablet, Take 1 tablet by mouth 2 (two) times daily as needed., Disp: , Rfl:    Incontinence Supply Disposable (PROCARE ADULT BRIEFS X-LARGE) MISC, Use as directed.,  Disp: 50 each, Rfl: 11   latanoprost (XALATAN) 0.005 % ophthalmic solution, Place 1 drop into both eyes at bedtime., Disp: , Rfl:    levothyroxine (SYNTHROID) 88 MCG tablet, Take 1 tablet (88 mcg total) by mouth every morning., Disp: 90 tablet, Rfl: 3   LORazepam (ATIVAN) 0.5 MG tablet, Take 1 tablet (0.5 mg total) by mouth at bedtime as needed for anxiety., Disp: 30 tablet, Rfl: 2   melatonin 3 MG TABS tablet, Take 3 mg by mouth at bedtime., Disp: , Rfl:    risperiDONE (RISPERDAL) 0.5 MG tablet, Take 0.5 mg by mouth 2 (two) times daily., Disp: , Rfl:    sertraline (ZOLOFT) 25 MG tablet, Take 1 tablet (25  mg total) by mouth daily., Disp: 90 tablet, Rfl: 1   SIMBRINZA 1-0.2 % SUSP, Apply to eye., Disp: , Rfl:    AMBULATORY NON FORMULARY MEDICATION, Take 300 mg by mouth as directed. Medication Name: Inclirisan sodium 300mg  vs placebo (orion-10 Study per protocol (Patient not taking: Reported on 02/25/2021), Disp: , Rfl:    donepezil (ARICEPT) 5 MG tablet, Take 1 tablet (5 mg total) by mouth at bedtime. (Patient not taking: Reported on 02/25/2021), Disp: 30 tablet, Rfl: 3   nitroGLYCERIN (NITROSTAT) 0.4 MG SL tablet, PLACE 1 TABLET UNDER THE TONGUE EVERY 5 MINUTES AS NEEDED FOR CHEST PAIN. (Patient not taking: Reported on 02/25/2021), Disp: 25 tablet, Rfl: 3  EXAM:  VITALS per patient if applicable:  RR between 12-20 bpm  GENERAL: alert, oriented, appears well and in no acute distress  HEENT: atraumatic, conjunctiva clear, R eye proptosis, edema of R side of face.  no obvious abnormalities on inspection of external nose and ears  NECK: normal movements of the head and neck  LUNGS: on inspection no signs of respiratory distress, breathing rate appears normal, no obvious gross SOB, gasping or wheezing  CV: no obvious cyanosis  MS: moves all visible extremities without noticeable abnormality  PSYCH/NEURO: pleasant and cooperative, no obvious depression or anxiety, speech and thought processing grossly intact  ASSESSMENT AND PLAN:  Discussed the following assessment and plan:  Suspected UTI - Plan: cefdinir (OMNICEF) 300 MG capsule  Malodorous urine - Plan: cefdinir (OMNICEF) 300 MG capsule  Constipation, unspecified constipation type - Plan: senna-docusate (SENOKOT-S) 8.6-50 MG tablet  Start abx cefdinir for suspected UTI.  Obtain UCx.  Daily bowel regimen for constipation.  Rx for senna-docusate sent to pharmacy.   Can also use Miralax daily as needed.    Given strict  precautions.  F/u for continued or worsened symtpoms.   I discussed the assessment and treatment plan with the patient.  The patient was provided an opportunity to ask questions and all were answered. The patient agreed with the plan and demonstrated an understanding of the instructions.   The patient was advised to call back or seek an in-person evaluation if the symptoms worsen or if the condition fails to improve as anticipated.   Deeann Saint, MD

## 2023-04-09 LAB — URINE CULTURE
MICRO NUMBER:: 14930173
SPECIMEN QUALITY:: ADEQUATE

## 2023-04-11 DIAGNOSIS — M160 Bilateral primary osteoarthritis of hip: Secondary | ICD-10-CM | POA: Diagnosis not present

## 2023-04-11 DIAGNOSIS — I2510 Atherosclerotic heart disease of native coronary artery without angina pectoris: Secondary | ICD-10-CM | POA: Diagnosis not present

## 2023-04-11 DIAGNOSIS — E039 Hypothyroidism, unspecified: Secondary | ICD-10-CM | POA: Diagnosis not present

## 2023-04-11 DIAGNOSIS — F02818 Dementia in other diseases classified elsewhere, unspecified severity, with other behavioral disturbance: Secondary | ICD-10-CM | POA: Diagnosis not present

## 2023-04-11 DIAGNOSIS — F0283 Dementia in other diseases classified elsewhere, unspecified severity, with mood disturbance: Secondary | ICD-10-CM | POA: Diagnosis not present

## 2023-04-13 ENCOUNTER — Telehealth: Payer: Self-pay | Admitting: Family Medicine

## 2023-04-13 NOTE — Telephone Encounter (Signed)
Pt's daughter Philipp Deputy) called to ask if Pt can take the sertraline and the risperiDONE together?  sertraline (ZOLOFT) 25 MG tablet and the risperiDONE (RISPERDAL) 0.5 MG tablet   Please advise.    [phone]

## 2023-04-14 NOTE — Telephone Encounter (Signed)
Pt was started on the med while in nursing facility.  Risperidone is sometimes used off label for short-term use however can increase risk of death in elderly patients.

## 2023-04-15 IMAGING — DX DG CHEST 2V
2 series · 2 of 2 positions shown · non-contrast
Comparison: 10/03/2018

CLINICAL DATA: Chest pain

EXAM:
CHEST - 2 VIEW

[w chest pa]
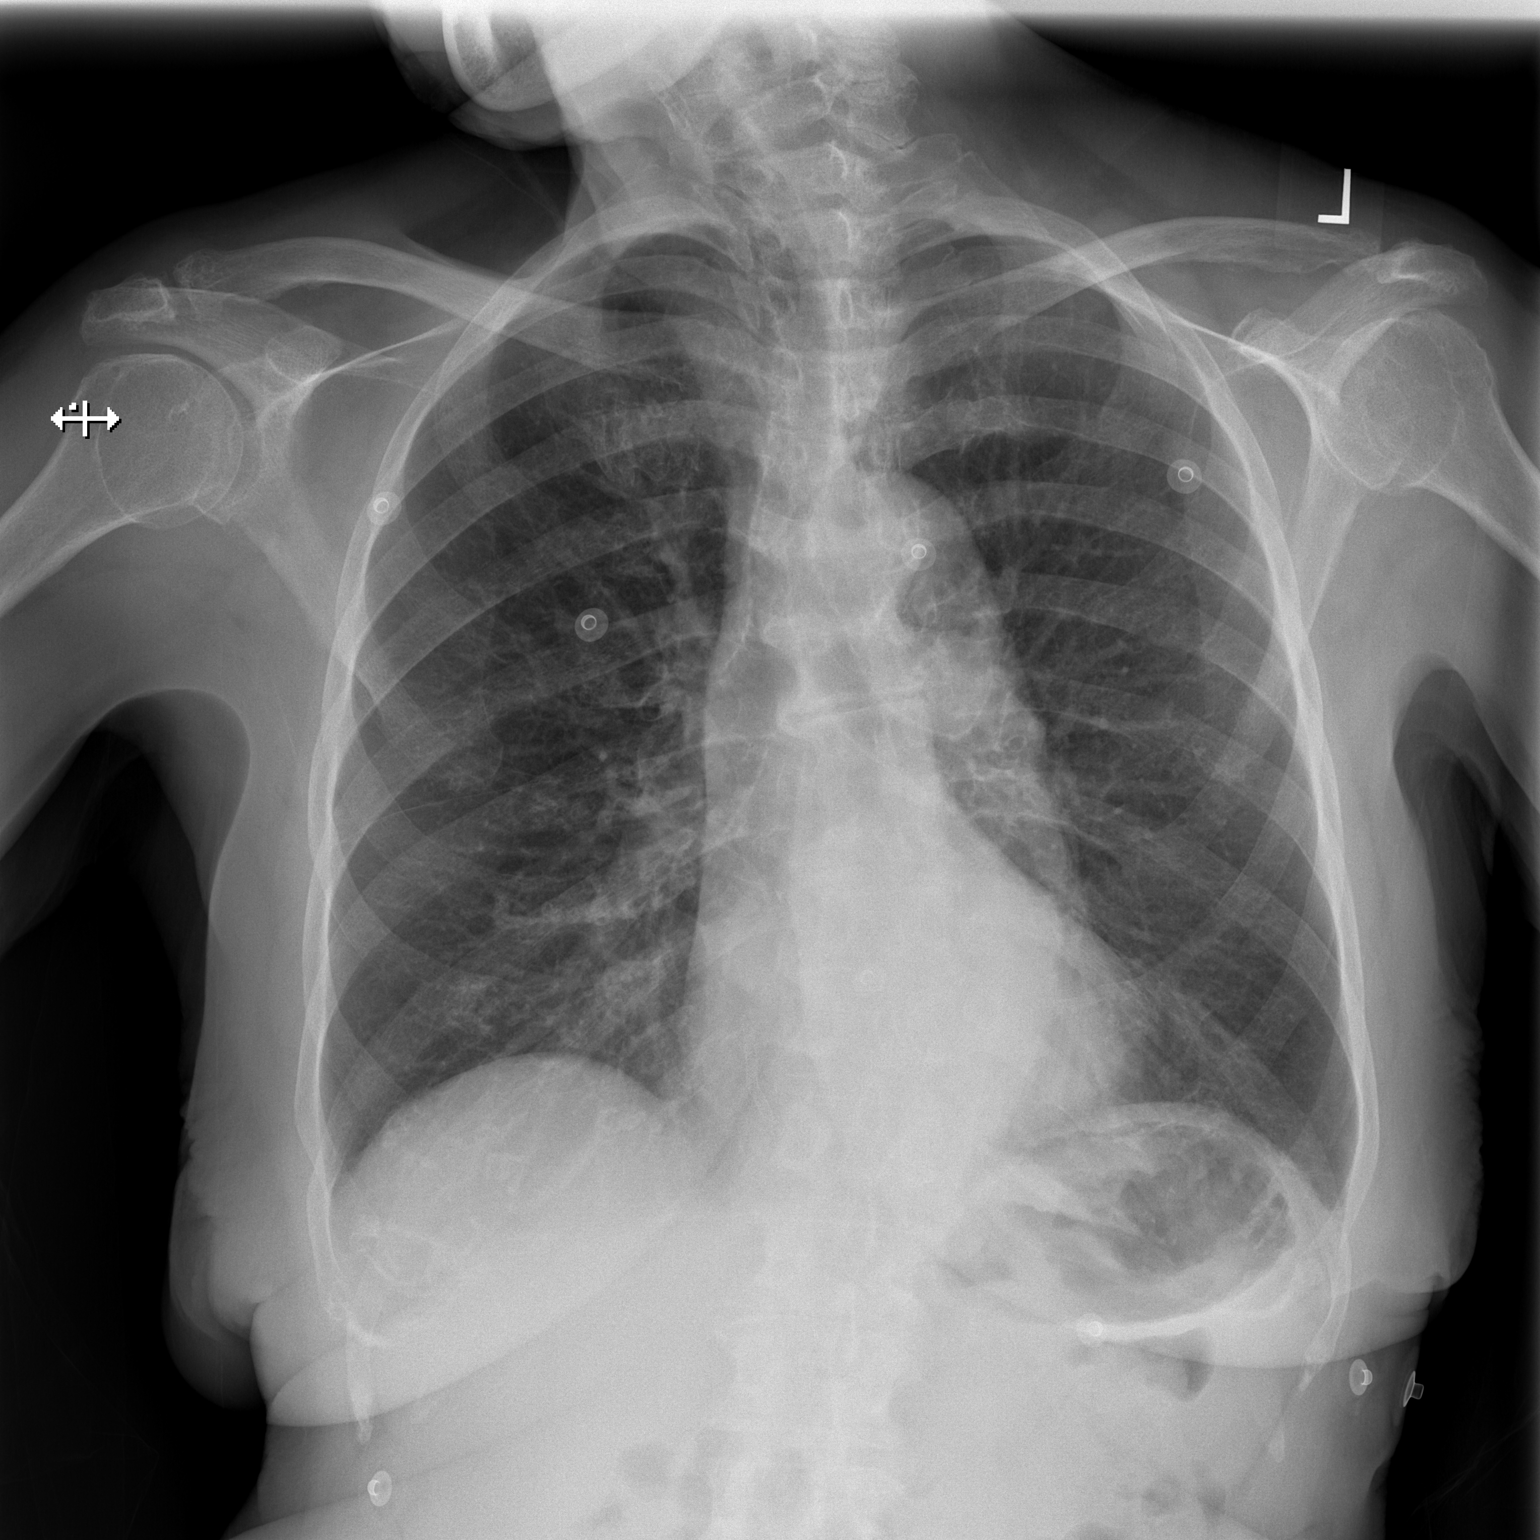

[w chest lat]
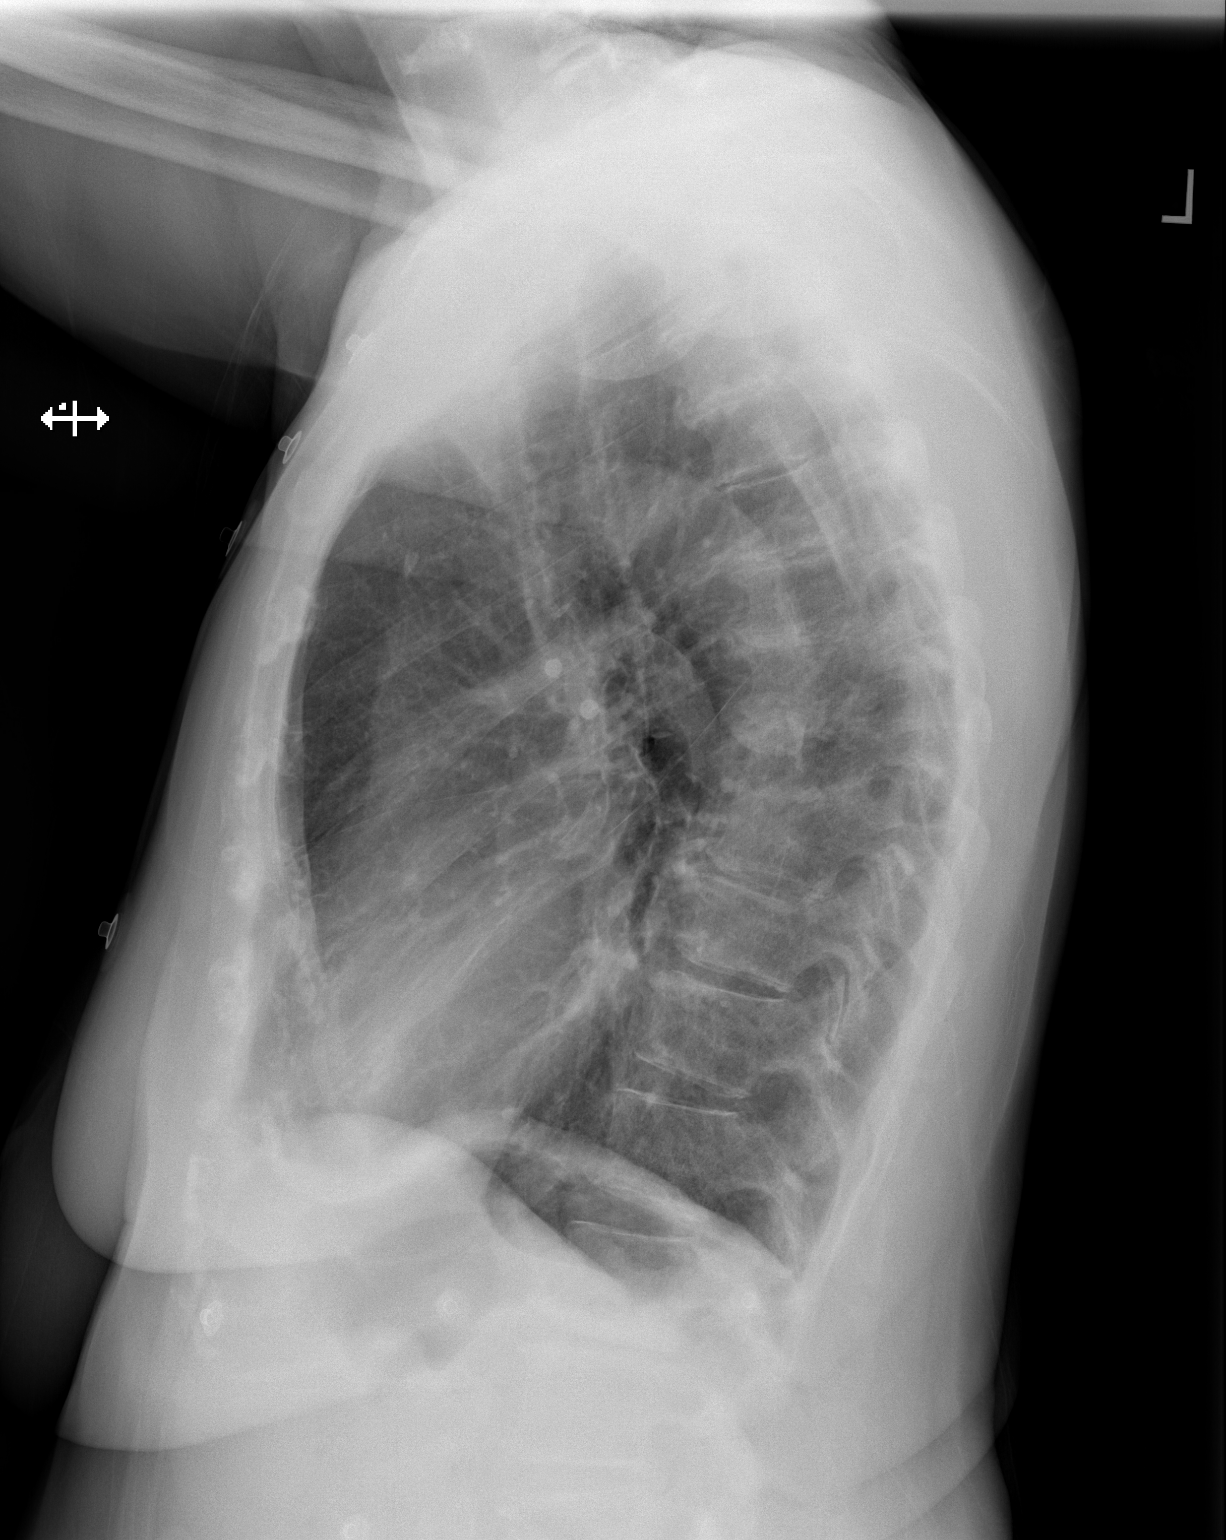

[2 of 2 positions shown; findings below may reference images not displayed]

FINDINGS: The heart size and mediastinal contours are within normal limits.
Both lungs are clear. The visualized skeletal structures are
unremarkable.
IMPRESSION: No active cardiopulmonary disease.

## 2023-04-22 NOTE — Telephone Encounter (Signed)
Can give pt the risperidone in the early afternoon/afternoon instead of the lorazepam.  The lorazepam is for as needed use.

## 2023-04-24 DIAGNOSIS — M1990 Unspecified osteoarthritis, unspecified site: Secondary | ICD-10-CM | POA: Diagnosis not present

## 2023-05-25 DIAGNOSIS — M1990 Unspecified osteoarthritis, unspecified site: Secondary | ICD-10-CM | POA: Diagnosis not present

## 2023-06-24 DIAGNOSIS — M1990 Unspecified osteoarthritis, unspecified site: Secondary | ICD-10-CM | POA: Diagnosis not present

## 2023-06-26 ENCOUNTER — Other Ambulatory Visit: Payer: Self-pay | Admitting: Family Medicine

## 2023-06-26 DIAGNOSIS — F02C18 Dementia in other diseases classified elsewhere, severe, with other behavioral disturbance: Secondary | ICD-10-CM

## 2023-08-12 ENCOUNTER — Telehealth: Payer: Medicare Other | Admitting: Family Medicine

## 2023-08-12 NOTE — Telephone Encounter (Signed)
Requesting Medicare assistance form for assignment of home assistance be filled out. Was told by medicare that provider has the form

## 2023-08-18 NOTE — Telephone Encounter (Signed)
Pt daughter is aware md out of office this week

## 2023-08-26 NOTE — Telephone Encounter (Signed)
Daughter calling to see when form will be sent to medicare and that she needs to get a copy. Can be sent via email daughter's email [email or web address]

## 2023-08-28 ENCOUNTER — Encounter: Payer: Self-pay | Admitting: Family Medicine

## 2023-08-28 DIAGNOSIS — H401130 Primary open-angle glaucoma, bilateral, stage unspecified: Secondary | ICD-10-CM | POA: Insufficient documentation

## 2023-08-28 DIAGNOSIS — M160 Bilateral primary osteoarthritis of hip: Secondary | ICD-10-CM | POA: Insufficient documentation

## 2023-08-28 DIAGNOSIS — I10 Essential (primary) hypertension: Secondary | ICD-10-CM | POA: Insufficient documentation

## 2023-08-29 NOTE — Telephone Encounter (Signed)
Spoke with daughter, she is going to get the paperwork and fax it to [place]

## 2023-10-16 ENCOUNTER — Other Ambulatory Visit: Payer: Self-pay | Admitting: Family Medicine

## 2023-10-16 DIAGNOSIS — F02C18 Dementia in other diseases classified elsewhere, severe, with other behavioral disturbance: Secondary | ICD-10-CM

## 2023-10-31 ENCOUNTER — Encounter: Payer: Self-pay | Admitting: Family Medicine

## 2023-10-31 ENCOUNTER — Ambulatory Visit (INDEPENDENT_AMBULATORY_CARE_PROVIDER_SITE_OTHER): Payer: Medicare Other | Admitting: Family Medicine

## 2023-10-31 VITALS — BP 120/72 | HR 73 | Temp 97.5°F

## 2023-10-31 DIAGNOSIS — I10 Essential (primary) hypertension: Secondary | ICD-10-CM | POA: Diagnosis not present

## 2023-10-31 DIAGNOSIS — D229 Melanocytic nevi, unspecified: Secondary | ICD-10-CM

## 2023-10-31 DIAGNOSIS — K5900 Constipation, unspecified: Secondary | ICD-10-CM | POA: Diagnosis not present

## 2023-10-31 DIAGNOSIS — E039 Hypothyroidism, unspecified: Secondary | ICD-10-CM

## 2023-10-31 DIAGNOSIS — R1031 Right lower quadrant pain: Secondary | ICD-10-CM | POA: Diagnosis not present

## 2023-10-31 DIAGNOSIS — G301 Alzheimer's disease with late onset: Secondary | ICD-10-CM

## 2023-10-31 DIAGNOSIS — F02C18 Dementia in other diseases classified elsewhere, severe, with other behavioral disturbance: Secondary | ICD-10-CM

## 2023-10-31 NOTE — Progress Notes (Signed)
Established Patient Office Visit   Subjective  Patient ID: Sabrina Hart, female    DOB: 1932/05/30  Age: 89 y.o. MRN: [id number]  Chief Complaint  Patient presents with   Medical Management of Chronic Issues    Spot of above the ear, FL@ paper work for daughter     Pt is an 89 yo female seen for f/u on chronic conditions, hpi limited due to h/o dementia.  Per pt's daughter she has been having issues with constipation.  Tried various OTC meds with little relief in symptoms or explosive stools.  Currently taking a Magnesium supplement every few days which is helping.  Pt mentioned RLQ abd pain last wk, but denies it now.  Need new FL2 forms completed.  Pt living at home with her daughter.  In the past was at a SNF.  Pt has a spot on R temple present x a few months, but seems to be getting larger and does not heal.  An iron solution on the area helped some.  BP well controlled on Norvasc 5 mg.  Taking synthroid 88 mcg daily.  Was to return for repeat TSH as last levels extremely elevated at 36.230.     Patient Active Problem List   Diagnosis Date Noted   Bilateral primary osteoarthritis of hip 08/28/2023   Essential hypertension 08/28/2023   Primary open angle glaucoma (POAG) of both eyes 08/28/2023   Severe late onset Alzheimer's dementia (HCC) 04/12/2019   Lower extremity edema 04/26/2017   Unstable angina (HCC) 03/02/2013   Hypothyroidism 09/29/2012   Hyperlipidemia 09/29/2012   Hypokalemia 09/29/2012   CAD (coronary artery disease) 09/27/2012   Past Medical History:  Diagnosis Date   Anginal pain (HCC)    1990's; 09/28/2012   Arthritis    "very mild" (09/28/2012)   Coronary artery disease 09/27/2012   s/p PCI of the RCA with DES with remote PCI in [place] 15 to 20 years ago. 2. cath 02/2013 patent mid RCA stent, 30% left main, 70% mid to distaal tanden 70% lesions x 3, 60% mid diag, 50% ostial LCx, 40% mid LCx.    Glaucoma    Hyperlipidemia    Hypothyroidism     Past Surgical History:  Procedure Laterality Date   CARDIAC CATHETERIZATION  1990s   PCI   CATARACT EXTRACTION W/ INTRAOCULAR LENS  IMPLANT, BILATERAL  1970's   CORONARY ANGIOPLASTY WITH STENT PLACEMENT  09/28/2012   20-30% diffuse LAD stenosis, mild-mod D1 dz, 70% distal D1 lesion, mild diffuse LCx dz, 50-60% OM1 stenosis, 60% mid & 90% mid-dis RCA stenosis s/p DES; LVEF 75-80% w/ hyperdynamic fxn.    DENTAL SURGERY     "implants"   INNER EAR SURGERY  ~ 2010   "put plugs in so I could fly" (09/28/2012)   LEFT HEART CATHETERIZATION WITH CORONARY ANGIOGRAM N/A 09/28/2012   Procedure: LEFT HEART CATHETERIZATION WITH CORONARY ANGIOGRAM;  Surgeon: [name], MD;  Location: [facility] CATH LAB;  Service: Cardiovascular;  Laterality: N/A;   LUMBAR DISC SURGERY  1980's   "took out a little piece" (09/28/2012)   PERCUTANEOUS CORONARY STENT INTERVENTION (PCI-S)  09/28/2012   Procedure: PERCUTANEOUS CORONARY STENT INTERVENTION (PCI-S);  Surgeon: [name], MD;  Location: [facility] CATH LAB;  Service: Cardiovascular;;   REDUCTION MAMMAPLASTY  1970's   Social History   Tobacco Use   Smoking status: Never   Smokeless tobacco: Never  Vaping Use   Vaping status: Never Used  Substance Use Topics   Alcohol use:  Yes    Alcohol/week: 1.0 standard drink of alcohol    Types: 1 Glasses of wine per week    Comment: 09/28/2012 "1 glass of wine averages once/wk; beer average once/week during summer w/friends"   Drug use: No   History reviewed. No pertinent family history. Allergies  Allergen Reactions   Other Other (See Comments)    Metals; "skin gets weepy; gets worse if it makes contact" (09/28/2012)   Pollen Extract Other (See Comments)    "sneezing; watery eyes"   Latex Itching and Swelling    Skin swells   Morphine And Codeine Rash   Nickel Other (See Comments)    Causes weepiness (skin)   Penicillins Rash    Has patient had a PCN reaction causing immediate rash, facial/tongue/throat  swelling, SOB or lightheadedness with hypotension: Yes Has patient had a PCN reaction causing severe rash involving mucus membranes or skin necrosis: Unk Has patient had a PCN reaction that required hospitalization: No  Has patient had a PCN reaction occurring within the last 10 years: No If all of the above answers are "NO", then may proceed with Cephalosporin use.       ROS Negative unless stated above    Objective:     BP 120/72 (BP Location: Left Arm, Patient Position: Sitting, Cuff Size: Normal)   Pulse 73   Temp (!) 97.5 F (36.4 C) (Oral)   SpO2 97%  BP Readings from Last 3 Encounters:  10/31/23 120/72  03/09/23 120/84  12/30/22 (!) 118/52   Wt Readings from Last 3 Encounters:  12/30/22 147 lb (66.7 kg)  03/25/21 138 lb 12.8 oz (63 kg)  02/25/21 136 lb (61.7 kg)      Physical Exam Constitutional:      General: She is sleeping. She is not in acute distress.    Appearance: Normal appearance.  HENT:     Head: Normocephalic and atraumatic.     Nose: Nose normal.     Mouth/Throat:     Mouth: Mucous membranes are moist.  Cardiovascular:     Rate and Rhythm: Normal rate and regular rhythm.     Heart sounds: Normal heart sounds. No murmur heard.    No gallop.  Pulmonary:     Effort: Pulmonary effort is normal. No respiratory distress.     Breath sounds: Normal breath sounds. No wheezing, rhonchi or rales.  Abdominal:     General: Bowel sounds are increased.     Tenderness: There is no abdominal tenderness.  Skin:    General: Skin is warm and dry.     Findings: Lesion present.     Comments: Nonhealing, flat lesion R temple with areas of hyperpigmentation and hypopigmentation centrally.  Neurological:     Mental Status: She is easily aroused. Mental status is at baseline. She is confused.     Comments: Gait not assessed  Psychiatric:        Behavior: Behavior is cooperative.    No results found for any visits on 10/31/23.    Assessment & Plan:  Atypical  nevus -New lesion on right temple that will not heal. -Discussed referral to dermatology for removal. -     Ambulatory referral to Dermatology  Acquired hypothyroidism -Previously not well-controlled.  Likely 2/2 timing of medication.  Discussed the importance of taking Synthroid first thing in the morning at least 30 minutes prior to taking other medications or eating -Last TSH 36.23 on 12/30/2022.  Previously advised to have levels rechecked. -Continue Synthroid 88 mcg -  TSH  Right lower quadrant abdominal pain -     Comprehensive metabolic panel -     CBC with Differential/Platelet  Constipation, unspecified constipation type -Continue magnesium supplement as needed. -Concern uncontrolled hypothyroidism likely contributing -     TSH -     Comprehensive metabolic panel  Essential hypertension -Controlled -Continue Norvasc 5 mg daily -     TSH -     Comprehensive metabolic panel  Severe late onset Alzheimer's dementia with other behavioral disturbance (HCC) -Stable -Previously tried Aricept.  Not currently on medication. -Continue Zoloft 25 mg daily and risperidone  Return if symptoms worsen or fail to improve.   Deeann Saint, MD

## 2023-11-14 ENCOUNTER — Telehealth: Payer: Self-pay | Admitting: Family Medicine

## 2023-11-14 NOTE — Telephone Encounter (Signed)
Pt called to F/U on FL2 forms. Please call Pt back to discuss.

## 2023-11-14 NOTE — Telephone Encounter (Signed)
Called patient left a v/m to return call.

## 2023-12-07 ENCOUNTER — Other Ambulatory Visit: Payer: Self-pay | Admitting: Family Medicine

## 2023-12-07 DIAGNOSIS — C44319 Basal cell carcinoma of skin of other parts of face: Secondary | ICD-10-CM | POA: Diagnosis not present

## 2023-12-07 DIAGNOSIS — D485 Neoplasm of uncertain behavior of skin: Secondary | ICD-10-CM | POA: Diagnosis not present

## 2023-12-07 DIAGNOSIS — G301 Alzheimer's disease with late onset: Secondary | ICD-10-CM

## 2023-12-20 ENCOUNTER — Other Ambulatory Visit: Payer: Self-pay | Admitting: Family Medicine

## 2023-12-20 DIAGNOSIS — G301 Alzheimer's disease with late onset: Secondary | ICD-10-CM

## 2023-12-20 DIAGNOSIS — E782 Mixed hyperlipidemia: Secondary | ICD-10-CM

## 2023-12-20 NOTE — Telephone Encounter (Signed)
Copied from CRM [id number]. Topic: Clinical - Medication Refill >> Dec 20, 2023 10:54 AM Benetta Spar A wrote: Most Recent Primary Care Visit:  Provider: Deeann Saint  Department: [facility]  Visit Type: OFFICE VISIT  Date: 10/31/2023  Medication: atorvastatin (LIPITOR) 40 MG tablet;   Has the patient contacted their pharmacy?  (Agent: If no, request that the patient contact the pharmacy for the refill. If patient does not wish to contact the pharmacy document the reason why and proceed with request.) (Agent: If yes, when and what did the pharmacy advise?)  Is this the correct pharmacy for this prescription?  If no, delete pharmacy and type the correct one.  This is the patient's preferred pharmacy:  [id number] [place], [place] - [address] [place] [address] Phone: [phone] Fax: [phone]   Has the prescription been filled recently?   Is the patient out of the medication?   Has the patient been seen for an appointment in the last year OR does the patient have an upcoming appointment?   Can we respond through MyChart?   Agent: Please be advised that Rx refills may take up to 3 business days. We ask that you follow-up with your pharmacy.

## 2023-12-26 ENCOUNTER — Telehealth: Payer: Self-pay | Admitting: Family Medicine

## 2023-12-26 DIAGNOSIS — G301 Alzheimer's disease with late onset: Secondary | ICD-10-CM

## 2023-12-26 NOTE — Telephone Encounter (Signed)
Copied from CRM [id number]. Topic: Referral - Request for Referral >> Dec 26, 2023 10:49 AM Samuel Jester B wrote: Did the patient discuss referral with their provider in the last year? Yes (If No - schedule appointment) (If Yes - send message)  Appointment offered? No  Type of order/referral and detailed reason for visit: Psychiatrist, daughter feel like she needs to be re-evaluated due to the pt declining.   Preference of office, provider, location: [facility]   If referral order, have you been seen by this specialty before? Yes (If Yes, this issue or another issue? When? Where? She was admitted to [facility] in 2019 and they kept her for 24-48 hours. Pt was placed in 2022 in assisted living and had a Psychiatrist assigned to her.   Can we respond through MyChart? No  The fax number the referral would need to be sent to is [phone]

## 2023-12-26 NOTE — Telephone Encounter (Signed)
Ok for referral to psychiatry..  Pt still needs to have TSH drawn.  Uncontrolled hypothyroidism may also be contributing to patient's symptoms.

## 2023-12-29 NOTE — Telephone Encounter (Signed)
Called patient left a VM to return call, referral has been placed

## 2024-01-02 ENCOUNTER — Other Ambulatory Visit: Payer: Medicare Other

## 2024-01-02 LAB — COMPREHENSIVE METABOLIC PANEL
ALT: 9 U/L (ref 0–35)
AST: 16 U/L (ref 0–37)
Albumin: 3.4 g/dL — ABNORMAL LOW (ref 3.5–5.2)
Alkaline Phosphatase: 87 U/L (ref 39–117)
BUN: 12 mg/dL (ref 6–23)
CO2: 27 meq/L (ref 19–32)
Calcium: 8.9 mg/dL (ref 8.4–10.5)
Chloride: 99 meq/L (ref 96–112)
Creatinine, Ser: 0.66 mg/dL (ref 0.40–1.20)
GFR: 77.68 mL/min (ref >60.00)
Glucose, Bld: 91 mg/dL (ref 70–99)
Potassium: 3.8 meq/L (ref 3.5–5.1)
Sodium: 135 meq/L (ref 135–145)
Total Bilirubin: 0.4 mg/dL (ref 0.2–1.2)
Total Protein: 6.7 g/dL (ref 6.0–8.3)

## 2024-01-02 LAB — CBC WITH DIFFERENTIAL/PLATELET
Basophils Absolute: 0 10*3/uL (ref 0.0–0.1)
Basophils Relative: 0.4 % (ref 0.0–3.0)
Eosinophils Absolute: 0.1 10*3/uL (ref 0.0–0.7)
Eosinophils Relative: 3 % (ref 0.0–5.0)
HCT: 38.6 % (ref 36.0–46.0)
Hemoglobin: 12.8 g/dL (ref 12.0–15.0)
Lymphocytes Relative: 25.5 % (ref 12.0–46.0)
Lymphs Abs: 1.1 10*3/uL (ref 0.7–4.0)
MCHC: 33.3 g/dL (ref 30.0–36.0)
MCV: 88.9 fL (ref 78.0–100.0)
Monocytes Absolute: 0.5 10*3/uL (ref 0.1–1.0)
Monocytes Relative: 12.5 % — ABNORMAL HIGH (ref 3.0–12.0)
Neutro Abs: 2.5 10*3/uL (ref 1.4–7.7)
Neutrophils Relative %: 58.6 % (ref 43.0–77.0)
Platelets: 342 10*3/uL (ref 150.0–400.0)
RBC: 4.34 Mil/uL (ref 3.87–5.11)
RDW: 14.3 % (ref 11.5–15.5)
WBC: 4.2 10*3/uL (ref 4.0–10.5)

## 2024-01-02 LAB — TSH: TSH: 1.81 u[IU]/mL (ref 0.35–5.50)

## 2024-01-03 ENCOUNTER — Encounter: Payer: Self-pay | Admitting: Family Medicine

## 2024-01-09 ENCOUNTER — Telehealth: Payer: Self-pay | Admitting: Family Medicine

## 2024-01-09 NOTE — Telephone Encounter (Signed)
 Copied from CRM [id number]. Topic: General - Call Back - No Documentation >> Jan 09, 2024 12:22 PM Lonzell Robin C wrote: Reason for CRM: patient missed a call from Dr. And was returning call. Patient does not know what it is pertaining to

## 2024-01-09 NOTE — Telephone Encounter (Signed)
 Pt is aware leah will return the call

## 2024-01-09 NOTE — Telephone Encounter (Signed)
 Called patient back to go over Labs, left a VM to return call

## 2024-01-10 ENCOUNTER — Telehealth: Payer: Self-pay

## 2024-01-10 NOTE — Telephone Encounter (Signed)
Copied from CRM [id number]. Topic: Clinical - Lab/Test Results >> Jan 10, 2024  2:49 PM Florestine Avers wrote: Reason for CRM: Patient called in to go over lab results. Patient is requesting a call back when the nurse gets a moment so they can go over he abnormal labs.

## 2024-01-11 ENCOUNTER — Other Ambulatory Visit: Payer: Self-pay

## 2024-01-11 DIAGNOSIS — E039 Hypothyroidism, unspecified: Secondary | ICD-10-CM

## 2024-01-11 DIAGNOSIS — E782 Mixed hyperlipidemia: Secondary | ICD-10-CM

## 2024-01-11 DIAGNOSIS — I10 Essential (primary) hypertension: Secondary | ICD-10-CM

## 2024-01-11 DIAGNOSIS — G301 Alzheimer's disease with late onset: Secondary | ICD-10-CM

## 2024-01-11 MED ORDER — AMLODIPINE BESYLATE 5 MG PO TABS: 5.0000 mg | ORAL_TABLET | Freq: Every day | ORAL | 3 refills | Status: DC

## 2024-01-11 MED ORDER — SERTRALINE HCL 25 MG PO TABS: 25.0000 mg | ORAL_TABLET | Freq: Every day | ORAL | 1 refills | Status: DC

## 2024-01-11 MED ORDER — LEVOTHYROXINE SODIUM 88 MCG PO TABS: 88.0000 ug | ORAL_TABLET | Freq: Every morning | ORAL | 3 refills | Status: DC

## 2024-01-11 NOTE — Telephone Encounter (Signed)
Spoke with Daughter per DPR about lab work results

## 2024-01-23 DIAGNOSIS — C44319 Basal cell carcinoma of skin of other parts of face: Secondary | ICD-10-CM | POA: Diagnosis not present

## 2024-02-03 ENCOUNTER — Telehealth: Payer: Self-pay

## 2024-02-03 ENCOUNTER — Other Ambulatory Visit: Payer: Self-pay | Admitting: Family Medicine

## 2024-02-03 ENCOUNTER — Telehealth: Payer: Self-pay | Admitting: Family Medicine

## 2024-02-03 DIAGNOSIS — G301 Alzheimer's disease with late onset: Secondary | ICD-10-CM

## 2024-02-03 MED ORDER — RISPERIDONE 0.5 MG PO TABS: 0.5000 mg | ORAL_TABLET | Freq: Two times a day (BID) | ORAL | 0 refills | Status: DC

## 2024-02-03 NOTE — Telephone Encounter (Signed)
 Copied from CRM [id number]. Topic: Referral - Request for Referral >> Feb 03, 2024 12:26 PM Eunice Blase wrote: Did the patient discuss referral with their provider in the last year? Yes (If No - schedule appointment) (If Yes - send message)  Appointment offered? Yes  Type of order/referral and detailed reason for visit: [facility]  Pt will be admitted on 03/13/2024.  Preference of office, provider, location: [address], [place], [place] Ph: [phone] , Fax: [phone]  If referral order, have you been seen by this specialty before? No (If Yes, this issue or another issue? When? Where?  Can we respond through MyChart? No

## 2024-02-03 NOTE — Telephone Encounter (Signed)
 Med refilled.

## 2024-02-03 NOTE — Telephone Encounter (Signed)
 Copied from CRM [id number]. Topic: Clinical - Medical Advice >> Feb 03, 2024 10:46 AM Theodis Sato wrote: Reason for CRM: Patients daughter [name] is requesting Dr. Salomon Fick to fill the patients risperiDONE (RISPERDAL) 0.5 MG tablet as the place Dr. Salomon Fick sent a psychiatric referral to : [facility] is backed up in trying to confirm the patients insurance and cannot schedule her yet nor can they confirm when they will be able to get her in. Patient only has 2 days left of this medication and the daughter states she is unmanageable without it. If Dr. Salomon Fick can fill this until the patient is able to be seen at [facility], that will be very helpful. Please call [phone] to confirm this can be done.

## 2024-02-03 NOTE — Telephone Encounter (Signed)
 Copied from CRM [id number]. Topic: Clinical - Medication Refill >> Feb 03, 2024 12:23 PM Eunice Blase wrote: Most Recent Primary Care Visit:  Provider: [facility]  Department: [facility]  Visit Type: LAB VISIT  Date: 01/02/2024  Medication: risperiDONE (RISPERDAL) 0.5 MG tablet  Has the patient contacted their pharmacy? Yes (Agent: If no, request that the patient contact the pharmacy for the refill. If patient does not wish to contact the pharmacy document the reason why and proceed with request.) (Agent: If yes, when and what did the pharmacy advise?)Needs PCP approval  Is this the correct pharmacy for this prescription? Yes If no, delete pharmacy and type the correct one.  This is the patient's preferred pharmacy:  [id number] [place], [place] - [address] [place] [address] Phone: [phone] Fax: [phone]   Has the prescription been filled recently? Yes  Is the patient out of the medication? Yes  Has the patient been seen for an appointment in the last year OR does the patient have an upcoming appointment? Yes  Can we respond through MyChart? Yes  Agent: Please be advised that Rx refills may take up to 3 business days. We ask that you follow-up with your pharmacy.

## 2024-03-13 DIAGNOSIS — Z5181 Encounter for therapeutic drug level monitoring: Secondary | ICD-10-CM | POA: Diagnosis not present

## 2024-04-29 ENCOUNTER — Other Ambulatory Visit: Payer: Self-pay | Admitting: Family Medicine

## 2024-04-29 DIAGNOSIS — F02C18 Dementia in other diseases classified elsewhere, severe, with other behavioral disturbance: Secondary | ICD-10-CM

## 2024-04-30 ENCOUNTER — Other Ambulatory Visit: Payer: Self-pay

## 2024-04-30 ENCOUNTER — Encounter (HOSPITAL_COMMUNITY): Payer: Self-pay

## 2024-04-30 ENCOUNTER — Emergency Department (HOSPITAL_COMMUNITY)

## 2024-04-30 ENCOUNTER — Emergency Department (HOSPITAL_COMMUNITY)
Admission: EM | Admit: 2024-04-30 | Discharge: 2024-05-01 | Disposition: A | Discharge status: Home / Self Care | Attending: Emergency Medicine | Admitting: Emergency Medicine

## 2024-04-30 DIAGNOSIS — F0390 Unspecified dementia without behavioral disturbance: Secondary | ICD-10-CM | POA: Insufficient documentation

## 2024-04-30 DIAGNOSIS — E039 Hypothyroidism, unspecified: Secondary | ICD-10-CM | POA: Diagnosis not present

## 2024-04-30 DIAGNOSIS — Z743 Need for continuous supervision: Secondary | ICD-10-CM | POA: Diagnosis not present

## 2024-04-30 DIAGNOSIS — W1811XA Fall from or off toilet without subsequent striking against object, initial encounter: Secondary | ICD-10-CM | POA: Insufficient documentation

## 2024-04-30 DIAGNOSIS — Z91040 Latex allergy status: Secondary | ICD-10-CM | POA: Diagnosis not present

## 2024-04-30 DIAGNOSIS — M19012 Primary osteoarthritis, left shoulder: Secondary | ICD-10-CM | POA: Diagnosis not present

## 2024-04-30 DIAGNOSIS — W19XXXA Unspecified fall, initial encounter: Secondary | ICD-10-CM

## 2024-04-30 DIAGNOSIS — M47816 Spondylosis without myelopathy or radiculopathy, lumbar region: Secondary | ICD-10-CM | POA: Diagnosis not present

## 2024-04-30 DIAGNOSIS — I2510 Atherosclerotic heart disease of native coronary artery without angina pectoris: Secondary | ICD-10-CM | POA: Insufficient documentation

## 2024-04-30 DIAGNOSIS — M25551 Pain in right hip: Secondary | ICD-10-CM | POA: Insufficient documentation

## 2024-04-30 DIAGNOSIS — M25451 Effusion, right hip: Secondary | ICD-10-CM | POA: Diagnosis not present

## 2024-04-30 DIAGNOSIS — M25572 Pain in left ankle and joints of left foot: Secondary | ICD-10-CM | POA: Diagnosis not present

## 2024-04-30 DIAGNOSIS — M799 Soft tissue disorder, unspecified: Secondary | ICD-10-CM | POA: Diagnosis not present

## 2024-04-30 DIAGNOSIS — M25512 Pain in left shoulder: Secondary | ICD-10-CM | POA: Diagnosis not present

## 2024-04-30 DIAGNOSIS — M25511 Pain in right shoulder: Secondary | ICD-10-CM | POA: Diagnosis not present

## 2024-04-30 DIAGNOSIS — R6889 Other general symptoms and signs: Secondary | ICD-10-CM | POA: Diagnosis not present

## 2024-04-30 DIAGNOSIS — M160 Bilateral primary osteoarthritis of hip: Secondary | ICD-10-CM | POA: Diagnosis not present

## 2024-04-30 DIAGNOSIS — R600 Localized edema: Secondary | ICD-10-CM | POA: Diagnosis not present

## 2024-04-30 NOTE — ED Provider Notes (Signed)
 [place] EMERGENCY DEPARTMENT AT [facility] Provider Note   CSN: [id number] Arrival date & time: 04/30/24  2008     History  Chief Complaint  Patient presents with   [facility] Boese is a 89 y.o. female.  HPI     This is an 89 year old female who presents by EMS after reported witnessed fall at home.  She cannot provide me any history.  She has severe dementia.  Per EMS report she was noted to have a fall off the toilet.  Was reporting some right hip pain.  She is actually laying on her right hip and is not able to tell me whether anything hurts right now.  She is only oriented to herself.  Level 5 caveat  Home Medications Prior to Admission medications   Medication Sig Start Date End Date Taking? Authorizing Provider  amLODipine  (NORVASC ) 5 MG tablet Take 1 tablet (5 mg total) by mouth daily. 01/11/24   Viola Greulich, MD  atorvastatin  (LIPITOR) 40 MG tablet Take 1 tablet (40 mg total) by mouth daily. 02/27/21   Viola Greulich, MD  donepezil  (ARICEPT ) 5 MG tablet Take 1 tablet (5 mg total) by mouth at bedtime. 05/07/19   Viola Greulich, MD  Incontinence Supply Disposable (PROCARE ADULT BRIEFS X-LARGE) MISC Use as directed. 03/09/23   Viola Greulich, MD  latanoprost (XALATAN) 0.005 % ophthalmic solution Place 1 drop into both eyes at bedtime. 02/24/23   [provider]  levothyroxine  (SYNTHROID ) 88 MCG tablet Take 1 tablet (88 mcg total) by mouth every morning. 01/11/24   Viola Greulich, MD  LORazepam  (ATIVAN ) 0.5 MG tablet TAKE 1 TABLET BY MOUTH AT BEDTIME AS NEEDED FOR ANXIETY. 06/27/23   Viola Greulich, MD  magnesium oxide (MAG-OX) 400 (240 Mg) MG tablet Take 400 mg by mouth daily.    [provider]  risperiDONE  (RISPERDAL ) 0.5 MG tablet TAKE 1 TABLET BY MOUTH 2 TIMES DAILY. 04/30/24   Viola Greulich, MD  sertraline  (ZOLOFT ) 25 MG tablet Take 1 tablet (25 mg total) by mouth daily. 01/11/24   Viola Greulich, MD  SIMBRINZA 1-0.2 %  SUSP Apply to eye. 12/27/22   [provider]      Allergies    Other, Pollen extract, Latex, Morphine and codeine, Nickel, and Penicillins    Review of Systems   Review of Systems  Unable to perform ROS: Dementia    Physical Exam Updated Vital Signs BP (!) 151/52   Pulse 75   Resp 16   SpO2 100%  Physical Exam Vitals and nursing note reviewed.  Constitutional:      Appearance: She is well-developed. She is not ill-appearing.  HENT:     Head: Normocephalic and atraumatic.     Mouth/Throat:     Mouth: Mucous membranes are moist.  Eyes:     Pupils: Pupils are equal, round, and reactive to light.  Cardiovascular:     Rate and Rhythm: Normal rate and regular rhythm.     Heart sounds: Normal heart sounds.  Pulmonary:     Effort: Pulmonary effort is normal. No respiratory distress.     Breath sounds: No wheezing.  Abdominal:     Palpations: Abdomen is soft.  Musculoskeletal:     Cervical back: Neck supple.     Comments: Patient lying on her right hip, no obvious foreshortening, patient neurovascular intact distally  Skin:    General: Skin is warm and dry.  Neurological:     Mental Status: She is alert.     Comments: Oriented only to self  Psychiatric:        Mood and Affect: Mood normal.     ED Results / Procedures / Treatments   Labs (all labs ordered are listed, but only abnormal results are displayed) Labs Reviewed - No data to display   EKG None  Radiology CT Hip Right Wo Contrast Result Date: 05/01/2024 CLINICAL DATA:  Trauma EXAM: CT OF THE RIGHT HIP WITHOUT CONTRAST TECHNIQUE: Multidetector CT imaging of the right hip was performed according to the standard protocol. Multiplanar CT image reconstructions were also generated. RADIATION DOSE REDUCTION: This exam was performed according to the departmental dose-optimization program which includes automated exposure control, adjustment of the mA and/or kV according to patient size and/or use of iterative  reconstruction technique. COMPARISON:  Right hip x-ray 04/30/2024 FINDINGS: Bones/Joint/Cartilage The bones are diffusely osteopenic. No acute fracture or dislocation identified. There is severe degenerative changes of the left hip with bone-on-bone configuration, subchondral cystic change and sclerosis. There also severe/end-stage degenerative changes of the right acetabulum with bone-on-bone configuration, sclerosis, subchondral cystic change and remodeling of the femoral head and acetabulum. Right hip joint effusion is present Ligaments Suboptimally assessed by CT. Muscles and Tendons No focal hematoma. Soft tissues There is edema and skin thickening lateral to the right hip. IMPRESSION: 1. No acute fracture or dislocation. 2. Severe/end-stage degenerative changes of the right hip. 3. Severe degenerative changes of the left hip. 4. Right hip joint effusion. 5. Edema and skin thickening lateral to the right hip. Electronically Signed   By: Tyron Gallon M.D.   On: 05/01/2024 00:12   DG Shoulder Left Result Date: 04/30/2024 CLINICAL DATA:  Left shoulder pain after a fall. EXAM: LEFT SHOULDER - 2+ VIEW COMPARISON:  None Available. FINDINGS: Degenerative changes in the glenohumeral joint. Prominent subacromial spurs. No evidence of acute fracture or dislocation. No focal bone lesion or bone destruction. Soft tissues are unremarkable. IMPRESSION: Degenerative changes in the left shoulder with large subacromial spurs. No acute displaced fractures identified. Electronically Signed   By: Boyce Byes M.D.   On: 04/30/2024 22:07   DG Hip Unilat W or Wo Pelvis 2-3 Views Right Result Date: 04/30/2024 CLINICAL DATA:  Right hip and left shoulder pain after a fall. EXAM: DG HIP (WITH OR WITHOUT PELVIS) 2-3V RIGHT COMPARISON:  03/25/2021 FINDINGS: Severe degenerative changes in the right hip with joint space narrowing and sclerosis, moderate osteophyte formation, and remodeling of the femoral head. This is progressing  since the previous study. Suggestion of fragmentation along the greater trochanter which may indicate an acute fracture. CT suggested for further evaluation. Visualized pelvis appears intact. Degenerative changes in the lumbar spine and left hip. Calcifications in the pelvis consistent with uterine fibroids. IMPRESSION: 1. Severe degenerative changes in the right hip, progressing since prior study. 2. Cortical irregularity and fragmentation suggested of the greater trochanter possibly representing acute fracture. CT suggested for further evaluation. Electronically Signed   By: Boyce Byes M.D.   On: 04/30/2024 22:06    Procedures Procedures    Medications Ordered in ED Medications - No data to display  ED Course/ Medical Decision Making/ A&P Clinical Course as of 05/01/24 0301  Tue May 01, 2024  0300 Patient is nonambulatory per her family.  She uses a wheelchair because of her chronic hip pain. [CH]    Clinical Course User Index [CH] Zacharey Jensen, Vonzella Guernsey, MD  Medical Decision Making Amount and/or Complexity of Data Reviewed Labs: ordered. Radiology: ordered.   This patient presents to the ED for concern of fall, this involves an extensive number of treatment options, and is a complaint that carries with it a high risk of complications and morbidity.  I considered the following differential and admission for this acute, potentially life threatening condition.  The differential diagnosis includes fracture, dislocation, contusion  MDM:    This is an 89 year old female who presents after reported fall.  She is reportedly at her baseline.  It was a witnessed fall per EMS and she did not hit her head.  She is not on any anticoagulants.  Significant history of dementia so she does not provide much additional collateral information.  No obvious traumatic injury on exam.  X-rays were nondiagnostic and there was some concern for potential occult injury on her  x-ray of her hip.  CT imaging was obtained hide does not show any evidence of acute fracture.  At baseline she is nonambulatory.  Will plan for discharge home.  (Labs, imaging, consults)  Labs: I Ordered, and personally interpreted labs.  The pertinent results include: None  Imaging Studies ordered: I ordered imaging studies including x-ray shoulder, hip, CT hip I independently visualized and interpreted imaging. I agree with the radiologist interpretation  Additional history obtained from chart review.  External records from outside source obtained and reviewed including prior evaluations  Cardiac Monitoring: The patient was not maintained on a cardiac monitor.  If on the cardiac monitor, I personally viewed and interpreted the cardiac monitored which showed an underlying rhythm of: N/A  Reevaluation: After the interventions noted above, I reevaluated the patient and found that they have :stayed the same  Social Determinants of Health:  dementia  Disposition: Discharge  Co morbidities that complicate the patient evaluation  Past Medical History:  Diagnosis Date   Anginal pain (HCC)    1990's; 09/28/2012   Arthritis    "very mild" (09/28/2012)   Coronary artery disease 09/27/2012   s/p PCI of the RCA with DES with remote PCI in [place] 15 to 20 years ago. 2. cath 02/2013 patent mid RCA stent, 30% left main, 70% mid to distaal tanden 70% lesions x 3, 60% mid diag, 50% ostial LCx, 40% mid LCx.    Glaucoma    Hyperlipidemia    Hypothyroidism      Medicines No orders of the defined types were placed in this encounter.   I have reviewed the patients home medicines and have made adjustments as needed  Problem List / ED Course: Problem List Items Addressed This Visit   None Visit Diagnoses       Fall, initial encounter    -  Primary                   Final Clinical Impression(s) / ED Diagnoses Final diagnoses:  Fall, initial encounter    Rx / DC  Orders ED Discharge Orders     None         Jayline Kilburg, Vonzella Guernsey, MD 05/01/24 [phone]

## 2024-04-30 NOTE — ED Triage Notes (Signed)
 PER EMS: pt is from home with c/o witnessed mechanical fall. Family states she fell off the toilet. She was reporting right hip pain and left shoulder pain. No deformity, no shortening or rotation. She has dementia and is at baseline mentation, alert to self. Denies head injury, LOC, no thinners.   BP-142/62, HR-92, RR-18, CBG-128

## 2024-05-01 DIAGNOSIS — R600 Localized edema: Secondary | ICD-10-CM | POA: Diagnosis not present

## 2024-05-01 DIAGNOSIS — Z743 Need for continuous supervision: Secondary | ICD-10-CM | POA: Diagnosis not present

## 2024-05-01 DIAGNOSIS — M799 Soft tissue disorder, unspecified: Secondary | ICD-10-CM | POA: Diagnosis not present

## 2024-05-01 DIAGNOSIS — M160 Bilateral primary osteoarthritis of hip: Secondary | ICD-10-CM | POA: Diagnosis not present

## 2024-05-01 DIAGNOSIS — M25451 Effusion, right hip: Secondary | ICD-10-CM | POA: Diagnosis not present

## 2024-05-01 DIAGNOSIS — R404 Transient alteration of awareness: Secondary | ICD-10-CM | POA: Diagnosis not present

## 2024-05-01 NOTE — ED Notes (Signed)
 I have spoke to patient daughter, says the patient only gets around in the wheelchair. Unable to ambulate. Pt brief changed, PTAR called. Pt daughter will be at the home on arrival by ptar

## 2024-05-01 NOTE — Discharge Instructions (Addendum)
 You were seen today after a fall.  X-ray and CT imaging is reassuring.

## 2024-05-02 ENCOUNTER — Telehealth: Payer: Self-pay

## 2024-05-02 NOTE — Transitions of Care (Post Inpatient/ED Visit) (Signed)
 05/02/2024  Name: Sabrina Hart MRN: [id number] DOB: [birth year]  Today's TOC FU Call Status: Today's TOC FU Call Status:: Successful TOC FU Call Completed TOC FU Call Complete Date: 05/02/24 Patient's Name and Date of Birth confirmed.  Transition Care Management Follow-up Telephone Call Date of Discharge: 05/01/24 Discharge Facility: [facility]) Type of Discharge: Emergency Department Reason for ED Visit: Other: (Fall) How have you been since you were released from the hospital?: Better Any questions or concerns?: Yes Patient Questions/Concerns:: Pain in arm. Xray negative. Conveyed to daughter. Patient Questions/Concerns Addressed: Provided Patient Educational Materials  Items Reviewed: Did you receive and understand the discharge instructions provided?: Yes Medications obtained,verified, and reconciled?: Yes (Medications Reviewed) Any new allergies since your discharge?: No Dietary orders reviewed?: NA Do you have support at home?: Yes People in Home [RPT]: child(ren), dependent  Medications Reviewed Today: Medications Reviewed Today     Reviewed by Willie Harry, RN (Registered Nurse) on 05/02/24 at 1415  Med List Status: <None>   Medication Order Taking? Sig Documenting Provider Last Dose Status Informant  amLODipine  (NORVASC ) 5 MG tablet [id number] Yes Take 1 tablet (5 mg total) by mouth daily. Viola Greulich, MD  Active   atorvastatin  (LIPITOR) 40 MG tablet [id number]  Take 1 tablet (40 mg total) by mouth daily.  Patient not taking: Reported on 05/02/2024   Viola Greulich, MD  Active   donepezil  (ARICEPT ) 5 MG tablet [id number]  Take 1 tablet (5 mg total) by mouth at bedtime. Viola Greulich, MD  Active   Incontinence Supply Disposable (PROCARE ADULT Lorrene Rosser) MISC [id number]  Use as directed. Viola Greulich, MD  Active   latanoprost (XALATAN) 0.005 % ophthalmic solution [id number]  Place 1 drop into both eyes at bedtime. [provider]  Active    levothyroxine  (SYNTHROID ) 88 MCG tablet [id number] Yes Take 1 tablet (88 mcg total) by mouth every morning. Viola Greulich, MD  Active   LORazepam  (ATIVAN ) 0.5 MG tablet [id number]  TAKE 1 TABLET BY MOUTH AT BEDTIME AS NEEDED FOR ANXIETY.  Patient not taking: Reported on 05/02/2024   Viola Greulich, MD  Active   magnesium oxide (MAG-OX) 400 (240 Mg) MG tablet [id number]  Take 400 mg by mouth daily. [provider]  Active   risperiDONE  (RISPERDAL ) 0.5 MG tablet [id number] Yes TAKE 1 TABLET BY MOUTH 2 TIMES DAILY. Viola Greulich, MD  Active   sertraline  (ZOLOFT ) 25 MG tablet [id number] Yes Take 1 tablet (25 mg total) by mouth daily. Viola Greulich, MD  Active   SIMBRINZA 1-0.2 % SUSP [id number]  Apply to eye. [provider]  Active             Home Care and Equipment/Supplies: Were Home Health Services Ordered?: No Any new equipment or medical supplies ordered?: No  Functional Questionnaire: Do you need assistance with bathing/showering or dressing?: Yes Do you need assistance with meal preparation?: Yes Do you need assistance with eating?: Yes Do you have difficulty maintaining continence: Yes Do you need assistance with getting out of bed/getting out of a chair/moving?: Yes Do you have difficulty managing or taking your medications?: Yes  Follow up appointments reviewed: PCP Follow-up appointment confirmed?: Yes (Will follow up with Psychologist per daughter) MD Provider Line Number:[phone] Given: No Date of PCP follow-up appointment?: 05/14/24 Follow-up Provider: Dr. Cathleen Coach [facility] Follow-up appointment confirmed?: No Reason Specialist Follow-Up Not Confirmed: Patient has Specialist Provider Number and will Call for Appointment  Do you need transportation to your follow-up appointment?: No Do you understand care options if your condition(s) worsen?: Yes-patient verbalized understanding    SIGNATURE: Horatio Lynch, BSN, RN [employer]

## 2024-05-14 ENCOUNTER — Encounter: Payer: Self-pay | Admitting: Family Medicine

## 2024-05-14 ENCOUNTER — Ambulatory Visit (INDEPENDENT_AMBULATORY_CARE_PROVIDER_SITE_OTHER): Admitting: Family Medicine

## 2024-05-14 VITALS — BP 102/64 | HR 75 | Temp 98.6°F | Ht 63.0 in

## 2024-05-14 DIAGNOSIS — G301 Alzheimer's disease with late onset: Secondary | ICD-10-CM | POA: Diagnosis not present

## 2024-05-14 DIAGNOSIS — W19XXXD Unspecified fall, subsequent encounter: Secondary | ICD-10-CM

## 2024-05-14 DIAGNOSIS — E039 Hypothyroidism, unspecified: Secondary | ICD-10-CM

## 2024-05-14 DIAGNOSIS — M1611 Unilateral primary osteoarthritis, right hip: Secondary | ICD-10-CM

## 2024-05-14 DIAGNOSIS — I10 Essential (primary) hypertension: Secondary | ICD-10-CM | POA: Diagnosis not present

## 2024-05-14 DIAGNOSIS — R058 Other specified cough: Secondary | ICD-10-CM

## 2024-05-14 DIAGNOSIS — R2241 Localized swelling, mass and lump, right lower limb: Secondary | ICD-10-CM | POA: Diagnosis not present

## 2024-05-14 DIAGNOSIS — E782 Mixed hyperlipidemia: Secondary | ICD-10-CM | POA: Diagnosis not present

## 2024-05-14 DIAGNOSIS — F02C18 Dementia in other diseases classified elsewhere, severe, with other behavioral disturbance: Secondary | ICD-10-CM

## 2024-05-14 LAB — CBC WITH DIFFERENTIAL/PLATELET
Basophils Absolute: 0 10*3/uL (ref 0.0–0.1)
Basophils Relative: 0.8 % (ref 0.0–3.0)
Eosinophils Absolute: 0.1 10*3/uL (ref 0.0–0.7)
Eosinophils Relative: 2.2 % (ref 0.0–5.0)
HCT: 40.7 % (ref 36.0–46.0)
Hemoglobin: 13.6 g/dL (ref 12.0–15.0)
Lymphocytes Relative: 26 % (ref 12.0–46.0)
Lymphs Abs: 1.2 10*3/uL (ref 0.7–4.0)
MCHC: 33.3 g/dL (ref 30.0–36.0)
MCV: 85.9 fl (ref 78.0–100.0)
Monocytes Absolute: 0.5 10*3/uL (ref 0.1–1.0)
Monocytes Relative: 12 % (ref 3.0–12.0)
Neutro Abs: 2.7 10*3/uL (ref 1.4–7.7)
Neutrophils Relative %: 59 % (ref 43.0–77.0)
Platelets: 468 10*3/uL — ABNORMAL HIGH (ref 150.0–400.0)
RBC: 4.74 Mil/uL (ref 3.87–5.11)
RDW: 15 % (ref 11.5–15.5)
WBC: 4.6 10*3/uL (ref 4.0–10.5)

## 2024-05-14 LAB — BASIC METABOLIC PANEL WITH GFR
BUN: 15 mg/dL (ref 6–23)
CO2: 30 meq/L (ref 19–32)
Calcium: 9.3 mg/dL (ref 8.4–10.5)
Chloride: 95 meq/L — ABNORMAL LOW (ref 96–112)
Creatinine, Ser: 0.54 mg/dL (ref 0.40–1.20)
GFR: 81.32 mL/min (ref >60.00)
Glucose, Bld: 82 mg/dL (ref 70–99)
Potassium: 3.9 meq/L (ref 3.5–5.1)
Sodium: 133 meq/L — ABNORMAL LOW (ref 135–145)

## 2024-05-14 NOTE — Patient Instructions (Signed)
 You can use over-the-counter Voltaren gel for right hip pain up to 3 times a day.  Follow the dosing instructions with the card provided in packaging.

## 2024-05-14 NOTE — Progress Notes (Signed)
 Established Patient Office Visit   Subjective  Patient ID: Sabrina Hart, female    DOB: 1932/09/08  Age: 89 y.o. MRN: [id number]  Chief Complaint  Patient presents with   Medical Management of Chronic Issues    Hospital follow-up, for a fall, patient has a cough and runny nose, that started 2 weeks ago, knot and wounds on legs  Pt accompanied by her daughter.  Patient is an 89 year old female seen for ED follow-up.  Patient seen in ED 04/30/2024 status post fall off toilet with reported right hip pain.  X-ray right hip with severe degenerative changes of right hip.  CT recommended due to concern for possible fracture.  CT right hip negative for fracture.  Pt immobile at home.  Sits with knees pressed together.  At night sleeps on side and rubs knees together.  Caused skin breakdown that has healed.  Now there is a firm not below R knee at area.  Pt with occasional light cough.  Pt's daughter having to cut food in smaller pieces for pt.  Drinking through a straw helps.    Patient Active Problem List   Diagnosis Date Noted   Bilateral primary osteoarthritis of hip 08/28/2023   Essential hypertension 08/28/2023   Primary open angle glaucoma (POAG) of both eyes 08/28/2023   Severe late onset Alzheimer's dementia (HCC) 04/12/2019   Lower extremity edema 04/26/2017   Unstable angina (HCC) 03/02/2013   Hypothyroidism 09/29/2012   Hyperlipidemia 09/29/2012   Hypokalemia 09/29/2012   CAD (coronary artery disease) 09/27/2012   Past Medical History:  Diagnosis Date   Anginal pain [facility])    1990's; 09/28/2012   Arthritis    very mild (09/28/2012)   Coronary artery disease 09/27/2012   s/p PCI of the RCA with DES with remote PCI in [place] 15 to 20 years ago. 2. cath 02/2013 patent mid RCA stent, 30% left main, 70% mid to distaal tanden 70% lesions x 3, 60% mid diag, 50% ostial LCx, 40% mid LCx.    Glaucoma    Hyperlipidemia    Hypothyroidism    Past Surgical History:   Procedure Laterality Date   CARDIAC CATHETERIZATION  1990s   PCI   CATARACT EXTRACTION W/ INTRAOCULAR LENS  IMPLANT, BILATERAL  1970's   CORONARY ANGIOPLASTY WITH STENT PLACEMENT  09/28/2012   20-30% diffuse LAD stenosis, mild-mod D1 dz, 70% distal D1 lesion, mild diffuse LCx dz, 50-60% OM1 stenosis, 60% mid & 90% mid-dis RCA stenosis s/p DES; LVEF 75-80% w/ hyperdynamic fxn.    DENTAL SURGERY     implants   INNER EAR SURGERY  ~ 2010   put plugs in so I could fly (09/28/2012)   LEFT HEART CATHETERIZATION WITH CORONARY ANGIOGRAM N/A 09/28/2012   Procedure: LEFT HEART CATHETERIZATION WITH CORONARY ANGIOGRAM;  Surgeon: Lake Pilgrim, MD;  Location[facility] CATH LAB;  Service: Cardiovascular;  Laterality: N/A;   LUMBAR DISC SURGERY  1980's   took out a little piece (09/28/2012)   PERCUTANEOUS CORONARY STENT INTERVENTION (PCI-S)  09/28/2012   Procedure: PERCUTANEOUS CORONARY STENT INTERVENTION (PCI-S);  Surgeon: Lake Pilgrim, MD;  Location: [facility] CATH LAB;  Service: Cardiovascular;;   REDUCTION MAMMAPLASTY  1970's   Social History   Tobacco Use   Smoking status: Never   Smokeless tobacco: Never  Vaping Use   Vaping status: Never Used  Substance Use Topics   Alcohol use: Yes    Alcohol/week: 1.0 standard drink of alcohol    Types: 1 Glasses of wine  per week    Comment: 09/28/2012 1 glass of wine averages once/wk; beer average once/week during summer w/friends   Drug use: No   History reviewed. No pertinent family history. Allergies  Allergen Reactions   Other Other (See Comments)    Metals; skin gets weepy; gets worse if it makes contact (09/28/2012)   Pollen Extract Other (See Comments)    sneezing; watery eyes   Latex Itching and Swelling    Skin swells   Morphine And Codeine Rash   Nickel Other (See Comments)    Causes weepiness (skin)   Penicillins Rash    Has patient had a PCN reaction causing immediate rash, facial/tongue/throat swelling, SOB or lightheadedness  with hypotension: Yes Has patient had a PCN reaction causing severe rash involving mucus membranes or skin necrosis: Unk Has patient had a PCN reaction that required hospitalization: No  Has patient had a PCN reaction occurring within the last 10 years: No If all of the above answers are NO, then may proceed with Cephalosporin use.     ROS Negative unless stated above    Objective:     BP 102/64 (BP Location: Right Arm, Patient Position: Sitting, Cuff Size: Normal)   Pulse 75   Temp 98.6 F (37 C) (Oral)   Ht 5' 3 (1.6 m)   SpO2 98%   BMI 26.04 kg/m  BP Readings from Last 3 Encounters:  05/14/24 102/64  05/01/24 (!) 151/52  10/31/23 120/72   Wt Readings from Last 3 Encounters:  12/30/22 147 lb (66.7 kg)  03/25/21 138 lb 12.8 oz (63 kg)  02/25/21 136 lb (61.7 kg)      Physical Exam Constitutional:      Comments: Pleasantly demented.  HENT:     Head: Normocephalic and atraumatic.     Nose: Nose normal.     Mouth/Throat:     Mouth: Mucous membranes are moist.   Cardiovascular:     Rate and Rhythm: Normal rate.  Pulmonary:     Effort: Pulmonary effort is normal.     Breath sounds: Normal breath sounds.   Skin:    General: Skin is warm and dry.         Comments: Healing skin abrasion of medial R lower leg inferior to medial knee.   Neurological:     Mental Status: She is alert.     Comments: Oriented to person.  Psychiatric:        Behavior: Behavior is cooperative.        03/09/2023    1:06 PM 12/30/2022    1:12 PM 02/25/2021   11:41 AM  Depression screen PHQ 2/9  Decreased Interest 0 0 0  Down, Depressed, Hopeless 1 0 0  PHQ - 2 Score 1 0 0  Altered sleeping 3 2 1   Tired, decreased energy 2 3 1   Change in appetite 0 0 0  Feeling bad or failure about yourself  1 1 0  Trouble concentrating 3 3 0  Moving slowly or fidgety/restless 3 3 0  Suicidal thoughts 0 0   PHQ-9 Score 13 12 2   Difficult doing work/chores Extremely dIfficult Extremely  dIfficult Not difficult at all      03/09/2023    1:06 PM  GAD 7 : Generalized Anxiety Score  Nervous, Anxious, on Edge 3  Control/stop worrying 3  Worry too much - different things 3  Trouble relaxing 3  Restless 1  Easily annoyed or irritable 2  Afraid - awful might happen 2  Total GAD 7 Score 17  Anxiety Difficulty Extremely difficult     No results found for any visits on 05/14/24.    Assessment & Plan:   Arthritis of right hip  Mass of right lower leg -     VAS US  LOWER EXTREMITY VENOUS (DVT); Future -     D-dimer, quantitative; Future -     CBC with Differential/Platelet; Future  Severe late onset Alzheimer's dementia with other behavioral disturbance (HCC)  Other cough -     CBC with Differential/Platelet; Future  Essential hypertension -     Basic metabolic panel with GFR  Acquired hypothyroidism -     TSH; Future  Fall, subsequent encounter  Mixed hyperlipidemia  Patient with severe arthritis in right hip per recent imaging from ED.  Discussed using OTC Voltaren gel.  Patient also encouraged to move legs while sitting/do chair exercises.  Doppler ultrasound ordered to rule out DVT for superficial mass right lower leg.  BP well-controlled.  Continue Norvasc  5 mg daily.  Obtain labs.  Continue Synthroid  88 mcg daily.  Obtain labs.  Fall prevention.  Severe dementia.  Declined Aricept  in the past.  Continue to monitor.  Return in about 4 months (around 09/13/2024), or if symptoms worsen or fail to improve.   Viola Greulich, MD

## 2024-05-15 LAB — TSH: TSH: 5.73 u[IU]/mL — ABNORMAL HIGH (ref 0.35–5.50)

## 2024-05-15 LAB — D-DIMER, QUANTITATIVE: D-Dimer, Quant: 1.51 ug{FEU}/mL — ABNORMAL HIGH (ref <0.50)

## 2024-05-18 ENCOUNTER — Ambulatory Visit (HOSPITAL_COMMUNITY)

## 2024-05-21 ENCOUNTER — Ambulatory Visit: Payer: Self-pay | Admitting: Family Medicine

## 2024-05-21 DIAGNOSIS — E871 Hypo-osmolality and hyponatremia: Secondary | ICD-10-CM

## 2024-05-21 DIAGNOSIS — E039 Hypothyroidism, unspecified: Secondary | ICD-10-CM

## 2024-05-22 ENCOUNTER — Other Ambulatory Visit: Payer: Self-pay | Admitting: Family Medicine

## 2024-05-22 DIAGNOSIS — I10 Essential (primary) hypertension: Secondary | ICD-10-CM

## 2024-05-22 DIAGNOSIS — R058 Other specified cough: Secondary | ICD-10-CM

## 2024-05-22 DIAGNOSIS — R2241 Localized swelling, mass and lump, right lower limb: Secondary | ICD-10-CM

## 2024-05-22 DIAGNOSIS — E782 Mixed hyperlipidemia: Secondary | ICD-10-CM

## 2024-05-22 DIAGNOSIS — E039 Hypothyroidism, unspecified: Secondary | ICD-10-CM

## 2024-05-22 DIAGNOSIS — W19XXXD Unspecified fall, subsequent encounter: Secondary | ICD-10-CM

## 2024-05-22 DIAGNOSIS — L97911 Non-pressure chronic ulcer of unspecified part of right lower leg limited to breakdown of skin: Secondary | ICD-10-CM

## 2024-05-22 DIAGNOSIS — G301 Alzheimer's disease with late onset: Secondary | ICD-10-CM

## 2024-05-22 DIAGNOSIS — M1611 Unilateral primary osteoarthritis, right hip: Secondary | ICD-10-CM

## 2024-05-25 ENCOUNTER — Ambulatory Visit: Admitting: Student-PharmD

## 2024-05-25 ENCOUNTER — Other Ambulatory Visit (HOSPITAL_COMMUNITY): Payer: Self-pay

## 2024-05-25 ENCOUNTER — Encounter: Payer: Self-pay | Admitting: Student-PharmD

## 2024-05-25 ENCOUNTER — Ambulatory Visit (HOSPITAL_COMMUNITY)
Admission: RE | Admit: 2024-05-25 | Discharge: 2024-05-25 | Disposition: A | Discharge status: Home / Self Care | Source: Ambulatory Visit | Attending: Family Medicine

## 2024-05-25 DIAGNOSIS — L97911 Non-pressure chronic ulcer of unspecified part of right lower leg limited to breakdown of skin: Secondary | ICD-10-CM | POA: Diagnosis not present

## 2024-05-25 DIAGNOSIS — R2241 Localized swelling, mass and lump, right lower limb: Secondary | ICD-10-CM | POA: Diagnosis not present

## 2024-05-25 DIAGNOSIS — I82411 Acute embolism and thrombosis of right femoral vein: Secondary | ICD-10-CM | POA: Insufficient documentation

## 2024-05-25 MED ORDER — APIXABAN (ELIQUIS) VTE STARTER PACK (10MG AND 5MG)
ORAL_TABLET | ORAL | 0 refills | Status: DC
  Filled 2024-05-25: qty 74, 30d supply, fill #0

## 2024-05-25 MED ORDER — APIXABAN 5 MG PO TABS: 5.0000 mg | ORAL_TABLET | Freq: Two times a day (BID) | ORAL | 1 refills | Status: DC

## 2024-05-25 NOTE — Progress Notes (Signed)
 DVT Clinic Note  Name: Sabrina Hart     MRN: [id number]     DOB: 11-Dec-1931     Sex: female  PCP: [name], MD  Today's Visit: Visit Information: Initial Visit  Referred to DVT Clinic by: Primary Care - Dr. Mercer Referred to CPP by: Dr. Pearline Reason for referral:  Chief Complaint  Patient presents with   DVT   HISTORY OF PRESENT ILLNESS: Sabrina Hart is a 89 y.o. female with PMH HTN, HLD, CAD, hypothyroidism, severe dementia, who presents after diagnosis of DVT for medication management. Patient had a fall 04/30/24 and presented to the ED with right sided pain. X-ray of her hip was concerning for possible fracture but CT imaging was obtained and showed no fracture. She followed up with her PCP 05/14/24 and noted a nodule on the front/inside of her leg right below her knee, where she sits and sleeps with her legs together. Ultrasound was ordered which was completed today and shows extensive acute nonocclusive in the RLE, and she was referred to DVT Clinic to initiate treatment. Patient arrives in a wheelchair and is accompanied by her daughter and grandson. Patient denies pain, chest pain, SOB. Daughter confirms she has not shown any signs of these at home. No swelling on exam. Daughter notes that she initially had swelling in her right upper leg after the fall but it has resolved. Denies any prior history of DVT, denies history of major bleeding.   Positive Thrombotic Risk Factors: Recent trauma (within 3 months), Older Age Bleeding Risk Factors: Age >65 years  Negative Thrombotic Risk Factors: Previous VTE, Recent surgery (within 3 months), Recent admission to hospital with acute illness (within 3 months), Paralysis, paresis, or recent plaster cast immobilization of lower extremity, Central venous catheterization, Bed rest >72 hours within 3 months, Sedentary journey lasting >8 hours within 4 weeks, Pregnancy, Within 6 weeks postpartum, Recent cesarean section (within 3  months), Estrogen therapy, Testosterone therapy, Recent COVID diagnosis (within 3 months), Active cancer, Erythropoiesis-stimulating agent, Non-malignant, chronic inflammatory condition, Known thrombophilic condition, Smoking, Obesity  Rx Insurance Coverage: Medicare Rx Affordability: Patient still has a $255 deductible so initial fill will be $302 followed by $47/month after that. Used one time free card to fill starter pack today. Rx Assistance Provided: Free 30-day trial card Preferred Pharmacy: Starter pack filled at [facility] pharmacy. [facility]ills [facility]'s preferred [facility].   Past Medical History:  Diagnosis Date   Anginal pain [facility])    1990's; 09/28/2012   Arthritis    very mild (09/28/2012)   Coronary artery disease 09/27/2012   s/p PCI of the RCA with DES with remote PCI in [place] 15 to 20 years ago. 2. cath 02/2013 patent mid RCA stent, 30% left main, 70% mid to distaal tanden 70% lesions x 3, 60% mid diag, 50% ostial LCx, 40% mid LCx.    Glaucoma    Hyperlipidemia    Hypothyroidism     Past Surgical History:  Procedure Laterality Date   CARDIAC CATHETERIZATION  1990s   PCI   CATARACT EXTRACTION W/ INTRAOCULAR LENS  IMPLANT, BILATERAL  1970's   CORONARY ANGIOPLASTY WITH STENT PLACEMENT  09/28/2012   20-30% diffuse LAD stenosis, mild-mod D1 dz, 70% distal D1 lesion, mild diffuse LCx dz, 50-60% OM1 stenosis, 60% mid & 90% mid-dis RCA stenosis s/p DES; LVEF 75-80% w/ hyperdynamic fxn.    DENTAL SURGERY     implants   INNER EAR SURGERY  ~ 2010   put plugs  in so I could fly (09/28/2012)   LEFT HEART CATHETERIZATION WITH CORONARY ANGIOGRAM N/A 09/28/2012   Procedure: LEFT HEART CATHETERIZATION WITH CORONARY ANGIOGRAM;  Surgeon: Aleene JINNY Passe, MD;  Location: [facility] CATH LAB;  Service: Cardiovascular;  Laterality: N/A;   LUMBAR DISC SURGERY  1980's   took out a little piece (09/28/2012)   PERCUTANEOUS CORONARY STENT INTERVENTION (PCI-S)  09/28/2012   Procedure: PERCUTANEOUS  CORONARY STENT INTERVENTION (PCI-S);  Surgeon: Aleene JINNY Passe, MD;  Location: [facility] CATH LAB;  Service: Cardiovascular;;   REDUCTION MAMMAPLASTY  1970's    Social History   Socioeconomic History   Marital status: Single    Spouse name: Not on file   Number of children: Not on file   Years of education: Not on file   Highest education level: Not on file  Occupational History   Not on file  Tobacco Use   Smoking status: Never   Smokeless tobacco: Never  Vaping Use   Vaping status: Never Used  Substance and Sexual Activity   Alcohol use: Yes    Alcohol/week: 1.0 standard drink of alcohol    Types: 1 Glasses of wine per week    Comment: 09/28/2012 1 glass of wine averages once/wk; beer average once/week during summer w/friends   Drug use: No   Sexual activity: Never  Other Topics Concern   Not on file  Social History Narrative   Not on file   Social Drivers of Health   Financial Resource Strain: Not on file  Food Insecurity: Not on file  Transportation Needs: Not on file  Physical Activity: Not on file  Stress: Not on file  Social Connections: Not on file  Intimate Partner Violence: Not on file    History reviewed. No pertinent family history.  Allergies as of 05/25/2024 - Review Complete 05/25/2024  Allergen Reaction Noted   Other Other (See Comments) 09/28/2012   Pollen extract Other (See Comments) 09/28/2012   Latex Itching and Swelling 09/28/2012   Morphine and codeine Rash 09/27/2012   Nickel Other (See Comments) 10/29/2017   Penicillins Rash 09/27/2012    Current Outpatient Medications on File Prior to Visit  Medication Sig Dispense Refill   amLODipine  (NORVASC ) 5 MG tablet Take 1 tablet (5 mg total) by mouth daily. 90 tablet 3   atorvastatin  (LIPITOR) 40 MG tablet Take 1 tablet (40 mg total) by mouth daily. (Patient not taking: Reported on 05/02/2024) 30 tablet 4   donepezil  (ARICEPT ) 5 MG tablet Take 1 tablet (5 mg total) by mouth at bedtime. 30 tablet 3    Incontinence Supply Disposable (PROCARE ADULT BRIEFS X-LARGE) MISC Use as directed. 50 each 11   latanoprost (XALATAN) 0.005 % ophthalmic solution Place 1 drop into both eyes at bedtime.     levothyroxine  (SYNTHROID ) 88 MCG tablet Take 1 tablet (88 mcg total) by mouth every morning. 90 tablet 3   LORazepam  (ATIVAN ) 0.5 MG tablet TAKE 1 TABLET BY MOUTH AT BEDTIME AS NEEDED FOR ANXIETY. (Patient not taking: Reported on 05/02/2024) 30 tablet 1   magnesium oxide (MAG-OX) 400 (240 Mg) MG tablet Take 400 mg by mouth daily.     risperiDONE  (RISPERDAL ) 0.5 MG tablet TAKE 1 TABLET BY MOUTH 2 TIMES DAILY. 180 tablet 0   sertraline  (ZOLOFT ) 25 MG tablet Take 1 tablet (25 mg total) by mouth daily. 90 tablet 1   SIMBRINZA 1-0.2 % SUSP Apply to eye.     No current facility-administered medications on file prior to visit.   REVIEW  OF SYSTEMS:  Review of Systems  Respiratory:  Negative for shortness of breath.   Cardiovascular:  Negative for chest pain, palpitations and leg swelling.  Musculoskeletal:  Negative for myalgias.  Neurological:  Negative for dizziness and tingling.   PHYSICAL EXAMINATION:  Physical Exam  Musculoskeletal:     Right lower leg: No edema.     Left lower leg: No edema.   Skin:    Findings: No bruising or erythema.   Villalta Score for Post-Thrombotic Syndrome: Pain: Absent Cramps: Absent Heaviness: Absent Paresthesia: Absent Pruritus: Absent Pretibial Edema: Absent Skin Induration: Absent Hyperpigmentation: Absent Redness: Absent Venous Ectasia: Absent Pain on calf compression: Absent Villalta Preliminary Score: 0 Is venous ulcer present?: No If venous ulcer is present and score is <15, then 15 points total are assigned: Absent Villalta Total Score: 0  LABS:  CBC     Component Value Date/Time   WBC 4.6 05/14/2024 1459   RBC 4.74 05/14/2024 1459   HGB 13.6 05/14/2024 1459   HCT 40.7 05/14/2024 1459   PLT 468.0 (H) 05/14/2024 1459   MCV 85.9 05/14/2024 1459    MCH 30.8 06/02/2021 1230   MCHC 33.3 05/14/2024 1459   RDW 15.0 05/14/2024 1459   LYMPHSABS 1.2 05/14/2024 1459   MONOABS 0.5 05/14/2024 1459   EOSABS 0.1 05/14/2024 1459   BASOSABS 0.0 05/14/2024 1459    Hepatic Function      Component Value Date/Time   PROT 6.7 01/02/2024 1258   ALBUMIN 3.4 (L) 01/02/2024 1258   AST 16 01/02/2024 1258   ALT 9 01/02/2024 1258   ALKPHOS 87 01/02/2024 1258   BILITOT 0.4 01/02/2024 1258   BILIDIR 0.1 09/06/2016 0806   IBILI 0.4 09/06/2016 0806    Renal Function   Lab Results  Component Value Date   CREATININE 0.54 05/14/2024   CREATININE 0.66 01/02/2024   CREATININE 0.77 12/30/2022    CrCl cannot be calculated (Unknown ideal weight.).   [facility] Vascular Lab Studies:  05/25/24 VAS US  LOWER EXTREMITY VENOUS (DVT)RIGHT Summary:  RIGHT:  - Findings consistent with acute deep vein thrombosis involving the right  common femoral vein, right femoral vein, right proximal profunda vein, and  right popliteal vein.  - There is no evidence of superficial venous thrombosis.    LEFT:  - No evidence of common femoral vein obstruction.   Technician findings:  Nonocclusive acute DVT seen in the right CFV, profunda vein, FV, popliteal  vein and proximal gastrocnemius veins. Right peroneal veins not well  visualized.   ASSESSMENT: Location of DVT: Right common femoral vein, Right femoral vein, Right popliteal vein Cause of DVT: provoked by a transient risk factor  Patient without prior history of DVT diagnosed with acute nonocclusive RLE DVT extending as proximal as the common femoral vein. Discussed with Dr. Pearline. Given that the thrombus in the CFV is nonocclusive, patient is experiencing nearly no symptoms, and patient's age/comorbidities, no need for vascular surgery intervention such as thrombectomy. Will start anticoagulation with Eliquis. Patient had a fall 04/30/24 with pain on the right side of her body/hip. No fractures or apparent injuries. She  is not ambulatory at baseline and has been this way for >1 year. In light of no recent changes in mobility, DVT likely provoked by recent fall considering initial symptom onset of swelling began after the fall but has since resolved. Baseline DVT risk elevated by her immobility and age as well. Would consider this a first provoked DVT and considering her age, fall risk, and  bleeding risk, Dr. Pearline and I recommend anticoagulation for 3 months. If she were to develop future DVTs, would recommend lifelong anticoagulation as that time if benefits outweighed risks. Counseled patient and family on Eliquis, and all questions have been answered. No concerns related to medication access or adherence. Starter pack filled at our on site pharmacy today and refills sent to her preferred pharmacy.   PLAN: -Start apixaban (Eliquis) 10 mg twice daily for 7 days followed by 5 mg twice daily. -Expected duration of therapy: 3 months. Therapy started on 05/25/24. -Patient educated on purpose, proper use and potential adverse effects of apixaban (Eliquis). -Discussed importance of taking medication around the same time every day. -Advised patient of medications to avoid (NSAIDs, aspirin  doses >100 mg daily). -Educated that Tylenol  (acetaminophen ) is the preferred analgesic to lower the risk of bleeding. -Advised patient to alert all providers of anticoagulation therapy prior to starting a new medication or having a procedure. -Emphasized importance of monitoring for signs and symptoms of bleeding (abnormal bruising, prolonged bleeding, nose bleeds, bleeding from gums, discolored urine, black tarry stools). -Educated patient to present to the ED if emergent signs and symptoms of new thrombosis occur.  Follow up: PCP. DVT Clinic as needed.   Lum Herald, PharmD, [place], CPP Deep Vein Thrombosis Clinic Clinical Pharmacist Practitioner [phone]

## 2024-05-25 NOTE — Patient Instructions (Signed)
-  Start apixaban (Eliquis) 10 mg twice daily for 7 days followed by 5 mg twice daily. -Your [facility]. You may need to call the pharmacy to ask them to fill this when you start to run low on your current supply.  -It is important to take your medication around the same time every day.  -Avoid NSAIDs like ibuprofen (Advil, Motrin) and naproxen (Aleve) as well as aspirin  doses over 100 mg daily. -Tylenol  (acetaminophen ) is the preferred over the counter pain medication to lower the risk of bleeding. -Be sure to alert all of your health care providers that you are taking an anticoagulant prior to starting a new medication or having a procedure. -Monitor for signs and symptoms of bleeding (abnormal bruising, prolonged bleeding, nose bleeds, bleeding from gums, discolored urine, black tarry stools). If you have fallen and hit your head OR if your bleeding is severe or not stopping, seek emergency care.  -Go to the emergency room if emergent signs and symptoms of new clot occur (new or worse swelling and pain in an arm or leg, shortness of breath, chest pain, fast or irregular heartbeats, lightheadedness, dizziness, fainting, coughing up blood) or if you experience a significant color change (pale or blue) in the extremity that has the DVT.  -We recommend you wear compression stockings (20-30 mmHg) as long as you are having swelling or pain. Be sure to purchase the correct size and take them off at night.   If you have any questions or need to reschedule an appointment, please call [phone]. If you are having an emergency, call 911 or present to the nearest emergency room.   What is a DVT?  -Deep vein thrombosis (DVT) is a condition in which a blood clot forms in a vein of the deep venous system which can occur in the lower leg, thigh, pelvis, arm, or neck. This condition is serious and can be life-threatening if the clot travels to the arteries of the lungs and causing a blockage  (pulmonary embolism, PE). A DVT can also damage veins in the leg, which can lead to long-term venous disease, leg pain, swelling, discoloration, and ulcers or sores (post-thrombotic syndrome).  -Treatment may include taking an anticoagulant medication to prevent more clots from forming and the current clot from growing, wearing compression stockings, and/or surgical procedures to remove or dissolve the clot.

## 2024-05-28 ENCOUNTER — Ambulatory Visit: Payer: Self-pay | Admitting: Family Medicine

## 2024-05-28 DIAGNOSIS — Z7409 Other reduced mobility: Secondary | ICD-10-CM

## 2024-06-18 ENCOUNTER — Other Ambulatory Visit: Payer: Self-pay

## 2024-06-18 ENCOUNTER — Inpatient Hospital Stay (HOSPITAL_COMMUNITY)
Admission: EM | Admit: 2024-06-18 | Discharge: 2024-07-11 | DRG: 871 | Disposition: A | Discharge status: Hospice | Attending: Internal Medicine | Admitting: Internal Medicine

## 2024-06-18 ENCOUNTER — Encounter (HOSPITAL_COMMUNITY): Payer: Self-pay

## 2024-06-18 ENCOUNTER — Emergency Department (HOSPITAL_COMMUNITY)

## 2024-06-18 DIAGNOSIS — R64 Cachexia: Secondary | ICD-10-CM | POA: Diagnosis not present

## 2024-06-18 DIAGNOSIS — Z452 Encounter for adjustment and management of vascular access device: Secondary | ICD-10-CM | POA: Diagnosis not present

## 2024-06-18 DIAGNOSIS — F0280 Dementia in other diseases classified elsewhere without behavioral disturbance: Secondary | ICD-10-CM | POA: Diagnosis present

## 2024-06-18 DIAGNOSIS — N170 Acute kidney failure with tubular necrosis: Secondary | ICD-10-CM | POA: Diagnosis present

## 2024-06-18 DIAGNOSIS — R4589 Other symptoms and signs involving emotional state: Secondary | ICD-10-CM | POA: Diagnosis not present

## 2024-06-18 DIAGNOSIS — R600 Localized edema: Secondary | ICD-10-CM | POA: Diagnosis not present

## 2024-06-18 DIAGNOSIS — L02415 Cutaneous abscess of right lower limb: Secondary | ICD-10-CM | POA: Diagnosis not present

## 2024-06-18 DIAGNOSIS — E860 Dehydration: Secondary | ICD-10-CM | POA: Diagnosis present

## 2024-06-18 DIAGNOSIS — R6510 Systemic inflammatory response syndrome (SIRS) of non-infectious origin without acute organ dysfunction: Secondary | ICD-10-CM | POA: Diagnosis present

## 2024-06-18 DIAGNOSIS — I2510 Atherosclerotic heart disease of native coronary artery without angina pectoris: Secondary | ICD-10-CM | POA: Diagnosis present

## 2024-06-18 DIAGNOSIS — E162 Hypoglycemia, unspecified: Secondary | ICD-10-CM | POA: Diagnosis present

## 2024-06-18 DIAGNOSIS — L89323 Pressure ulcer of left buttock, stage 3: Secondary | ICD-10-CM | POA: Diagnosis present

## 2024-06-18 DIAGNOSIS — Z91048 Other nonmedicinal substance allergy status: Secondary | ICD-10-CM

## 2024-06-18 DIAGNOSIS — N289 Disorder of kidney and ureter, unspecified: Secondary | ICD-10-CM | POA: Diagnosis not present

## 2024-06-18 DIAGNOSIS — L8990 Pressure ulcer of unspecified site, unspecified stage: Secondary | ICD-10-CM

## 2024-06-18 DIAGNOSIS — Z79899 Other long term (current) drug therapy: Secondary | ICD-10-CM

## 2024-06-18 DIAGNOSIS — Z91040 Latex allergy status: Secondary | ICD-10-CM

## 2024-06-18 DIAGNOSIS — Z515 Encounter for palliative care: Secondary | ICD-10-CM

## 2024-06-18 DIAGNOSIS — Z9842 Cataract extraction status, left eye: Secondary | ICD-10-CM

## 2024-06-18 DIAGNOSIS — Z66 Do not resuscitate: Secondary | ICD-10-CM | POA: Diagnosis not present

## 2024-06-18 DIAGNOSIS — L89210 Pressure ulcer of right hip, unstageable: Secondary | ICD-10-CM | POA: Diagnosis not present

## 2024-06-18 DIAGNOSIS — Z7901 Long term (current) use of anticoagulants: Secondary | ICD-10-CM

## 2024-06-18 DIAGNOSIS — H401130 Primary open-angle glaucoma, bilateral, stage unspecified: Secondary | ICD-10-CM | POA: Diagnosis not present

## 2024-06-18 DIAGNOSIS — E039 Hypothyroidism, unspecified: Secondary | ICD-10-CM | POA: Diagnosis not present

## 2024-06-18 DIAGNOSIS — F02C0 Dementia in other diseases classified elsewhere, severe, without behavioral disturbance, psychotic disturbance, mood disturbance, and anxiety: Secondary | ICD-10-CM | POA: Diagnosis present

## 2024-06-18 DIAGNOSIS — Z9841 Cataract extraction status, right eye: Secondary | ICD-10-CM

## 2024-06-18 DIAGNOSIS — Z961 Presence of intraocular lens: Secondary | ICD-10-CM | POA: Diagnosis present

## 2024-06-18 DIAGNOSIS — L03317 Cellulitis of buttock: Secondary | ICD-10-CM | POA: Diagnosis not present

## 2024-06-18 DIAGNOSIS — E876 Hypokalemia: Secondary | ICD-10-CM | POA: Diagnosis present

## 2024-06-18 DIAGNOSIS — M160 Bilateral primary osteoarthritis of hip: Principal | ICD-10-CM

## 2024-06-18 DIAGNOSIS — I10 Essential (primary) hypertension: Secondary | ICD-10-CM | POA: Diagnosis not present

## 2024-06-18 DIAGNOSIS — Z79890 Hormone replacement therapy (postmenopausal): Secondary | ICD-10-CM

## 2024-06-18 DIAGNOSIS — I96 Gangrene, not elsewhere classified: Secondary | ICD-10-CM | POA: Diagnosis not present

## 2024-06-18 DIAGNOSIS — Z885 Allergy status to narcotic agent status: Secondary | ICD-10-CM

## 2024-06-18 DIAGNOSIS — Z7189 Other specified counseling: Secondary | ICD-10-CM | POA: Diagnosis not present

## 2024-06-18 DIAGNOSIS — M009 Pyogenic arthritis, unspecified: Secondary | ICD-10-CM | POA: Diagnosis not present

## 2024-06-18 DIAGNOSIS — R6520 Severe sepsis without septic shock: Secondary | ICD-10-CM | POA: Diagnosis not present

## 2024-06-18 DIAGNOSIS — I700 Atherosclerosis of aorta: Secondary | ICD-10-CM | POA: Diagnosis not present

## 2024-06-18 DIAGNOSIS — E785 Hyperlipidemia, unspecified: Secondary | ICD-10-CM | POA: Diagnosis not present

## 2024-06-18 DIAGNOSIS — E871 Hypo-osmolality and hyponatremia: Secondary | ICD-10-CM | POA: Diagnosis not present

## 2024-06-18 DIAGNOSIS — G8929 Other chronic pain: Secondary | ICD-10-CM | POA: Diagnosis present

## 2024-06-18 DIAGNOSIS — G301 Alzheimer's disease with late onset: Secondary | ICD-10-CM | POA: Diagnosis not present

## 2024-06-18 DIAGNOSIS — N281 Cyst of kidney, acquired: Secondary | ICD-10-CM | POA: Diagnosis not present

## 2024-06-18 DIAGNOSIS — L97519 Non-pressure chronic ulcer of other part of right foot with unspecified severity: Secondary | ICD-10-CM | POA: Diagnosis not present

## 2024-06-18 DIAGNOSIS — M25474 Effusion, right foot: Secondary | ICD-10-CM | POA: Diagnosis not present

## 2024-06-18 DIAGNOSIS — Z955 Presence of coronary angioplasty implant and graft: Secondary | ICD-10-CM

## 2024-06-18 DIAGNOSIS — L97909 Non-pressure chronic ulcer of unspecified part of unspecified lower leg with unspecified severity: Secondary | ICD-10-CM | POA: Diagnosis not present

## 2024-06-18 DIAGNOSIS — G9341 Metabolic encephalopathy: Secondary | ICD-10-CM | POA: Diagnosis present

## 2024-06-18 DIAGNOSIS — M869 Osteomyelitis, unspecified: Secondary | ICD-10-CM | POA: Diagnosis present

## 2024-06-18 DIAGNOSIS — Z880 Allergy status to penicillin: Secondary | ICD-10-CM

## 2024-06-18 DIAGNOSIS — Z993 Dependence on wheelchair: Secondary | ICD-10-CM

## 2024-06-18 DIAGNOSIS — D649 Anemia, unspecified: Secondary | ICD-10-CM | POA: Diagnosis not present

## 2024-06-18 DIAGNOSIS — A419 Sepsis, unspecified organism: Secondary | ICD-10-CM | POA: Diagnosis not present

## 2024-06-18 DIAGNOSIS — Z5189 Encounter for other specified aftercare: Secondary | ICD-10-CM | POA: Diagnosis not present

## 2024-06-18 DIAGNOSIS — K5900 Constipation, unspecified: Secondary | ICD-10-CM | POA: Diagnosis present

## 2024-06-18 DIAGNOSIS — L89310 Pressure ulcer of right buttock, unstageable: Secondary | ICD-10-CM | POA: Diagnosis not present

## 2024-06-18 DIAGNOSIS — N179 Acute kidney failure, unspecified: Secondary | ICD-10-CM

## 2024-06-18 DIAGNOSIS — Z86718 Personal history of other venous thrombosis and embolism: Secondary | ICD-10-CM

## 2024-06-18 DIAGNOSIS — Z789 Other specified health status: Secondary | ICD-10-CM | POA: Diagnosis not present

## 2024-06-18 DIAGNOSIS — L89890 Pressure ulcer of other site, unstageable: Secondary | ICD-10-CM | POA: Diagnosis not present

## 2024-06-18 DIAGNOSIS — Z681 Body mass index (BMI) 19 or less, adult: Secondary | ICD-10-CM | POA: Diagnosis not present

## 2024-06-18 DIAGNOSIS — L02611 Cutaneous abscess of right foot: Secondary | ICD-10-CM | POA: Diagnosis not present

## 2024-06-18 LAB — COMPREHENSIVE METABOLIC PANEL WITH GFR
ALT: 20 U/L (ref 0–44)
AST: 39 U/L (ref 15–41)
Albumin: 2.3 g/dL — ABNORMAL LOW (ref 3.5–5.0)
Alkaline Phosphatase: 104 U/L (ref 38–126)
Anion gap: 11 (ref 5–15)
BUN: 39 mg/dL — ABNORMAL HIGH (ref 8–23)
CO2: 26 mmol/L (ref 22–32)
Calcium: 9 mg/dL (ref 8.9–10.3)
Chloride: 98 mmol/L (ref 98–111)
Creatinine, Ser: 1.15 mg/dL — ABNORMAL HIGH (ref 0.44–1.00)
GFR, Estimated: 45 mL/min — ABNORMAL LOW (ref >60)
Glucose, Bld: 158 mg/dL — ABNORMAL HIGH (ref 70–99)
Potassium: 3.4 mmol/L — ABNORMAL LOW (ref 3.5–5.1)
Sodium: 135 mmol/L (ref 135–145)
Total Bilirubin: 1 mg/dL (ref 0.0–1.2)
Total Protein: 7.4 g/dL (ref 6.5–8.1)

## 2024-06-18 LAB — I-STAT CG4 LACTIC ACID, ED
Lactic Acid, Venous: 1.9 mmol/L (ref 0.5–1.9)
Lactic Acid, Venous: 3.7 mmol/L (ref 0.5–1.9)

## 2024-06-18 LAB — CBC WITH DIFFERENTIAL/PLATELET
Abs Immature Granulocytes: 0.08 K/uL — ABNORMAL HIGH (ref 0.00–0.07)
Basophils Absolute: 0 K/uL (ref 0.0–0.1)
Basophils Relative: 0 %
Eosinophils Absolute: 0 K/uL (ref 0.0–0.5)
Eosinophils Relative: 0 %
HCT: 38.3 % (ref 36.0–46.0)
Hemoglobin: 12.5 g/dL (ref 12.0–15.0)
Immature Granulocytes: 1 %
Lymphocytes Relative: 5 %
Lymphs Abs: 0.7 K/uL (ref 0.7–4.0)
MCH: 28.3 pg (ref 26.0–34.0)
MCHC: 32.6 g/dL (ref 30.0–36.0)
MCV: 86.7 fL (ref 80.0–100.0)
Monocytes Absolute: 0.9 K/uL (ref 0.1–1.0)
Monocytes Relative: 7 %
Neutro Abs: 10.6 K/uL — ABNORMAL HIGH (ref 1.7–7.7)
Neutrophils Relative %: 87 %
Platelets: 440 K/uL — ABNORMAL HIGH (ref 150–400)
RBC: 4.42 MIL/uL (ref 3.87–5.11)
RDW: 13.3 % (ref 11.5–15.5)
WBC: 12.3 K/uL — ABNORMAL HIGH (ref 4.0–10.5)
nRBC: 0 % (ref 0.0–0.2)

## 2024-06-18 LAB — MAGNESIUM: Magnesium: 2.3 mg/dL (ref 1.7–2.4)

## 2024-06-18 MED ORDER — SODIUM CHLORIDE 0.9% FLUSH
3.0000 mL | Freq: Two times a day (BID) | INTRAVENOUS | Status: DC
  Administered 2024-06-18 – 2024-06-26 (×13): 3 mL via INTRAVENOUS

## 2024-06-18 MED ORDER — POTASSIUM CHLORIDE 10 MEQ/100ML IV SOLN
10.0000 meq | INTRAVENOUS | Status: AC
  Administered 2024-06-18 (×2): 10 meq via INTRAVENOUS
  Filled 2024-06-18 (×2): qty 100

## 2024-06-18 MED ORDER — SODIUM CHLORIDE 0.9 % IV SOLN
1.0000 g | Freq: Three times a day (TID) | INTRAVENOUS | Status: DC
  Administered 2024-06-18 – 2024-06-19 (×2): 1 g via INTRAVENOUS
  Filled 2024-06-18 (×5): qty 5

## 2024-06-18 MED ORDER — POLYETHYLENE GLYCOL 3350 17 G PO PACK: 17.0000 g | PACK | Freq: Every day | ORAL | Status: DC | PRN

## 2024-06-18 MED ORDER — LACTATED RINGERS IV BOLUS
500.0000 mL | Freq: Once | INTRAVENOUS | Status: AC
  Administered 2024-06-18: 500 mL via INTRAVENOUS

## 2024-06-18 MED ORDER — SODIUM CHLORIDE 0.9% FLUSH: 10.0000 mL | INTRAVENOUS | Status: DC | PRN

## 2024-06-18 MED ORDER — SORBITOL 70 % SOLN
30.0000 mL | Freq: Every day | Status: DC | PRN
  Administered 2024-06-20: 30 mL via ORAL
  Filled 2024-06-18: qty 30

## 2024-06-18 MED ORDER — FLEET ENEMA RE ENEM: 1.0000 | ENEMA | Freq: Once | RECTAL | Status: DC

## 2024-06-18 MED ORDER — ACETAMINOPHEN 325 MG PO TABS
650.0000 mg | ORAL_TABLET | Freq: Four times a day (QID) | ORAL | Status: DC | PRN
Start: 2024-06-18 — End: 2024-06-18

## 2024-06-18 MED ORDER — ACETAMINOPHEN 650 MG RE SUPP: 650.0000 mg | Freq: Four times a day (QID) | RECTAL | Status: DC | PRN

## 2024-06-18 MED ORDER — VANCOMYCIN HCL IN DEXTROSE 1-5 GM/200ML-% IV SOLN: 1000.0000 mg | INTRAVENOUS | Status: DC

## 2024-06-18 MED ORDER — ACETAMINOPHEN 500 MG PO TABS
1000.0000 mg | ORAL_TABLET | Freq: Four times a day (QID) | ORAL | Status: DC
  Administered 2024-06-20 – 2024-06-26 (×21): 1000 mg via ORAL
  Filled 2024-06-18 (×21): qty 2

## 2024-06-18 MED ORDER — LEVOTHYROXINE SODIUM 88 MCG PO TABS
88.0000 ug | ORAL_TABLET | Freq: Every day | ORAL | Status: DC
  Administered 2024-06-21 – 2024-06-30 (×8): 88 ug via ORAL
  Filled 2024-06-18 (×8): qty 1

## 2024-06-18 MED ORDER — FLEET ENEMA RE ENEM
1.0000 | ENEMA | Freq: Once | RECTAL | Status: AC | PRN
  Administered 2024-06-20: 1 via RECTAL
  Filled 2024-06-18: qty 1

## 2024-06-18 MED ORDER — POLYETHYLENE GLYCOL 3350 17 G PO PACK
17.0000 g | PACK | Freq: Two times a day (BID) | ORAL | Status: DC
  Administered 2024-06-20 – 2024-06-26 (×10): 17 g via ORAL
  Filled 2024-06-18 (×12): qty 1

## 2024-06-18 MED ORDER — LACTATED RINGERS IV BOLUS (SEPSIS)
500.0000 mL | Freq: Once | INTRAVENOUS | Status: AC
  Administered 2024-06-18: 500 mL via INTRAVENOUS

## 2024-06-18 MED ORDER — LACTATED RINGERS IV BOLUS (SEPSIS)
1000.0000 mL | Freq: Once | INTRAVENOUS | Status: AC
  Administered 2024-06-18: 1000 mL via INTRAVENOUS

## 2024-06-18 MED ORDER — VANCOMYCIN HCL IN DEXTROSE 1-5 GM/200ML-% IV SOLN: 1000.0000 mg | Freq: Once | INTRAVENOUS | Status: DC

## 2024-06-18 MED ORDER — LACTATED RINGERS IV BOLUS (SEPSIS)
250.0000 mL | Freq: Once | INTRAVENOUS | Status: AC
  Administered 2024-06-18: 250 mL via INTRAVENOUS

## 2024-06-18 MED ORDER — FENTANYL CITRATE PF 50 MCG/ML IJ SOSY
50.0000 ug | PREFILLED_SYRINGE | Freq: Once | INTRAMUSCULAR | Status: AC
  Administered 2024-06-18: 50 ug via INTRAVENOUS
  Filled 2024-06-18: qty 1

## 2024-06-18 MED ORDER — VANCOMYCIN HCL 1250 MG/250ML IV SOLN
1250.0000 mg | Freq: Once | INTRAVENOUS | Status: AC
  Administered 2024-06-18: 1250 mg via INTRAVENOUS
  Filled 2024-06-18: qty 250

## 2024-06-18 MED ORDER — SODIUM CHLORIDE 0.9 % IV SOLN
2.0000 g | Freq: Once | INTRAVENOUS | Status: AC
  Administered 2024-06-18: 2 g via INTRAVENOUS
  Filled 2024-06-18: qty 10

## 2024-06-18 MED ORDER — LACTATED RINGERS IV SOLN: INTRAVENOUS | Status: DC

## 2024-06-18 MED ORDER — DONEPEZIL HCL 10 MG PO TABS
5.0000 mg | ORAL_TABLET | Freq: Every day | ORAL | Status: DC
  Administered 2024-06-20 – 2024-06-25 (×6): 5 mg via ORAL
  Filled 2024-06-18 (×6): qty 1

## 2024-06-18 MED ORDER — IOHEXOL 350 MG/ML SOLN
75.0000 mL | Freq: Once | INTRAVENOUS | Status: AC | PRN
  Administered 2024-06-18: 75 mL via INTRAVENOUS

## 2024-06-18 NOTE — H&P (Addendum)
 History and Physical   Sabrina Hart FMW:[id number] DOB: 1931/12/27 DOA: 06/18/2024  PCP: Sabrina Clotilda [name], MD   Patient coming from: Home  Chief Complaint: Wound check, constipation, confusion  HPI: Sabrina Hart is a 89 y.o. female with medical history significant of hypertension, hyperlipidemia, hypothyroidism, CAD status post stent, dementia, glaucoma, DVT presenting with confusion, wound changes, constipation.  History obtained assistance of chart review and family due to patient's confusion and chronic dementia.  Patient has a chronic decubitus ulcer on her bilateral gluteal regions as well as her metatarsal regions.  She has not followed outpatient and wounds have been healing.  There has been some increased seepage after starting Eliquis  for DVT. Including bloody drainage.  There is also been some nosebleeding and some intermittent blood in the mouth in the morning possibly due to nosebleeding that is being swallowed/pooling in the mouth.  Family reports patient has been constipated without a bowel movement for the past 3 weeks.  They are concerned patient is becoming dehydrated as well exacerbating this.  Patient is wheelchair-bound at baseline.  Family also notes a decline in mentation and oral activity for the past several weeks as well.  Patient unable to accurately participate in review of systems due to confusion and baseline dementia.  ED Course: Vital signs in the ED notable for blood pressure in the 100s-120 systolic.  Lab workup included CMP with potassium 3.4, BUN 39, creatinine elevated to 1.15 from baseline of 0.8, glucose 158, albumin 2.3.  CBC with leukocytosis of 12.3, platelets 440.  Lactic acid normal on first check and then elevated to 3.7 on repeat.  Urinalysis and blood cultures pending.    CT abdomen pelvis showed crescentic abscess at the right hip measuring 12 x 2 x 12 cm, prominent stool favoring constipation, mesenteric edema unable to exclude small  ascites, severe arthropathy of the hips possible synovitis in the right hip.  Right kidney lesion favoring a cyst but is indeterminant MRI recommended for follow-up versus CT with and without contrast.  Calcified uterus.  Sensitivity lower due to patient in the left lateral decubitus.  Patient received vancomycin , aztreonam  in the ED in the setting of penicillin allergy.  Also received fentanyl  and 1.75 L of IV fluid.  Review of Systems: Patient unable to accurately participate in review of systems due to confusion and baseline dementia.  Past Medical History:  Diagnosis Date   Anginal pain [facility])    1990's; 09/28/2012   Arthritis    very mild (09/28/2012)   Coronary artery disease 09/27/2012   s/p PCI of the RCA with DES with remote PCI in [place] 15 to 20 years ago. 2. cath 02/2013 patent mid RCA stent, 30% left main, 70% mid to distaal tanden 70% lesions x 3, 60% mid diag, 50% ostial LCx, 40% mid LCx.    Glaucoma    Hyperlipidemia    Hypothyroidism     Past Surgical History:  Procedure Laterality Date   CARDIAC CATHETERIZATION  1990s   PCI   CATARACT EXTRACTION W/ INTRAOCULAR LENS  IMPLANT, BILATERAL  1970's   CORONARY ANGIOPLASTY WITH STENT PLACEMENT  09/28/2012   20-30% diffuse LAD stenosis, mild-mod D1 dz, 70% distal D1 lesion, mild diffuse LCx dz, 50-60% OM1 stenosis, 60% mid & 90% mid-dis RCA stenosis s/p DES; LVEF 75-80% w/ hyperdynamic fxn.    DENTAL SURGERY     implants   INNER EAR SURGERY  ~ 2010   put plugs in so I could fly (09/28/2012)  LEFT HEART CATHETERIZATION WITH CORONARY ANGIOGRAM N/A 09/28/2012   Procedure: LEFT HEART CATHETERIZATION WITH CORONARY ANGIOGRAM;  Surgeon: Aleene JINNY Passe, MD;  Location: [facility] CATH LAB;  Service: Cardiovascular;  Laterality: N/A;   LUMBAR DISC SURGERY  1980's   took out a little piece (09/28/2012)   PERCUTANEOUS CORONARY STENT INTERVENTION (PCI-S)  09/28/2012   Procedure: PERCUTANEOUS CORONARY STENT INTERVENTION (PCI-S);   Surgeon: Aleene JINNY Passe, MD;  Location: [facility] CATH LAB;  Service: Cardiovascular;;   REDUCTION MAMMAPLASTY  1970's    Social History  reports that she has never smoked. She has never used smokeless tobacco. She reports current alcohol use of about 1.0 standard drink of alcohol per week. She reports that she does not use drugs.  Allergies  Allergen Reactions   Other Other (See Comments)    Metals; skin gets weepy; gets worse if it makes contact (09/28/2012)   Pollen Extract Other (See Comments)    sneezing; watery eyes   Latex Itching and Swelling    Skin swells   Morphine And Codeine Rash   Nickel Other (See Comments)    Causes weepiness (skin)   Penicillins Rash    Has patient had a PCN reaction causing immediate rash, facial/tongue/throat swelling, SOB or lightheadedness with hypotension: Yes Has patient had a PCN reaction causing severe rash involving mucus membranes or skin necrosis: Unk Has patient had a PCN reaction that required hospitalization: No  Has patient had a PCN reaction occurring within the last 10 years: No If all of the above answers are NO, then may proceed with Cephalosporin use.    History reviewed. No pertinent family history.  Prior to Admission medications   Medication Sig Start Date End Date Taking? Authorizing Provider  amLODipine  (NORVASC ) 5 MG tablet Take 1 tablet (5 mg total) by mouth daily. 01/11/24   Sabrina Clotilda [name], MD  apixaban  (ELIQUIS ) 5 MG TABS tablet Take 1 tablet (5 mg total) by mouth 2 (two) times daily. 05/25/24   Barbarann Dixon B, RPH-CPP  APIXABAN  (ELIQUIS ) VTE STARTER PACK (10MG  AND 5MG ) Take as directed on package: start with two-5mg  tablets twice daily for 7 days. On day 8, switch to one-5mg  tablet twice daily. 05/25/24   Barbarann Dixon B, RPH-CPP  atorvastatin  (LIPITOR) 40 MG tablet Take 1 tablet (40 mg total) by mouth daily. Patient not taking: Reported on 05/02/2024 02/27/21   Sabrina Clotilda [name], MD  donepezil  (ARICEPT ) 5 MG tablet Take  1 tablet (5 mg total) by mouth at bedtime. 05/07/19   Sabrina Clotilda [name], MD  Incontinence Supply Disposable (PROCARE ADULT BRIEFS X-LARGE) MISC Use as directed. 03/09/23   Sabrina Clotilda [name], MD  latanoprost (XALATAN) 0.005 % ophthalmic solution Place 1 drop into both eyes at bedtime. 02/24/23   [provider]  levothyroxine  (SYNTHROID ) 88 MCG tablet Take 1 tablet (88 mcg total) by mouth every morning. 01/11/24   Sabrina Clotilda [name], MD  LORazepam  (ATIVAN ) 0.5 MG tablet TAKE 1 TABLET BY MOUTH AT BEDTIME AS NEEDED FOR ANXIETY. Patient not taking: Reported on 05/02/2024 06/27/23   Sabrina Clotilda [name], MD  magnesium oxide (MAG-OX) 400 (240 Mg) MG tablet Take 400 mg by mouth daily.    [provider]  risperiDONE  (RISPERDAL ) 0.5 MG tablet TAKE 1 TABLET BY MOUTH 2 TIMES DAILY. 04/30/24   Sabrina Clotilda [name], MD  sertraline  (ZOLOFT ) 25 MG tablet Take 1 tablet (25 mg total) by mouth daily. 01/11/24   Sabrina Clotilda [name], MD  SIMBRINZA 1-0.2 % SUSP Apply to  eye. 12/27/22   [provider]    Physical Exam: Vitals:   06/18/24 0930 06/18/24 0936 06/18/24 1100 06/18/24 1154  BP: (!) 104/55  (!) 125/51 (!) 126/59  Pulse:   81   Resp:   15 14  Temp:      SpO2:   100%   Weight:  52.2 kg    Height:  5' 3 (1.6 m)      Physical Exam Constitutional:      General: She is not in acute distress.    Appearance: She is ill-appearing.  HENT:     Head: Normocephalic and atraumatic.     Mouth/Throat:     Mouth: Mucous membranes are moist.     Pharynx: Oropharynx is clear.  Eyes:     Extraocular Movements: Extraocular movements intact.     Pupils: Pupils are equal, round, and reactive to light.  Cardiovascular:     Rate and Rhythm: Normal rate and regular rhythm.     Pulses: Normal pulses.     Heart sounds: Normal heart sounds.  Pulmonary:     Effort: Pulmonary effort is normal. No respiratory distress.     Breath sounds: Normal breath sounds.  Abdominal:     General: Bowel sounds are normal.  There is no distension.     Palpations: Abdomen is soft.     Tenderness: There is no abdominal tenderness.  Musculoskeletal:        General: No swelling or deformity.     Comments: Bilateral wounds to gluteal regions and heels.  Serosanguineous drainage.  Malodorous wound of the right hip.  Skin:    General: Skin is warm and dry.  Neurological:     General: No focal deficit present.     Mental Status: She is disoriented.            Labs on Admission: I have personally reviewed following labs and imaging studies  CBC: Recent Labs  Lab 06/18/24 0940  WBC 12.3*  NEUTROABS 10.6*  HGB 12.5  HCT 38.3  MCV 86.7  PLT 440*    Basic Metabolic Panel: Recent Labs  Lab 06/18/24 0940  NA 135  K 3.4*  CL 98  CO2 26  GLUCOSE 158*  BUN 39*  CREATININE 1.15*  CALCIUM  9.0    GFR: Estimated Creatinine Clearance: 26.8 mL/min (A) (by C-G formula based on SCr of 1.15 mg/dL (H)).  Liver Function Tests: Recent Labs  Lab 06/18/24 0940  AST 39  ALT 20  ALKPHOS 104  BILITOT 1.0  PROT 7.4  ALBUMIN 2.3*    Urine analysis:    Component Value Date/Time   COLORURINE YELLOW 10/03/2018 2050   APPEARANCEUR CLEAR 10/03/2018 2050   LABSPEC 1.012 10/03/2018 2050   PHURINE 7.0 10/03/2018 2050   GLUCOSEU NEGATIVE 10/03/2018 2050   HGBUR SMALL (A) 10/03/2018 2050   BILIRUBINUR neg 04/06/2023 1443   KETONESUR 5 (A) 10/03/2018 2050   PROTEINUR Negative 04/06/2023 1443   PROTEINUR NEGATIVE 10/03/2018 2050   UROBILINOGEN negative (A) 04/06/2023 1443   NITRITE neg 04/06/2023 1443   NITRITE NEGATIVE 10/03/2018 2050   LEUKOCYTESUR Large (3+) (A) 04/06/2023 1443    Radiological Exams on Admission: No results found.  EKG: Not obtained in the emergency department  Assessment/Plan Active Problems:   CAD (coronary artery disease)   Hypothyroidism   Hyperlipidemia   Severe late onset Alzheimer's dementia (HCC)   Essential hypertension   Primary open angle glaucoma (POAG) of  both eyes   SIRS  Decubitus ulcers Abscess ?Synovitis ?R/O Osteromyelitis Encephalopathy > Patient with inflammatory response and elevated WBC to 12.3.  Also noted to have elevated lactic acid and confusion. > Out of concern for infectious etiology of patient's symptoms she has been started on vancomycin  and aztreonam  in the ED.  This is due to penicillin allergy. > Likely sources include decubitus ulcers which have increased drainage.  Will need to rule out  UTI with urinalysis. > WBC 12.3.  Lactic acid 1.9, 3.7, repeat pending to confirm elevation. > CT abdomen pelvis showed crescentic abscess at the right hip measuring 12 x 2 x 12 cm, prominent stool favoring constipation, mesenteric edema unable to exclude small ascites, severe arthropathy of the hips possible synovitis in the right hip.  Right kidney lesion favoring a cyst but is indeterminant MRI recommended for follow-up versus CT with and without contrast.  Calcified uterus.  Sensitivity lower due to patient in the left lateral decubitus. > Certainly has abscess at the right hip and concern for synovitis.  Lower extremity wounds will need to be investigated for possible deeper infection. > Family states that last dose of Eliquis  was 3 days ago on 7/18 due to her recurrent bleeding. - Monitor in progressive unit - Orthopedic Surgery consulted by EDP, appreciate recommendations and assistance. - Continue with vancomycin  and aztreonam  for now - Orthopedic surgery plan for MRI of right foot and are discussing operative intervention for right hip. - Trend fever curve and WBC - Follow-up U/A, blood culture - IV fluids - Trend lactic acid - Pain medication as needed.  Will start with scheduled Tylenol , want to avoid further constipation.  Constipation > Patient did have significant constipation and has not had a bowel movement for 3 weeks per family.  This is despite trial of enema at home. > CT abdomen pelvis showed crescentic abscess at  the right hip measuring 12 x 2 x 12 cm, prominent stool favoring constipation, mesenteric edema unable to exclude small ascites, severe arthropathy of the hips possible synovitis in the right hip.  Right kidney lesion favoring a cyst but is indeterminant MRI recommended for follow-up versus CT with and without contrast.  Calcified uterus.  Sensitivity lower due to patient in the left lateral decubitus. - Aggressive bowel regimen - Avoid opioids as able  AKI Hypokalemia > Patient presenting with decreased p.o. intake.  Concern for dehydration/constipation as above. > Creatinine elevated to 1.15 which is above her baseline around 0.8.  Potassium 3.4. > Received 1.75 L IV fluid in the ED. - Continue IV fluids overnight - 20 mEq IV. potassium - Check magnesium  - Trend renal function and electrolytes  Hypertension - Holding home amlodipine  in the setting of above  Hyperlipidemia - Atorvastatin  held outpatient  CAD > Status post stents - Continue home atorvastatin , Eliquis   Hypothyroidism - Continue home Synthroid   Glaucoma - Continue eyedrops once confirmed  History of DVT - Holding Eliquis  for now pending surgical evaluation.  Will likely resume after if okay with surgery colleagues.  Dementia - Continue home donepezil , risperidone , Ativan , sertraline  once confirmed  DVT prophylaxis: Eliquis  at home, SCDs for now pending surgical evaluation Code Status:   DNR/DNI, other interventions okay Family Communication:  Updated at bedside Disposition Plan:   Patient is from:  Home  Anticipated DC to:  Pending clinical course  Anticipated DC date:  2 to 5 days  Anticipated DC barriers: None  Consults called:  Orthopedic surgery  Admission status:  Inpatient, progressive  Severity of Illness:  The appropriate patient status for this patient is INPATIENT. Inpatient status is judged to be reasonable and necessary in order to provide the required intensity of service to ensure the  patient's safety. The patient's presenting symptoms, physical exam findings, and initial radiographic and laboratory data in the context of their chronic comorbidities is felt to place them at high risk for further clinical deterioration. Furthermore, it is not anticipated that the patient will be medically stable for discharge from the hospital within 2 midnights of admission.   * I certify that at the point of admission it is my clinical judgment that the patient will require inpatient hospital care spanning beyond 2 midnights from the point of admission due to high intensity of service, high risk for further deterioration and high frequency of surveillance required.DEWAINE Marsa KATHEE Seena MD [facility]  How to contact the [facility] Attending or Consulting provider 7A - 7P or covering provider during after hours 7P -7A, for this patient?   Check the care team in [facility] and look for a) attending/consulting [facility] provider listed and b) the [facility] team listed Log into [email or web address] and use [place]'s universal password to access. If you do not have the password, please contact the hospital operator. Locate the [facility] provider you are looking for under [facility] and page to a number that you can be directly reached. If you still have difficulty reaching the provider, please page the [facility] (Director on Call) for the Hospitalists listed on amion for assistance.  06/18/2024, 12:46 PM

## 2024-06-18 NOTE — Progress Notes (Addendum)
 Pharmacy Antibiotic Note  Sabrina Hart is a 89 y.o. female admitted on 06/18/2024 with wound infection.  Pharmacy has been consulted for vancomycin  and aztreonam  dosing.  Plan: - initiate vancomycin  1000mg  IV Q48h - est auc 490 - pt received vancomycin  1250mg  IV LD 7/21 @ ~1200 - note that pt is in AKI - baseline Scr seems to be around 0.5-0.6 - initiate aztreonam  1g q8h - monitor WBC and s/sx of infection resolution - monitor Scr for worsening of renal function  Height: 5' 3 (160 cm) Weight: 52.2 kg (115 lb) IBW/kg (Calculated) : 52.4  Temp (24hrs), Avg:97.5 F (36.4 C), Min:97.5 F (36.4 C), Max:97.5 F (36.4 C)  Recent Labs  Lab 06/18/24 0940 06/18/24 0950 06/18/24 1141  WBC 12.3*  --   --   CREATININE 1.15*  --   --   LATICACIDVEN  --  1.9 3.7*    Estimated Creatinine Clearance: 26.8 mL/min (A) (by C-G formula based on SCr of 1.15 mg/dL (H)).    Allergies  Allergen Reactions   Other Other (See Comments)    Metals; skin gets weepy; gets worse if it makes contact (09/28/2012)   Pollen Extract Other (See Comments)    sneezing; watery eyes   Latex Itching and Swelling    Skin swells   Morphine And Codeine Rash   Nickel Other (See Comments)    Causes weepiness (skin)   Penicillins Rash    Has patient had a PCN reaction causing immediate rash, facial/tongue/throat swelling, SOB or lightheadedness with hypotension: Yes Has patient had a PCN reaction causing severe rash involving mucus membranes or skin necrosis: Unk Has patient had a PCN reaction that required hospitalization: No  Has patient had a PCN reaction occurring within the last 10 years: No If all of the above answers are NO, then may proceed with Cephalosporin use.     Antimicrobials this admission: Aztreonam  7/21 >> Vancomycin  7/21 >>  Dose adjustments this admission: N/a  Microbiology results: 7/21 BCx: in process    Thank you for allowing pharmacy to be a part of this patient's  care.   Belvie Macintosh, PharmD Candidate 06/18/2024 2:23 PM

## 2024-06-18 NOTE — ED Triage Notes (Signed)
 Family states patient was started on Eliquis  21 days ago , states she has wounds on both hips and her left foot, last 3 days she is bleeding from wounds, decreased appetite , states she was started on Eliquis  for DVT right leg.

## 2024-06-18 NOTE — Consult Note (Signed)
 Reason for Consult:RLE ulcerations Referring Physician: [name] Time called: 1352 Time at bedside: [address] [name] is an 89 y.o. female.  HPI: Sabrina Hart was brought to the ED by family with multiple concerns. One of them is multiple decubitus ulcers. They have been dealing with these at home with the direction of her PCP but had to start Eliquis  about a month ago and things have gone downhill very quickly. She is demented and cannot contribute to history but family thinks the hip ulcer is causing her significant discomfort.   Past Medical History:  Diagnosis Date   Anginal pain [facility])    1990's; 09/28/2012   Arthritis    very mild (09/28/2012)   Coronary artery disease 09/27/2012   s/p PCI of the RCA with DES with remote PCI in [place] 15 to 20 years ago. 2. cath 02/2013 patent mid RCA stent, 30% left main, 70% mid to distaal tanden 70% lesions x 3, 60% mid diag, 50% ostial LCx, 40% mid LCx.    Glaucoma    Hyperlipidemia    Hypothyroidism     Past Surgical History:  Procedure Laterality Date   CARDIAC CATHETERIZATION  1990s   PCI   CATARACT EXTRACTION W/ INTRAOCULAR LENS  IMPLANT, BILATERAL  1970's   CORONARY ANGIOPLASTY WITH STENT PLACEMENT  09/28/2012   20-30% diffuse LAD stenosis, mild-mod D1 dz, 70% distal D1 lesion, mild diffuse LCx dz, 50-60% OM1 stenosis, 60% mid & 90% mid-dis RCA stenosis s/p DES; LVEF 75-80% w/ hyperdynamic fxn.    DENTAL SURGERY     implants   INNER EAR SURGERY  ~ 2010   put plugs in so I could fly (09/28/2012)   LEFT HEART CATHETERIZATION WITH CORONARY ANGIOGRAM N/A 09/28/2012   Procedure: LEFT HEART CATHETERIZATION WITH CORONARY ANGIOGRAM;  Surgeon: Aleene JINNY Passe, MD;  Location: [facility] CATH LAB;  Service: Cardiovascular;  Laterality: N/A;   LUMBAR DISC SURGERY  1980's   took out a little piece (09/28/2012)   PERCUTANEOUS CORONARY STENT INTERVENTION (PCI-S)  09/28/2012   Procedure: PERCUTANEOUS CORONARY STENT INTERVENTION  (PCI-S);  Surgeon: Aleene JINNY Passe, MD;  Location: [facility] CATH LAB;  Service: Cardiovascular;;   REDUCTION MAMMAPLASTY  1970's    History reviewed. No pertinent family history.  Social History:  reports that she has never smoked. She has never used smokeless tobacco. She reports current alcohol use of about 1.0 standard drink of alcohol per week. She reports that she does not use drugs.  Allergies:  Allergies  Allergen Reactions   Other Other (See Comments)    Metals; skin gets weepy; gets worse if it makes contact (09/28/2012)   Pollen Extract Other (See Comments)    sneezing; watery eyes   Latex Itching and Swelling    Skin swells   Morphine And Codeine Rash   Nickel Other (See Comments)    Causes weepiness (skin)   Penicillins Rash    Has patient had a PCN reaction causing immediate rash, facial/tongue/throat swelling, SOB or lightheadedness with hypotension: Yes Has patient had a PCN reaction causing severe rash involving mucus membranes or skin necrosis: Unk Has patient had a PCN reaction that required hospitalization: No  Has patient had a PCN reaction occurring within the last 10 years: No If all of the above answers are NO, then may proceed with Cephalosporin use.     Medications: I have reviewed the patient's current medications.  Results for orders placed or performed during the hospital encounter of 06/18/24 (from the past  48 hours)  Comprehensive metabolic panel     Status: Abnormal   Collection Time: 06/18/24  9:40 AM  Result Value Ref Range   Sodium 135 135 - 145 mmol/L   Potassium 3.4 (L) 3.5 - 5.1 mmol/L   Chloride 98 98 - 111 mmol/L   CO2 26 22 - 32 mmol/L   Glucose, Bld 158 (H) 70 - 99 mg/dL    Comment: Glucose reference range applies only to samples taken after fasting for at least 8 hours.   BUN 39 (H) 8 - 23 mg/dL   Creatinine, Ser 8.84 (H) 0.44 - 1.00 mg/dL   Calcium  9.0 8.9 - 10.3 mg/dL   Total Protein 7.4 6.5 - 8.1 g/dL   Albumin 2.3 (L) 3.5 -  5.0 g/dL   AST 39 15 - 41 U/L   ALT 20 0 - 44 U/L   Alkaline Phosphatase 104 38 - 126 U/L   Total Bilirubin 1.0 0.0 - 1.2 mg/dL   GFR, Estimated 45 (L) >60 mL/min    Comment: (NOTE) Calculated using the CKD-EPI Creatinine Equation (2021)    Anion gap 11 5 - 15    Comment: Performed at [facility], [address], [place], [place] [address]  CBC with Differential     Status: Abnormal   Collection Time: 06/18/24  9:40 AM  Result Value Ref Range   WBC 12.3 (H) 4.0 - 10.5 K/uL   RBC 4.42 3.87 - 5.11 MIL/uL   Hemoglobin 12.5 12.0 - 15.0 g/dL   HCT 61.6 63.9 - 53.9 %   MCV 86.7 80.0 - 100.0 fL   MCH 28.3 26.0 - 34.0 pg   MCHC 32.6 30.0 - 36.0 g/dL   RDW 86.6 88.4 - 84.4 %   Platelets 440 (H) 150 - 400 K/uL   nRBC 0.0 0.0 - 0.2 %   Neutrophils Relative % 87 %   Neutro Abs 10.6 (H) 1.7 - 7.7 K/uL   Lymphocytes Relative 5 %   Lymphs Abs 0.7 0.7 - 4.0 K/uL   Monocytes Relative 7 %   Monocytes Absolute 0.9 0.1 - 1.0 K/uL   Eosinophils Relative 0 %   Eosinophils Absolute 0.0 0.0 - 0.5 K/uL   Basophils Relative 0 %   Basophils Absolute 0.0 0.0 - 0.1 K/uL   Immature Granulocytes 1 %   Abs Immature Granulocytes 0.08 (H) 0.00 - 0.07 K/uL    Comment: Performed at [facility], [address] [place], [place] [address]  Magnesium     Status: None   Collection Time: 06/18/24  9:40 AM  Result Value Ref Range   Magnesium 2.3 1.7 - 2.4 mg/dL    Comment: Performed at [facility], [address] [place], [place] [address]  I-Stat Lactic Acid, ED     Status: None   Collection Time: 06/18/24  9:50 AM  Result Value Ref Range   Lactic Acid, Venous 1.9 0.5 - 1.9 mmol/L  I-Stat Lactic Acid, ED     Status: Abnormal   Collection Time: 06/18/24 11:41 AM  Result Value Ref Range   Lactic Acid, Venous 3.7 (HH) 0.5 - 1.9 mmol/L   Comment NOTIFIED PHYSICIAN     CT ABDOMEN PELVIS W CONTRAST Result Date: 06/18/2024 CLINICAL DATA:  Bowel obstruction EXAM: CT ABDOMEN AND PELVIS WITH  CONTRAST TECHNIQUE: Multidetector CT imaging of the abdomen and pelvis was performed using the standard protocol following bolus administration of intravenous contrast. RADIATION DOSE REDUCTION: This exam was  performed according to the departmental dose-optimization program which includes automated exposure control, adjustment of the mA and/or kV according to patient size and/or use of iterative reconstruction technique. CONTRAST:  75mL OMNIPAQUE  IOHEXOL  350 MG/ML SOLN COMPARISON:  None Available. FINDINGS: The patient was imaged in a contracted position laying on her left side, resulting in some shifting structures. 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 Descending thoracic aortic and right coronary artery atherosclerosis. The left lung base is excluded. Hepatobiliary: Unremarkable Pancreas: Unremarkable Spleen: Unremarkable where included Adrenals/Urinary Tract: 2.2 cm homogeneous right kidney lower pole lesion favoring a cyst but with internal density of 44 Hounsfield units on portal venous phase images. This is technically indeterminate for cyst versus mass. In most clinical circumstances, renal protocol MRI or CT with and without contrast would be recommended for follow up of this lesion, although correlation with the patient's overall clinical scenario is recommended. Adrenal glands unremarkable. No hydronephrosis or hydroureter. Urinary bladder unremarkable. Stomach/Bowel: Prominent stool throughout the colon favors constipation. No definite dilated small bowel loops are identified, although bowel loops are relatively indistinct in portions of the abdomen. Vascular/Lymphatic: Atherosclerosis is present, including aortoiliac atherosclerotic disease. Substantial atheromatous vascular plaque at the origin of the celiac trunk and SMA contributing to potentially high-grade stenosis although both vessels appear to opacify and accordingly are likely  not completely occluded. There is additional plaque further distally in the SMA. Reproductive: Calcified uterine fibroids. Other: Mesenteric edema. Difficult to exclude a small amount of ascites. Musculoskeletal: Suspected abscess in the subcutaneous tissues lateral to the right hip measuring about 12.0 by 2.0 by 12.0 cm (volume = 150 cm^3). Subcutaneous edema in the right proximal thigh posteriorly Severe arthropathy of the hips, right greater than left, with volume loss in the femoral heads. Flattening and spurring of the right acetabulum with potential erosions. Possible synovitis in the right hip joint. Degenerative arthropathy of the right elbow which is included in the field of imaging. IMPRESSION: 1. Crescentic abscess in the subcutaneous tissues lateral to the right hip measuring about 12.0 by 2.0 by 12.0 cm (volume = 150 cc). 2. Prominent stool throughout the colon favors constipation. 3. Mesenteric edema. Difficult to exclude a small amount of ascites. 4. Severe arthropathy of the hips, right greater than left, with volume loss in the femoral heads. Flattening and spurring of the right acetabulum with potential erosions. Possible synovitis in the right hip joint. 5. 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 6. 2.2 cm homogeneous right kidney lower pole lesion favoring a cyst but with internal density of 44 Hounsfield units on portal venous phase images. This is technically indeterminate for cyst versus mass. In most clinical circumstances, renal protocol MRI or CT with and without contrast would be recommended for follow up of this lesion, although correlation with the patient's overall clinical scenario is recommended. 7. Calcified uterine fibroids. 8. Aortic Atherosclerosis (ICD10-I70.0). Notable atherosclerosis proximally in the celiac trunk and SMA with substantial stenosis although no overt occlusion. 9. Reduced diagnostic sensitivity and specificity  related to patient's contracted state and left side down lateral decubitus positioning during imaging. Electronically Signed   By: Ryan Salvage M.D.   On: 06/18/2024 13:07    Review of Systems  Unable to perform ROS: Dementia   Blood pressure (!) 126/59, pulse 81, temperature (!) 97.5 F (36.4 C), resp. rate 14, height 5' 3 (1.6 m), weight 52.2 kg, SpO2 100%. Physical Exam Constitutional:  General: She is not in acute distress.    Appearance: She is well-developed. She is not diaphoretic.  HENT:     Head: Normocephalic and atraumatic.  Eyes:     General: No scleral icterus.       Right eye: No discharge.        Left eye: No discharge.     Conjunctiva/sclera: Conjunctivae normal.  Cardiovascular:     Rate and Rhythm: Normal rate and regular rhythm.  Pulmonary:     Effort: Pulmonary effort is normal. No respiratory distress.  Musculoskeletal:     Cervical back: Normal range of motion.     Comments: RLE No traumatic wounds, ecchymosis, or rash  Large ulceration right hip with necrotic center and underlying fluctuance. Necrotic ulceration medial 1st MTP joint with active purulence, odor. Multiple contractures.  No knee or ankle effusion  Knee stable to varus/ valgus and anterior/posterior stress  Sens DPN, SPN, TN could not assess  Motor EHL, ext, flex, evers could not assess  DP 1?, PT 0, No significant edema  Skin:    General: Skin is warm and dry.  Neurological:     Mental Status: She is alert.  Psychiatric:        Mood and Affect: Mood normal.        Behavior: Behavior normal.     Assessment/Plan: RLE ulcerations -- Dr. Harden to evaluate later today or in AM. Suspect will need operative I&D for the hip wound which family is in favor of. Will get ABI and MRI of the foot though they are leaning against amy sort of amputation.    Ozell DOROTHA Ned, PA-C Orthopedic Surgery [phone] 06/18/2024, 2:25 PM

## 2024-06-18 NOTE — Consult Note (Signed)
 ORTHOPAEDIC CONSULTATION  REQUESTING PHYSICIAN: Seena Marsa NOVAK, MD  Chief Complaint: Right hip decubitus ulcer with gangrenous changes to the right foot.  HPI: Sabrina Hart is a 89 y.o. female who presents with ischemic right hip decubitus ulcer with ischemic changes to the right foot.  Patient's family and caregiver states this started after patient was started on Eliquis .  Patient is nonambulatory.  The family and caregiver are able to mobilize the patient from the bed to a chair.  Patient has history of fixed flexion contractures in her extremities.  Past Medical History:  Diagnosis Date   Anginal pain [facility])    1990's; 09/28/2012   Arthritis    very mild (09/28/2012)   Coronary artery disease 09/27/2012   s/p PCI of the RCA with DES with remote PCI in [place] 15 to 20 years ago. 2. cath 02/2013 patent mid RCA stent, 30% left main, 70% mid to distaal tanden 70% lesions x 3, 60% mid diag, 50% ostial LCx, 40% mid LCx.    Glaucoma    Hyperlipidemia    Hypothyroidism    Past Surgical History:  Procedure Laterality Date   CARDIAC CATHETERIZATION  1990s   PCI   CATARACT EXTRACTION W/ INTRAOCULAR LENS  IMPLANT, BILATERAL  1970's   CORONARY ANGIOPLASTY WITH STENT PLACEMENT  09/28/2012   20-30% diffuse LAD stenosis, mild-mod D1 dz, 70% distal D1 lesion, mild diffuse LCx dz, 50-60% OM1 stenosis, 60% mid & 90% mid-dis RCA stenosis s/p DES; LVEF 75-80% w/ hyperdynamic fxn.    DENTAL SURGERY     implants   INNER EAR SURGERY  ~ 2010   put plugs in so I could fly (09/28/2012)   LEFT HEART CATHETERIZATION WITH CORONARY ANGIOGRAM N/A 09/28/2012   Procedure: LEFT HEART CATHETERIZATION WITH CORONARY ANGIOGRAM;  Surgeon: Aleene JINNY Passe, MD;  Location: [facility] CATH LAB;  Service: Cardiovascular;  Laterality: N/A;   LUMBAR DISC SURGERY  1980's   took out a little piece (09/28/2012)   PERCUTANEOUS CORONARY STENT INTERVENTION (PCI-S)  09/28/2012   Procedure: PERCUTANEOUS  CORONARY STENT INTERVENTION (PCI-S);  Surgeon: Aleene JINNY Passe, MD;  Location: [facility] CATH LAB;  Service: Cardiovascular;;   REDUCTION MAMMAPLASTY  1970's   Social History   Socioeconomic History   Marital status: Single    Spouse name: Not on file   Number of children: Not on file   Years of education: Not on file   Highest education level: Not on file  Occupational History   Not on file  Tobacco Use   Smoking status: Never   Smokeless tobacco: Never  Vaping Use   Vaping status: Never Used  Substance and Sexual Activity   Alcohol use: Yes    Alcohol/week: 1.0 standard drink of alcohol    Types: 1 Glasses of wine per week    Comment: 09/28/2012 1 glass of wine averages once/wk; beer average once/week during summer w/friends   Drug use: No   Sexual activity: Never  Other Topics Concern   Not on file  Social History Narrative   Not on file   Social Drivers of Health   Financial Resource Strain: Not on file  Food Insecurity: Not on file  Transportation Needs: Not on file  Physical Activity: Not on file  Stress: Not on file  Social Connections: Not on file   History reviewed. No pertinent family history. - negative except otherwise stated in the family history section Allergies  Allergen Reactions   Other Other (See Comments)  Metals; skin gets weepy; gets worse if it makes contact (09/28/2012)   Pollen Extract Other (See Comments)    sneezing; watery eyes   Latex Itching, Swelling and Other (See Comments)    Skin swells   Morphine And Codeine Rash and Other (See Comments)    Patient does not prefer to take these, but if absolutely necessary, accommodations can be made.   Nickel Other (See Comments)    Causes weepiness (skin)   Penicillins Rash    Has patient had a PCN reaction causing immediate rash, facial/tongue/throat swelling, SOB or lightheadedness with hypotension: Yes Has patient had a PCN reaction causing severe rash involving mucus membranes or skin  necrosis: Unk Has patient had a PCN reaction that required hospitalization: No  Has patient had a PCN reaction occurring within the last 10 years: No If all of the above answers are NO, then may proceed with Cephalosporin use.    Prior to Admission medications   Medication Sig Start Date End Date Taking? Authorizing Provider  amLODipine  (NORVASC ) 5 MG tablet Take 1 tablet (5 mg total) by mouth daily. 01/11/24  Yes [name], MD  apixaban  (ELIQUIS ) 5 MG TABS tablet Take 1 tablet (5 mg total) by mouth 2 (two) times daily. Patient taking differently: Take 5 mg by mouth in the morning. 05/25/24  Yes Yates, Madison B, RPH-CPP  levothyroxine  (SYNTHROID ) 88 MCG tablet Take 1 tablet (88 mcg total) by mouth every morning. 01/11/24  Yes [name], MD  magnesium oxide (MAG-OX) 400 (240 Mg) MG tablet Take 400 mg by mouth daily.   Yes [provider]  risperiDONE  (RISPERDAL ) 0.5 MG tablet TAKE 1 TABLET BY MOUTH 2 TIMES DAILY. 04/30/24  Yes [name], MD  sertraline  (ZOLOFT ) 25 MG tablet Take 1 tablet (25 mg total) by mouth daily. 01/11/24  Yes [name], MD  SIMBRINZA 1-0.2 % SUSP Place 1 drop into the left eye in the morning and at bedtime. 12/27/22  Yes [provider]  APIXABAN  (ELIQUIS ) VTE STARTER PACK (10MG  AND 5MG ) Take as directed on package: start with two-5mg  tablets twice daily for 7 days. On day 8, switch to one-5mg  tablet twice daily. Patient not taking: Reported on 06/18/2024 05/25/24   Barbarann Dixon B, RPH-CPP  atorvastatin  (LIPITOR) 40 MG tablet Take 1 tablet (40 mg total) by mouth daily. Patient not taking: Reported on 05/02/2024 02/27/21   [name], MD  donepezil  (ARICEPT ) 5 MG tablet Take 1 tablet (5 mg total) by mouth at bedtime. Patient not taking: Reported on 06/18/2024 05/07/19   [name], MD  Incontinence Supply Disposable (PROCARE ADULT BRIEFS X-LARGE) MISC Use as directed. 03/09/23   [name], MD  LORazepam  (ATIVAN ) 0.5 MG  tablet TAKE 1 TABLET BY MOUTH AT BEDTIME AS NEEDED FOR ANXIETY. Patient not taking: Reported on 05/02/2024 06/27/23   [name], MD   CT ABDOMEN PELVIS W CONTRAST Result Date: 06/18/2024 CLINICAL DATA:  Bowel obstruction EXAM: CT ABDOMEN AND PELVIS WITH CONTRAST TECHNIQUE: Multidetector CT imaging of the abdomen and pelvis was performed using the standard protocol following bolus administration of intravenous contrast. RADIATION DOSE REDUCTION: This exam was performed according to the departmental dose-optimization program which includes automated exposure control, adjustment of the mA and/or kV according to patient size and/or use of iterative reconstruction technique. CONTRAST:  75mL OMNIPAQUE  IOHEXOL  350 MG/ML SOLN COMPARISON:  None Available. FINDINGS: The patient was imaged in a contracted position laying on her left side,  resulting in some shifting structures. 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 Descending thoracic aortic and right coronary artery atherosclerosis. The left lung base is excluded. Hepatobiliary: Unremarkable Pancreas: Unremarkable Spleen: Unremarkable where included Adrenals/Urinary Tract: 2.2 cm homogeneous right kidney lower pole lesion favoring a cyst but with internal density of 44 Hounsfield units on portal venous phase images. This is technically indeterminate for cyst versus mass. In most clinical circumstances, renal protocol MRI or CT with and without contrast would be recommended for follow up of this lesion, although correlation with the patient's overall clinical scenario is recommended. Adrenal glands unremarkable. No hydronephrosis or hydroureter. Urinary bladder unremarkable. Stomach/Bowel: Prominent stool throughout the colon favors constipation. No definite dilated small bowel loops are identified, although bowel loops are relatively indistinct in portions of the abdomen. Vascular/Lymphatic:  Atherosclerosis is present, including aortoiliac atherosclerotic disease. Substantial atheromatous vascular plaque at the origin of the celiac trunk and SMA contributing to potentially high-grade stenosis although both vessels appear to opacify and accordingly are likely not completely occluded. There is additional plaque further distally in the SMA. Reproductive: Calcified uterine fibroids. Other: Mesenteric edema. Difficult to exclude a small amount of ascites. Musculoskeletal: Suspected abscess in the subcutaneous tissues lateral to the right hip measuring about 12.0 by 2.0 by 12.0 cm (volume = 150 cm^3). Subcutaneous edema in the right proximal thigh posteriorly Severe arthropathy of the hips, right greater than left, with volume loss in the femoral heads. Flattening and spurring of the right acetabulum with potential erosions. Possible synovitis in the right hip joint. Degenerative arthropathy of the right elbow which is included in the field of imaging. IMPRESSION: 1. Crescentic abscess in the subcutaneous tissues lateral to the right hip measuring about 12.0 by 2.0 by 12.0 cm (volume = 150 cc). 2. Prominent stool throughout the colon favors constipation. 3. Mesenteric edema. Difficult to exclude a small amount of ascites. 4. Severe arthropathy of the hips, right greater than left, with volume loss in the femoral heads. Flattening and spurring of the right acetabulum with potential erosions. Possible synovitis in the right hip joint. 5. 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 6. 2.2 cm homogeneous right kidney lower pole lesion favoring a cyst but with internal density of 44 Hounsfield units on portal venous phase images. This is technically indeterminate for cyst versus mass. In most clinical circumstances, renal protocol MRI or CT with and without contrast would be recommended for follow up of this lesion, although correlation with the patient's overall  clinical scenario is recommended. 7. Calcified uterine fibroids. 8. Aortic Atherosclerosis (ICD10-I70.0). Notable atherosclerosis proximally in the celiac trunk and SMA with substantial stenosis although no overt occlusion. 9. Reduced diagnostic sensitivity and specificity related to patient's contracted state and left side down lateral decubitus positioning during imaging. Electronically Signed   By: Ryan Salvage M.D.   On: 06/18/2024 13:07   - pertinent xrays, CT, MRI studies were reviewed and independently interpreted  Positive ROS: All other systems have been reviewed and were otherwise negative with the exception of those mentioned in the HPI and as above.  Physical Exam: General: Not alert, no acute distress Psychiatric: Patient is not competent for consent with flat mood and affect Lymphatic: No axillary or cervical lymphadenopathy Cardiovascular: No pedal edema Respiratory: No cyanosis, no use of accessory musculature GI: No organomegaly, abdomen is soft and non-tender    Images:  @ENCIMAGES @  Labs:  Lab Results  Component Value Date   HGBA1C 5.6 02/25/2021   HGBA1C 5.4 09/25/2020   HGBA1C 5.7 02/20/2020   ESRSEDRATE 4 01/16/2018   CRP 0.1 (L) 01/16/2018   LABURIC 4.6 09/25/2020    Lab Results  Component Value Date   ALBUMIN 2.3 (L) 06/18/2024   ALBUMIN 3.4 (L) 01/02/2024   ALBUMIN 4.2 12/30/2022   LABURIC 4.6 09/25/2020        Latest Ref Rng & Units 06/18/2024    9:40 AM 05/14/2024    2:59 PM 01/02/2024   12:58 PM  CBC EXTENDED  WBC 4.0 - 10.5 K/uL 12.3  4.6  4.2   RBC 3.87 - 5.11 MIL/uL 4.42  4.74  4.34   Hemoglobin 12.0 - 15.0 g/dL 87.4  86.3  87.1   HCT 36.0 - 46.0 % 38.3  40.7  38.6   Platelets 150 - 400 K/uL 440  468.0  342.0   NEUT# 1.7 - 7.7 K/uL 10.6  2.7  2.5   Lymph# 0.7 - 4.0 K/uL 0.7  1.2  1.1     Neurologic: Patient does not have protective sensation bilateral lower extremities.   MUSCULOSKELETAL:   Skin: Emanation patient has a  large ischemic ulcer over the lateral right hip approximately 12 cm in diameter.  There is no purulent drainage the eschar is flat.  Review of the CT scan of the right hip shows air in the subcutaneous tissue consistent with the large necrotic ulcer.  Examination of the right lower extremity patient has gangrenous ulcers around the first metatarsal head as well as decubitus ulceration over the lateral aspect of the right foot.  Patient does not have palpable pulses.  Patient has fixed flexion contractures of both lower extremities with a fixed flexion contracture of the hip greater than 90 degrees and fixed flexion deformity of the knees greater than 90 degrees.  These cannot be straightened manually.  Assessment: Assessment: Fixed flexion contracture of the hips and knees bilaterally with gangrenous changes to the right foot medial column and decubitus ulcers to the lateral aspect of the right foot with a large decubitus ulcer to the right hip with air in the subcutaneous tissue.  Plan: Family and caregiver agreed to proceed with ankle-brachial indices to the lower extremities but do not want to consider any type of surgical intervention.  Patient would not be a candidate for endovascular evaluation due to her flexion contractures.  Discussed that I do not feel there is a surgical intervention that I could proceed with safely to debride the large decubitus ulcer on the right hip.  Feel that wound care and pressure offloading would be the safest option.    I have recommended obtaining a second opinion from general surgery to see if they have any recommendations for the right hip decubitus ulcer.  Thank you for the consult and the opportunity to see Ms. Jama Jerona Sage, MD [facility] [phone] 6:16 PM

## 2024-06-18 NOTE — ED Triage Notes (Signed)
 Pt has not had a BM in 3 weeks, have tried enema w/o relief.

## 2024-06-18 NOTE — ED Notes (Signed)
 IV team at bedside

## 2024-06-18 NOTE — ED Provider Notes (Addendum)
 [place] EMERGENCY DEPARTMENT AT [facility] Provider Note   CSN: [id number] Arrival date & time: 06/18/24  9077     Patient presents with: Wound Check   Sabrina Hart is a 89 y.o. female who presents to the ED today primarily due to reassessment of chronic decubitus ulcers on the bilateral gluteus as well as on the metatarsals bilaterally.  These been previously managed, had been healing previously, however since starting Eliquis  secondary to DVT she has had constant seepage from the wound and caregiver is concerned for infection of the wound.  Further they are concerned for dehydration as the patient has not had a bowel movement in the last 21 days.  At baseline, this patient is wheelchair-bound however they have noticed a decrease in her mentation over the last several weeks, and also decrease in her activity level.  They endorse poor oral intake during this period as well    Wound Check       Prior to Admission medications   Medication Sig Start Date End Date Taking? Authorizing Provider  amLODipine  (NORVASC ) 5 MG tablet Take 1 tablet (5 mg total) by mouth daily. 01/11/24   [name], MD  apixaban  (ELIQUIS ) 5 MG TABS tablet Take 1 tablet (5 mg total) by mouth 2 (two) times daily. 05/25/24   Barbarann Dixon B, RPH-CPP  APIXABAN  (ELIQUIS ) VTE STARTER PACK (10MG  AND 5MG ) Take as directed on package: start with two-5mg  tablets twice daily for 7 days. On day 8, switch to one-5mg  tablet twice daily. 05/25/24   Barbarann Dixon B, RPH-CPP  atorvastatin  (LIPITOR) 40 MG tablet Take 1 tablet (40 mg total) by mouth daily. Patient not taking: Reported on 05/02/2024 02/27/21   [name], MD  donepezil  (ARICEPT ) 5 MG tablet Take 1 tablet (5 mg total) by mouth at bedtime. 05/07/19   [name], MD  Incontinence Supply Disposable (PROCARE ADULT BRIEFS X-LARGE) MISC Use as directed. 03/09/23   [name], MD  latanoprost (XALATAN) 0.005 % ophthalmic solution Place 1  drop into both eyes at bedtime. 02/24/23   [provider]  levothyroxine  (SYNTHROID ) 88 MCG tablet Take 1 tablet (88 mcg total) by mouth every morning. 01/11/24   [name], MD  LORazepam  (ATIVAN ) 0.5 MG tablet TAKE 1 TABLET BY MOUTH AT BEDTIME AS NEEDED FOR ANXIETY. Patient not taking: Reported on 05/02/2024 06/27/23   [name], MD  magnesium oxide (MAG-OX) 400 (240 Mg) MG tablet Take 400 mg by mouth daily.    [provider]  risperiDONE  (RISPERDAL ) 0.5 MG tablet TAKE 1 TABLET BY MOUTH 2 TIMES DAILY. 04/30/24   [name], MD  sertraline  (ZOLOFT ) 25 MG tablet Take 1 tablet (25 mg total) by mouth daily. 01/11/24   [name], MD  SIMBRINZA 1-0.2 % SUSP Apply to eye. 12/27/22   [provider]    Allergies: Other, Pollen extract, Latex, Morphine and codeine, Nickel, and Penicillins    Review of Systems  Constitutional:  Positive for activity change and appetite change.  Skin:  Positive for wound.  All other systems reviewed and are negative.   Updated Vital Signs BP (!) 126/59   Pulse 81   Temp (!) 97.5 F (36.4 C)   Resp 14   Ht 5' 3 (1.6 m)   Wt 52.2 kg   SpO2 100%   BMI 20.37 kg/m   Physical Exam Vitals and nursing note reviewed.  Constitutional:      General:  She is not in acute distress.    Appearance: She is ill-appearing.  HENT:     Head: Normocephalic and atraumatic.     Mouth/Throat:     Mouth: Mucous membranes are dry.     Pharynx: Oropharynx is clear.  Eyes:     Extraocular Movements: Extraocular movements intact.     Conjunctiva/sclera: Conjunctivae normal.     Pupils: Pupils are equal, round, and reactive to light.  Cardiovascular:     Rate and Rhythm: Normal rate and regular rhythm.     Pulses: Normal pulses.     Heart sounds: Normal heart sounds. No murmur heard.    No friction rub. No gallop.  Pulmonary:     Effort: Pulmonary effort is normal.     Breath sounds: Normal breath sounds.  Abdominal:      General: Abdomen is flat. Bowel sounds are normal.     Palpations: Abdomen is soft.  Musculoskeletal:        General: Normal range of motion.     Cervical back: Normal range of motion and neck supple.     Right lower leg: No edema.     Left lower leg: No edema.  Skin:    General: Skin is warm and dry.     Capillary Refill: Capillary refill takes less than 2 seconds.     Findings: Wound present.         Comments: Multiple decubitus ulcers noted bilaterally on the gluteus, with decubitus on the right side being unstageable at this time, eschar is noted along with purulent exudate.  Further noted decubitus ulcers along the metatarsals, further unstageable secondary to eschar.  Poor skin turgor is appreciated  Neurological:     General: No focal deficit present.     Mental Status: She is lethargic.     GCS: GCS eye subscore is 4. GCS verbal subscore is 4. GCS motor subscore is 6.     Comments: Per family, patient is verbal at baseline however is only responding in single word responses and limited verbal response  Psychiatric:        Mood and Affect: Mood normal.             (all labs ordered are listed, but only abnormal results are displayed) Labs Reviewed  COMPREHENSIVE METABOLIC PANEL WITH GFR - Abnormal; Notable for the following components:      Result Value   Potassium 3.4 (*)    Glucose, Bld 158 (*)    BUN 39 (*)    Creatinine, Ser 1.15 (*)    Albumin 2.3 (*)    GFR, Estimated 45 (*)    All other components within normal limits  CBC WITH DIFFERENTIAL/PLATELET - Abnormal; Notable for the following components:   WBC 12.3 (*)    Platelets 440 (*)    Neutro Abs 10.6 (*)    Abs Immature Granulocytes 0.08 (*)    All other components within normal limits  I-STAT CG4 LACTIC ACID, ED - Abnormal; Notable for the following components:   Lactic Acid, Venous 3.7 (*)    All other components within normal limits  CULTURE, BLOOD (ROUTINE X 2)  CULTURE, BLOOD (ROUTINE X 2)   URINALYSIS, W/ REFLEX TO CULTURE (INFECTION SUSPECTED)  I-STAT CG4 LACTIC ACID, ED  I-STAT CG4 LACTIC ACID, ED    EKG: None  Radiology: CT ABDOMEN PELVIS W CONTRAST Result Date: 06/18/2024 CLINICAL DATA:  Bowel obstruction EXAM: CT ABDOMEN AND PELVIS WITH CONTRAST TECHNIQUE: Multidetector CT imaging of  the abdomen and pelvis was performed using the standard protocol following bolus administration of intravenous contrast. RADIATION DOSE REDUCTION: This exam was performed according to the departmental dose-optimization program which includes automated exposure control, adjustment of the mA and/or kV according to patient size and/or use of iterative reconstruction technique. CONTRAST:  75mL OMNIPAQUE  IOHEXOL  350 MG/ML SOLN COMPARISON:  None Available. FINDINGS: The patient was imaged in a contracted position laying on her left side, resulting in some shifting structures. 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 Descending thoracic aortic and right coronary artery atherosclerosis. The left lung base is excluded. Hepatobiliary: Unremarkable Pancreas: Unremarkable Spleen: Unremarkable where included Adrenals/Urinary Tract: 2.2 cm homogeneous right kidney lower pole lesion favoring a cyst but with internal density of 44 Hounsfield units on portal venous phase images. This is technically indeterminate for cyst versus mass. In most clinical circumstances, renal protocol MRI or CT with and without contrast would be recommended for follow up of this lesion, although correlation with the patient's overall clinical scenario is recommended. Adrenal glands unremarkable. No hydronephrosis or hydroureter. Urinary bladder unremarkable. Stomach/Bowel: Prominent stool throughout the colon favors constipation. No definite dilated small bowel loops are identified, although bowel loops are relatively indistinct in portions of the abdomen. Vascular/Lymphatic:  Atherosclerosis is present, including aortoiliac atherosclerotic disease. Substantial atheromatous vascular plaque at the origin of the celiac trunk and SMA contributing to potentially high-grade stenosis although both vessels appear to opacify and accordingly are likely not completely occluded. There is additional plaque further distally in the SMA. Reproductive: Calcified uterine fibroids. Other: Mesenteric edema. Difficult to exclude a small amount of ascites. Musculoskeletal: Suspected abscess in the subcutaneous tissues lateral to the right hip measuring about 12.0 by 2.0 by 12.0 cm (volume = 150 cm^3). Subcutaneous edema in the right proximal thigh posteriorly Severe arthropathy of the hips, right greater than left, with volume loss in the femoral heads. Flattening and spurring of the right acetabulum with potential erosions. Possible synovitis in the right hip joint. Degenerative arthropathy of the right elbow which is included in the field of imaging. IMPRESSION: 1. Crescentic abscess in the subcutaneous tissues lateral to the right hip measuring about 12.0 by 2.0 by 12.0 cm (volume = 150 cc). 2. Prominent stool throughout the colon favors constipation. 3. Mesenteric edema. Difficult to exclude a small amount of ascites. 4. Severe arthropathy of the hips, right greater than left, with volume loss in the femoral heads. Flattening and spurring of the right acetabulum with potential erosions. Possible synovitis in the right hip joint. 5. 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 6. 2.2 cm homogeneous right kidney lower pole lesion favoring a cyst but with internal density of 44 Hounsfield units on portal venous phase images. This is technically indeterminate for cyst versus mass. In most clinical circumstances, renal protocol MRI or CT with and without contrast would be recommended for follow up of this lesion, although correlation with the patient's overall  clinical scenario is recommended. 7. Calcified uterine fibroids. 8. Aortic Atherosclerosis (ICD10-I70.0). Notable atherosclerosis proximally in the celiac trunk and SMA with substantial stenosis although no overt occlusion. 9. Reduced diagnostic sensitivity and specificity related to patient's contracted state and left side down lateral decubitus positioning during imaging. Electronically Signed   By: Ryan Salvage M.D.   On: 06/18/2024 13:07     Procedures   Medications Ordered in the ED  aztreonam  (AZACTAM ) 2 g in sodium chloride   0.9 % 100 mL IVPB (0 g Intravenous Stopped 06/18/24 1126)  lactated ringers  bolus 1,000 mL (0 mLs Intravenous Stopped 06/18/24 1154)    And  lactated ringers  bolus 500 mL (0 mLs Intravenous Stopped 06/18/24 1120)    And  lactated ringers  bolus 250 mL (0 mLs Intravenous Stopped 06/18/24 1120)  vancomycin  (VANCOREADY) IVPB 1250 mg/250 mL (0 mg Intravenous Stopped 06/18/24 1321)  fentaNYL  (SUBLIMAZE ) injection 50 mcg (50 mcg Intravenous Given 06/18/24 1205)  iohexol  (OMNIPAQUE ) 350 MG/ML injection 75 mL (75 mLs Intravenous Contrast Given 06/18/24 1245)                                    Medical Decision Making Amount and/or Complexity of Data Reviewed Labs: ordered. Radiology: ordered.  Risk Prescription drug management. Decision regarding hospitalization.   Medical Decision Making:   Kallee Nam is a 89 y.o. female who presented to the ED today with wound concerns as well as concerns for dehydration and constipation detailed above.    Additional history discussed with patient's family/caregivers.  External chart has been reviewed including previous labs and imaging. Patient's presentation is complicated by their history of Alzheimer's dementia.  Patient placed on continuous vitals and telemetry monitoring while in ED which was reviewed periodically.  Complete initial physical exam performed, notably the patient  was alert but lethargic, responsive  to single word verbal responses however is able to follow commands with appropriate guidance.  Physical exam as noted, images documented.    Reviewed and confirmed nursing documentation for past medical history, family history, social history.    Initial Assessment:   With the patient's presentation of chronic decubitus ulcers with acute exacerbation, dehydration, most likely diagnosis is sepsis secondary to chronic wound infection, dehydration, consider possibility of bowel obstruction..   Initial Plan:  Obtain CT imaging of the abdomen to rule out acute bowel obstruction Screening labs including CBC and Metabolic panel to evaluate for infectious or metabolic etiology of disease.  Initiate IV antibiotics as noted secondary to toxic presentation. Urinalysis with reflex culture ordered to evaluate for UTI or relevant urologic/nephrologic pathology.   Obtain blood cultures and serum lactate Objective evaluation as below reviewed   Initial Study Results:   Laboratory  All laboratory results reviewed without evidence of clinically relevant pathology.   Exceptions include: Lactate elevated 3.7, leukocytosis of 12.2.,  Creatinine is 1.15 with decrease in GFR to 45.    Radiology:  All images reviewed independently. Agree with radiology report at this time.   CT ABDOMEN PELVIS W CONTRAST Result Date: 06/18/2024 CLINICAL DATA:  Bowel obstruction EXAM: CT ABDOMEN AND PELVIS WITH CONTRAST TECHNIQUE: Multidetector CT imaging of the abdomen and pelvis was performed using the standard protocol following bolus administration of intravenous contrast. RADIATION DOSE REDUCTION: This exam was performed according to the departmental dose-optimization program which includes automated exposure control, adjustment of the mA and/or kV according to patient size and/or use of iterative reconstruction technique. CONTRAST:  75mL OMNIPAQUE  IOHEXOL  350 MG/ML SOLN COMPARISON:  None Available. FINDINGS: The patient was  imaged in a contracted position laying on her left side, resulting in some shifting structures. 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 Descending thoracic aortic and right coronary artery atherosclerosis. The left lung base is excluded. Hepatobiliary: Unremarkable Pancreas: Unremarkable Spleen: Unremarkable where included Adrenals/Urinary Tract: 2.2 cm homogeneous right kidney lower pole lesion favoring  a cyst but with internal density of 44 Hounsfield units on portal venous phase images. This is technically indeterminate for cyst versus mass. In most clinical circumstances, renal protocol MRI or CT with and without contrast would be recommended for follow up of this lesion, although correlation with the patient's overall clinical scenario is recommended. Adrenal glands unremarkable. No hydronephrosis or hydroureter. Urinary bladder unremarkable. Stomach/Bowel: Prominent stool throughout the colon favors constipation. No definite dilated small bowel loops are identified, although bowel loops are relatively indistinct in portions of the abdomen. Vascular/Lymphatic: Atherosclerosis is present, including aortoiliac atherosclerotic disease. Substantial atheromatous vascular plaque at the origin of the celiac trunk and SMA contributing to potentially high-grade stenosis although both vessels appear to opacify and accordingly are likely not completely occluded. There is additional plaque further distally in the SMA. Reproductive: Calcified uterine fibroids. Other: Mesenteric edema. Difficult to exclude a small amount of ascites. Musculoskeletal: Suspected abscess in the subcutaneous tissues lateral to the right hip measuring about 12.0 by 2.0 by 12.0 cm (volume = 150 cm^3). Subcutaneous edema in the right proximal thigh posteriorly Severe arthropathy of the hips, right greater than left, with volume loss in the femoral heads. Flattening and spurring of the  right acetabulum with potential erosions. Possible synovitis in the right hip joint. Degenerative arthropathy of the right elbow which is included in the field of imaging. IMPRESSION: 1. Crescentic abscess in the subcutaneous tissues lateral to the right hip measuring about 12.0 by 2.0 by 12.0 cm (volume = 150 cc). 2. Prominent stool throughout the colon favors constipation. 3. Mesenteric edema. Difficult to exclude a small amount of ascites. 4. Severe arthropathy of the hips, right greater than left, with volume loss in the femoral heads. Flattening and spurring of the right acetabulum with potential erosions. Possible synovitis in the right hip joint. 5. 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 6. 2.2 cm homogeneous right kidney lower pole lesion favoring a cyst but with internal density of 44 Hounsfield units on portal venous phase images. This is technically indeterminate for cyst versus mass. In most clinical circumstances, renal protocol MRI or CT with and without contrast would be recommended for follow up of this lesion, although correlation with the patient's overall clinical scenario is recommended. 7. Calcified uterine fibroids. 8. Aortic Atherosclerosis (ICD10-I70.0). Notable atherosclerosis proximally in the celiac trunk and SMA with substantial stenosis although no overt occlusion. 9. Reduced diagnostic sensitivity and specificity related to patient's contracted state and left side down lateral decubitus positioning during imaging. Electronically Signed   By: Ryan Salvage M.D.   On: 06/18/2024 13:07   VAS US  LOWER EXTREMITY VENOUS (DVT) Result Date: 05/25/2024  Lower Venous DVT Study Patient Name:  Aldean Suddeth  Date of Exam:   05/25/2024 Medical Rec #: [id number]           Accession #:    [id number] Date of Birth: 1932-01-31            Patient Gender: F Patient Age:   18 years Exam Location:  [address] Procedure:      VAS US  LOWER  EXTREMITY VENOUS (DVT) Referring Phys: CLOTILDA BANKS --------------------------------------------------------------------------------  Indications: Swelling.  Limitations: Patient had legs locked and contracted. Performing Technologist: Alan Greenhouse RDMS, RVT, RDCS  Examination Guidelines: A complete evaluation includes B-mode imaging, spectral Doppler, color Doppler, and power Doppler as needed of all accessible portions of each vessel. Bilateral testing is considered an integral part of a complete examination.  Limited examinations for reoccurring indications may be performed as noted. The reflux portion of the exam is performed with the patient in reverse Trendelenburg.  +---------+---------------+---------+-----------+----------+--------------+ RIGHT    CompressibilityPhasicitySpontaneityPropertiesThrombus Aging +---------+---------------+---------+-----------+----------+--------------+ CFV      Partial        Yes      No                   Acute          +---------+---------------+---------+-----------+----------+--------------+ SFJ      Full           Yes      Yes                                 +---------+---------------+---------+-----------+----------+--------------+ FV Prox  Partial        Yes      No                                  +---------+---------------+---------+-----------+----------+--------------+ FV Mid   Partial        Yes      No                                  +---------+---------------+---------+-----------+----------+--------------+ FV DistalPartial        Yes      No                                  +---------+---------------+---------+-----------+----------+--------------+ PFV      Partial        Yes      No                                  +---------+---------------+---------+-----------+----------+--------------+ POP      Partial        Yes      No                                   +---------+---------------+---------+-----------+----------+--------------+ PTV      Full           Yes                                          +---------+---------------+---------+-----------+----------+--------------+ Gastroc  Partial        Yes      No                                  +---------+---------------+---------+-----------+----------+--------------+ Nonocclusive acute DVT seen in the right CFV, profunda vein, FV, popliteal vein and proximal gastrocnemius veins. Right peroneal veins not well visualized.  +----+---------------+---------+-----------+----------+--------------+ LEFTCompressibilityPhasicitySpontaneityPropertiesThrombus Aging +----+---------------+---------+-----------+----------+--------------+ CFV Full           Yes      Yes                                 +----+---------------+---------+-----------+----------+--------------+  Summary: RIGHT: - Findings consistent with acute deep vein thrombosis involving the right common femoral vein, right femoral vein, right proximal profunda vein, and right popliteal vein.  - There is no evidence of superficial venous thrombosis.  LEFT: - No evidence of common femoral vein obstruction.   *See table(s) above for measurements and observations. Electronically signed by Norman Serve on 05/25/2024 at 4:07:30 PM.    Final       Consults: Case discussed with Dr. Seena from the hospitalist team, also placed consult for surgery considering the abscess found on CT scan..   Reassessment and Plan:   Based on initial presentation, patient was begun on antibiotics as noted.  Hospitalist consulted for admission who accepts for new onset AKI along with severe infection and presence of a abscess found on CT scan.  General surgery consulted regarding this patient and need for abscess drainage.  Plan this time is for admission to the hospital for continued IV antibiotics and surgical debridement of the affected wound  areas.  Further consulted with orthopedic surgery, this is secondary to abscess which extends into the hip joint on the right.  They will consult with patient and work with hospitalist team regarding further surgical management of this patient.       Final diagnoses:  Sepsis with acute renal failure without septic shock, due to unspecified organism, unspecified acute renal failure type [facility])  Visit for wound check  Cellulitis of buttock  Pressure injury of skin, unspecified injury stage, unspecified location    ED Discharge Orders     None          Myriam Dorn BROCKS, PA 06/18/24 1326    Myriam Dorn BROCKS, [place] 06/18/24 1355    Yolande Lamar BROCKS, MD 06/23/24 [phone]

## 2024-06-18 NOTE — ED Notes (Signed)
 Called CCMD.

## 2024-06-19 ENCOUNTER — Inpatient Hospital Stay (HOSPITAL_COMMUNITY)

## 2024-06-19 DIAGNOSIS — L97909 Non-pressure chronic ulcer of unspecified part of unspecified lower leg with unspecified severity: Secondary | ICD-10-CM

## 2024-06-19 DIAGNOSIS — R6510 Systemic inflammatory response syndrome (SIRS) of non-infectious origin without acute organ dysfunction: Secondary | ICD-10-CM | POA: Diagnosis not present

## 2024-06-19 DIAGNOSIS — L8990 Pressure ulcer of unspecified site, unspecified stage: Secondary | ICD-10-CM | POA: Diagnosis not present

## 2024-06-19 DIAGNOSIS — I96 Gangrene, not elsewhere classified: Secondary | ICD-10-CM | POA: Diagnosis not present

## 2024-06-19 LAB — BLOOD CULTURE ID PANEL (REFLEXED) - BCID2

## 2024-06-19 LAB — URINALYSIS, W/ REFLEX TO CULTURE (INFECTION SUSPECTED)
Bilirubin Urine: NEGATIVE
Glucose, UA: NEGATIVE mg/dL
Ketones, ur: NEGATIVE mg/dL
Nitrite: NEGATIVE
Protein, ur: NEGATIVE mg/dL
Specific Gravity, Urine: >1.046 — ABNORMAL HIGH (ref 1.005–1.030)
WBC, UA: >50 WBC/hpf (ref 0–5)
pH: 5 (ref 5.0–8.0)

## 2024-06-19 LAB — COMPREHENSIVE METABOLIC PANEL WITH GFR
ALT: 16 U/L (ref 0–44)
AST: 26 U/L (ref 15–41)
Albumin: 1.9 g/dL — ABNORMAL LOW (ref 3.5–5.0)
Alkaline Phosphatase: 81 U/L (ref 38–126)
Anion gap: 11 (ref 5–15)
BUN: 22 mg/dL (ref 8–23)
CO2: 25 mmol/L (ref 22–32)
Calcium: 8.5 mg/dL — ABNORMAL LOW (ref 8.9–10.3)
Chloride: 98 mmol/L (ref 98–111)
Creatinine, Ser: 0.56 mg/dL (ref 0.44–1.00)
GFR, Estimated: >60 mL/min (ref >60)
Glucose, Bld: 89 mg/dL (ref 70–99)
Potassium: 2.9 mmol/L — ABNORMAL LOW (ref 3.5–5.1)
Sodium: 134 mmol/L — ABNORMAL LOW (ref 135–145)
Total Bilirubin: 0.8 mg/dL (ref 0.0–1.2)
Total Protein: 5.9 g/dL — ABNORMAL LOW (ref 6.5–8.1)

## 2024-06-19 LAB — CBC
HCT: 31.1 % — ABNORMAL LOW (ref 36.0–46.0)
Hemoglobin: 10.1 g/dL — ABNORMAL LOW (ref 12.0–15.0)
MCH: 28.1 pg (ref 26.0–34.0)
MCHC: 32.5 g/dL (ref 30.0–36.0)
MCV: 86.4 fL (ref 80.0–100.0)
Platelets: 371 K/uL (ref 150–400)
RBC: 3.6 MIL/uL — ABNORMAL LOW (ref 3.87–5.11)
RDW: 13.3 % (ref 11.5–15.5)
WBC: 8.7 K/uL (ref 4.0–10.5)
nRBC: 0 % (ref 0.0–0.2)

## 2024-06-19 LAB — GLUCOSE, CAPILLARY
Glucose-Capillary: 76 mg/dL (ref 70–99)
Glucose-Capillary: 78 mg/dL (ref 70–99)
Glucose-Capillary: 47 mg/dL — ABNORMAL LOW (ref 70–99)
Glucose-Capillary: 67 mg/dL — ABNORMAL LOW (ref 70–99)
Glucose-Capillary: 94 mg/dL (ref 70–99)
Glucose-Capillary: 79 mg/dL (ref 70–99)
Glucose-Capillary: 97 mg/dL (ref 70–99)

## 2024-06-19 LAB — VAS US ABI WITH/WO TBI
Left ABI: 1.01
Right ABI: 0.76

## 2024-06-19 LAB — MRSA NEXT GEN BY PCR, NASAL: MRSA by PCR Next Gen: NOT DETECTED

## 2024-06-19 MED ORDER — KCL IN DEXTROSE-NACL 20-5-0.9 MEQ/L-%-% IV SOLN
INTRAVENOUS | Status: AC
  Filled 2024-06-19 (×2): qty 1000

## 2024-06-19 MED ORDER — COLLAGENASE 250 UNIT/GM EX OINT
TOPICAL_OINTMENT | Freq: Every day | CUTANEOUS | Status: DC
  Filled 2024-06-19 (×6): qty 30

## 2024-06-19 MED ORDER — KCL IN DEXTROSE-NACL 40-5-0.9 MEQ/L-%-% IV SOLN
INTRAVENOUS | Status: DC
  Filled 2024-06-19: qty 1000

## 2024-06-19 MED ORDER — POTASSIUM CHLORIDE 2 MEQ/ML IV SOLN: INTRAVENOUS | Status: DC

## 2024-06-19 MED ORDER — POTASSIUM CHLORIDE 10 MEQ/100ML IV SOLN
10.0000 meq | INTRAVENOUS | Status: AC
  Administered 2024-06-19 (×6): 10 meq via INTRAVENOUS
  Filled 2024-06-19 (×6): qty 100

## 2024-06-19 MED ORDER — GADOBUTROL 1 MMOL/ML IV SOLN
4.0000 mL | Freq: Once | INTRAVENOUS | Status: AC | PRN
  Administered 2024-06-19: 4 mL via INTRAVENOUS

## 2024-06-19 MED ORDER — DOCUSATE SODIUM 100 MG PO CAPS
100.0000 mg | ORAL_CAPSULE | Freq: Two times a day (BID) | ORAL | Status: DC
  Administered 2024-06-20 – 2024-06-26 (×12): 100 mg via ORAL
  Filled 2024-06-19 (×13): qty 1

## 2024-06-19 MED ORDER — DEXTROSE 50 % IV SOLN
25.0000 mL | Freq: Once | INTRAVENOUS | Status: AC
  Administered 2024-06-19: 25 mL via INTRAVENOUS

## 2024-06-19 MED ORDER — POTASSIUM CHLORIDE CRYS ER 20 MEQ PO TBCR: 40.0000 meq | EXTENDED_RELEASE_TABLET | ORAL | Status: DC

## 2024-06-19 MED ORDER — SODIUM CHLORIDE 0.9 % IV SOLN
2.0000 g | INTRAVENOUS | Status: DC
  Administered 2024-06-19: 2 g via INTRAVENOUS
  Filled 2024-06-19: qty 20

## 2024-06-19 MED ORDER — DEXTROSE 50 % IV SOLN
INTRAVENOUS | Status: AC
  Administered 2024-06-19: 12.5 g via INTRAMUSCULAR
  Filled 2024-06-19: qty 50

## 2024-06-19 MED ORDER — VANCOMYCIN HCL 1250 MG/250ML IV SOLN
1250.0000 mg | INTRAVENOUS | Status: DC
  Administered 2024-06-20: 1250 mg via INTRAVENOUS
  Filled 2024-06-19: qty 250

## 2024-06-19 NOTE — Progress Notes (Signed)
 SLP Cancellation Note  Patient Details Name: Sabrina Hart MRN: [id number] DOB: [birth year]   Cancelled treatment:       Reason Eval/Treat Not Completed: Other (comment). Discussed with MD. No need for cognitive assessment as this time. Will d/c orders. If SLP swallow eval needed, happy to see pt.    Shayana Hornstein, Consuelo Fitch 06/19/2024, 10:10 AM

## 2024-06-19 NOTE — Progress Notes (Signed)
 ABI exam is completed. Kylie Simmonds, RVT

## 2024-06-19 NOTE — Progress Notes (Addendum)
 PROGRESS NOTE    Sabrina Hart  FMW:[id number] DOB: 12/02/1931 DOA: 06/18/2024 PCP: Sabrina Clotilda [name], MD   Brief Narrative:  HPI: Sabrina Hart is a 89 y.o. female with medical history significant of hypertension, hyperlipidemia, hypothyroidism, CAD status post stent, dementia, glaucoma, DVT presenting with confusion, wound changes, constipation.   History obtained assistance of chart review and family due to patient's confusion and chronic dementia.  Patient has a chronic decubitus ulcer on her bilateral gluteal regions as well as her metatarsal regions.  She has not followed outpatient and wounds have been healing.  There has been some increased seepage after starting Eliquis  for DVT. Including bloody drainage.  There is also been some nosebleeding and some intermittent blood in the mouth in the morning possibly due to nosebleeding that is being swallowed/pooling in the mouth.   Family reports patient has been constipated without a bowel movement for the past 3 weeks.  They are concerned patient is becoming dehydrated as well exacerbating this.  Patient is wheelchair-bound at baseline.  Family also notes a decline in mentation and oral activity for the past several weeks as well.   Patient unable to accurately participate in review of systems due to confusion and baseline dementia.   ED Course: Vital signs in the ED notable for blood pressure in the 100s-120 systolic.  Lab workup included CMP with potassium 3.4, BUN 39, creatinine elevated to 1.15 from baseline of 0.8, glucose 158, albumin 2.3.  CBC with leukocytosis of 12.3, platelets 440.  Lactic acid normal on first check and then elevated to 3.7 on repeat.  Urinalysis and blood cultures pending.     CT abdomen pelvis showed crescentic abscess at the right hip measuring 12 x 2 x 12 cm, prominent stool favoring constipation, mesenteric edema unable to exclude small ascites, severe arthropathy of the hips possible synovitis in the right  hip.  Right kidney lesion favoring a cyst but is indeterminant MRI recommended for follow-up versus CT with and without contrast.  Calcified uterus.  Sensitivity lower due to patient in the left lateral decubitus.   Patient received vancomycin , aztreonam  in the ED in the setting of penicillin allergy.  Also received fentanyl  and 1.75 L of IV fluid.  Assessment & Plan:   Principal Problem:   SIRS (systemic inflammatory response syndrome) (HCC) Active Problems:   CAD (coronary artery disease)   Hypothyroidism   Hyperlipidemia   Severe late onset Alzheimer's dementia (HCC)   Essential hypertension   Primary open angle glaucoma (POAG) of both eyes   Pressure injury of right hip, unstageable (HCC)   Gangrene of right foot (HCC)  Severe sepsis secondary to infected decubitus ulcers, right hip abscess and abscess as well as osteomyelitis of the first metatarsal head of the right foot, POA: Patient met sepsis criteria based on tachypnea and leukocytosis and lactic acid of 3.7. > CT abdomen pelvis showed crescentic abscess at the right hip measuring 12 x 2 x 12 cm, prominent stool favoring constipation, mesenteric edema unable to exclude small ascites, severe arthropathy of the hips possible synovitis in the right hip.  > Certainly has abscess at the right hip and concern for synovitis.  Lower extremity wounds with some infection.  Wound care consulted. Patient seen by Dr. Harden, plan for bilateral ABI.  Per him, patient would not be candidate for endovascular evaluation due to her flexion contractures.  And he did not feel that there is a surgical intervention that he could proceed with safeLY to  debride the large decubitus ulcers on the right hip.  No option for abscess either.  Eventually MRI of the right foot showed Large area of soft tissue ulceration medial to the 1st metatarsophalangeal joint with underlying abscess and underlying osteomyelitis of the 1st metatarsal head, its sesamoids and base of  the 1st proximal phalanx.  Patient started on vancomycin  and aztreonam  due to penicillin allergy.  No fever.  Follow blood culture.  Lactic acid repeat is pending.  Further management per orthopedics.  Acute septic encephalopathy, POA: Improved, she is at baseline.  Right kidney lesion favoring a cyst but is indeterminant MRI recommended for follow-up versus CT with and without contrast.  Calcified uterus.  Sensitivity lower due to patient in the left lateral decubitus.  Hypoglycemia: 47 this morning.  Starting on D5 NS at 75 cc/h.  Constipation: MiraLAX  and Fleet enema ordered.  Will order Colace.  AKI: Likely ATN due to severe sepsis.  Resolved with IV fluids  Hypokalemia: Low again, will replenish.  Mild hyponatremia: Monitor.   Hypertension - Holding home amlodipine  in the setting of above, still hypotensive.   Hyperlipidemia - Atorvastatin  held outpatient   CAD > Status post stents - ContinueEliquis   Hypothyroidism - Continue home Synthroid    Glaucoma - Continue eyedrops once confirmed   History of DVT - Holding Eliquis  for now pending surgical evaluation.  Will likely resume after if okay with surgery colleagues.   Dementia - Continue home donepezil , risperidone , Ativan , sertraline  once confirmed  DVT prophylaxis: Place and maintain sequential compression device Start: 06/18/24 1339   Code Status: Limited: Do not attempt resuscitation (DNR) -DNR-LIMITED -Do Not Intubate/DNI   Family Communication: Patient's nurse present at bedside.  Plan of care discussed in length with her.  Several questions answered.  Status is: Inpatient Remains inpatient appropriate because: Needs to be seen by orthopedics for further plan of care.   Estimated body mass index is 19.37 kg/m as calculated from the following:   Height as of this encounter: 5' 3 (1.6 m).   Weight as of this encounter: 49.6 kg.    Nutritional Assessment: Body mass index is 19.37 kg/m.[name] Seen by  dietician.  I agree with the assessment and plan as outlined below: Nutrition Status:   . Skin Assessment: I have examined the patient's skin and I agree with the wound assessment as performed by the wound care RN as outlined below:    Consultants:  Orthopedics  Procedures:  None so far  Antimicrobials:  Anti-infectives (From admission, onward)    Start     Dose/Rate Route Frequency Ordered Stop   06/20/24 1200  vancomycin  (VANCOCIN ) IVPB 1000 mg/200 mL premix        1,000 mg 200 mL/hr over 60 Minutes Intravenous Every 48 hours 06/18/24 1432     06/18/24 2000  aztreonam  (AZACTAM ) 1 g in sodium chloride  0.9 % 100 mL IVPB        1 g 200 mL/hr over 30 Minutes Intravenous Every 8 hours 06/18/24 1432     06/18/24 1015  aztreonam  (AZACTAM ) 2 g in sodium chloride  0.9 % 100 mL IVPB        2 g 200 mL/hr over 30 Minutes Intravenous  Once 06/18/24 1008 06/18/24 1126   06/18/24 1015  vancomycin  (VANCOCIN ) IVPB 1000 mg/200 mL premix  Status:  Discontinued        1,000 mg 200 mL/hr over 60 Minutes Intravenous  Once 06/18/24 1008 06/18/24 1014   06/18/24 1015  vancomycin  (VANCOREADY)  IVPB 1250 mg/250 mL        1,250 mg 166.7 mL/hr over 90 Minutes Intravenous  Once 06/18/24 1014 06/18/24 1321         Subjective: Patient seen and examined, patient's home nurse at the bedside.  Patient awake, alert and oriented to self.  Per nurse, this is her baseline and she is much better today.  Patient had no complaint at all.  Lengthy discussion with the patient's home nurse.  Discussed with her about new findings of osteomyelitis in the foot.  She was clear that family is already clear that they do not want any surgical intervention intervention or any amputation however they would like for the left hip wound to be debrided.  She understands that Dr. Harden mentioned yesterday that he would not do any debridement either.  She would like to have second opinion from another orthopedics.  I advised her to  discuss with Dr. Harden about that.  Objective: Vitals:   06/18/24 1938 06/18/24 2334 06/19/24 0338 06/19/24 0700  BP: (!) 110/47 (!) 114/57 (!) 115/42 (!) 108/36  Pulse: 78 76 75 76  Resp: 15 18 15 15   Temp: 97.7 F (36.5 C) 97.6 F (36.4 C)  (!) 96.9 F (36.1 C)  TempSrc: Axillary Axillary  Axillary  SpO2: 97% 98% 100% 100%  Weight: 49.6 kg     Height: 5' 3 (1.6 m)       Intake/Output Summary (Last 24 hours) at 06/19/2024 0914 Last data filed at 06/19/2024 9371 Gross per 24 hour  Intake 100 ml  Output 300 ml  Net -200 ml   Filed Weights   06/18/24 0936 06/18/24 1938  Weight: 52.2 kg 49.6 kg    Examination:  General exam: Appears calm and comfortable  Respiratory system: Clear to auscultation. Respiratory effort normal. Cardiovascular system: S1 & S2 heard, RRR. No JVD, murmurs, rubs, gallops or clicks. No pedal edema. Gastrointestinal system: Abdomen is nondistended, soft and nontender. No organomegaly or masses felt. Normal bowel sounds heard. Central nervous system: Alert and oriented x 1.  No focal deficit but she has contractures in the lower extremities. Extremities: Symmetric 5 x 5 power. Skin: Wounds at bilateral hips.  Had dressing.  Data Reviewed: I have personally reviewed following labs and imaging studies  CBC: Recent Labs  Lab 06/18/24 0940 06/19/24 0554  WBC 12.3* 8.7  NEUTROABS 10.6*  --   HGB 12.5 10.1*  HCT 38.3 31.1*  MCV 86.7 86.4  PLT 440* 371   Basic Metabolic Panel: Recent Labs  Lab 06/18/24 0940 06/19/24 0554  NA 135 134*  K 3.4* 2.9*  CL 98 98  CO2 26 25  GLUCOSE 158* 89  BUN 39* 22  CREATININE 1.15* 0.56  CALCIUM  9.0 8.5*  MG 2.3  --    GFR: Estimated Creatinine Clearance: 36.6 mL/min (by C-G formula based on SCr of 0.56 mg/dL). Liver Function Tests: Recent Labs  Lab 06/18/24 0940 06/19/24 0554  AST 39 26  ALT 20 16  ALKPHOS 104 81  BILITOT 1.0 0.8  PROT 7.4 5.9*  ALBUMIN 2.3* 1.9*   No results for input(s):  LIPASE, AMYLASE in the last 168 hours. No results for input(s): AMMONIA in the last 168 hours. Coagulation Profile: No results for input(s): INR, PROTIME in the last 168 hours. Cardiac Enzymes: No results for input(s): CKTOTAL, CKMB, CKMBINDEX, TROPONINI in the last 168 hours. BNP (last 3 results) No results for input(s): PROBNP in the last 8760 hours. HbA1C: No results  for input(s): HGBA1C in the last 72 hours. CBG: Recent Labs  Lab 06/19/24 0533 06/19/24 0710  GLUCAP 76 78   Lipid Profile: No results for input(s): CHOL, HDL, LDLCALC, TRIG, CHOLHDL, LDLDIRECT in the last 72 hours. Thyroid  Function Tests: No results for input(s): TSH, T4TOTAL, FREET4, T3FREE, THYROIDAB in the last 72 hours. Anemia Panel: No results for input(s): VITAMINB12, FOLATE, FERRITIN, TIBC, IRON, RETICCTPCT in the last 72 hours. Sepsis Labs: Recent Labs  Lab 06/18/24 0950 06/18/24 1141  LATICACIDVEN 1.9 3.7*    Recent Results (from the past 240 hours)  Urine Culture     Status: None (Preliminary result)   Collection Time: 06/18/24  1:21 AM   Specimen: Urine, Random  Result Value Ref Range Status   Specimen Description URINE, RANDOM  Final   Special Requests   Final    URINE, CLEAN CATCH Performed at [facility], [address] [place], [place] [address]    Culture PENDING  Incomplete   Report Status PENDING  Incomplete  Culture, blood (routine x 2)     Status: None (Preliminary result)   Collection Time: 06/18/24 10:30 AM   Specimen: BLOOD RIGHT ARM  Result Value Ref Range Status   Specimen Description BLOOD RIGHT ARM  Final   Special Requests   Final    BOTTLES DRAWN AEROBIC AND ANAEROBIC Blood Culture adequate volume   Culture   Final    NO GROWTH < 24 HOURS Performed at [facility], [address] [place], [place] [address]    Report Status PENDING  Incomplete  Culture, blood (routine x 2)     Status: None  (Preliminary result)   Collection Time: 06/18/24 10:35 AM   Specimen: BLOOD LEFT ARM  Result Value Ref Range Status   Specimen Description BLOOD LEFT ARM  Final   Special Requests   Final    BOTTLES DRAWN AEROBIC AND ANAEROBIC Blood Culture adequate volume   Culture   Final    NO GROWTH < 24 HOURS Performed at [id number], [address] [place], [place] [address]    Report Status PENDING  Incomplete  MRSA Next Gen by PCR, Nasal     Status: None   Collection Time: 06/18/24  8:47 PM   Specimen: Nasal Mucosa; Nasal Swab  Result Value Ref Range Status   MRSA by PCR Next Gen NOT DETECTED NOT DETECTED Final    Comment: (NOTE) The GeneXpert MRSA Assay (FDA approved for NASAL specimens only), is one component of a comprehensive MRSA colonization surveillance program. It is not intended to diagnose MRSA infection nor to guide or monitor treatment for MRSA infections. Test performance is not FDA approved in patients less than 61 years old. Performed at [facility], [address] [place], [place] [address]      Radiology Studies: MR FOOT RIGHT W WO CONTRAST Result Date: 06/19/2024 CLINICAL DATA:  Soft tissue infection suspected, foot, xray done EXAM: MRI OF THE RIGHT FOREFOOT WITHOUT AND WITH CONTRAST TECHNIQUE: Multiplanar, multisequence MR imaging of the right forefoot was performed before and after the administration of intravenous contrast. CONTRAST:  4mL GADAVIST  GADOBUTROL  1 MMOL/ML IV SOLN COMPARISON:  None Available. FINDINGS: Technical note: Despite efforts by the technologist and patient, mild-to-moderate motion artifact is present on today's exam and could not be eliminated. This reduces exam sensitivity and specificity. Bones/Joint/Cartilage There is a large area of soft tissue ulceration medial to the 1st metatarsophalangeal joint, further described below. There  are underlying marrow changes within the 1st metatarsal head, its sesamoids and the base of the 1st  proximal phalanx, highly suspicious for osteomyelitis. Specifically, there is decreased T1 signal, T2 hyperintensity and heterogeneous enhancement following contrast. There is apparent cortical destruction involving the medial head of the 1st metatarsal T2 hyperintensity extends proximally into the mid shaft of the 1st metatarsal. There is a small effusion of the 1st metatarsophalangeal joint with associated synovial enhancement following contrast. No suspicious marrow signal abnormalities or enhancement elsewhere in the forefoot. Mild midfoot and 2nd metatarsophalangeal joint degenerative changes. Ligaments Intact Lisfranc ligament. The medial collateral ligament the 1st metatarsophalangeal joint is not well visualized. The additional collateral ligaments appear intact. Muscles and Tendons Mild generalized muscular atrophy and edema with low level muscular enhancement medially. No discrete intramuscular fluid collections are identified. The forefoot tendons appear intact without significant tenosynovitis. Soft tissues As above, large area of soft tissue ulceration medial to the 1st metatarsophalangeal joint within underlying heterogeneous, peripherally enhancing fluid collection measuring approximately 2.2 x 2.6 x 1.7 cm, suspicious for an abscess. This abuts the 1st metatarsophalangeal joint and is associated with adjacent synovial enhancement and marrow changes consistent with osteomyelitis, as described above. Generalized subcutaneous edema throughout the forefoot without other focal fluid collection. IMPRESSION: 1. Large area of soft tissue ulceration medial to the 1st metatarsophalangeal joint with underlying abscess and underlying osteomyelitis of the 1st metatarsal head, its sesamoids and base of the 1st proximal phalanx. 2. The abscess abuts the 1st metatarsophalangeal joint and is associated with a small effusion and synovial enhancement, suspicious for septic arthritis. 3. No evidence of osteomyelitis  elsewhere in the forefoot. 4. Generalized subcutaneous edema throughout the forefoot without other focal fluid collection. Electronically Signed   By: Elsie Perone M.D.   On: 06/19/2024 08:53   CT ABDOMEN PELVIS W CONTRAST Result Date: 06/18/2024 CLINICAL DATA:  Bowel obstruction EXAM: CT ABDOMEN AND PELVIS WITH CONTRAST TECHNIQUE: Multidetector CT imaging of the abdomen and pelvis was performed using the standard protocol following bolus administration of intravenous contrast. RADIATION DOSE REDUCTION: This exam was performed according to the departmental dose-optimization program which includes automated exposure control, adjustment of the mA and/or kV according to patient size and/or use of iterative reconstruction technique. CONTRAST:  75mL OMNIPAQUE  IOHEXOL  350 MG/ML SOLN COMPARISON:  None Available. FINDINGS: The patient was imaged in a contracted position laying on her left side, resulting in some shifting structures. 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 Descending thoracic aortic and right coronary artery atherosclerosis. The left lung base is excluded. Hepatobiliary: Unremarkable Pancreas: Unremarkable Spleen: Unremarkable where included Adrenals/Urinary Tract: 2.2 cm homogeneous right kidney lower pole lesion favoring a cyst but with internal density of 44 Hounsfield units on portal venous phase images. This is technically indeterminate for cyst versus mass. In most clinical circumstances, renal protocol MRI or CT with and without contrast would be recommended for follow up of this lesion, although correlation with the patient's overall clinical scenario is recommended. Adrenal glands unremarkable. No hydronephrosis or hydroureter. Urinary bladder unremarkable. Stomach/Bowel: Prominent stool throughout the colon favors constipation. No definite dilated small bowel loops are identified, although bowel loops are relatively indistinct  in portions of the abdomen. Vascular/Lymphatic: Atherosclerosis is present, including aortoiliac atherosclerotic disease. Substantial atheromatous vascular plaque at the origin of the celiac trunk and SMA contributing to potentially high-grade stenosis although both vessels appear to opacify and accordingly are likely not completely occluded. There is additional  plaque further distally in the SMA. Reproductive: Calcified uterine fibroids. Other: Mesenteric edema. Difficult to exclude a small amount of ascites. Musculoskeletal: Suspected abscess in the subcutaneous tissues lateral to the right hip measuring about 12.0 by 2.0 by 12.0 cm (volume = 150 cm^3). Subcutaneous edema in the right proximal thigh posteriorly Severe arthropathy of the hips, right greater than left, with volume loss in the femoral heads. Flattening and spurring of the right acetabulum with potential erosions. Possible synovitis in the right hip joint. Degenerative arthropathy of the right elbow which is included in the field of imaging. IMPRESSION: 1. Crescentic abscess in the subcutaneous tissues lateral to the right hip measuring about 12.0 by 2.0 by 12.0 cm (volume = 150 cc). 2. Prominent stool throughout the colon favors constipation. 3. Mesenteric edema. Difficult to exclude a small amount of ascites. 4. Severe arthropathy of the hips, right greater than left, with volume loss in the femoral heads. Flattening and spurring of the right acetabulum with potential erosions. Possible synovitis in the right hip joint. 5. 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 6. 2.2 cm homogeneous right kidney lower pole lesion favoring a cyst but with internal density of 44 Hounsfield units on portal venous phase images. This is technically indeterminate for cyst versus mass. In most clinical circumstances, renal protocol MRI or CT with and without contrast would be recommended for follow up of this lesion,  although correlation with the patient's overall clinical scenario is recommended. 7. Calcified uterine fibroids. 8. Aortic Atherosclerosis (ICD10-I70.0). Notable atherosclerosis proximally in the celiac trunk and SMA with substantial stenosis although no overt occlusion. 9. Reduced diagnostic sensitivity and specificity related to patient's contracted state and left side down lateral decubitus positioning during imaging. Electronically Signed   By: Ryan Salvage M.D.   On: 06/18/2024 13:07    Scheduled Meds:  acetaminophen   1,000 mg Oral Q6H   collagenase    Topical Daily   donepezil   5 mg Oral QHS   levothyroxine   88 mcg Oral Daily   polyethylene glycol  17 g Oral BID   sodium chloride  flush  3 mL Intravenous Q12H   Continuous Infusions:  aztreonam  1 g (06/19/24 0837)   potassium chloride  10 mEq (06/19/24 0842)   [START ON 06/20/2024] vancomycin        LOS: 1 day   Fredia Skeeter, MD [facility]  06/19/2024, 9:14 AM   *Please note that this is a verbal dictation therefore any spelling or grammatical errors are due to the Dragon Medical One system interpretation.  Please page via Amion and do not message via secure chat for urgent patient care matters. Secure chat can be used for non urgent patient care matters.  How to contact the [facility] Attending or Consulting provider 7A - 7P or covering provider during after hours 7P -7A, for this patient?  Check the care team in [facility] and look for a) attending/consulting [facility] provider listed and b) the [facility] team listed. Page or secure chat 7A-7P. Log into [email or web address] and use [place]'s universal password to access. If you do not have the password, please contact the hospital operator. Locate the [facility] provider you are looking for under [facility] and page to a number that you can be directly reached. If you still have difficulty reaching the provider, please page the [facility] (Director on Call) for the Hospitalists listed on amion for  assistance.

## 2024-06-19 NOTE — Progress Notes (Signed)
 Bilateral hip dressings changed and right foot.

## 2024-06-19 NOTE — TOC CM/SW Note (Signed)
 [facility] of [facility]) - Inpatient Brief Assessment   Patient Details  Name: Sabrina Hart MRN: [id number] Date of Birth: 07-Jan-1932  [facility] CM/SW Contact:    Lauraine FORBES Saa, LCSW Phone Number: 06/19/2024, 9:04 AM   Clinical Narrative:  9:04 AM Per chart review, patient has a PCP and insurance. Patient does not have SNF history. Patient has ALF history with [facility]. Patient has HH history with [facility]. Patient has DME (manual wheelchair) history. Patient's preferred pharmacy is [facility]. No TOC needs were identified at this time. TOC will continue to follow and be available to assist.  Transition of Care Asessment: Insurance and Status: Insurance coverage has been reviewed Patient has primary care physician: Yes Home environment has been reviewed: Private Residence Prior level of function:: N/A Prior/Current Home Services: No current home services (Has HH/DME history) Social Drivers of Health Review: SDOH reviewed no interventions necessary Readmission risk has been reviewed: Yes Transition of care needs: no transition of care needs at this time

## 2024-06-19 NOTE — Progress Notes (Signed)
 PHARMACY - PHYSICIAN COMMUNICATION CRITICAL VALUE ALERT - BLOOD CULTURE IDENTIFICATION (BCID)  Sabrina Hart is an 89 y.o. female who presented to [facility] on 06/18/2024 with a chief complaint of infected decubitus ulcer, right hip abscess and osteo of the   Assessment:  89 year old female admitted with right hip abscess and osteo of the right foot. Blood cultures with 1/4 staph species. Likely contaminant.   Name of physician (or Provider) Contacted: Pahwani  Current antibiotics: Vanc/Ceftriaxone   Changes to prescribed antibiotics recommended:  None based on this culture   Results for orders placed or performed during the hospital encounter of 06/18/24  Blood Culture ID Panel (Reflexed) (Collected: 06/18/2024 10:35 AM)  Result Value Ref Range   Enterococcus faecalis NOT DETECTED NOT DETECTED   Enterococcus Faecium NOT DETECTED NOT DETECTED   Listeria monocytogenes NOT DETECTED NOT DETECTED   Staphylococcus species DETECTED (A) NOT DETECTED   Staphylococcus aureus (BCID) NOT DETECTED NOT DETECTED   Staphylococcus epidermidis NOT DETECTED NOT DETECTED   Staphylococcus lugdunensis NOT DETECTED NOT DETECTED   Streptococcus species NOT DETECTED NOT DETECTED   Streptococcus agalactiae NOT DETECTED NOT DETECTED   Streptococcus pneumoniae NOT DETECTED NOT DETECTED   Streptococcus pyogenes NOT DETECTED NOT DETECTED   A.calcoaceticus-baumannii NOT DETECTED NOT DETECTED   Bacteroides fragilis NOT DETECTED NOT DETECTED   Enterobacterales NOT DETECTED NOT DETECTED   Enterobacter cloacae complex NOT DETECTED NOT DETECTED   Escherichia coli NOT DETECTED NOT DETECTED   Klebsiella aerogenes NOT DETECTED NOT DETECTED   Klebsiella oxytoca NOT DETECTED NOT DETECTED   Klebsiella pneumoniae NOT DETECTED NOT DETECTED   Proteus species NOT DETECTED NOT DETECTED   Salmonella species NOT DETECTED NOT DETECTED   Serratia marcescens NOT DETECTED NOT DETECTED   Haemophilus influenzae NOT DETECTED  NOT DETECTED   Neisseria meningitidis NOT DETECTED NOT DETECTED   Pseudomonas aeruginosa NOT DETECTED NOT DETECTED   Stenotrophomonas maltophilia NOT DETECTED NOT DETECTED   Candida albicans NOT DETECTED NOT DETECTED   Candida auris NOT DETECTED NOT DETECTED   Candida glabrata NOT DETECTED NOT DETECTED   Candida krusei NOT DETECTED NOT DETECTED   Candida parapsilosis NOT DETECTED NOT DETECTED   Candida tropicalis NOT DETECTED NOT DETECTED   Cryptococcus neoformans/gattii NOT DETECTED NOT DETECTED    Damien Quiet, PharmD, BCPS, BCIDP Infectious Diseases Clinical Pharmacist Phone: [phone] 06/19/2024  3:33 PM

## 2024-06-19 NOTE — Progress Notes (Signed)
 Patient more alert post dextrose  push IV.

## 2024-06-19 NOTE — Consult Note (Signed)
 WOC consult requested for multiple pressure injuries.  This was already performed; please refer to consult notes on 7/22 AM, and topical treatment orders have been provided for bedside nurses to perform.   Please re-consult if further assistance is needed.  Thank-you,  Stephane Fought MSN, RN, CWOCN, CWCN-AP, CNS Contact Mon-Fri 0700-1500: [phone]

## 2024-06-19 NOTE — Consult Note (Signed)
 WOC Nurse Consult Note: Dr. Harden has evaluated R hip and R foot; family does not desire surgery and prefers to try local wound care and offloading; see Harden note 06/18/2024 Reason for Consult: multiple wounds  Wound type: 1. R  medial foot (base of Great toe)  Unstageable PI gangrenous black soft eschar  2. R trochanter/hip unstageable Pressure Injury 80% black eschar 20% pink  3. L trochanter deep tissue pressure injury evolving with necrotic tissue  4. R lateral foot deep tissue pressure injury purple maroon discoloration  5.  R ankle deep tissue pressure injury purple maroon discoloration  6. Stage 1 Pressure Injury R knee  Pressure Injury POA: Yes Measurement: see nursing flowsheet  Wound bed: as above  Drainage (amount, consistency, odor) foul smelling to R hip and foot per MD note  Periwound: Dressing procedure/placement/frequency:  Cleanse R medial foot wound with Vashe wound cleanser, do not rinse and allow to air dry. Apply Vashe moistened gauze to wound bed daily, cover with dry gauze ABD pad.  Apply Xeroform gauze ([place] [phone]) to DTPI lateral foot and ankle, cover with ABD pad and wrap entire dressing with Kerlix roll gauze. Place B feet in prevalon boots Soila [phone]) to offload pressure  Cleanse B hip wounds with VAshe wound cleanser Soila [phone]) do not rinse and allow to air dry.  Apply 1/4 thick layer of Santyl  to wound bed, top with saline moist gauze, dry gauze, and ABD pad and tape.   Apply silicone foam to R knee Stage 1, lift to assess daily.   Patient should be placed on a low air loss mattress for pressure redistribution and moisture management.    Per Dr. Harden recommendation general surgery to assess necrotic R hip wound.  Any orders placed by surgery supercede orders placed by [facility] RN.   POC discussed with bedside nurse. WOC team will not follow. Re-consult if further needs arise.   Thank you,    Powell Bar MSN, RN-BC, [employer]

## 2024-06-19 NOTE — Progress Notes (Signed)
 Patient ID: Sabrina Hart, female   DOB: 26-Jun-1932, 89 y.o.   MRN: [id number] Patient seen in follow-up for right hip decubitus ulcer and gangrenous changes to the right foot.  Review of the MRI scan shows osteomyelitis of the first metatarsal head as well as an associated abscess.  The right hip is stable.  White cell count has dropped to 8.7.  Patient's family is at bedside.  Discussed that I will request general surgery for second opinion.  Will start Vashe dressing changes to the right hip and right foot.  Family states they are interested in not treating the osteomyelitis and abscess of the foot but are interested in treating surgically the hip wound.  I will request general surgery to weigh in on surgical treatment for the hip wound.  I do not feel the patient could safely undergo surgical debridement of the hip.

## 2024-06-19 NOTE — Progress Notes (Signed)
 Pharmacy Antibiotic Note  Sabrina Hart is a 89 y.o. female admitted on 06/18/2024 with chronic decubitus ulcer, R hip abscess, 1st toe OM.  Pharmacy has been consulted for vancomycin  dosing. Patient is also on ceftriaxone .  7/22 Vancomycin  1250mg  Q 48 hr with Est AUC: 480 Scr used: 0.8 mg/dL; Vd coeff: 0.72 L/kg  Plan: Increase vancomycin  to 1250mg  q48h hr  Continue ceftriaxone  Monitor cultures, clinical status, renal function, vancomycin  level Narrow abx as able and f/u duration    Height: 5' 3 (160 cm) Weight: 49.6 kg (109 lb 5.6 oz) IBW/kg (Calculated) : 52.4  Temp (24hrs), Avg:97.2 F (36.2 C), Min:96.3 F (35.7 C), Max:97.7 F (36.5 C)  Recent Labs  Lab 06/18/24 0940 06/18/24 0950 06/18/24 1141 06/19/24 0554  WBC 12.3*  --   --  8.7  CREATININE 1.15*  --   --  0.56  LATICACIDVEN  --  1.9 3.7*  --     Estimated Creatinine Clearance: 36.6 mL/min (by C-G formula based on SCr of 0.56 mg/dL).    Allergies  Allergen Reactions   Other Other (See Comments)    Metals; skin gets weepy; gets worse if it makes contact (09/28/2012)   Pollen Extract Other (See Comments)    sneezing; watery eyes   Latex Itching, Swelling and Other (See Comments)    Skin swells   Morphine And Codeine Rash and Other (See Comments)    Patient does not prefer to take these, but if absolutely necessary, accommodations can be made.   Nickel Other (See Comments)    Causes weepiness (skin)   Penicillins Rash    Has patient had a PCN reaction causing immediate rash, facial/tongue/throat swelling, SOB or lightheadedness with hypotension: Yes Has patient had a PCN reaction causing severe rash involving mucus membranes or skin necrosis: Unk Has patient had a PCN reaction that required hospitalization: No  Has patient had a PCN reaction occurring within the last 10 years: No If all of the above answers are NO, then may proceed with Cephalosporin use.     Antimicrobials this  admission: Vanc 7/21>>  Aztreonam  7/21?? 7/22 CTX 7/22 >>   Dose adjustments this admission: 7/22 Vancomycin  1g q48hr > 1250g q48hr  Microbiology results: 7/21 Bcx 1/4 GPC 7/21 Ucx  7/21 MRSA neg   Thank you for allowing pharmacy to be a part of this patient's care.  Jinnie Door, PharmD, BCPS, BCCP Clinical Pharmacist  Please check AMION for all [facility] Pharmacy phone numbers After 10:00 PM, call Main Pharmacy [phone]

## 2024-06-20 DIAGNOSIS — L03317 Cellulitis of buttock: Secondary | ICD-10-CM

## 2024-06-20 DIAGNOSIS — Z7189 Other specified counseling: Secondary | ICD-10-CM | POA: Diagnosis not present

## 2024-06-20 DIAGNOSIS — Z789 Other specified health status: Secondary | ICD-10-CM

## 2024-06-20 DIAGNOSIS — R6510 Systemic inflammatory response syndrome (SIRS) of non-infectious origin without acute organ dysfunction: Secondary | ICD-10-CM | POA: Diagnosis not present

## 2024-06-20 DIAGNOSIS — Z515 Encounter for palliative care: Secondary | ICD-10-CM | POA: Diagnosis not present

## 2024-06-20 DIAGNOSIS — Z66 Do not resuscitate: Secondary | ICD-10-CM

## 2024-06-20 DIAGNOSIS — A419 Sepsis, unspecified organism: Secondary | ICD-10-CM | POA: Diagnosis not present

## 2024-06-20 LAB — URINE CULTURE: Culture: NO GROWTH

## 2024-06-20 LAB — CBC WITH DIFFERENTIAL/PLATELET
Abs Immature Granulocytes: 0.05 K/uL (ref 0.00–0.07)
Basophils Absolute: 0 K/uL (ref 0.0–0.1)
Basophils Relative: 0 %
Eosinophils Absolute: 0 K/uL (ref 0.0–0.5)
Eosinophils Relative: 0 %
HCT: 30.4 % — ABNORMAL LOW (ref 36.0–46.0)
Hemoglobin: 10 g/dL — ABNORMAL LOW (ref 12.0–15.0)
Immature Granulocytes: 1 %
Lymphocytes Relative: 7 %
Lymphs Abs: 0.6 K/uL — ABNORMAL LOW (ref 0.7–4.0)
MCH: 28.4 pg (ref 26.0–34.0)
MCHC: 32.9 g/dL (ref 30.0–36.0)
MCV: 86.4 fL (ref 80.0–100.0)
Monocytes Absolute: 0.8 K/uL (ref 0.1–1.0)
Monocytes Relative: 9 %
Neutro Abs: 6.9 K/uL (ref 1.7–7.7)
Neutrophils Relative %: 83 %
Platelets: 296 K/uL (ref 150–400)
RBC: 3.52 MIL/uL — ABNORMAL LOW (ref 3.87–5.11)
RDW: 13.4 % (ref 11.5–15.5)
WBC: 8.3 K/uL (ref 4.0–10.5)
nRBC: 0 % (ref 0.0–0.2)

## 2024-06-20 LAB — BASIC METABOLIC PANEL WITH GFR
Anion gap: 11 (ref 5–15)
BUN: 12 mg/dL (ref 8–23)
CO2: 23 mmol/L (ref 22–32)
Calcium: 8.3 mg/dL — ABNORMAL LOW (ref 8.9–10.3)
Chloride: 106 mmol/L (ref 98–111)
Creatinine, Ser: 0.47 mg/dL (ref 0.44–1.00)
GFR, Estimated: >60 mL/min (ref >60)
Glucose, Bld: 125 mg/dL — ABNORMAL HIGH (ref 70–99)
Potassium: 3.7 mmol/L (ref 3.5–5.1)
Sodium: 140 mmol/L (ref 135–145)

## 2024-06-20 LAB — GLUCOSE, CAPILLARY
Glucose-Capillary: 128 mg/dL — ABNORMAL HIGH (ref 70–99)
Glucose-Capillary: 124 mg/dL — ABNORMAL HIGH (ref 70–99)
Glucose-Capillary: 95 mg/dL (ref 70–99)
Glucose-Capillary: 98 mg/dL (ref 70–99)
Glucose-Capillary: 165 mg/dL — ABNORMAL HIGH (ref 70–99)

## 2024-06-20 LAB — LACTIC ACID, PLASMA: Lactic Acid, Venous: 1.6 mmol/L (ref 0.5–1.9)

## 2024-06-20 MED ORDER — SODIUM CHLORIDE 0.9 % IV BOLUS
500.0000 mL | Freq: Once | INTRAVENOUS | Status: AC
  Administered 2024-06-20: 500 mL via INTRAVENOUS

## 2024-06-20 MED ORDER — DOXYCYCLINE HYCLATE 100 MG PO TABS
100.0000 mg | ORAL_TABLET | Freq: Two times a day (BID) | ORAL | Status: DC
  Administered 2024-06-22 – 2024-06-23 (×3): 100 mg via ORAL
  Filled 2024-06-20 (×3): qty 1

## 2024-06-20 MED ORDER — CEFADROXIL 500 MG PO CAPS
500.0000 mg | ORAL_CAPSULE | Freq: Two times a day (BID) | ORAL | Status: DC
  Administered 2024-06-20 – 2024-06-23 (×7): 500 mg via ORAL
  Filled 2024-06-20 (×8): qty 1

## 2024-06-20 NOTE — Progress Notes (Signed)
 PRN enema administered with no results.  Lonell LITTIE Lyme, RN

## 2024-06-20 NOTE — Progress Notes (Signed)
 PROGRESS NOTE    Zylie Mumaw  FMW:[id number] DOB: [birth year] DOA: 06/18/2024 PCP: [name], MD   Brief Narrative:  Eldine Rencher is a 89 y.o. female with medical history significant of hypertension, hyperlipidemia, hypothyroidism, CAD status post stent, dementia, glaucoma, DVT on Eliquis  presented with confusion, wound changes, constipation.   Patient has a chronic decubitus ulcer on her bilateral gluteal regions as well as her metatarsal regions.  She has not followed outpatient and wounds have been healing.  There has been some increased seepage after starting Eliquis  for DVT. Including bloody drainage.  There is also been some nosebleeding and some intermittent blood in the mouth in the morning possibly due to nosebleeding that is being swallowed/pooling in the mouth. Patient is wheelchair-bound at baseline.  Family also notes a decline in mentation and oral activity for the past several weeks as well.  CT abdomen pelvis showed crescentic abscess at the right hip measuring 12 x 2 x 12 cm, prominent stool favoring constipation, mesenteric edema unable to exclude small ascites, severe arthropathy of the hips possible synovitis in the right hip.  Right kidney lesion favoring a cyst but is indeterminant MRI recommended for follow-up versus CT with and without contrast. Patient received vancomycin , aztreonam  in the ED in the setting of penicillin allergy.  Orthopedics consulted.  Was also diagnosed with right foot abscess and osteomyelitis.  Details below.  Assessment & Plan:   Principal Problem:   SIRS (systemic inflammatory response syndrome) (HCC) Active Problems:   CAD (coronary artery disease)   Hypothyroidism   Hyperlipidemia   Severe late onset Alzheimer's dementia (HCC)   Essential hypertension   Primary open angle glaucoma (POAG) of both eyes   Pressure injury of right hip, unstageable (HCC)   Gangrene of right foot (HCC)   Pressure injury of skin  Severe sepsis  secondary to infected decubitus ulcers, right hip abscess and abscess as well as osteomyelitis of the first metatarsal head of the right foot, POA: Patient met sepsis criteria based on tachypnea and leukocytosis and lactic acid of 3.7. > CT abdomen pelvis showed crescentic abscess at the right hip measuring 12 x 2 x 12 cm, prominent stool favoring constipation, mesenteric edema unable to exclude small ascites, severe arthropathy of the hips possible synovitis in the right hip.  > Certainly has abscess at the right hip and concern for synovitis.  Lower extremity wounds with some infection.  Wound care consulted. Patient seen by Dr. Harden, plan for bilateral ABI.  Per him, patient would not be candidate for endovascular evaluation due to her flexion contractures.  And he did not feel that there is a surgical intervention that he could proceed with safely to debride the large decubitus ulcers on the right hip. Eventually MRI of the right foot showed Large area of soft tissue ulceration medial to the 1st metatarsophalangeal joint with underlying abscess and underlying osteomyelitis of the 1st metatarsal head, its sesamoids and base of the 1st proximal phalanx.  Patient started on vancomycin  and aztreonam  due to penicillin allergy however later transitioned to Rocephin  as per pharmacy, patient has history of tolerating penicillin in the past.  No fever.  Blood culture negative.  Lactic acid repeat is pending since yesterday, I have reordered today.  Family wants the wounds to be bribed but they do not want any amputation for the right foot.  Per my discussion with Dr. Harden, he is going to consult with general surgery about that.  Further management per orthopedics  and general surgery.  Acute septic encephalopathy, POA: Improved, she is at baseline.  Right kidney lesion favoring a cyst but is indeterminant MRI recommended for follow-up versus CT with and without contrast.  Calcified uterus.  Sensitivity lower due to  patient in the left lateral decubitus.  Hypoglycemia: 47 yesterday, started on D5 NS.  Blood sugar improving.  No plans for surgery so we will start her on the diet.  Constipation: MiraLAX  and Fleet enema and Colace ordered.  AKI: Likely ATN due to severe sepsis.  Resolved with IV fluids  Hypokalemia: Resolved.  Mild hyponatremia: Monitor.   Hypertension - Holding home amlodipine  in the setting of above, still hypotensive.   Hyperlipidemia - Atorvastatin  held outpatient   CAD > Status post stents - ContinueEliquis   Hypothyroidism - Continue home Synthroid    Glaucoma - Continue eyedrops once confirmed   History of DVT - Holding Eliquis  for now pending surgical evaluation.  Will likely resume after if okay with surgery colleagues.   Dementia - Continue home donepezil , risperidone , Ativan , sertraline  once confirmed  DVT prophylaxis: Place and maintain sequential compression device Start: 06/18/24 1339   Code Status: Limited: Do not attempt resuscitation (DNR) -DNR-LIMITED -Do Not Intubate/DNI   Family Communication: Patient's daughter present at bedside.  Plan of care discussed in length with her.    Status is: Inpatient Remains inpatient appropriate because: Needs to be seen by orthopedics for further plan of care.   Estimated body mass index is 19.37 kg/m as calculated from the following:   Height as of this encounter: 5' 3 (1.6 m).   Weight as of this encounter: 49.6 kg.    Nutritional Assessment: Body mass index is 19.37 kg/m.[name] Seen by dietician.  I agree with the assessment and plan as outlined below: Nutrition Status:   . Skin Assessment: I have examined the patient's skin and I agree with the wound assessment as performed by the wound care RN as outlined below:    Consultants:  Orthopedics  Procedures:  None so far  Antimicrobials:  Anti-infectives (From admission, onward)    Start     Dose/Rate Route Frequency Ordered Stop   06/20/24 1200   vancomycin  (VANCOCIN ) IVPB 1000 mg/200 mL premix  Status:  Discontinued        1,000 mg 200 mL/hr over 60 Minutes Intravenous Every 48 hours 06/18/24 1432 06/19/24 1540   06/20/24 0600  vancomycin  (VANCOREADY) IVPB 1250 mg/250 mL        1,250 mg 166.7 mL/hr over 90 Minutes Intravenous Every 48 hours 06/19/24 1540     06/19/24 1600  cefTRIAXone  (ROCEPHIN ) 2 g in sodium chloride  0.9 % 100 mL IVPB        2 g 200 mL/hr over 30 Minutes Intravenous Every 24 hours 06/19/24 1417     06/18/24 2000  aztreonam  (AZACTAM ) 1 g in sodium chloride  0.9 % 100 mL IVPB  Status:  Discontinued        1 g 200 mL/hr over 30 Minutes Intravenous Every 8 hours 06/18/24 1432 06/19/24 1417   06/18/24 1015  aztreonam  (AZACTAM ) 2 g in sodium chloride  0.9 % 100 mL IVPB        2 g 200 mL/hr over 30 Minutes Intravenous  Once 06/18/24 1008 06/18/24 1126   06/18/24 1015  vancomycin  (VANCOCIN ) IVPB 1000 mg/200 mL premix  Status:  Discontinued        1,000 mg 200 mL/hr over 60 Minutes Intravenous  Once 06/18/24 1008 06/18/24 1014   06/18/24  1015  vancomycin  (VANCOREADY) IVPB 1250 mg/250 mL        1,250 mg 166.7 mL/hr over 90 Minutes Intravenous  Once 06/18/24 1014 06/18/24 1321         Subjective: Seen and examined.  Patient alert and oriented to self which is her baseline.  Daughter tells me that she is more awake today.  Patient did not endorse any complaint.  Objective: Vitals:   06/19/24 1922 06/19/24 2300 06/19/24 2316 06/20/24 0400  BP: 105/77 (!) 90/38 (!) 103/45 109/77  Pulse: 85 76 78 90  Resp: 17 14 14 14   Temp:   99.1 F (37.3 C)   TempSrc:  Axillary Axillary   SpO2: 100% 100% 100% 100%  Weight:      Height:        Intake/Output Summary (Last 24 hours) at 06/20/2024 0817 Last data filed at 06/20/2024 0403 Gross per 24 hour  Intake 1264.63 ml  Output 200 ml  Net 1064.63 ml   Filed Weights   06/18/24 0936 06/18/24 1938  Weight: 52.2 kg 49.6 kg    Examination:  General exam: Appears calm and  comfortable  Respiratory system: Clear to auscultation. Respiratory effort normal. Cardiovascular system: S1 & S2 heard, RRR. No JVD, murmurs, rubs, gallops or clicks. No pedal edema. Gastrointestinal system: Abdomen is nondistended, soft and nontender. No organomegaly or masses felt. Normal bowel sounds heard. Central nervous system: Alert and oriented x 1.  No focal deficit but she has contractures in the lower extremities. Extremities: Symmetric 5 x 5 power. Skin: Wounds at bilateral hips.  Had dressing.  Data Reviewed: I have personally reviewed following labs and imaging studies  CBC: Recent Labs  Lab 06/18/24 0940 06/19/24 0554 06/20/24 0604  WBC 12.3* 8.7 8.3  NEUTROABS 10.6*  --  6.9  HGB 12.5 10.1* 10.0*  HCT 38.3 31.1* 30.4*  MCV 86.7 86.4 86.4  PLT 440* 371 296   Basic Metabolic Panel: Recent Labs  Lab 06/18/24 0940 06/19/24 0554 06/20/24 0604  NA 135 134* 140  K 3.4* 2.9* 3.7  CL 98 98 106  CO2 26 25 23   GLUCOSE 158* 89 125*  BUN 39* 22 12  CREATININE 1.15* 0.56 0.47  CALCIUM  9.0 8.5* 8.3*  MG 2.3  --   --    GFR: Estimated Creatinine Clearance: 36.6 mL/min (by C-G formula based on SCr of 0.47 mg/dL). Liver Function Tests: Recent Labs  Lab 06/18/24 0940 06/19/24 0554  AST 39 26  ALT 20 16  ALKPHOS 104 81  BILITOT 1.0 0.8  PROT 7.4 5.9*  ALBUMIN 2.3* 1.9*   No results for input(s): LIPASE, AMYLASE in the last 168 hours. No results for input(s): AMMONIA in the last 168 hours. Coagulation Profile: No results for input(s): INR, PROTIME in the last 168 hours. Cardiac Enzymes: No results for input(s): CKTOTAL, CKMB, CKMBINDEX, TROPONINI in the last 168 hours. BNP (last 3 results) No results for input(s): PROBNP in the last 8760 hours. HbA1C: No results for input(s): HGBA1C in the last 72 hours. CBG: Recent Labs  Lab 06/19/24 1632 06/19/24 1719 06/19/24 1935 06/19/24 2324 06/20/24 0412  GLUCAP 67* 94 79 97 128*    Lipid Profile: No results for input(s): CHOL, HDL, LDLCALC, TRIG, CHOLHDL, LDLDIRECT in the last 72 hours. Thyroid  Function Tests: No results for input(s): TSH, T4TOTAL, FREET4, T3FREE, THYROIDAB in the last 72 hours. Anemia Panel: No results for input(s): VITAMINB12, FOLATE, FERRITIN, TIBC, IRON, RETICCTPCT in the last 72 hours. Sepsis Labs: Recent  Labs  Lab 06/18/24 0950 06/18/24 1141  LATICACIDVEN 1.9 3.7*    Recent Results (from the past 240 hours)  Urine Culture     Status: None (Preliminary result)   Collection Time: 06/18/24  1:21 AM   Specimen: Urine, Random  Result Value Ref Range Status   Specimen Description URINE, RANDOM  Final   Special Requests   Final    URINE, CLEAN CATCH Performed at [facility], [address], [place], [place] [address]    Culture PENDING  Incomplete   Report Status PENDING  Incomplete  Culture, blood (routine x 2)     Status: None (Preliminary result)   Collection Time: 06/18/24 10:30 AM   Specimen: BLOOD RIGHT ARM  Result Value Ref Range Status   Specimen Description BLOOD RIGHT ARM  Final   Special Requests   Final    BOTTLES DRAWN AEROBIC AND ANAEROBIC Blood Culture adequate volume   Culture   Final    NO GROWTH 1 DAY Performed at [facility], [address] [place], [place] [address]    Report Status PENDING  Incomplete  Culture, blood (routine x 2)     Status: None (Preliminary result)   Collection Time: 06/18/24 10:35 AM   Specimen: BLOOD LEFT ARM  Result Value Ref Range Status   Specimen Description BLOOD LEFT ARM  Final   Special Requests   Final    BOTTLES DRAWN AEROBIC AND ANAEROBIC Blood Culture adequate volume   Culture  Setup Time   Final    GRAM POSITIVE COCCI IN CLUSTERS ANAEROBIC BOTTLE ONLY CRITICAL RESULT CALLED TO, READ BACK BY AND VERIFIED WITH: PHARMD EMILY SINCLAIR ON 06/19/24 @ 1516 BY DRT Performed at [facility], [address] [place], [place]  [address]    Culture GRAM POSITIVE COCCI IN CLUSTERS  Final   Report Status PENDING  Incomplete  Blood Culture ID Panel (Reflexed)     Status: Abnormal   Collection Time: 06/18/24 10:35 AM  Result Value Ref Range Status   Enterococcus faecalis NOT DETECTED NOT DETECTED Final   Enterococcus Faecium NOT DETECTED NOT DETECTED Final   Listeria monocytogenes NOT DETECTED NOT DETECTED Final   Staphylococcus species DETECTED (A) NOT DETECTED Final    Comment: CRITICAL RESULT CALLED TO, READ BACK BY AND VERIFIED WITH: PHARMD EMILY SINCLAIR ON 06/19/24 @ 1516 BY DRT    Staphylococcus aureus (BCID) NOT DETECTED NOT DETECTED Final   Staphylococcus epidermidis NOT DETECTED NOT DETECTED Final   Staphylococcus lugdunensis NOT DETECTED NOT DETECTED Final   Streptococcus species NOT DETECTED NOT DETECTED Final   Streptococcus agalactiae NOT DETECTED NOT DETECTED Final   Streptococcus pneumoniae NOT DETECTED NOT DETECTED Final   Streptococcus pyogenes NOT DETECTED NOT DETECTED Final   A.calcoaceticus-baumannii NOT DETECTED NOT DETECTED Final   Bacteroides fragilis NOT DETECTED NOT DETECTED Final   Enterobacterales NOT DETECTED NOT DETECTED Final   Enterobacter cloacae complex NOT DETECTED NOT DETECTED Final   Escherichia coli NOT DETECTED NOT DETECTED Final   Klebsiella aerogenes NOT DETECTED NOT DETECTED Final   Klebsiella oxytoca NOT DETECTED NOT DETECTED Final   Klebsiella pneumoniae NOT DETECTED NOT DETECTED Final   Proteus species NOT DETECTED NOT DETECTED Final   Salmonella species NOT DETECTED NOT DETECTED Final   Serratia marcescens NOT DETECTED NOT DETECTED Final   Haemophilus influenzae NOT DETECTED NOT DETECTED Final   Neisseria meningitidis NOT DETECTED NOT DETECTED Final   Pseudomonas aeruginosa NOT DETECTED NOT DETECTED Final  Stenotrophomonas maltophilia NOT DETECTED NOT DETECTED Final   Candida albicans NOT DETECTED NOT DETECTED Final   Candida auris NOT DETECTED NOT DETECTED Final    Candida glabrata NOT DETECTED NOT DETECTED Final   Candida krusei NOT DETECTED NOT DETECTED Final   Candida parapsilosis NOT DETECTED NOT DETECTED Final   Candida tropicalis NOT DETECTED NOT DETECTED Final   Cryptococcus neoformans/gattii NOT DETECTED NOT DETECTED Final    Comment: Performed at [facility], [address], [place], [place] [address]  MRSA Next Gen by PCR, Nasal     Status: None   Collection Time: 06/18/24  8:47 PM   Specimen: Nasal Mucosa; Nasal Swab  Result Value Ref Range Status   MRSA by PCR Next Gen NOT DETECTED NOT DETECTED Final    Comment: (NOTE) The GeneXpert MRSA Assay (FDA approved for NASAL specimens only), is one component of a comprehensive MRSA colonization surveillance program. It is not intended to diagnose MRSA infection nor to guide or monitor treatment for MRSA infections. Test performance is not FDA approved in patients less than 36 years old. Performed at [facility], [address], [place], [place] [address]      Radiology Studies: VAS US  ABI WITH/WO TBI Result Date: 06/19/2024  LOWER EXTREMITY DOPPLER STUDY Patient Name:  Aigner Horseman  Date of Exam:   06/19/2024 Medical Rec #: [id number]           Accession #:    [id number] Date of Birth: [birth year]            Patient Gender: F Patient Age:   61 years Exam Location:  [facility] Procedure:      VAS US  ABI WITH/WO TBI Referring Phys: MICHAEL JEFFERY --------------------------------------------------------------------------------  Indications: Ulceration.  Limitations: Today's exam was limited due to patient positioning and patient              unable to lie flat. Performing Technologist: Jimmye Scarce RVT  Examination Guidelines: A complete evaluation includes at minimum, Doppler waveform signals and systolic blood pressure reading at the level of bilateral brachial, anterior tibial, and posterior tibial arteries, when vessel segments are accessible. Bilateral testing is  considered an integral part of a complete examination. Photoelectric Plethysmograph (PPG) waveforms and toe systolic pressure readings are included as required and additional duplex testing as needed. Limited examinations for reoccurring indications may be performed as noted.  ABI Findings: +--------+------------------+-----+----------+--------+ Right   Rt Pressure (mmHg)IndexWaveform  Comment  +--------+------------------+-----+----------+--------+ Brachial                                 IV       +--------+------------------+-----+----------+--------+ PTA     80                0.76 monophasic         +--------+------------------+-----+----------+--------+ DP      78                0.74 monophasic         +--------+------------------+-----+----------+--------+ +--------+------------------+-----+--------+------------------+ Left    Lt Pressure (mmHg)IndexWaveformComment            +--------+------------------+-----+--------+------------------+ [id number]                                               +--------+------------------+-----+--------+------------------+  PTA                                    pt has contracture +--------+------------------+-----+--------+------------------+ DP      106               1.01 biphasic                   +--------+------------------+-----+--------+------------------+ +-------+-----------+-----------+------------+------------+ ABI/TBIToday's ABIToday's TBIPrevious ABIPrevious TBI +-------+-----------+-----------+------------+------------+ Right  0.76                                           +-------+-----------+-----------+------------+------------+ Left   1.01                                           +-------+-----------+-----------+------------+------------+  Summary: Right: Resting right ankle-brachial index indicates moderate right lower extremity arterial disease. Left: Resting left ankle-brachial index is  within normal range. *See table(s) above for measurements and observations.  Electronically signed by Norman Serve on 06/19/2024 at 5:14:14 PM.    Final    MR FOOT RIGHT W WO CONTRAST Result Date: 06/19/2024 CLINICAL DATA:  Soft tissue infection suspected, foot, xray done EXAM: MRI OF THE RIGHT FOREFOOT WITHOUT AND WITH CONTRAST TECHNIQUE: Multiplanar, multisequence MR imaging of the right forefoot was performed before and after the administration of intravenous contrast. CONTRAST:  4mL GADAVIST  GADOBUTROL  1 MMOL/ML IV SOLN COMPARISON:  None Available. FINDINGS: Technical note: Despite efforts by the technologist and patient, mild-to-moderate motion artifact is present on today's exam and could not be eliminated. This reduces exam sensitivity and specificity. Bones/Joint/Cartilage There is a large area of soft tissue ulceration medial to the 1st metatarsophalangeal joint, further described below. There are underlying marrow changes within the 1st metatarsal head, its sesamoids and the base of the 1st proximal phalanx, highly suspicious for osteomyelitis. Specifically, there is decreased T1 signal, T2 hyperintensity and heterogeneous enhancement following contrast. There is apparent cortical destruction involving the medial head of the 1st metatarsal T2 hyperintensity extends proximally into the mid shaft of the 1st metatarsal. There is a small effusion of the 1st metatarsophalangeal joint with associated synovial enhancement following contrast. No suspicious marrow signal abnormalities or enhancement elsewhere in the forefoot. Mild midfoot and 2nd metatarsophalangeal joint degenerative changes. Ligaments Intact Lisfranc ligament. The medial collateral ligament the 1st metatarsophalangeal joint is not well visualized. The additional collateral ligaments appear intact. Muscles and Tendons Mild generalized muscular atrophy and edema with low level muscular enhancement medially. No discrete intramuscular fluid  collections are identified. The forefoot tendons appear intact without significant tenosynovitis. Soft tissues As above, large area of soft tissue ulceration medial to the 1st metatarsophalangeal joint within underlying heterogeneous, peripherally enhancing fluid collection measuring approximately 2.2 x 2.6 x 1.7 cm, suspicious for an abscess. This abuts the 1st metatarsophalangeal joint and is associated with adjacent synovial enhancement and marrow changes consistent with osteomyelitis, as described above. Generalized subcutaneous edema throughout the forefoot without other focal fluid collection. IMPRESSION: 1. Large area of soft tissue ulceration medial to the 1st metatarsophalangeal joint with underlying abscess and underlying osteomyelitis of the 1st metatarsal head, its sesamoids and base of the 1st proximal phalanx. 2. The abscess abuts the 1st metatarsophalangeal joint  and is associated with a small effusion and synovial enhancement, suspicious for septic arthritis. 3. No evidence of osteomyelitis elsewhere in the forefoot. 4. Generalized subcutaneous edema throughout the forefoot without other focal fluid collection. Electronically Signed   By: Elsie Perone M.D.   On: 06/19/2024 08:53   CT ABDOMEN PELVIS W CONTRAST Result Date: 06/18/2024 CLINICAL DATA:  Bowel obstruction EXAM: CT ABDOMEN AND PELVIS WITH CONTRAST TECHNIQUE: Multidetector CT imaging of the abdomen and pelvis was performed using the standard protocol following bolus administration of intravenous contrast. RADIATION DOSE REDUCTION: This exam was performed according to the departmental dose-optimization program which includes automated exposure control, adjustment of the mA and/or kV according to patient size and/or use of iterative reconstruction technique. CONTRAST:  75mL OMNIPAQUE  IOHEXOL  350 MG/ML SOLN COMPARISON:  None Available. FINDINGS: The patient was imaged in a contracted position laying on her left side, resulting in some  shifting structures. 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 Descending thoracic aortic and right coronary artery atherosclerosis. The left lung base is excluded. Hepatobiliary: Unremarkable Pancreas: Unremarkable Spleen: Unremarkable where included Adrenals/Urinary Tract: 2.2 cm homogeneous right kidney lower pole lesion favoring a cyst but with internal density of 44 Hounsfield units on portal venous phase images. This is technically indeterminate for cyst versus mass. In most clinical circumstances, renal protocol MRI or CT with and without contrast would be recommended for follow up of this lesion, although correlation with the patient's overall clinical scenario is recommended. Adrenal glands unremarkable. No hydronephrosis or hydroureter. Urinary bladder unremarkable. Stomach/Bowel: Prominent stool throughout the colon favors constipation. No definite dilated small bowel loops are identified, although bowel loops are relatively indistinct in portions of the abdomen. Vascular/Lymphatic: Atherosclerosis is present, including aortoiliac atherosclerotic disease. Substantial atheromatous vascular plaque at the origin of the celiac trunk and SMA contributing to potentially high-grade stenosis although both vessels appear to opacify and accordingly are likely not completely occluded. There is additional plaque further distally in the SMA. Reproductive: Calcified uterine fibroids. Other: Mesenteric edema. Difficult to exclude a small amount of ascites. Musculoskeletal: Suspected abscess in the subcutaneous tissues lateral to the right hip measuring about 12.0 by 2.0 by 12.0 cm (volume = 150 cm^3). Subcutaneous edema in the right proximal thigh posteriorly Severe arthropathy of the hips, right greater than left, with volume loss in the femoral heads. Flattening and spurring of the right acetabulum with potential erosions. Possible synovitis in the right  hip joint. Degenerative arthropathy of the right elbow which is included in the field of imaging. IMPRESSION: 1. Crescentic abscess in the subcutaneous tissues lateral to the right hip measuring about 12.0 by 2.0 by 12.0 cm (volume = 150 cc). 2. Prominent stool throughout the colon favors constipation. 3. Mesenteric edema. Difficult to exclude a small amount of ascites. 4. Severe arthropathy of the hips, right greater than left, with volume loss in the femoral heads. Flattening and spurring of the right acetabulum with potential erosions. Possible synovitis in the right hip joint. 5. 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 6. 2.2 cm homogeneous right kidney lower pole lesion favoring a cyst but with internal density of 44 Hounsfield units on portal venous phase images. This is technically indeterminate for cyst versus mass. In most clinical circumstances, renal protocol MRI or CT with and without contrast would be recommended for follow up of this lesion, although correlation with the patient's overall clinical scenario is recommended. 7. Calcified  uterine fibroids. 8. Aortic Atherosclerosis (ICD10-I70.0). Notable atherosclerosis proximally in the celiac trunk and SMA with substantial stenosis although no overt occlusion. 9. Reduced diagnostic sensitivity and specificity related to patient's contracted state and left side down lateral decubitus positioning during imaging. Electronically Signed   By: Ryan Salvage M.D.   On: 06/18/2024 13:07    Scheduled Meds:  acetaminophen   1,000 mg Oral Q6H   collagenase    Topical Daily   docusate sodium   100 mg Oral BID   donepezil   5 mg Oral QHS   levothyroxine   88 mcg Oral Daily   polyethylene glycol  17 g Oral BID   sodium chloride  flush  3 mL Intravenous Q12H   Continuous Infusions:  cefTRIAXone  (ROCEPHIN )  IV Stopped (06/19/24 1552)   dextrose  5 % and 0.9 % NaCl with KCl 20 mEq/L 75 mL/hr at 06/20/24  0403   vancomycin  1,250 mg (06/20/24 0559)     LOS: 2 days   Fredia Skeeter, MD [facility]  06/20/2024, 8:17 AM   *Please note that this is a verbal dictation therefore any spelling or grammatical errors are due to the Dragon Medical One system interpretation.  Please page via Amion and do not message via secure chat for urgent patient care matters. Secure chat can be used for non urgent patient care matters.  How to contact the [facility] Attending or Consulting provider 7A - 7P or covering provider during after hours 7P -7A, for this patient?  Check the care team in [facility] and look for a) attending/consulting [facility] provider listed and b) the [facility] team listed. Page or secure chat 7A-7P. Log into [email or web address] and use [facility] universal password to access. If you do not have the password, please contact the hospital operator. Locate the [facility] provider you are looking for under [facility] and page to a number that you can be directly reached. If you still have difficulty reaching the provider, please page the [facility] (Director on Call) for the Hospitalists listed on amion for assistance.

## 2024-06-20 NOTE — Consult Note (Cosign Needed Addendum)
 Palliative Care Consult Note                                  Date: 06/20/2024   Patient Name: Sabrina Hart  DOB: [birth year]  MRN: [id number]  Age / Sex: 89 y.o., female  PCP: [name], MD Referring Physician: Vernon Ranks, MD  Reason for Consultation: Establishing goals of care  Past Medical History:  Diagnosis Date   Anginal pain [facility])    1990's; 09/28/2012   Arthritis    very mild (09/28/2012)   Coronary artery disease 09/27/2012   s/p PCI of the RCA with DES with remote PCI in [place] 15 to 20 years ago. 2. cath 02/2013 patent mid RCA stent, 30% left main, 70% mid to distaal tanden 70% lesions x 3, 60% mid diag, 50% ostial LCx, 40% mid LCx.    Glaucoma    Hyperlipidemia    Hypothyroidism     Subjective:   This NP Camellia Kays reviewed medical records, received report from team, assessed the patient and then meet at the patient's bedside to discuss diagnosis, prognosis, GOC, EOL wishes disposition and options.  Before meeting with the patient/family, I spent time reviewing the chart including RN notes from yesterday, orthopedic note from yesterday, family medicine/hospitalist note from today. Essentially the patient was admitted with SIRS and severe sepsis secondary to infected decubitus ulcers, right hip abscess, osteomyelitis of the first metatarsal head of the right foot.  Orthopedic surgery was consulted.  Family is not interested in treating the osteomyelitis and abscess of the foot but are interested in treating the hip wound.  Orthopedics is requesting general surgery as a second opinion as they do not feel the patient can safely undergo surgical debridement of the hip.  Ongoing treatment for sepsis and encephalopathy.  Reviewed labs including CBC today which shows no leukocytosis and white blood cell count at 8.3, mild but stable anemia with hemoglobin of 10.0, BMP with normal electrolytes, normal kidney  function with creatinine of 0.47.  Lactic acid was significantly elevated 2 days ago at 3.7 but is improved to 1.6 today.  I met with the patient at bedside, although she is sleeping and I elected to not wake her as per nursing flowsheets patient is only oriented to person and unlikely to be able to meaningfully participate in goals of care conversation without family assistance.  No family was present.  Patient/Family Understanding of Illness: Deferred  Life Review: Deferred  Patient Values: Deferred  Goals: Deferred  Today's Discussion: Today is unable to engage with the patient due to altered mental status and sleeping.  I attempted to call her daughter Sabrina Hart and left a voicemail for call back in order to at schedule a family meeting to explore role for palliative support and engage in goals of care conversation.   I did debrief with the nurse and medical team.  **ADDENDUM: shortly after I left a voicemail, daughter PCP called back.  We discussed role of palliative medicine in helping to support the patient and family, discussed goals of care, help family with setting limits that she is treatments they are willing to undergo versus treatments they are averse to.  I updated her on some clinical details today.  We set a time to meet tomorrow at 10:30 in the morning at the patient's bedside for goals of care conversation.**  Review of Systems  Unable to perform ROS  Objective:   Primary Diagnoses: Present on Admission:  Hypothyroidism  Primary open angle glaucoma (POAG) of both eyes  Severe late onset Alzheimer's dementia (HCC)  Hyperlipidemia  Essential hypertension  CAD (coronary artery disease)  SIRS (systemic inflammatory response syndrome) (HCC)   Vital Signs:  BP (!) 125/54   Pulse 83   Temp 97.9 F (36.6 C) (Oral)   Resp 16   Ht 5' 3 (1.6 m)   Wt 49.6 kg   SpO2 100%   BMI 19.37 kg/m   Physical Exam Vitals and nursing note reviewed.   Constitutional:      General: She is sleeping. She is not in acute distress. Cardiovascular:     Rate and Rhythm: Normal rate.  Pulmonary:     Effort: Pulmonary effort is normal. No respiratory distress.  Abdominal:     General: Abdomen is flat. There is no distension.  Skin:    General: Skin is warm and dry.     Palliative Assessment/Data: 40%   Existing Vynca/ACP Documentation: MOST form signed/08/2023  Assessment & Plan:   HPI/Patient Profile: 89 y.o. female  with past medical history of hypertension, hyperlipidemia, hypothyroidism, CAD status post stent, dementia, glaucoma, DVT on Eliquis  presented with confusion, wound changes, constipation.  She was admitted on 06/18/2024 with SIRS, severe sepsis secondary to infected decubitus ulcers, right hip abscess, osteomyelitis of the first metatarsal head of the right foot, acute septic encephalopathy, constipation, AKI, and others.   Palliative medicine was consulted for GOC conversations.  SUMMARY OF RECOMMENDATIONS   DNR decimated Continue current scope of care until goals of care conversation Will meet with daughter Sabrina Hart tomorrow at 10:30 am Palliative medicine continue to follow  Symptom Management:  Per primary team PT is available to assist  Code Status: DNR-limited  Prognosis:  Unable to determine  Discharge Planning:  To Be Determined   Discussed with: Medical team, nursing team    Thank you for allowing us  to participate in the care of [facility] PMT will continue to support holistically.  Billing based on MDM: Moderate   Detailed review of medical records (labs, imaging, vital signs), medically appropriate exam, discussed with treatment team, counseling and education to patient, family, & staff, documenting clinical information, medication management, coordination of care  Signed by: Camellia Kays, NP Palliative Medicine Team  Team Phone # [phone] (Nights/Weekends)  06/20/2024, 4:10 PM

## 2024-06-20 NOTE — Consult Note (Signed)
 Sabrina Hart 10-Mar-1932  [id number].    Requesting MD: Dr. Jerona Sage Chief Complaint/Reason for Consult: Decubitus ulcer  HPI: Sabrina Hart is a 89 y.o. female who we are asked to see for right hip decubitus ulcer.  History of limited as patient has dementia and there is no family at bedside.  History obtained from chart review.  Patient is wheelchair-bound at baseline.  Per notes family has noted a decline in mentation or activity in the past several weeks.  Patient has fixed flexion contractures of the hips and knees bilaterally.  Sabrina Hart a CT scan that showed a crescent abscess in subcutaneous tissue lateral to the right hip measuring 12 x 2 x 12 cm with severe arthropathic changes of the hip with possible synovitis. Orthopedics was consulted.  She was also found to have decubitus ulcers of the right foot.  An MRI was obtained that showed ulceration medial to the first metatarsal phalangeal joint with underlying abscess and osteo-.  She was started on antibiotics.  Ortho did not feel there is any surgical intervention to safely debride the large decubitus ulcer on the right hip and that wound care and pressure offloading would be the safest opinion.  They asked general surgery for a second opinion.  Patient is currently afebrile with a normal WBC.  Lactic within normal limits.   Past Medical History:  hypertension, hyperlipidemia, hypothyroidism, CAD status post stent, dementia, glaucoma, DVT on Eliquis   Blood Thinners:Eliquis . On hold since admission  ROS: ROS As above, see hpi  History reviewed. No pertinent family history.  Past Medical History:  Diagnosis Date   Anginal pain [facility]    1990's; 09/28/2012   Arthritis    very mild (09/28/2012)   Coronary artery disease 09/27/2012   s/p PCI of the RCA with DES with remote PCI in [place] 15 to 20 years ago. 2. cath 02/2013 patent mid RCA stent, 30% left main, 70% mid to distaal tanden 70% lesions x 3, 60% mid  diag, 50% ostial LCx, 40% mid LCx.    Glaucoma    Hyperlipidemia    Hypothyroidism     Past Surgical History:  Procedure Laterality Date   CARDIAC CATHETERIZATION  1990s   PCI   CATARACT EXTRACTION W/ INTRAOCULAR LENS  IMPLANT, BILATERAL  1970's   CORONARY ANGIOPLASTY WITH STENT PLACEMENT  09/28/2012   20-30% diffuse LAD stenosis, mild-mod D1 dz, 70% distal D1 lesion, mild diffuse LCx dz, 50-60% OM1 stenosis, 60% mid & 90% mid-dis RCA stenosis s/p DES; LVEF 75-80% w/ hyperdynamic fxn.    DENTAL SURGERY     implants   INNER EAR SURGERY  ~ 2010   put plugs in so I could fly (09/28/2012)   LEFT HEART CATHETERIZATION WITH CORONARY ANGIOGRAM N/A 09/28/2012   Procedure: LEFT HEART CATHETERIZATION WITH CORONARY ANGIOGRAM;  Surgeon: Aleene JINNY Passe, MD;  Location: [facility] CATH LAB;  Service: Cardiovascular;  Laterality: N/A;   LUMBAR DISC SURGERY  1980's   took out a little piece (09/28/2012)   PERCUTANEOUS CORONARY STENT INTERVENTION (PCI-S)  09/28/2012   Procedure: PERCUTANEOUS CORONARY STENT INTERVENTION (PCI-S);  Surgeon: Aleene JINNY Passe, MD;  Location: [facility] CATH LAB;  Service: Cardiovascular;;   REDUCTION MAMMAPLASTY  1970's    Social History:  reports that she has never smoked. She has never used smokeless tobacco. She reports current alcohol use of about 1.0 standard drink of alcohol per week. She reports that she does not use drugs.  Allergies:  Allergies  Allergen Reactions   Other Other (See Comments)    Metals; skin gets weepy; gets worse if it makes contact (09/28/2012)   Pollen Extract Other (See Comments)    sneezing; watery eyes   Latex Itching, Swelling and Other (See Comments)    Skin swells   Morphine And Codeine Rash and Other (See Comments)    Patient does not prefer to take these, but if absolutely necessary, accommodations can be made.   Nickel Other (See Comments)    Causes weepiness (skin)   Penicillins Rash    Tolerates ceftriaxone  and cefadroxil      Medications Prior to Admission  Medication Sig Dispense Refill   amLODipine  (NORVASC ) 5 MG tablet Take 1 tablet (5 mg total) by mouth daily. 90 tablet 3   apixaban  (ELIQUIS ) 5 MG TABS tablet Take 1 tablet (5 mg total) by mouth 2 (two) times daily. (Patient taking differently: Take 5 mg by mouth in the morning.) 60 tablet 1   levothyroxine  (SYNTHROID ) 88 MCG tablet Take 1 tablet (88 mcg total) by mouth every morning. 90 tablet 3   magnesium oxide (MAG-OX) 400 (240 Mg) MG tablet Take 400 mg by mouth daily.     risperiDONE  (RISPERDAL ) 0.5 MG tablet TAKE 1 TABLET BY MOUTH 2 TIMES DAILY. 180 tablet 0   sertraline  (ZOLOFT ) 25 MG tablet Take 1 tablet (25 mg total) by mouth daily. 90 tablet 1   SIMBRINZA 1-0.2 % SUSP Place 1 drop into the left eye in the morning and at bedtime.     Incontinence Supply Disposable (PROCARE ADULT BRIEFS X-LARGE) MISC Use as directed. 50 each 11     Physical Exam: Blood pressure (!) 125/54, pulse 83, temperature 97.9 F (36.6 C), temperature source Oral, resp. rate 16, height 5' 3 (1.6 m), weight 49.6 kg, SpO2 100%. General: Frail elderly female lying in bed MS: Lower extremity contractures noted Skin: R hip decubitus ulcer with overlying eschar. No surrounding erythema, heat, or induration. Central aspect of eschar boggy with foul smelling cloudy fluid draining through a pinpoint hole     Results for orders placed or performed during the hospital encounter of 06/18/24 (from the past 48 hours)  MRSA Next Gen by PCR, Nasal     Status: None   Collection Time: 06/18/24  8:47 PM   Specimen: Nasal Mucosa; Nasal Swab  Result Value Ref Range   MRSA by PCR Next Gen NOT DETECTED NOT DETECTED    Comment: (NOTE) The GeneXpert MRSA Assay (FDA approved for NASAL specimens only), is one component of a comprehensive MRSA colonization surveillance program. It is not intended to diagnose MRSA infection nor to guide or monitor treatment for MRSA infections. Test  performance is not FDA approved in patients less than 35 years old. Performed at [facility], [address], [place], [place] [address]   Glucose, capillary     Status: None   Collection Time: 06/19/24  5:33 AM  Result Value Ref Range   Glucose-Capillary 76 70 - 99 mg/dL    Comment: Glucose reference range applies only to samples taken after fasting for at least 8 hours.  Comprehensive metabolic panel     Status: Abnormal   Collection Time: 06/19/24  5:54 AM  Result Value Ref Range   Sodium 134 (L) 135 - 145 mmol/L   Potassium 2.9 (L) 3.5 - 5.1 mmol/L   Chloride 98 98 - 111 mmol/L   CO2 25 22 - 32 mmol/L   Glucose, Bld 89 70 -  99 mg/dL    Comment: Glucose reference range applies only to samples taken after fasting for at least 8 hours.   BUN 22 8 - 23 mg/dL   Creatinine, Ser 9.43 0.44 - 1.00 mg/dL   Calcium  8.5 (L) 8.9 - 10.3 mg/dL   Total Protein 5.9 (L) 6.5 - 8.1 g/dL   Albumin 1.9 (L) 3.5 - 5.0 g/dL   AST 26 15 - 41 U/L   ALT 16 0 - 44 U/L   Alkaline Phosphatase 81 38 - 126 U/L   Total Bilirubin 0.8 0.0 - 1.2 mg/dL   GFR, Estimated >39 >39 mL/min    Comment: (NOTE) Calculated using the CKD-EPI Creatinine Equation (2021)    Anion gap 11 5 - 15    Comment: Performed at [facility], [address] [place], [place] [address]  CBC     Status: Abnormal   Collection Time: 06/19/24  5:54 AM  Result Value Ref Range   WBC 8.7 4.0 - 10.5 K/uL   RBC 3.60 (L) 3.87 - 5.11 MIL/uL   Hemoglobin 10.1 (L) 12.0 - 15.0 g/dL   HCT 68.8 (L) 63.9 - 53.9 %   MCV 86.4 80.0 - 100.0 fL   MCH 28.1 26.0 - 34.0 pg   MCHC 32.5 30.0 - 36.0 g/dL   RDW 86.6 88.4 - 84.4 %   Platelets 371 150 - 400 K/uL   nRBC 0.0 0.0 - 0.2 %    Comment: Performed at [facility], [address], [place], [place] [address]  Glucose, capillary     Status: None   Collection Time: 06/19/24  7:10 AM  Result Value Ref Range   Glucose-Capillary 78 70 - 99 mg/dL    Comment: Glucose reference range  applies only to samples taken after fasting for at least 8 hours.  Glucose, capillary     Status: Abnormal   Collection Time: 06/19/24 11:29 AM  Result Value Ref Range   Glucose-Capillary 47 (L) 70 - 99 mg/dL    Comment: Glucose reference range applies only to samples taken after fasting for at least 8 hours.  Glucose, capillary     Status: Abnormal   Collection Time: 06/19/24  4:32 PM  Result Value Ref Range   Glucose-Capillary 67 (L) 70 - 99 mg/dL    Comment: Glucose reference range applies only to samples taken after fasting for at least 8 hours.  Glucose, capillary     Status: None   Collection Time: 06/19/24  5:19 PM  Result Value Ref Range   Glucose-Capillary 94 70 - 99 mg/dL    Comment: Glucose reference range applies only to samples taken after fasting for at least 8 hours.  Glucose, capillary     Status: None   Collection Time: 06/19/24  7:35 PM  Result Value Ref Range   Glucose-Capillary 79 70 - 99 mg/dL    Comment: Glucose reference range applies only to samples taken after fasting for at least 8 hours.  Glucose, capillary     Status: None   Collection Time: 06/19/24 11:24 PM  Result Value Ref Range   Glucose-Capillary 97 70 - 99 mg/dL    Comment: Glucose reference range applies only to samples taken after fasting for at least 8 hours.  Glucose, capillary     Status: Abnormal   Collection Time: 06/20/24  4:12 AM  Result Value Ref Range   Glucose-Capillary 128 (H) 70 - 99 mg/dL    Comment: Glucose reference range applies  only to samples taken after fasting for at least 8 hours.  CBC with Differential/Platelet     Status: Abnormal   Collection Time: 06/20/24  6:04 AM  Result Value Ref Range   WBC 8.3 4.0 - 10.5 K/uL   RBC 3.52 (L) 3.87 - 5.11 MIL/uL   Hemoglobin 10.0 (L) 12.0 - 15.0 g/dL   HCT 69.5 (L) 63.9 - 53.9 %   MCV 86.4 80.0 - 100.0 fL   MCH 28.4 26.0 - 34.0 pg   MCHC 32.9 30.0 - 36.0 g/dL   RDW 86.5 88.4 - 84.4 %   Platelets 296 150 - 400 K/uL   nRBC 0.0  0.0 - 0.2 %   Neutrophils Relative % 83 %   Neutro Abs 6.9 1.7 - 7.7 K/uL   Lymphocytes Relative 7 %   Lymphs Abs 0.6 (L) 0.7 - 4.0 K/uL   Monocytes Relative 9 %   Monocytes Absolute 0.8 0.1 - 1.0 K/uL   Eosinophils Relative 0 %   Eosinophils Absolute 0.0 0.0 - 0.5 K/uL   Basophils Relative 0 %   Basophils Absolute 0.0 0.0 - 0.1 K/uL   Immature Granulocytes 1 %   Abs Immature Granulocytes 0.05 0.00 - 0.07 K/uL    Comment: Performed at [facility], [address] [place], [place] [address]  Basic metabolic panel     Status: Abnormal   Collection Time: 06/20/24  6:04 AM  Result Value Ref Range   Sodium 140 135 - 145 mmol/L   Potassium 3.7 3.5 - 5.1 mmol/L   Chloride 106 98 - 111 mmol/L   CO2 23 22 - 32 mmol/L   Glucose, Bld 125 (H) 70 - 99 mg/dL    Comment: Glucose reference range applies only to samples taken after fasting for at least 8 hours.   BUN 12 8 - 23 mg/dL   Creatinine, Ser 9.52 0.44 - 1.00 mg/dL   Calcium  8.3 (L) 8.9 - 10.3 mg/dL   GFR, Estimated >39 >39 mL/min    Comment: (NOTE) Calculated using the CKD-EPI Creatinine Equation (2021)    Anion gap 11 5 - 15    Comment: Performed at [facility], [address] [place], [place] [address]  Glucose, capillary     Status: Abnormal   Collection Time: 06/20/24  8:17 AM  Result Value Ref Range   Glucose-Capillary 124 (H) 70 - 99 mg/dL    Comment: Glucose reference range applies only to samples taken after fasting for at least 8 hours.  Lactic acid, plasma     Status: None   Collection Time: 06/20/24  9:06 AM  Result Value Ref Range   Lactic Acid, Venous 1.6 0.5 - 1.9 mmol/L    Comment: Performed at [facility], [address], [place], [place] [address]  Glucose, capillary     Status: None   Collection Time: 06/20/24 12:02 PM  Result Value Ref Range   Glucose-Capillary 95 70 - 99 mg/dL    Comment: Glucose reference range applies only to samples taken after fasting for at least 8 hours.   VAS US   ABI WITH/WO TBI Result Date: 06/19/2024  LOWER EXTREMITY DOPPLER STUDY Patient Name:  Sabrina Hart  Date of Exam:   06/19/2024 Medical Rec #: [id number]           Accession #:    [id number] Date of Birth: 05/16/1932            Patient Gender: F Patient Age:  89 years Exam Location:  [facility] Procedure:      VAS US  ABI WITH/WO TBI Referring Phys: Brennyn Haisley JEFFERY --------------------------------------------------------------------------------  Indications: Ulceration.  Limitations: Today's exam was limited due to patient positioning and patient              unable to lie flat. Performing Technologist: Jimmye Scarce RVT  Examination Guidelines: A complete evaluation includes at minimum, Doppler waveform signals and systolic blood pressure reading at the level of bilateral brachial, anterior tibial, and posterior tibial arteries, when vessel segments are accessible. Bilateral testing is considered an integral part of a complete examination. Photoelectric Plethysmograph (PPG) waveforms and toe systolic pressure readings are included as required and additional duplex testing as needed. Limited examinations for reoccurring indications may be performed as noted.  ABI Findings: +--------+------------------+-----+----------+--------+ Right   Rt Pressure (mmHg)IndexWaveform  Comment  +--------+------------------+-----+----------+--------+ Brachial                                 IV       +--------+------------------+-----+----------+--------+ PTA     80                0.76 monophasic         +--------+------------------+-----+----------+--------+ DP      78                0.74 monophasic         +--------+------------------+-----+----------+--------+ +--------+------------------+-----+--------+------------------+ Left    Lt Pressure (mmHg)IndexWaveformComment            +--------+------------------+-----+--------+------------------+ [id number]                                                +--------+------------------+-----+--------+------------------+ PTA                                    pt has contracture +--------+------------------+-----+--------+------------------+ DP      106               1.01 biphasic                   +--------+------------------+-----+--------+------------------+ +-------+-----------+-----------+------------+------------+ ABI/TBIToday's ABIToday's TBIPrevious ABIPrevious TBI +-------+-----------+-----------+------------+------------+ Right  0.76                                           +-------+-----------+-----------+------------+------------+ Left   1.01                                           +-------+-----------+-----------+------------+------------+  Summary: Right: Resting right ankle-brachial index indicates moderate right lower extremity arterial disease. Left: Resting left ankle-brachial index is within normal range. *See table(s) above for measurements and observations.  Electronically signed by Norman Serve on 06/19/2024 at 5:14:14 PM.    Final    MR FOOT RIGHT W WO CONTRAST Result Date: 06/19/2024 CLINICAL DATA:  Soft tissue infection suspected, foot, xray done EXAM: MRI OF THE RIGHT FOREFOOT WITHOUT AND WITH CONTRAST TECHNIQUE: Multiplanar, multisequence MR imaging of the right forefoot was performed before and after the administration of  intravenous contrast. CONTRAST:  4mL GADAVIST  GADOBUTROL  1 MMOL/ML IV SOLN COMPARISON:  None Available. FINDINGS: Technical note: Despite efforts by the technologist and patient, mild-to-moderate motion artifact is present on today's exam and could not be eliminated. This reduces exam sensitivity and specificity. Bones/Joint/Cartilage There is a large area of soft tissue ulceration medial to the 1st metatarsophalangeal joint, further described below. There are underlying marrow changes within the 1st metatarsal head, its sesamoids and the base of the 1st proximal phalanx,  highly suspicious for osteomyelitis. Specifically, there is decreased T1 signal, T2 hyperintensity and heterogeneous enhancement following contrast. There is apparent cortical destruction involving the medial head of the 1st metatarsal T2 hyperintensity extends proximally into the mid shaft of the 1st metatarsal. There is a small effusion of the 1st metatarsophalangeal joint with associated synovial enhancement following contrast. No suspicious marrow signal abnormalities or enhancement elsewhere in the forefoot. Mild midfoot and 2nd metatarsophalangeal joint degenerative changes. Ligaments Intact Lisfranc ligament. The medial collateral ligament the 1st metatarsophalangeal joint is not well visualized. The additional collateral ligaments appear intact. Muscles and Tendons Mild generalized muscular atrophy and edema with low level muscular enhancement medially. No discrete intramuscular fluid collections are identified. The forefoot tendons appear intact without significant tenosynovitis. Soft tissues As above, large area of soft tissue ulceration medial to the 1st metatarsophalangeal joint within underlying heterogeneous, peripherally enhancing fluid collection measuring approximately 2.2 x 2.6 x 1.7 cm, suspicious for an abscess. This abuts the 1st metatarsophalangeal joint and is associated with adjacent synovial enhancement and marrow changes consistent with osteomyelitis, as described above. Generalized subcutaneous edema throughout the forefoot without other focal fluid collection. IMPRESSION: 1. Large area of soft tissue ulceration medial to the 1st metatarsophalangeal joint with underlying abscess and underlying osteomyelitis of the 1st metatarsal head, its sesamoids and base of the 1st proximal phalanx. 2. The abscess abuts the 1st metatarsophalangeal joint and is associated with a small effusion and synovial enhancement, suspicious for septic arthritis. 3. No evidence of osteomyelitis elsewhere in the  forefoot. 4. Generalized subcutaneous edema throughout the forefoot without other focal fluid collection. Electronically Signed   By: Elsie Perone M.D.   On: 06/19/2024 08:53    Anti-infectives (From admission, onward)    Start     Dose/Rate Route Frequency Ordered Stop   06/22/24 1000  doxycycline  (VIBRA -TABS) tablet 100 mg        100 mg Oral Every 12 hours 06/20/24 0937     06/20/24 1200  vancomycin  (VANCOCIN ) IVPB 1000 mg/200 mL premix  Status:  Discontinued        1,000 mg 200 mL/hr over 60 Minutes Intravenous Every 48 hours 06/18/24 1432 06/19/24 1540   06/20/24 1030  cefadroxil  (DURICEF) capsule 500 mg        500 mg Oral 2 times daily 06/20/24 0937     06/20/24 0600  vancomycin  (VANCOREADY) IVPB 1250 mg/250 mL  Status:  Discontinued        1,250 mg 166.7 mL/hr over 90 Minutes Intravenous Every 48 hours 06/19/24 1540 06/20/24 0944   06/19/24 1600  cefTRIAXone  (ROCEPHIN ) 2 g in sodium chloride  0.9 % 100 mL IVPB  Status:  Discontinued        2 g 200 mL/hr over 30 Minutes Intravenous Every 24 hours 06/19/24 1417 06/20/24 0944   06/18/24 2000  aztreonam  (AZACTAM ) 1 g in sodium chloride  0.9 % 100 mL IVPB  Status:  Discontinued        1 g 200 mL/hr over 30 Minutes  Intravenous Every 8 hours 06/18/24 1432 06/19/24 1417   06/18/24 1015  aztreonam  (AZACTAM ) 2 g in sodium chloride  0.9 % 100 mL IVPB        2 g 200 mL/hr over 30 Minutes Intravenous  Once 06/18/24 1008 06/18/24 1126   06/18/24 1015  vancomycin  (VANCOCIN ) IVPB 1000 mg/200 mL premix  Status:  Discontinued        1,000 mg 200 mL/hr over 60 Minutes Intravenous  Once 06/18/24 1008 06/18/24 1014   06/18/24 1015  vancomycin  (VANCOREADY) IVPB 1250 mg/250 mL        1,250 mg 166.7 mL/hr over 90 Minutes Intravenous  Once 06/18/24 1014 06/18/24 1321       Assessment/Plan R hip decubitus ulcer - Case reviewed with my attending. No current role for OR at this time. Agree with Orthopedics about that wound care and pressure offloading  would be the safest option.  - WOCN following for wound care recommendations. Agree with recommendations for Santyl   - Will order PT Hydrotherapy - Agree with palliative care consult for GOC conversations  I reviewed nursing notes, Consultant (Ortho) notes, hospitalist notes, last 24 h vitals and pain scores, last 48 h intake and output, last 24 h labs and trends, and last 24 h imaging results.  Sabrina Hart, [facility] Surgery 06/20/2024, 3:39 PM Please see Amion for pager number during day hours 7:00am-4:30pm

## 2024-06-21 DIAGNOSIS — R6510 Systemic inflammatory response syndrome (SIRS) of non-infectious origin without acute organ dysfunction: Secondary | ICD-10-CM | POA: Diagnosis not present

## 2024-06-21 LAB — GLUCOSE, CAPILLARY
Glucose-Capillary: 109 mg/dL — ABNORMAL HIGH (ref 70–99)
Glucose-Capillary: 111 mg/dL — ABNORMAL HIGH (ref 70–99)
Glucose-Capillary: 112 mg/dL — ABNORMAL HIGH (ref 70–99)
Glucose-Capillary: 114 mg/dL — ABNORMAL HIGH (ref 70–99)
Glucose-Capillary: 117 mg/dL — ABNORMAL HIGH (ref 70–99)
Glucose-Capillary: 126 mg/dL — ABNORMAL HIGH (ref 70–99)

## 2024-06-21 LAB — CULTURE, BLOOD (ROUTINE X 2): Special Requests: ADEQUATE

## 2024-06-21 MED ORDER — APIXABAN 2.5 MG PO TABS: 2.5000 mg | ORAL_TABLET | Freq: Two times a day (BID) | ORAL | Status: DC

## 2024-06-21 MED ORDER — APIXABAN 5 MG PO TABS
5.0000 mg | ORAL_TABLET | Freq: Two times a day (BID) | ORAL | Status: DC
  Administered 2024-06-21 – 2024-06-23 (×5): 5 mg via ORAL
  Filled 2024-06-21 (×5): qty 1

## 2024-06-21 NOTE — Progress Notes (Signed)
 Daily Progress Note   Date: 06/21/2024   Patient Name: Sabrina Hart  DOB: Jul 02, 1932  MRN: [id number]  Age / Sex: 89 y.o., female  Attending Physician: Vernon Ranks, MD Primary Care Physician: [name], MD Admit Date: 06/18/2024 Length of Stay: 3 days  Reason for Follow-up: Establishing goals of care  Past Medical History:  Diagnosis Date   Anginal pain [facility])    1990's; 09/28/2012   Arthritis    very mild (09/28/2012)   Coronary artery disease 09/27/2012   s/p PCI of the RCA with DES with remote PCI in [place] 15 to 20 years ago. 2. cath 02/2013 patent mid RCA stent, 30% left main, 70% mid to distaal tanden 70% lesions x 3, 60% mid diag, 50% ostial LCx, 40% mid LCx.    Glaucoma    Hyperlipidemia    Hypothyroidism     Subjective:   Subjective: Chart Reviewed. Updates received. Patient Assessed. Created space and opportunity for patient  and family to explore thoughts and feelings regarding current medical situation.  Today's Discussion: Today before meeting with the patient/family, I reviewed the chart including PT note from this morning for attempted hydrotherapy awaiting goals of care conversations, family medicine/hospitalist note from today, general surgery note from yesterday.  Essentially the patient does not have surgical options at this time, recommending wound care and offloading but both orthopedic surgery and general surgery.  I also reviewed labs today including multiple capillary glucose readings showing stable glucose in the 110's.  Today I saw the patient at bedside at the prearranged 10:30 AM time for family meeting.  The patient was asleep during most of our visit.  Present at bedside was the patient's daughter 71, Bisi's husband AK, and patient's grandson Segun.  Understanding of illness: The patient's daughter states that she has dementia and paranoid schizophrenia and is on multiple medications for this.  She has a wound with a lot of eschar.   Orthopedic surgery recommended no debridement.  She is wheelchair-bound because of osteoarthritis.  She also has an acute DVT in her calves and thighs for which she was started on Eliquis  which led to her bleeding which is why they brought her to the hospital.  Since then they have found multiple issues and acute problems.  They also note she has neuropathy and at night her feet rub together and she scratches them which causes blisters to form.  They put a medical pillow between her legs to help and have been making wound care times.  They know she has a large wound on her hip and wounds on her ankles.  We spent a significant amount of time discussing her current clinical status including chronic illnesses and new acute problems that are complicating her trajectory.  Today's Discussion: In addition to discussion described above we had extensive discussion of various topics.  We spent a good amount of time talking through pathophysiology of her various issues.  We talked about how malnutrition and limited mobility contribute to worsening wounds.  They discussed that they are declining the idea of amputation because they understand that her amputation/surgical wound would likely not heal.  We also discussed that she is likely not a surgical candidate because of how sick she is and her frail state, intubation and anesthesia is likely not an option.  They expressed understanding about this.  We also spent time discussing advance care planning, as detailed below in the advance care planning section.  We discussed CODE STATUS, began discussing comfort  care as an option in the future, and other options available moving forward.  They understand she has sepsis with a hip abscess and a large wound on her hip.  She also has foot infection and osteomyelitis.  Again, they are against amputation.  They understand she is not a surgical candidate at this time.  I reviewed the general surgery notes and they have requested  for the general surgery team to call and discussed with them as well and I told them I would send a message to for this.  We discussed options moving forward including continued aggressive care, a more comfort focused approach, and multiple options in between.  I explained comfort care as care where the patient would no longer receive aggressive medical interventions such as continuous vital signs, lab work, radiology testing, or medications not focused on comfort, peace, and dignity. This includes stopping antibiotics and weaning oxygen to room air, as these are generally not accepted as providing comfort but only prolonging the dying process artificially. All care would focus on how the patient is looking and feeling. This would include management of any symptoms that may cause discomfort, pain, shortness of breath/air hunger, increased work of breathing, cough, nausea, agitation/restlessness, anxiety, and/or secretions etc. Symptoms would be managed with medications and other non-pharmacological interventions such as spiritual support if requested, repositioning, music therapy, or therapeutic listening. Family verbalized understanding. After discussion family has decided to continue current scope of care, allow some time for outcomes to see how she does with antibiotics and medications, continue palliative support for further discussions as her clinical picture evolves.  I shared that I would be off tomorrow but back on service on Saturday and recheck and then to see how the patient has done.  I shared that I would update the medical team as well. I provided emotional and general support through therapeutic listening, empathy, sharing of stories, and other techniques. I answered all questions and addressed all concerns to the best of my ability.  After seeing the patient I reached out to Michael Maczis, [place] with general surgery to discuss goals of care conversation and request for update from general  surgery. Mr. Tari states that he will call the family later today to discuss with them.  Review of Systems  Unable to perform ROS   Objective:   Primary Diagnoses: Present on Admission:  Hypothyroidism  Primary open angle glaucoma (POAG) of both eyes  Severe late onset Alzheimer's dementia (HCC)  Hyperlipidemia  Essential hypertension  CAD (coronary artery disease)  SIRS (systemic inflammatory response syndrome) (HCC)   Vital Signs:  BP (!) 101/51 (BP Location: Left Arm)   Pulse 84   Temp 98.8 F (37.1 C) (Oral)   Resp (!) 21   Ht 5' 3 (1.6 m)   Wt 49.6 kg   SpO2 100%   BMI 19.37 kg/m   Physical Exam Vitals and nursing note reviewed.  Constitutional:      General: She is sleeping. She is not in acute distress.    Comments: Appears frail  HENT:     Head: Normocephalic and atraumatic.  Cardiovascular:     Rate and Rhythm: Normal rate.  Pulmonary:     Effort: Pulmonary effort is normal. No respiratory distress.  Abdominal:     General: Abdomen is flat.  Skin:    General: Skin is warm and dry.  Neurological:     General: No focal deficit present.  Psychiatric:        Mood and  Affect: Mood normal.        Behavior: Behavior normal.     Palliative Assessment/Data: 30-40%   Advanced Care Planning:   Existing Vynca/ACP Documentation: MOST form signed/08/2023 UPLOADED updated MOST form signed 06/17/2024 UPLOADED DNR signed 06/17/2024  Primary Decision Maker: HCPOA/next of kin daughter Bisi  Pertinent diagnosis: Severe sepsis, decubitus ulcers, right hip abscess, osteomyelitis of the first metatarsal head of the left foot, septic encephalopathy, AKI  The patient and/or family consented to a voluntary Advance Care Planning Conversation in person. Individuals present for the conversation: Daughter Bisi, son-in-law AK, grandson Lorry, this NP.  Summary of the conversation: We discussed clinical picture, chronic illnesses and acute illnesses, trajectory, CODE  STATUS, options moving forward for aggressive care versus comfort care versus a middle point between nose.  Outcome of the conversations and/or documents completed: Family has elected DNR-limited, completed DNR form, completed MOST form (summary of selections below).   I completed a MOST form today. The patient and family outlined their wishes for the following treatment decisions:  Cardiopulmonary Resuscitation: Do Not Attempt Resuscitation (DNR/No CPR)  Medical Interventions: Limited Additional Interventions: Use medical treatment, IV fluids and cardiac monitoring as indicated, DO NOT USE intubation or mechanical ventilation. May consider use of less invasive airway support such as BiPAP or CPAP. Also provide comfort measures. Transfer to the hospital if indicated. Avoid intensive care.   Antibiotics: Antibiotics if indicated  IV Fluids: IV fluids if indicated  Feeding Tube: No feeding tube   I spent 50 minutes providing separately identifiable ACP services with the patient and/or surrogate decision maker in a voluntary, in-person conversation discussing the patient's wishes and goals as detailed in the above note.  Assessment & Plan:   HPI/Patient Profile:  89 y.o. female  with past medical history of hypertension, hyperlipidemia, hypothyroidism, CAD status post stent, dementia, glaucoma, DVT on Eliquis  presented with confusion, wound changes, constipation.  She was admitted on 06/18/2024 with SIRS, severe sepsis secondary to infected decubitus ulcers, right hip abscess, osteomyelitis of the first metatarsal head of the right foot, acute septic encephalopathy, constipation, AKI, and others.    Palliative medicine was consulted for GOC conversations.  SUMMARY OF RECOMMENDATIONS   DNR-limited Continue current scope of care No feeding tubes Time for outcomes Palliative medicine will follow-up in a couple days for ongoing discussions  Symptom Management:  Per primary team PMD is available  to assist as needed  Code Status: DNR-limited  Prognosis: Unable to determine  Discharge Planning: To Be Determined  Discussed with: Patient, family, medical team, nursing team  Thank you for allowing us  to participate in the care of Bonney Berres PMT will continue to support holistically.  Billing based on MDM: High  Problems Addressed: One acute or chronic illness or injury that poses a threat to life or bodily function  Amount and/or Complexity of Data: Category 1:Review of prior external note(s) from each unique source, Review of the result(s) of each unique test, and Assessment requiring an independent historian(s) and Category 3:Discussion of management or test interpretation with external physician/other qualified health care professional/appropriate source (not separately reported)  Risks: Decision not to resuscitate or to de-escalate care because of poor prognosis  Detailed review of medical records (labs, imaging, vital signs), medically appropriate exam, discussed with treatment team, counseling and education to patient, family, & staff, documenting clinical information, medication management, coordination of care  Camellia Kays, NP Palliative Medicine Team  Team Phone # [phone] (Nights/Weekends)  07/28/2021, 8:17 AM

## 2024-06-21 NOTE — Progress Notes (Signed)
 Physical Therapy Wound Evaluation & Treatment Patient Details  Name: Sabrina Hart MRN: [id number] Date of Birth: 09-Jun-1932  Today's Date: 06/21/2024 Time: 1244-1400 Time Calculation (min): 76 min  Subjective  Subjective Assessment Subjective: Pt nonverbal throughout session. Patient and Family Stated Goals: did not state Date of Onset:  (unknown) Prior Treatments: dressing changes  Pain Score:  Pt not premedicated. Pt nonverbal. Did not grimace but did get restless and try to cover wound intermittently, suggesting some discomfort intermittently. Other times she was sleeping, suggesting minimal pain if any.  Wound Assessment  Wound 06/18/24 1940 Pressure Injury Ischial tuberosity Anterior;Proximal;Right Unstageable - Full thickness tissue loss in which the base of the injury is covered by slough (yellow, tan, gray, green or brown) and/or eschar (tan, brown or black) in the wo (Active)  Wound Image  Prior to wound therapy:    Following wound therapy:    06/21/24 1400  Site / Wound Assessment Yellow;Black;Clean;Dry 06/21/24 1400  Peri-wound Assessment Intact;Pink 06/21/24 1400  Wound Length (cm) 9.8 cm 06/21/24 1400  Wound Width (cm) 6.5 cm 06/21/24 1400  Wound Surface Area (cm^2) 50.03 cm^2 06/21/24 1400  Wound Depth (cm) 0.2 cm 06/21/24 1400  Wound Volume (cm^3) 6.671 cm^3 06/21/24 1400  Drainage Description Purulent;Odor - foul 06/21/24 1400  Drainage Amount Scant 06/21/24 1400  Treatment Cleansed;Debridement (Selective);Irrigation;Off loading;Packing (Saline gauze) 06/21/24 1400  Dressing Type Foam - Lift dressing to assess site every shift;Gauze (Comment);ABD;Moist to dry;Santyl ;Normal saline moist dressing;Barrier Film (skin prep) 06/21/24 1400  Dressing Changed Changed 06/21/24 1400  Dressing Status Clean, Dry, Intact 06/21/24 1400  State of Healing Eschar 06/21/24 1400  % Wound base Red or Granulating 0% 06/21/24 1400  % Wound base Yellow/Fibrinous Exudate 5%  06/21/24 1400  % Wound base Black/Eschar 95% 06/21/24 1400  % Wound base Other/Granulation Tissue (Comment) 0% 06/21/24 1400  Tunneling (cm) 0 06/21/24 1400  Undermining (cm) 0 06/21/24 1400  Margins Unattached edges (unapproximated) 06/21/24 1400  Non-staged Wound Description Full thickness 06/21/24 1400   Selective Debridement (non-excisional) Selective Debridement (non-excisional) - Location: R ischial tuberosity Selective Debridement (non-excisional) - Tools Used: Forceps, Scalpel, Scissors Selective Debridement (non-excisional) - Tissue Removed: Black eschar, yellow and black slough, necrotic adipose tissue    Wound Assessment and Plan  Wound Therapy - Assess/Plan/Recommendations Wound Therapy - Clinical Statement: Pt presents with a R ischial tuberosity unstageable wound covered primarily by black leathery eschar. Upon beginning to debride the black eschar, a large amount of dark, purulent drainage began to seep from the wound, primarily from the L lateral border, but some also from the R and inferior borders. Pt appeared to have abscesses. Pushed as much drainage out of the wound from all directions throughout the session. The majority of the black overlying eschar was successfully removed this session, exposing deep undermining at the R, L, and inferior borders. The pt could continue to benefit from wound therapy to assist in removing necrotic tissue and reduce bioburden and thereby improve wound healing. Wound Therapy - Functional Problem List: decreased mobility; decreased cognition Factors Delaying/Impairing Wound Healing: Incontinence, Immobility, Multiple medical problems Hydrotherapy Plan: Debridement, Dressing change, Patient/family education Wound Therapy - Frequency: 2X / week Wound Therapy - Current Recommendations: Case manager/social work, PT Wound Therapy - Follow Up Recommendations: dressing changes by family/patient  Wound Therapy Goals- Improve the function of  patient's integumentary system by progressing the wound(s) through the phases of wound healing (inflammation - proliferation - remodeling) by: Wound Therapy Goals - Improve the function of  patient's integumentary system by progressing the wound(s) through the phases of wound healing by: Decrease Necrotic Tissue to: 10% Decrease Necrotic Tissue - Progress: Goal set today Increase Granulation Tissue to: 90% Increase Granulation Tissue - Progress: Goal set today Improve Drainage Characteristics: Serous, Min Improve Drainage Characteristics - Progress: Goal set today Patient/Family will be able to : change the wound dressing independently Patient/Family Instruction Goal - Progress: Goal set today Goals/treatment plan/discharge plan were made with and agreed upon by patient/family: No, Patient unable to participate in goals/treatment/discharge plan and family unavailable Time For Goal Achievement: 7 days Wound Therapy - Potential for Goals: Good  Goals will be updated until maximal potential achieved or discharge criteria met.  Discharge criteria: when goals achieved, discharge from hospital, MD decision/surgical intervention, no progress towards goals, refusal/missing three consecutive treatments without notification or medical reason.  GP     Charges PT Wound Care Charges $Wound Debridement up to 20 cm: < or equal to 20 cm $ Wound Debridement each add'l 20 sqcm: 2 $PT Hydrotherapy Dressing: 1 dressing $PT Hydrotherapy Visit: 1 Visit  Theo Ferretti, PT, DPT Acute Rehabilitation Services  Office: [phone]       Theo CHRISTELLA Ferretti 06/21/2024, 2:29 PM

## 2024-06-21 NOTE — Care Management Important Message (Signed)
 Important Message  Patient Details  Name: Sabrina Hart MRN: [id number] Date of Birth: 02/23/1932   Important Message Given:  Yes - Medicare IM     Claretta Deed 06/21/2024, 4:26 PM

## 2024-06-21 NOTE — Progress Notes (Signed)
 PROGRESS NOTE    Sabrina Hart  FMW:[id number] DOB: [birth year] DOA: 06/18/2024 PCP: [name], MD   Brief Narrative:  Sabrina Hart is a 89 y.o. female with medical history significant of hypertension, hyperlipidemia, hypothyroidism, CAD status post stent, dementia, glaucoma, DVT on Eliquis  presented with confusion, wound changes, constipation.   Patient has a chronic decubitus ulcer on her bilateral gluteal regions as well as her metatarsal regions.  She has not followed outpatient and wounds have been healing.  There has been some increased seepage after starting Eliquis  for DVT. Including bloody drainage.  There is also been some nosebleeding and some intermittent blood in the mouth in the morning possibly due to nosebleeding that is being swallowed/pooling in the mouth. Patient is wheelchair-bound at baseline.  Family also notes a decline in mentation and oral activity for the past several weeks as well.  CT abdomen pelvis showed crescentic abscess at the right hip measuring 12 x 2 x 12 cm, prominent stool favoring constipation, mesenteric edema unable to exclude small ascites, severe arthropathy of the hips possible synovitis in the right hip.  Right kidney lesion favoring a cyst but is indeterminant MRI recommended for follow-up versus CT with and without contrast. Patient received vancomycin , aztreonam  in the ED in the setting of penicillin allergy.  Orthopedics consulted.  Was also diagnosed with right foot abscess and osteomyelitis.  Details below.  Assessment & Plan:   Principal Problem:   SIRS (systemic inflammatory response syndrome) (HCC) Active Problems:   CAD (coronary artery disease)   Hypothyroidism   Hyperlipidemia   Severe late onset Alzheimer's dementia (HCC)   Essential hypertension   Primary open angle glaucoma (POAG) of both eyes   Pressure injury of right hip, unstageable (HCC)   Gangrene of right foot (HCC)   Pressure injury of skin  Severe sepsis  secondary to infected decubitus ulcers, right hip abscess and abscess as well as osteomyelitis of the first metatarsal head of the right foot, POA: Patient met sepsis criteria based on tachypnea and leukocytosis and lactic acid of 3.7. > CT abdomen pelvis showed crescentic abscess at the right hip measuring 12 x 2 x 12 cm, prominent stool favoring constipation, mesenteric edema unable to exclude small ascites, severe arthropathy of the hips possible synovitis in the right hip.  > Certainly has abscess at the right hip and concern for synovitis.  Lower extremity wounds with some infection.  Wound care consulted. Patient seen by Dr. Harden, plan for bilateral ABI.  Per him, patient would not be candidate for endovascular evaluation due to her flexion contractures.  And he did not feel that there is a surgical intervention that he could proceed with safely to debride the large decubitus ulcers on the right hip. Eventually MRI of the right foot showed Large area of soft tissue ulceration medial to the 1st metatarsophalangeal joint with underlying abscess and underlying osteomyelitis of the 1st metatarsal head, its sesamoids and base of the 1st proximal phalanx.  Patient started on vancomycin  and aztreonam  due to penicillin allergy however later transitioned to Rocephin  as per pharmacy, and eventually transitioned to oral Duricef by Dr. Harden 06/20/2024.  No fever.  Blood culture negative.  Lactic acidosis resolved. Family wanted the wounds to be debribed but they do not want any amputation for the right foot.  Dr. Harden does not feel that he can do that safely, he sought a second opinion General Surgery and general surgery also does not think that debridement can be  done safely for this patient.  They recommended palliative care.  Family is going to have meeting with palliative care at 10:30 AM today.  Acute septic encephalopathy, POA: Improved, she is at baseline.  Right kidney lesion favoring a cyst but is  indeterminant MRI recommended for follow-up versus CT with and without contrast.  Calcified uterus.  Sensitivity lower due to patient in the left lateral decubitus.  Hypoglycemia: 47 yesterday, started on D5 NS.  Blood sugar improving.  No plans for surgery so we will start her on the diet.  Constipation: MiraLAX  and Fleet enema and Colace ordered.  AKI: Likely ATN due to severe sepsis.  Resolved with IV fluids  Hypokalemia: Resolved.  Mild hyponatremia: Monitor.   Hypertension - Holding home amlodipine  in the setting of above, still hypotensive.   Hyperlipidemia - Atorvastatin  held outpatient   CAD > Status post stents - ContinueEliquis   Hypothyroidism - Continue home Synthroid    Glaucoma - Continue eyedrops once confirmed   History of DVT - Holding Eliquis  for now pending surgical evaluation.  Will likely resume after if okay with surgery colleagues.   Dementia - Continue home donepezil , risperidone , Ativan , sertraline  once confirmed  DVT prophylaxis: Place and maintain sequential compression device Start: 06/18/24 1339   Code Status: Limited: Do not attempt resuscitation (DNR) -DNR-LIMITED -Do Not Intubate/DNI   Family Communication: None present at bedside.   Status is: Inpatient Remains inpatient appropriate because: Needs clarification of plan of care.   Estimated body mass index is 19.37 kg/m as calculated from the following:   Height as of this encounter: 5' 3 (1.6 m).   Weight as of this encounter: 49.6 kg.    Nutritional Assessment: Body mass index is 19.37 kg/m.[name] Seen by dietician.  I agree with the assessment and plan as outlined below: Nutrition Status:   . Skin Assessment: I have examined the patient's skin and I agree with the wound assessment as performed by the wound care RN as outlined below:    Consultants:  Orthopedics  Procedures:  None so far  Antimicrobials:  Anti-infectives (From admission, onward)    Start     Dose/Rate  Route Frequency Ordered Stop   06/22/24 1000  doxycycline  (VIBRA -TABS) tablet 100 mg        100 mg Oral Every 12 hours 06/20/24 0937     06/20/24 1200  vancomycin  (VANCOCIN ) IVPB 1000 mg/200 mL premix  Status:  Discontinued        1,000 mg 200 mL/hr over 60 Minutes Intravenous Every 48 hours 06/18/24 1432 06/19/24 1540   06/20/24 1030  cefadroxil  (DURICEF) capsule 500 mg        500 mg Oral 2 times daily 06/20/24 0937     06/20/24 0600  vancomycin  (VANCOREADY) IVPB 1250 mg/250 mL  Status:  Discontinued        1,250 mg 166.7 mL/hr over 90 Minutes Intravenous Every 48 hours 06/19/24 1540 06/20/24 0944   06/19/24 1600  cefTRIAXone  (ROCEPHIN ) 2 g in sodium chloride  0.9 % 100 mL IVPB  Status:  Discontinued        2 g 200 mL/hr over 30 Minutes Intravenous Every 24 hours 06/19/24 1417 06/20/24 0944   06/18/24 2000  aztreonam  (AZACTAM ) 1 g in sodium chloride  0.9 % 100 mL IVPB  Status:  Discontinued        1 g 200 mL/hr over 30 Minutes Intravenous Every 8 hours 06/18/24 1432 06/19/24 1417   06/18/24 1015  aztreonam  (AZACTAM ) 2 g in sodium  chloride 0.9 % 100 mL IVPB        2 g 200 mL/hr over 30 Minutes Intravenous  Once 06/18/24 1008 06/18/24 1126   06/18/24 1015  vancomycin  (VANCOCIN ) IVPB 1000 mg/200 mL premix  Status:  Discontinued        1,000 mg 200 mL/hr over 60 Minutes Intravenous  Once 06/18/24 1008 06/18/24 1014   06/18/24 1015  vancomycin  (VANCOREADY) IVPB 1250 mg/250 mL        1,250 mg 166.7 mL/hr over 90 Minutes Intravenous  Once 06/18/24 1014 06/18/24 1321         Subjective: Patient seen and examined, alert and oriented to self only which is her baseline.  She denied any complaint.  Objective: Vitals:   06/20/24 1921 06/20/24 2200 06/21/24 0500 06/21/24 0758  BP: (!) 111/53 118/79 (!) 101/51   Pulse: 91 92 90 84  Resp: 16 (!) 21 19 (!) 21  Temp: (!) 97.5 F (36.4 C)  100.1 F (37.8 C) 98.8 F (37.1 C)  TempSrc: Oral  Oral Oral  SpO2: 100% 100% 100% 100%  Weight:       Height:        Intake/Output Summary (Last 24 hours) at 06/21/2024 1105 Last data filed at 06/20/2024 2225 Gross per 24 hour  Intake 983.82 ml  Output --  Net 983.82 ml   Filed Weights   06/18/24 0936 06/18/24 1938  Weight: 52.2 kg 49.6 kg    Examination:  General exam: Appears calm and comfortable  Respiratory system: Clear to auscultation. Respiratory effort normal. Cardiovascular system: S1 & S2 heard, RRR. No JVD, murmurs, rubs, gallops or clicks. No pedal edema. Gastrointestinal system: Abdomen is nondistended, soft and nontender. No organomegaly or masses felt. Normal bowel sounds heard. Central nervous system: Alert and oriented x 1.  No focal deficit but she has contractures in the lower extremities. Extremities: Symmetric 5 x 5 power. Skin: Wounds at bilateral hips.  Had dressing.  Data Reviewed: I have personally reviewed following labs and imaging studies  CBC: Recent Labs  Lab 06/18/24 0940 06/19/24 0554 06/20/24 0604  WBC 12.3* 8.7 8.3  NEUTROABS 10.6*  --  6.9  HGB 12.5 10.1* 10.0*  HCT 38.3 31.1* 30.4*  MCV 86.7 86.4 86.4  PLT 440* 371 296   Basic Metabolic Panel: Recent Labs  Lab 06/18/24 0940 06/19/24 0554 06/20/24 0604  NA 135 134* 140  K 3.4* 2.9* 3.7  CL 98 98 106  CO2 26 25 23   GLUCOSE 158* 89 125*  BUN 39* 22 12  CREATININE 1.15* 0.56 0.47  CALCIUM  9.0 8.5* 8.3*  MG 2.3  --   --    GFR: Estimated Creatinine Clearance: 36.6 mL/min (by C-G formula based on SCr of 0.47 mg/dL). Liver Function Tests: Recent Labs  Lab 06/18/24 0940 06/19/24 0554  AST 39 26  ALT 20 16  ALKPHOS 104 81  BILITOT 1.0 0.8  PROT 7.4 5.9*  ALBUMIN 2.3* 1.9*   No results for input(s): LIPASE, AMYLASE in the last 168 hours. No results for input(s): AMMONIA in the last 168 hours. Coagulation Profile: No results for input(s): INR, PROTIME in the last 168 hours. Cardiac Enzymes: No results for input(s): CKTOTAL, CKMB, CKMBINDEX, TROPONINI  in the last 168 hours. BNP (last 3 results) No results for input(s): PROBNP in the last 8760 hours. HbA1C: No results for input(s): HGBA1C in the last 72 hours. CBG: Recent Labs  Lab 06/20/24 1649 06/20/24 2107 06/21/24 0004 06/21/24 9557 06/21/24 9185  GLUCAP 98 165* 114* 117* 112*   Lipid Profile: No results for input(s): CHOL, HDL, LDLCALC, TRIG, CHOLHDL, LDLDIRECT in the last 72 hours. Thyroid  Function Tests: No results for input(s): TSH, T4TOTAL, FREET4, T3FREE, THYROIDAB in the last 72 hours. Anemia Panel: No results for input(s): VITAMINB12, FOLATE, FERRITIN, TIBC, IRON, RETICCTPCT in the last 72 hours. Sepsis Labs: Recent Labs  Lab 06/18/24 0950 06/18/24 1141 06/20/24 0906  LATICACIDVEN 1.9 3.7* 1.6    Recent Results (from the past 240 hours)  Urine Culture     Status: None   Collection Time: 06/18/24  1:21 AM   Specimen: Urine, Random  Result Value Ref Range Status   Specimen Description URINE, RANDOM  Final   Special Requests URINE, CLEAN CATCH  Final   Culture   Final    NO GROWTH Performed at [facility], [address] [place], [place] [address]    Report Status 06/20/2024 FINAL  Final  Culture, blood (routine x 2)     Status: None (Preliminary result)   Collection Time: 06/18/24 10:30 AM   Specimen: BLOOD RIGHT ARM  Result Value Ref Range Status   Specimen Description BLOOD RIGHT ARM  Final   Special Requests   Final    BOTTLES DRAWN AEROBIC AND ANAEROBIC Blood Culture adequate volume   Culture   Final    NO GROWTH 2 DAYS Performed at [facility], [address] [place], [place] [address]    Report Status PENDING  Incomplete  Culture, blood (routine x 2)     Status: Abnormal   Collection Time: 06/18/24 10:35 AM   Specimen: BLOOD LEFT ARM  Result Value Ref Range Status   Specimen Description BLOOD LEFT ARM  Final   Special Requests   Final    BOTTLES DRAWN AEROBIC AND ANAEROBIC Blood Culture  adequate volume   Culture  Setup Time   Final    GRAM POSITIVE COCCI IN CLUSTERS ANAEROBIC BOTTLE ONLY CRITICAL RESULT CALLED TO, READ BACK BY AND VERIFIED WITH: PHARMD EMILY SINCLAIR ON 06/19/24 @ 1516 BY DRT    Culture (A)  Final    STAPHYLOCOCCUS CAPITIS THE SIGNIFICANCE OF ISOLATING THIS ORGANISM FROM A SINGLE SET OF BLOOD CULTURES WHEN MULTIPLE SETS ARE DRAWN IS UNCERTAIN. PLEASE NOTIFY THE MICROBIOLOGY DEPARTMENT WITHIN ONE WEEK IF SPECIATION AND SENSITIVITIES ARE REQUIRED. Performed at [facility], [address] [place], [place] [address]    Report Status 06/21/2024 FINAL  Final  Blood Culture ID Panel (Reflexed)     Status: Abnormal   Collection Time: 06/18/24 10:35 AM  Result Value Ref Range Status   Enterococcus faecalis NOT DETECTED NOT DETECTED Final   Enterococcus Faecium NOT DETECTED NOT DETECTED Final   Listeria monocytogenes NOT DETECTED NOT DETECTED Final   Staphylococcus species DETECTED (A) NOT DETECTED Final    Comment: CRITICAL RESULT CALLED TO, READ BACK BY AND VERIFIED WITH: PHARMD EMILY SINCLAIR ON 06/19/24 @ 1516 BY DRT    Staphylococcus aureus (BCID) NOT DETECTED NOT DETECTED Final   Staphylococcus epidermidis NOT DETECTED NOT DETECTED Final   Staphylococcus lugdunensis NOT DETECTED NOT DETECTED Final   Streptococcus species NOT DETECTED NOT DETECTED Final   Streptococcus agalactiae NOT DETECTED NOT DETECTED Final   Streptococcus pneumoniae NOT DETECTED NOT DETECTED Final   Streptococcus pyogenes NOT DETECTED NOT DETECTED Final   A.calcoaceticus-baumannii NOT DETECTED NOT DETECTED Final   Bacteroides fragilis NOT DETECTED NOT DETECTED Final   Enterobacterales NOT DETECTED NOT DETECTED Final  Enterobacter cloacae complex NOT DETECTED NOT DETECTED Final   Escherichia coli NOT DETECTED NOT DETECTED Final   Klebsiella aerogenes NOT DETECTED NOT DETECTED Final   Klebsiella oxytoca NOT DETECTED NOT DETECTED Final   Klebsiella pneumoniae NOT DETECTED NOT  DETECTED Final   Proteus species NOT DETECTED NOT DETECTED Final   Salmonella species NOT DETECTED NOT DETECTED Final   Serratia marcescens NOT DETECTED NOT DETECTED Final   Haemophilus influenzae NOT DETECTED NOT DETECTED Final   Neisseria meningitidis NOT DETECTED NOT DETECTED Final   Pseudomonas aeruginosa NOT DETECTED NOT DETECTED Final   Stenotrophomonas maltophilia NOT DETECTED NOT DETECTED Final   Candida albicans NOT DETECTED NOT DETECTED Final   Candida auris NOT DETECTED NOT DETECTED Final   Candida glabrata NOT DETECTED NOT DETECTED Final   Candida krusei NOT DETECTED NOT DETECTED Final   Candida parapsilosis NOT DETECTED NOT DETECTED Final   Candida tropicalis NOT DETECTED NOT DETECTED Final   Cryptococcus neoformans/gattii NOT DETECTED NOT DETECTED Final    Comment: Performed at [facility], [address] [place], [place] [address]  MRSA Next Gen by PCR, Nasal     Status: None   Collection Time: 06/18/24  8:47 PM   Specimen: Nasal Mucosa; Nasal Swab  Result Value Ref Range Status   MRSA by PCR Next Gen NOT DETECTED NOT DETECTED Final    Comment: (NOTE) The GeneXpert MRSA Assay (FDA approved for NASAL specimens only), is one component of a comprehensive MRSA colonization surveillance program. It is not intended to diagnose MRSA infection nor to guide or monitor treatment for MRSA infections. Test performance is not FDA approved in patients less than 81 years old. Performed at [facility], [address] [place], [place] [address]      Radiology Studies: No results found.   Scheduled Meds:  acetaminophen   1,000 mg Oral Q6H   cefadroxil   500 mg Oral BID   collagenase    Topical Daily   docusate sodium   100 mg Oral BID   donepezil   5 mg Oral QHS   [START ON 06/22/2024] doxycycline   100 mg Oral Q12H   levothyroxine   88 mcg Oral Daily   polyethylene glycol  17 g Oral BID   sodium chloride  flush  3 mL Intravenous Q12H   Continuous Infusions:      LOS: 3 days   Fredia Skeeter, MD [facility]  06/21/2024, 11:05 AM   *Please note that this is a verbal dictation therefore any spelling or grammatical errors are due to the Dragon Medical One system interpretation.  Please page via Amion and do not message via secure chat for urgent patient care matters. Secure chat can be used for non urgent patient care matters.  How to contact the [facility] Attending or Consulting provider 7A - 7P or covering provider during after hours 7P -7A, for this patient?  Check the care team in [facility] and look for a) attending/consulting [facility] provider listed and b) the [facility] team listed. Page or secure chat 7A-7P. Log into [email or web address] and use [place]'s universal password to access. If you do not have the password, please contact the hospital operator. Locate the [facility] provider you are looking for under [facility] and page to a number that you can be directly reached. If you still have difficulty reaching the provider, please page the [facility] (Director on Call) for the Hospitalists listed on amion for assistance.

## 2024-06-21 NOTE — Progress Notes (Signed)
 PT Cancellation Note  Patient Details Name: Sabrina Hart MRN: [id number] DOB: 03/22/1932   Cancelled Treatment:    Reason Eval/Treat Not Completed: (P) Other (comment). Will await GOC meeting this AM before initiating hydrotherapy. Will plan to follow-up later today as able pending GOC decisions.   Theo Ferretti, PT, DPT Acute Rehabilitation Services  Office: [phone]    Theo CHRISTELLA Ferretti 06/21/2024, 7:32 AM

## 2024-06-22 DIAGNOSIS — R6510 Systemic inflammatory response syndrome (SIRS) of non-infectious origin without acute organ dysfunction: Secondary | ICD-10-CM | POA: Diagnosis not present

## 2024-06-22 LAB — GLUCOSE, CAPILLARY
Glucose-Capillary: 83 mg/dL (ref 70–99)
Glucose-Capillary: 84 mg/dL (ref 70–99)
Glucose-Capillary: 89 mg/dL (ref 70–99)
Glucose-Capillary: 96 mg/dL (ref 70–99)
Glucose-Capillary: 96 mg/dL (ref 70–99)
Glucose-Capillary: 97 mg/dL (ref 70–99)

## 2024-06-22 MED ORDER — RISPERIDONE 0.5 MG PO TABS
0.5000 mg | ORAL_TABLET | Freq: Two times a day (BID) | ORAL | Status: DC
  Administered 2024-06-22 – 2024-06-26 (×9): 0.5 mg via ORAL
  Filled 2024-06-22 (×11): qty 1

## 2024-06-22 MED ORDER — SERTRALINE HCL 25 MG PO TABS
25.0000 mg | ORAL_TABLET | Freq: Every day | ORAL | Status: DC
  Administered 2024-06-22 – 2024-06-26 (×5): 25 mg via ORAL
  Filled 2024-06-22 (×5): qty 1

## 2024-06-22 NOTE — Progress Notes (Signed)
 Physical Therapy Wound Treatment Patient Details  Name: Sabrina Hart MRN: [id number] Date of Birth: 1932/05/11  Today's Date: 06/22/2024 Time: 8965-8875 Time Calculation (min): 50 min  Subjective  Subjective Assessment Subjective: Pt nonverbal throughout session. Patient and Family Stated Goals: did not state Date of Onset:  (unknown) Prior Treatments: dressing changes  Pain Score:  6/10 occasional moan, and attempt to move away, but largely asleep    Wound Assessment  Wound 06/18/24 1940 Pressure Injury Ischial tuberosity Anterior;Proximal;Right Unstageable - Full thickness tissue loss in which the base of the injury is covered by slough (yellow, tan, gray, green or brown) and/or eschar (tan, brown or black) in the wo (Active)  Wound Image    06/21/24 1400  Site / Wound Assessment Yellow;Black;Clean;Dry 06/22/24 1300  Peri-wound Assessment Intact;Pink 06/22/24 1300  Wound Length (cm) 9.8 cm 06/21/24 1400  Wound Width (cm) 6.5 cm 06/21/24 1400  Wound Surface Area (cm^2) 50.03 cm^2 06/21/24 1400  Wound Depth (cm) 0.2 cm 06/21/24 1400  Wound Volume (cm^3) 6.671 cm^3 06/21/24 1400  Drainage Description Purulent;Odor - foul 06/22/24 1300  Drainage Amount Scant 06/22/24 1300  Treatment Cleansed;Debridement (Selective);Packing (Saline gauze);Irrigation 06/22/24 1300  Dressing Type Gauze (Comment);ABD;Santyl ;Normal saline moist dressing;Barrier Film (skin prep) 06/22/24 1300  Dressing Changed Changed 06/22/24 1300  Dressing Status Clean, Dry, Intact 06/22/24 1300  State of Healing Non-healing 06/22/24 1300  % Wound base Red or Granulating 0% 06/22/24 1300  % Wound base Yellow/Fibrinous Exudate 50% 06/22/24 1300  % Wound base Black/Eschar 10% 06/22/24 1300  % Wound base Other/Granulation Tissue (Comment) 40% 06/22/24 1300  Tunneling (cm) 0 06/22/24 1300  Undermining (cm) Extensive cirumfrencial undermining 06/22/24 1300  Margins Unattached edges (unapproximated) 06/22/24 1300   Non-staged Wound Description Full thickness 06/22/24 1300      Selective Debridement (non-excisional) Selective Debridement (non-excisional) - Location: R ischial tuberosity Selective Debridement (non-excisional) - Tools Used: Forceps, Scalpel, Scissors Selective Debridement (non-excisional) - Tissue Removed: Black eschar, yellow and black slough, necrotic adipose tissue    Wound Assessment and Plan  Wound Therapy - Assess/Plan/Recommendations Wound Therapy - Clinical Statement: With removal of packing some green brown puss drains from the wound from 10-2 The necrotic tissue removed yesterday has opened up an extensive amount of circumfrencial undermining ~3-6 cm deep. Roof of the undermining is covered in slough, base is a mix of granulating tissue but mostly slough. PT applied Santyl  to this area. Unable to debride roof due to remaining vasculature in the skin. Base of the exposed wound is leathery necrosed muscle tissue, which was left in tact as it is instumental in keeping the joint together and covered/ PT was able to remove appreciable amount of grayish eschar from the edge of the wound. Given Wound Therapy - Functional Problem List: decreased mobility; decreased cognition Factors Delaying/Impairing Wound Healing: Incontinence, Immobility, Multiple medical problems Hydrotherapy Plan: Debridement, Dressing change, Patient/family education Wound Therapy - Frequency: 2X / week Wound Therapy - Current Recommendations: Case manager/social work, PT Wound Therapy - Follow Up Recommendations: dressing changes by family/patient  Wound Therapy Goals- Improve the function of patient's integumentary system by progressing the wound(s) through the phases of wound healing (inflammation - proliferation - remodeling) by: Wound Therapy Goals - Improve the function of patient's integumentary system by progressing the wound(s) through the phases of wound healing by: Decrease Necrotic Tissue to:  10% Decrease Necrotic Tissue - Progress: Progressing toward goal Increase Granulation Tissue to: 90% Increase Granulation Tissue - Progress: Progressing toward goal Improve Drainage Characteristics:  Serous, Min Improve Drainage Characteristics - Progress: Progressing toward goal Patient/Family will be able to : change the wound dressing independently Patient/Family Instruction Goal - Progress: Goal set today Goals/treatment plan/discharge plan were made with and agreed upon by patient/family: No, Patient unable to participate in goals/treatment/discharge plan and family unavailable Time For Goal Achievement: 7 days Wound Therapy - Potential for Goals: Good  Goals will be updated until maximal potential achieved or discharge criteria met.  Discharge criteria: when goals achieved, discharge from hospital, MD decision/surgical intervention, no progress towards goals, refusal/missing three consecutive treatments without notification or medical reason.  GP     Charges PT Wound Care Charges $Wound Debridement up to 20 cm: < or equal to 20 cm $PT Hydrotherapy Dressing: 2 dressings     Almarie B. Fleeta Lapidus PT, DPT Acute Rehabilitation Services Please use secure chat or  Call Office [phone]   Almarie KATHEE Fleeta [facility] 06/22/2024, 1:35 PM

## 2024-06-22 NOTE — Progress Notes (Addendum)
 Subjective: CC: Seen with RN. S/p hydrotherapy yesterday. No family at bedside. Discussed with them over the phone yesterday.   Afebrile. No tachycardia or systolic hypotension. WBC wnl on last check  Objective: Vital signs in last 24 hours: Temp:  [97.6 F (36.4 C)-98.4 F (36.9 C)] 98.2 F (36.8 C) (07/25 0745) Pulse Rate:  [76-97] 82 (07/25 0745) Resp:  [14-36] 16 (07/25 0745) BP: (101-121)/(45-85) 116/58 (07/25 0745) SpO2:  [97 %-100 %] 97 % (07/25 0745) Last BM Date : 06/22/24  Intake/Output from previous day: No intake/output data recorded. Intake/Output this shift: Total I/O In: 240 [P.O.:240] Out: -   PE: General: Frail elderly female lying in bed MS: Lower extremity contractures noted Skin: R hip decubitus ulcer as below in pictures. Most inferior aspect of wound (orientation of picture) with healthy granulation tissue. Most of the visible portion of the wound with mixture of adipose tissue and what appears and feels like muscle/fascia. 6cm underminding on the superior aspect of the wound marked with sterile cotton swab in picture 2. No loculations able to be broken up. No drainage. Periwound with pale pink tissue injury. Left most outer edge still with some non-viable tissue as seen in picture. There is no erythema, heat, induration, fluctuance or drainage.         Lab Results:  Recent Labs    06/20/24 0604  WBC 8.3  HGB 10.0*  HCT 30.4*  PLT 296   BMET Recent Labs    06/20/24 0604  NA 140  K 3.7  CL 106  CO2 23  GLUCOSE 125*  BUN 12  CREATININE 0.47  CALCIUM  8.3*   PT/INR No results for input(s): LABPROT, INR in the last 72 hours. CMP     Component Value Date/Time   NA 140 06/20/2024 0604   K 3.7 06/20/2024 0604   CL 106 06/20/2024 0604   CO2 23 06/20/2024 0604   GLUCOSE 125 (H) 06/20/2024 0604   BUN 12 06/20/2024 0604   CREATININE 0.47 06/20/2024 0604   CREATININE 0.76 09/25/2020 1634   CALCIUM  8.3 (L) 06/20/2024 0604    PROT 5.9 (L) 06/19/2024 0554   ALBUMIN 1.9 (L) 06/19/2024 0554   AST 26 06/19/2024 0554   ALT 16 06/19/2024 0554   ALKPHOS 81 06/19/2024 0554   BILITOT 0.8 06/19/2024 0554   GFRNONAA >60 06/20/2024 0604   GFRNONAA 71 09/25/2020 1634   GFRAA 82 09/25/2020 1634   Lipase  No results found for: LIPASE  Studies/Results: No results found.  Anti-infectives: Anti-infectives (From admission, onward)    Start     Dose/Rate Route Frequency Ordered Stop   06/22/24 1000  doxycycline  (VIBRA -TABS) tablet 100 mg        100 mg Oral Every 12 hours 06/20/24 0937     06/20/24 1200  vancomycin  (VANCOCIN ) IVPB 1000 mg/200 mL premix  Status:  Discontinued        1,000 mg 200 mL/hr over 60 Minutes Intravenous Every 48 hours 06/18/24 1432 06/19/24 1540   06/20/24 1030  cefadroxil  (DURICEF) capsule 500 mg        500 mg Oral 2 times daily 06/20/24 0937     06/20/24 0600  vancomycin  (VANCOREADY) IVPB 1250 mg/250 mL  Status:  Discontinued        1,250 mg 166.7 mL/hr over 90 Minutes Intravenous Every 48 hours 06/19/24 1540 06/20/24 0944   06/19/24 1600  cefTRIAXone  (ROCEPHIN ) 2 g in sodium chloride  0.9 % 100 mL IVPB  Status:  Discontinued        2 g 200 mL/hr over 30 Minutes Intravenous Every 24 hours 06/19/24 1417 06/20/24 0944   06/18/24 2000  aztreonam  (AZACTAM ) 1 g in sodium chloride  0.9 % 100 mL IVPB  Status:  Discontinued        1 g 200 mL/hr over 30 Minutes Intravenous Every 8 hours 06/18/24 1432 06/19/24 1417   06/18/24 1015  aztreonam  (AZACTAM ) 2 g in sodium chloride  0.9 % 100 mL IVPB        2 g 200 mL/hr over 30 Minutes Intravenous  Once 06/18/24 1008 06/18/24 1126   06/18/24 1015  vancomycin  (VANCOCIN ) IVPB 1000 mg/200 mL premix  Status:  Discontinued        1,000 mg 200 mL/hr over 60 Minutes Intravenous  Once 06/18/24 1008 06/18/24 1014   06/18/24 1015  vancomycin  (VANCOREADY) IVPB 1250 mg/250 mL        1,250 mg 166.7 mL/hr over 90 Minutes Intravenous  Once 06/18/24 1014 06/18/24 1321         Assessment/Plan R hip decubitus ulcer - S/p Hydrotherapy - BID WTD - Recommend air mattress, frequent turns and offloading - Do not recommend debridement in the OR - We will sign off. Please call back with questions, concerns or changes.  FEN - Okay for diet from our standpoint.  VTE - SCDs, Eliquis  ID - Abx per primary in setting of bacteremia and first metatarsal phalangeal joint with underlying abscess and osteo  I reviewed nursing notes, last 24 h vitals and pain scores, last 48 h intake and output, last 24 h labs and trends, and last 24 h imaging results.   LOS: 4 days    Sabrina Hart, [facility] 06/22/2024, 10:41 AM Please see Amion for pager number during day hours 7:00am-4:30pm

## 2024-06-22 NOTE — Progress Notes (Signed)
 PROGRESS NOTE    Sabrina Hart  FMW:[id number] DOB: 13-Jan-1932 DOA: 06/18/2024 PCP: [name], MD   Brief Narrative:  Sabrina Hart is a 89 y.o. female with medical history significant of hypertension, hyperlipidemia, hypothyroidism, CAD status post stent, dementia, glaucoma, DVT on Eliquis  presented with confusion, wound changes, constipation.   Patient has a chronic decubitus ulcer on her bilateral gluteal regions as well as her metatarsal regions.  She has not followed outpatient and wounds have been healing.  There has been some increased seepage after starting Eliquis  for DVT. Including bloody drainage.  There is also been some nosebleeding and some intermittent blood in the mouth in the morning possibly due to nosebleeding that is being swallowed/pooling in the mouth. Patient is wheelchair-bound at baseline.  Family also notes a decline in mentation and oral activity for the past several weeks as well.  CT abdomen pelvis showed crescentic abscess at the right hip measuring 12 x 2 x 12 cm, prominent stool favoring constipation, mesenteric edema unable to exclude small ascites, severe arthropathy of the hips possible synovitis in the right hip.  Right kidney lesion favoring a cyst but is indeterminant MRI recommended for follow-up versus CT with and without contrast. Patient received vancomycin , aztreonam  in the ED in the setting of penicillin allergy.  Orthopedics consulted.  Was also diagnosed with right foot abscess and osteomyelitis.  Details below.  Assessment & Plan:   Principal Problem:   SIRS (systemic inflammatory response syndrome) (HCC) Active Problems:   CAD (coronary artery disease)   Hypothyroidism   Hyperlipidemia   Severe late onset Alzheimer's dementia (HCC)   Essential hypertension   Primary open angle glaucoma (POAG) of both eyes   Pressure injury of right hip, unstageable (HCC)   Gangrene of right foot (HCC)   Pressure injury of skin  Severe sepsis  secondary to infected decubitus ulcers, right hip abscess and abscess as well as osteomyelitis of the first metatarsal head of the right foot, POA: Patient met sepsis criteria based on tachypnea and leukocytosis and lactic acid of 3.7. > CT abdomen pelvis showed crescentic abscess at the right hip measuring 12 x 2 x 12 cm, prominent stool favoring constipation, mesenteric edema unable to exclude small ascites, severe arthropathy of the hips possible synovitis in the right hip.  > Certainly has abscess at the right hip and concern for synovitis.  Lower extremity wounds with some infection.  Wound care consulted. Patient seen by Dr. Harden, plan for bilateral ABI.  Per him, patient would not be candidate for endovascular evaluation due to her flexion contractures.  And he did not feel that there is a surgical intervention that he could proceed with safely to debride the large decubitus ulcers on the right hip. Eventually MRI of the right foot showed Large area of soft tissue ulceration medial to the 1st metatarsophalangeal joint with underlying abscess and underlying osteomyelitis of the 1st metatarsal head, its sesamoids and base of the 1st proximal phalanx.  Patient started on vancomycin  and aztreonam  due to penicillin allergy however later transitioned to Rocephin  as per pharmacy, and eventually transitioned to oral Duricef by Dr. Harden 06/20/2024.  No fever.  Blood culture negative.  Lactic acidosis resolved. Family wanted the wounds to be debribed but they do not want any amputation for the right foot.  Dr. Harden does not feel that he can do that safely, per family's request, he sought a second opinion General Surgery and general surgery also does not think that  debridement can be done safely for this patient.  They recommended palliative care.  Family had a meeting with palliative care yesterday, consensus is to continue current scope of care, continue hydrotherapy and wound care and allow some time along with  continuation of the antibiotics.  Palliative care will see patient again tomorrow.  Acute septic encephalopathy, POA: Improved, she is at baseline.  Right kidney lesion favoring a cyst but is indeterminant MRI recommended for follow-up versus CT with and without contrast.  Calcified uterus.  Sensitivity lower due to patient in the left lateral decubitus.  Hypoglycemia: 47 initially, required dextrose  fluid.  Now on diet, sugar controlled.  Constipation: MiraLAX  and Fleet enema and Colace ordered.  Constipation resolved.  AKI: Likely ATN due to severe sepsis.  Resolved with IV fluids  Hypokalemia: Resolved.  Mild hyponatremia: Monitor.   Hypertension - Holding home amlodipine  in the setting of above, still hypotensive.   Hyperlipidemia - Atorvastatin  held outpatient   CAD > Status post stents - ContinueEliquis   Hypothyroidism - Continue home Synthroid    Glaucoma - Continue eyedrops once confirmed   History of DVT Initially held but resumed Eliquis  yesterday now that there are no plans for surgery.   Dementia - Continue home donepezil , risperidone , Ativan , sertraline  once confirmed  DVT prophylaxis: Place and maintain sequential compression device Start: 06/18/24 1339   Code Status: Limited: Do not attempt resuscitation (DNR) -DNR-LIMITED -Do Not Intubate/DNI   Family Communication: None present at bedside.   Status is: Inpatient Remains inpatient appropriate because: Needs continuation of the wound care/hydrotherapy.   Estimated body mass index is 19.37 kg/m as calculated from the following:   Height as of this encounter: 5' 3 (1.6 m).   Weight as of this encounter: 49.6 kg.    Nutritional Assessment: Body mass index is 19.37 kg/m.[name] Seen by dietician.  I agree with the assessment and plan as outlined below: Nutrition Status:   . Skin Assessment: I have examined the patient's skin and I agree with the wound assessment as performed by the wound care RN as  outlined below:    Consultants:  Orthopedics General Surgery Palliative care Procedures:  None so far  Antimicrobials:  Anti-infectives (From admission, onward)    Start     Dose/Rate Route Frequency Ordered Stop   06/22/24 1000  doxycycline  (VIBRA -TABS) tablet 100 mg        100 mg Oral Every 12 hours 06/20/24 0937     06/20/24 1200  vancomycin  (VANCOCIN ) IVPB 1000 mg/200 mL premix  Status:  Discontinued        1,000 mg 200 mL/hr over 60 Minutes Intravenous Every 48 hours 06/18/24 1432 06/19/24 1540   06/20/24 1030  cefadroxil  (DURICEF) capsule 500 mg        500 mg Oral 2 times daily 06/20/24 0937     06/20/24 0600  vancomycin  (VANCOREADY) IVPB 1250 mg/250 mL  Status:  Discontinued        1,250 mg 166.7 mL/hr over 90 Minutes Intravenous Every 48 hours 06/19/24 1540 06/20/24 0944   06/19/24 1600  cefTRIAXone  (ROCEPHIN ) 2 g in sodium chloride  0.9 % 100 mL IVPB  Status:  Discontinued        2 g 200 mL/hr over 30 Minutes Intravenous Every 24 hours 06/19/24 1417 06/20/24 0944   06/18/24 2000  aztreonam  (AZACTAM ) 1 g in sodium chloride  0.9 % 100 mL IVPB  Status:  Discontinued        1 g 200 mL/hr over 30 Minutes  Intravenous Every 8 hours 06/18/24 1432 06/19/24 1417   06/18/24 1015  aztreonam  (AZACTAM ) 2 g in sodium chloride  0.9 % 100 mL IVPB        2 g 200 mL/hr over 30 Minutes Intravenous  Once 06/18/24 1008 06/18/24 1126   06/18/24 1015  vancomycin  (VANCOCIN ) IVPB 1000 mg/200 mL premix  Status:  Discontinued        1,000 mg 200 mL/hr over 60 Minutes Intravenous  Once 06/18/24 1008 06/18/24 1014   06/18/24 1015  vancomycin  (VANCOREADY) IVPB 1250 mg/250 mL        1,250 mg 166.7 mL/hr over 90 Minutes Intravenous  Once 06/18/24 1014 06/18/24 1321         Subjective: Patient seen and examined, no family present at bedside.  Patient alert and oriented to self and very pleasant as usual.  No complaints.  Objective: Vitals:   06/21/24 2200 06/22/24 0000 06/22/24 0329 06/22/24  0745  BP: (!) 101/55 (!) 111/55 (!) 117/54 (!) 116/58  Pulse:  76 79 82  Resp: 14 17 14 16   Temp:  98.4 F (36.9 C) 98.2 F (36.8 C) 98.2 F (36.8 C)  TempSrc:  Oral Oral Oral  SpO2:  100% 100% 97%  Weight:      Height:        Intake/Output Summary (Last 24 hours) at 06/22/2024 0912 Last data filed at 06/22/2024 0909 Gross per 24 hour  Intake 240 ml  Output --  Net 240 ml   Filed Weights   06/18/24 0936 06/18/24 1938  Weight: 52.2 kg 49.6 kg    Examination:  General exam: Appears calm and comfortable  Respiratory system: Clear to auscultation. Respiratory effort normal. Cardiovascular system: S1 & S2 heard, RRR. No JVD, murmurs, rubs, gallops or clicks. No pedal edema. Gastrointestinal system: Abdomen is nondistended, soft and nontender. No organomegaly or masses felt. Normal bowel sounds heard. Central nervous system: Alert and oriented x 1.  No focal deficit but she has contractures in the lower extremities. Extremities: Symmetric 5 x 5 power. Skin: Wounds at bilateral hips.  Had dressing.  Data Reviewed: I have personally reviewed following labs and imaging studies  CBC: Recent Labs  Lab 06/18/24 0940 06/19/24 0554 06/20/24 0604  WBC 12.3* 8.7 8.3  NEUTROABS 10.6*  --  6.9  HGB 12.5 10.1* 10.0*  HCT 38.3 31.1* 30.4*  MCV 86.7 86.4 86.4  PLT 440* 371 296   Basic Metabolic Panel: Recent Labs  Lab 06/18/24 0940 06/19/24 0554 06/20/24 0604  NA 135 134* 140  K 3.4* 2.9* 3.7  CL 98 98 106  CO2 26 25 23   GLUCOSE 158* 89 125*  BUN 39* 22 12  CREATININE 1.15* 0.56 0.47  CALCIUM  9.0 8.5* 8.3*  MG 2.3  --   --    GFR: Estimated Creatinine Clearance: 36.6 mL/min (by C-G formula based on SCr of 0.47 mg/dL). Liver Function Tests: Recent Labs  Lab 06/18/24 0940 06/19/24 0554  AST 39 26  ALT 20 16  ALKPHOS 104 81  BILITOT 1.0 0.8  PROT 7.4 5.9*  ALBUMIN 2.3* 1.9*   No results for input(s): LIPASE, AMYLASE in the last 168 hours. No results for  input(s): AMMONIA in the last 168 hours. Coagulation Profile: No results for input(s): INR, PROTIME in the last 168 hours. Cardiac Enzymes: No results for input(s): CKTOTAL, CKMB, CKMBINDEX, TROPONINI in the last 168 hours. BNP (last 3 results) No results for input(s): PROBNP in the last 8760 hours. HbA1C: No results  for input(s): HGBA1C in the last 72 hours. CBG: Recent Labs  Lab 06/21/24 1731 06/21/24 1944 06/22/24 0001 06/22/24 0326 06/22/24 0744  GLUCAP 126* 109* 89 84 83   Lipid Profile: No results for input(s): CHOL, HDL, LDLCALC, TRIG, CHOLHDL, LDLDIRECT in the last 72 hours. Thyroid  Function Tests: No results for input(s): TSH, T4TOTAL, FREET4, T3FREE, THYROIDAB in the last 72 hours. Anemia Panel: No results for input(s): VITAMINB12, FOLATE, FERRITIN, TIBC, IRON, RETICCTPCT in the last 72 hours. Sepsis Labs: Recent Labs  Lab 06/18/24 0950 06/18/24 1141 06/20/24 0906  LATICACIDVEN 1.9 3.7* 1.6    Recent Results (from the past 240 hours)  Urine Culture     Status: None   Collection Time: 06/18/24  1:21 AM   Specimen: Urine, Random  Result Value Ref Range Status   Specimen Description URINE, RANDOM  Final   Special Requests URINE, CLEAN CATCH  Final   Culture   Final    NO GROWTH Performed at [facility], [address] [place], [place] [address]    Report Status 06/20/2024 FINAL  Final  Culture, blood (routine x 2)     Status: None (Preliminary result)   Collection Time: 06/18/24 10:30 AM   Specimen: BLOOD RIGHT ARM  Result Value Ref Range Status   Specimen Description BLOOD RIGHT ARM  Final   Special Requests   Final    BOTTLES DRAWN AEROBIC AND ANAEROBIC Blood Culture adequate volume   Culture   Final    NO GROWTH 4 DAYS Performed at [facility], [address] [place], [place] [address]    Report Status PENDING  Incomplete  Culture, blood (routine x 2)     Status: Abnormal    Collection Time: 06/18/24 10:35 AM   Specimen: BLOOD LEFT ARM  Result Value Ref Range Status   Specimen Description BLOOD LEFT ARM  Final   Special Requests   Final    BOTTLES DRAWN AEROBIC AND ANAEROBIC Blood Culture adequate volume   Culture  Setup Time   Final    GRAM POSITIVE COCCI IN CLUSTERS ANAEROBIC BOTTLE ONLY CRITICAL RESULT CALLED TO, READ BACK BY AND VERIFIED WITH: PHARMD EMILY SINCLAIR ON 06/19/24 @ 1516 BY DRT    Culture (A)  Final    STAPHYLOCOCCUS CAPITIS THE SIGNIFICANCE OF ISOLATING THIS ORGANISM FROM A SINGLE SET OF BLOOD CULTURES WHEN MULTIPLE SETS ARE DRAWN IS UNCERTAIN. PLEASE NOTIFY THE MICROBIOLOGY DEPARTMENT WITHIN ONE WEEK IF SPECIATION AND SENSITIVITIES ARE REQUIRED. Performed at [facility], [address], [place], [place] [address]    Report Status 06/21/2024 FINAL  Final  Blood Culture ID Panel (Reflexed)     Status: Abnormal   Collection Time: 06/18/24 10:35 AM  Result Value Ref Range Status   Enterococcus faecalis NOT DETECTED NOT DETECTED Final   Enterococcus Faecium NOT DETECTED NOT DETECTED Final   Listeria monocytogenes NOT DETECTED NOT DETECTED Final   Staphylococcus species DETECTED (A) NOT DETECTED Final    Comment: CRITICAL RESULT CALLED TO, READ BACK BY AND VERIFIED WITH: PHARMD EMILY SINCLAIR ON 06/19/24 @ 1516 BY DRT    Staphylococcus aureus (BCID) NOT DETECTED NOT DETECTED Final   Staphylococcus epidermidis NOT DETECTED NOT DETECTED Final   Staphylococcus lugdunensis NOT DETECTED NOT DETECTED Final   Streptococcus species NOT DETECTED NOT DETECTED Final   Streptococcus agalactiae NOT DETECTED NOT DETECTED Final   Streptococcus pneumoniae NOT DETECTED NOT DETECTED Final   Streptococcus pyogenes NOT DETECTED NOT DETECTED Final  A.calcoaceticus-baumannii NOT DETECTED NOT DETECTED Final   Bacteroides fragilis NOT DETECTED NOT DETECTED Final   Enterobacterales NOT DETECTED NOT DETECTED Final   Enterobacter cloacae complex NOT DETECTED  NOT DETECTED Final   Escherichia coli NOT DETECTED NOT DETECTED Final   Klebsiella aerogenes NOT DETECTED NOT DETECTED Final   Klebsiella oxytoca NOT DETECTED NOT DETECTED Final   Klebsiella pneumoniae NOT DETECTED NOT DETECTED Final   Proteus species NOT DETECTED NOT DETECTED Final   Salmonella species NOT DETECTED NOT DETECTED Final   Serratia marcescens NOT DETECTED NOT DETECTED Final   Haemophilus influenzae NOT DETECTED NOT DETECTED Final   Neisseria meningitidis NOT DETECTED NOT DETECTED Final   Pseudomonas aeruginosa NOT DETECTED NOT DETECTED Final   Stenotrophomonas maltophilia NOT DETECTED NOT DETECTED Final   Candida albicans NOT DETECTED NOT DETECTED Final   Candida auris NOT DETECTED NOT DETECTED Final   Candida glabrata NOT DETECTED NOT DETECTED Final   Candida krusei NOT DETECTED NOT DETECTED Final   Candida parapsilosis NOT DETECTED NOT DETECTED Final   Candida tropicalis NOT DETECTED NOT DETECTED Final   Cryptococcus neoformans/gattii NOT DETECTED NOT DETECTED Final    Comment: Performed at [facility], [address] [place], [place] [address]  MRSA Next Gen by PCR, Nasal     Status: None   Collection Time: 06/18/24  8:47 PM   Specimen: Nasal Mucosa; Nasal Swab  Result Value Ref Range Status   MRSA by PCR Next Gen NOT DETECTED NOT DETECTED Final    Comment: (NOTE) The GeneXpert MRSA Assay (FDA approved for NASAL specimens only), is one component of a comprehensive MRSA colonization surveillance program. It is not intended to diagnose MRSA infection nor to guide or monitor treatment for MRSA infections. Test performance is not FDA approved in patients less than 36 years old. Performed at [facility], [address], [place], [place] [address]      Radiology Studies: No results found.   Scheduled Meds:  acetaminophen   1,000 mg Oral Q6H   apixaban   5 mg Oral BID   cefadroxil   500 mg Oral BID   collagenase    Topical Daily   docusate sodium   100  mg Oral BID   donepezil   5 mg Oral QHS   doxycycline   100 mg Oral Q12H   levothyroxine   88 mcg Oral Daily   polyethylene glycol  17 g Oral BID   sodium chloride  flush  3 mL Intravenous Q12H   Continuous Infusions:     LOS: 4 days   Fredia Skeeter, MD [facility]  06/22/2024, 9:12 AM   *Please note that this is a verbal dictation therefore any spelling or grammatical errors are due to the Dragon [employer] One system interpretation.  Please page via Amion and do not message via secure chat for urgent patient care matters. Secure chat can be used for non urgent patient care matters.  How to contact the [facility] Attending or Consulting provider 7A - 7P or covering provider during after hours 7P -7A, for this patient?  Check the care team in [facility] and look for a) attending/consulting [facility] provider listed and b) the [facility] team listed. Page or secure chat 7A-7P. Log into [email or web address] and use [facility]'s universal password to access. If you do not have the password, please contact the hospital operator. Locate the [facility] provider you are looking for under [facility] and page to a number that you can be directly reached. If you still have difficulty reaching  the provider, please page the [facility] (Director on Call) for the Hospitalists listed on amion for assistance.

## 2024-06-23 DIAGNOSIS — I96 Gangrene, not elsewhere classified: Secondary | ICD-10-CM | POA: Diagnosis not present

## 2024-06-23 DIAGNOSIS — A419 Sepsis, unspecified organism: Secondary | ICD-10-CM | POA: Diagnosis not present

## 2024-06-23 DIAGNOSIS — R6510 Systemic inflammatory response syndrome (SIRS) of non-infectious origin without acute organ dysfunction: Secondary | ICD-10-CM | POA: Diagnosis not present

## 2024-06-23 DIAGNOSIS — G301 Alzheimer's disease with late onset: Secondary | ICD-10-CM | POA: Diagnosis not present

## 2024-06-23 LAB — CBC WITH DIFFERENTIAL/PLATELET
Abs Immature Granulocytes: 0.03 K/uL (ref 0.00–0.07)
Basophils Absolute: 0 K/uL (ref 0.0–0.1)
Basophils Relative: 0 %
Eosinophils Absolute: 0.1 K/uL (ref 0.0–0.5)
Eosinophils Relative: 2 %
HCT: 30.5 % — ABNORMAL LOW (ref 36.0–46.0)
Hemoglobin: 10 g/dL — ABNORMAL LOW (ref 12.0–15.0)
Immature Granulocytes: 1 %
Lymphocytes Relative: 27 %
Lymphs Abs: 1.3 K/uL (ref 0.7–4.0)
MCH: 28 pg (ref 26.0–34.0)
MCHC: 32.8 g/dL (ref 30.0–36.0)
MCV: 85.4 fL (ref 80.0–100.0)
Monocytes Absolute: 0.5 K/uL (ref 0.1–1.0)
Monocytes Relative: 10 %
Neutro Abs: 2.8 K/uL (ref 1.7–7.7)
Neutrophils Relative %: 60 %
Platelets: 371 K/uL (ref 150–400)
RBC: 3.57 MIL/uL — ABNORMAL LOW (ref 3.87–5.11)
RDW: 13.9 % (ref 11.5–15.5)
WBC: 4.7 K/uL (ref 4.0–10.5)
nRBC: 0 % (ref 0.0–0.2)

## 2024-06-23 LAB — GLUCOSE, CAPILLARY
Glucose-Capillary: 102 mg/dL — ABNORMAL HIGH (ref 70–99)
Glucose-Capillary: 102 mg/dL — ABNORMAL HIGH (ref 70–99)
Glucose-Capillary: 104 mg/dL — ABNORMAL HIGH (ref 70–99)
Glucose-Capillary: 75 mg/dL (ref 70–99)
Glucose-Capillary: 77 mg/dL (ref 70–99)
Glucose-Capillary: 85 mg/dL (ref 70–99)

## 2024-06-23 LAB — CK: Total CK: 16 U/L — ABNORMAL LOW (ref 38–234)

## 2024-06-23 MED ORDER — SODIUM CHLORIDE 0.9 % IV SOLN
2.0000 g | INTRAVENOUS | Status: DC
  Administered 2024-06-23 – 2024-06-25 (×3): 2 g via INTRAVENOUS
  Filled 2024-06-23 (×3): qty 20

## 2024-06-23 MED ORDER — FENTANYL CITRATE PF 50 MCG/ML IJ SOSY: 25.0000 ug | PREFILLED_SYRINGE | Freq: Every day | INTRAMUSCULAR | Status: DC | PRN

## 2024-06-23 MED ORDER — METRONIDAZOLE 500 MG PO TABS
500.0000 mg | ORAL_TABLET | Freq: Two times a day (BID) | ORAL | Status: DC
  Administered 2024-06-23 – 2024-06-26 (×6): 500 mg via ORAL
  Filled 2024-06-23 (×6): qty 1

## 2024-06-23 MED ORDER — DAPTOMYCIN-SODIUM CHLORIDE 500-0.9 MG/50ML-% IV SOLN
500.0000 mg | Freq: Every day | INTRAVENOUS | Status: DC
  Filled 2024-06-23: qty 50

## 2024-06-23 MED ORDER — SODIUM CHLORIDE 0.9 % IV SOLN
400.0000 mg | Freq: Every day | INTRAVENOUS | Status: DC
  Administered 2024-06-23 – 2024-06-25 (×3): 400 mg via INTRAVENOUS
  Filled 2024-06-23 (×5): qty 8

## 2024-06-23 MED ORDER — ENOXAPARIN SODIUM 60 MG/0.6ML IJ SOSY
50.0000 mg | PREFILLED_SYRINGE | Freq: Two times a day (BID) | INTRAMUSCULAR | Status: DC
  Administered 2024-06-24 – 2024-06-26 (×5): 50 mg via SUBCUTANEOUS
  Filled 2024-06-23 (×7): qty 0.6

## 2024-06-23 NOTE — Consult Note (Addendum)
 [facility]                                                                                        Patient Identification: Patient Name: Sabrina Hart MRN: [id number] Admit Date: 06/18/2024  9:26 AM Today's Date: 06/23/2024 Reason for consult: abscess, osteomyelitis Requesting provider: Dr. Kathrin  Principal Problem:   SIRS (systemic inflammatory response syndrome) (HCC) Active Problems:   CAD (coronary artery disease)   Hypothyroidism   Hyperlipidemia   Severe late onset Alzheimer's dementia (HCC)   Essential hypertension   Primary open angle glaucoma (POAG) of both eyes   Pressure injury of right hip, unstageable (HCC)   Gangrene of right foot (HCC)   Pressure injury of skin   Antibiotics:  Aztreonam  7/21-7/22 Vancomycin  7/21-7/22 Doxycycline  7/25- Ceftriaxone  7/22, cefadroxil  7/23-  Lines/Hardware: Right brachial midline  Assessment # Multiple decubitus pressure wounds (right medial foot, right lateral foot, right ankle, right knee, right trochanter, left trochanter)  # Abscess and osteomyelitis of the first metatarsal head, sesamoids, base of the first proximal phalanx, first MTP septic arthritis  # Right hip abscess measuring 12 x 2 x 12 cm with possible septic arthritis - has been considered non surgical candidate by orthopedics as well as general surgery  # Staph capitis 1 out of 4 bottles-likely contaminant  # Dementia # Wheel chair bund  Recommendations  - Will consult IR for drainage of right hip abscess to help with some source control if congruent with goals of care.  Unlikely antibiotics would cure without source control even with long duration  - Will change antibiotics to IV daptomycin , PO metronidazole  and ceftriaxone .  Per chart patient has tolerated ceftriaxone  in the past as well as cefadroxil  now.  - Monitor CBC, CMP and CPK - Continue wound care -  Maintain standard precautions D.w primary  I am here this weekend.  New ID team starting 7/27  Rest of the management as per the primary team. Please call with questions or concerns.  Thank you for the consult  __________________________________________________________________________________________________________ HPI and Hospital Course(history obtained from chart as patient is demented, no family bedside) 89 year old female with prior history of HTN, HLD, arthritis, glaucoma, hypothyroidism, CAD s/p stent, dementia, DVT on AC, wheelchair-bound at baseline who presented to the ED on 7/21 with altered mental status, constipation and evaluation of chronic bilateral gluteal decubitus ulcer as well as right foot due to increased bloody drainage after starting Eliquis  for DVT.  Patient had poor  oral intake PTA  At the ED afebrile Labs remarkable for K3.4, AKI with creatinine 1.15, initial lactic acid 1.9>3.7>1.6, WBC 12.3 Blood cx 1/4 staph capitis   Received vancomycin , aztreonam  in the ED in the setting of penicillin allergy as well as IVF and fentanyl    ROS: Unable to obtain as patient is demented   Past Medical History:  Diagnosis Date   Anginal pain (HCC)    1990's; 09/28/2012   Arthritis    very mild (09/28/2012)   Coronary artery disease 09/27/2012   s/p PCI of the RCA with DES with remote PCI in [place] 15 to 20 years ago.  2. cath 02/2013 patent mid RCA stent, 30% left main, 70% mid to distaal tanden 70% lesions x 3, 60% mid diag, 50% ostial LCx, 40% mid LCx.    Glaucoma    Hyperlipidemia    Hypothyroidism    Past Surgical History:  Procedure Laterality Date   CARDIAC CATHETERIZATION  1990s   PCI   CATARACT EXTRACTION W/ INTRAOCULAR LENS  IMPLANT, BILATERAL  1970's   CORONARY ANGIOPLASTY WITH STENT PLACEMENT  09/28/2012   20-30% diffuse LAD stenosis, mild-mod D1 dz, 70% distal D1 lesion, mild diffuse LCx dz, 50-60% OM1 stenosis, 60% mid & 90% mid-dis RCA stenosis  s/p DES; LVEF 75-80% w/ hyperdynamic fxn.    DENTAL SURGERY     implants   INNER EAR SURGERY  ~ 2010   put plugs in so I could fly (09/28/2012)   LEFT HEART CATHETERIZATION WITH CORONARY ANGIOGRAM N/A 09/28/2012   Procedure: LEFT HEART CATHETERIZATION WITH CORONARY ANGIOGRAM;  Surgeon: Aleene JINNY Passe, MD;  Location: [facility] CATH LAB;  Service: Cardiovascular;  Laterality: N/A;   LUMBAR DISC SURGERY  1980's   took out a little piece (09/28/2012)   PERCUTANEOUS CORONARY STENT INTERVENTION (PCI-S)  09/28/2012   Procedure: PERCUTANEOUS CORONARY STENT INTERVENTION (PCI-S);  Surgeon: Aleene JINNY Passe, MD;  Location: [facility] CATH LAB;  Service: Cardiovascular;;   REDUCTION MAMMAPLASTY  1970's    Scheduled Meds:  acetaminophen   1,000 mg Oral Q6H   apixaban   5 mg Oral BID   cefadroxil   500 mg Oral BID   collagenase    Topical Daily   docusate sodium   100 mg Oral BID   donepezil   5 mg Oral QHS   doxycycline   100 mg Oral Q12H   levothyroxine   88 mcg Oral Daily   polyethylene glycol  17 g Oral BID   risperiDONE   0.5 mg Oral BID   sertraline   25 mg Oral Daily   sodium chloride  flush  3 mL Intravenous Q12H   Continuous Infusions: PRN Meds:.sodium chloride  flush, sorbitol   Allergies  Allergen Reactions   Other Other (See Comments)    Metals; skin gets weepy; gets worse if it makes contact (09/28/2012)   Pollen Extract Other (See Comments)    sneezing; watery eyes   Latex Itching, Swelling and Other (See Comments)    Skin swells   Morphine And Codeine Rash and Other (See Comments)    Patient does not prefer to take these, but if absolutely necessary, accommodations can be made.   Nickel Other (See Comments)    Causes weepiness (skin)   Penicillins Rash    Tolerates ceftriaxone  and cefadroxil    Social History   Socioeconomic History   Marital status: Single    Spouse name: Not on file   Number of children: Not on file   Years of education: Not on file   Highest education level: Not  on file  Occupational History   Not on file  Tobacco Use   Smoking status: Never   Smokeless tobacco: Never  Vaping Use   Vaping status: Never Used  Substance and Sexual Activity   Alcohol  use: Yes    Alcohol /week: 1.0 standard drink of alcohol     Types: 1 Glasses of wine per week    Comment: 09/28/2012 1 glass of wine averages once/wk; beer average once/week during summer w/friends   Drug use: No   Sexual activity: Never  Other Topics Concern   Not on file  Social History Narrative   Not on file   Social Drivers  of Health   Financial Resource Strain: Not on file  Food Insecurity: No Food Insecurity (06/18/2024)   Hunger Vital Sign    Worried About Running Out of Food in the Last Year: Never true    Ran Out of Food in the Last Year: Never true  Transportation Needs: No Transportation Needs (06/18/2024)   PRAPARE - Administrator, Civil Service (Medical): No    Lack of Transportation (Non-Medical): No  Physical Activity: Not on file  Stress: Not on file  Social Connections: Unknown (06/18/2024)   Social Connection and Isolation Panel    Frequency of Communication with Friends and Family: More than three times a week    Frequency of Social Gatherings with Friends and Family: More than three times a week    Attends Religious Services: Patient unable to answer    Active Member of Clubs or Organizations: Patient unable to answer    Attends Banker Meetings: Patient unable to answer    Marital Status: Patient unable to answer  Intimate Partner Violence: Patient Unable To Answer (06/19/2024)   Humiliation, Afraid, Rape, and Kick questionnaire    Fear of Current or Ex-Partner: Patient unable to answer    Emotionally Abused: Patient unable to answer    Physically Abused: Patient unable to answer    Sexually Abused: Patient unable to answer   History reviewed. No pertinent family history.   Vitals BP 101/88 (BP Location: Left Arm)   Pulse 76   Temp  97.9 F (36.6 C) (Axillary)   Resp 14   Ht 5' 3 (1.6 m)   Wt 49.6 kg   SpO2 99%   BMI 19.37 kg/m    Physical Exam Constitutional:  lying in the bed, appears comfortable    Comments: HEENT WNL  Cardiovascular:     Rate and Rhythm: Normal rate and regular rhythm.     Heart sounds:   Pulmonary:     Effort: Pulmonary effort is normal.     Comments:   Abdominal:     Palpations: Abdomen is soft.     Tenderness:   Musculoskeletal:        General: Contracted lower extremities  Skin:    Comments: Right hip wound, right medial foot wound, right lateral foot wound in bandage C/D/I.  Did not open bandage  Neurological:     General: Oriented to self unable to assess any focal deficit   Pertinent Microbiology Results for orders placed or performed during the hospital encounter of 06/18/24  Urine Culture     Status: None   Collection Time: 06/18/24  1:21 AM   Specimen: Urine, Random  Result Value Ref Range Status   Specimen Description URINE, RANDOM  Final   Special Requests URINE, CLEAN CATCH  Final   Culture   Final    NO GROWTH Performed at [facility], [address], [place], [place] [address]    Report Status 06/20/2024 FINAL  Final  Culture, blood (routine x 2)     Status: None (Preliminary result)   Collection Time: 06/18/24 10:30 AM   Specimen: BLOOD RIGHT ARM  Result Value Ref Range Status   Specimen Description BLOOD RIGHT ARM  Final   Special Requests   Final    BOTTLES DRAWN AEROBIC AND ANAEROBIC Blood Culture adequate volume   Culture   Final    NO GROWTH 4 DAYS Performed at [facility], [address], [place], [place] [address]  Report Status PENDING  Incomplete  Culture, blood (routine x 2)     Status: Abnormal   Collection Time: 06/18/24 10:35 AM   Specimen: BLOOD LEFT ARM  Result Value Ref Range Status   Specimen Description BLOOD LEFT ARM  Final   Special Requests   Final    BOTTLES DRAWN AEROBIC AND ANAEROBIC Blood Culture  adequate volume   Culture  Setup Time   Final    GRAM POSITIVE COCCI IN CLUSTERS ANAEROBIC BOTTLE ONLY CRITICAL RESULT CALLED TO, READ BACK BY AND VERIFIED WITH: PHARMD EMILY SINCLAIR ON 06/19/24 @ 1516 BY DRT    Culture (A)  Final    STAPHYLOCOCCUS CAPITIS THE SIGNIFICANCE OF ISOLATING THIS ORGANISM FROM A SINGLE SET OF BLOOD CULTURES WHEN MULTIPLE SETS ARE DRAWN IS UNCERTAIN. PLEASE NOTIFY THE MICROBIOLOGY DEPARTMENT WITHIN ONE WEEK IF SPECIATION AND SENSITIVITIES ARE REQUIRED. Performed at [facility], [address], [place], [place] [address]    Report Status 06/21/2024 FINAL  Final  Blood Culture ID Panel (Reflexed)     Status: Abnormal   Collection Time: 06/18/24 10:35 AM  Result Value Ref Range Status   Enterococcus faecalis NOT DETECTED NOT DETECTED Final   Enterococcus Faecium NOT DETECTED NOT DETECTED Final   Listeria monocytogenes NOT DETECTED NOT DETECTED Final   Staphylococcus species DETECTED (A) NOT DETECTED Final    Comment: CRITICAL RESULT CALLED TO, READ BACK BY AND VERIFIED WITH: PHARMD EMILY SINCLAIR ON 06/19/24 @ 1516 BY DRT    Staphylococcus aureus (BCID) NOT DETECTED NOT DETECTED Final   Staphylococcus epidermidis NOT DETECTED NOT DETECTED Final   Staphylococcus lugdunensis NOT DETECTED NOT DETECTED Final   Streptococcus species NOT DETECTED NOT DETECTED Final   Streptococcus agalactiae NOT DETECTED NOT DETECTED Final   Streptococcus pneumoniae NOT DETECTED NOT DETECTED Final   Streptococcus pyogenes NOT DETECTED NOT DETECTED Final   A.calcoaceticus-baumannii NOT DETECTED NOT DETECTED Final   Bacteroides fragilis NOT DETECTED NOT DETECTED Final   Enterobacterales NOT DETECTED NOT DETECTED Final   Enterobacter cloacae complex NOT DETECTED NOT DETECTED Final   Escherichia coli NOT DETECTED NOT DETECTED Final   Klebsiella aerogenes NOT DETECTED NOT DETECTED Final   Klebsiella oxytoca NOT DETECTED NOT DETECTED Final   Klebsiella pneumoniae NOT DETECTED NOT  DETECTED Final   Proteus species NOT DETECTED NOT DETECTED Final   Salmonella species NOT DETECTED NOT DETECTED Final   Serratia marcescens NOT DETECTED NOT DETECTED Final   Haemophilus influenzae NOT DETECTED NOT DETECTED Final   Neisseria meningitidis NOT DETECTED NOT DETECTED Final   Pseudomonas aeruginosa NOT DETECTED NOT DETECTED Final   Stenotrophomonas maltophilia NOT DETECTED NOT DETECTED Final   Candida albicans NOT DETECTED NOT DETECTED Final   Candida auris NOT DETECTED NOT DETECTED Final   Candida glabrata NOT DETECTED NOT DETECTED Final   Candida krusei NOT DETECTED NOT DETECTED Final   Candida parapsilosis NOT DETECTED NOT DETECTED Final   Candida tropicalis NOT DETECTED NOT DETECTED Final   Cryptococcus neoformans/gattii NOT DETECTED NOT DETECTED Final    Comment: Performed at [facility], [address], [place], [place] [address]  MRSA Next Gen by PCR, Nasal     Status: None   Collection Time: 06/18/24  8:47 PM   Specimen: Nasal Mucosa; Nasal Swab  Result Value Ref Range Status   MRSA by PCR Next Gen NOT DETECTED NOT DETECTED Final    Comment: (NOTE) The GeneXpert MRSA Assay (FDA approved for NASAL specimens only), is one component  of a comprehensive MRSA colonization surveillance program. It is not intended to diagnose MRSA infection nor to guide or monitor treatment for MRSA infections. Test performance is not FDA approved in patients less than 16 years old. Performed at [facility], [address] [place], [place] [address]     Pertinent Lab seen by me:    Latest Ref Rng & Units 06/23/2024    2:31 AM 06/20/2024    6:04 AM 06/19/2024    5:54 AM  CBC  WBC 4.0 - 10.5 K/uL 4.7  8.3  8.7   Hemoglobin 12.0 - 15.0 g/dL 89.9  89.9  89.8   Hematocrit 36.0 - 46.0 % 30.5  30.4  31.1   Platelets 150 - 400 K/uL 371  296  371       Latest Ref Rng & Units 06/20/2024    6:04 AM 06/19/2024    5:54 AM 06/18/2024    9:40 AM  CMP  Glucose 70 - 99 mg/dL 874   89  841   BUN 8 - 23 mg/dL 12  22  39   Creatinine 0.44 - 1.00 mg/dL 9.52  9.43  8.84   Sodium 135 - 145 mmol/L 140  134  135   Potassium 3.5 - 5.1 mmol/L 3.7  2.9  3.4   Chloride 98 - 111 mmol/L 106  98  98   CO2 22 - 32 mmol/L 23  25  26    Calcium  8.9 - 10.3 mg/dL 8.3  8.5  9.0   Total Protein 6.5 - 8.1 g/dL  5.9  7.4   Total Bilirubin 0.0 - 1.2 mg/dL  0.8  1.0   Alkaline Phos 38 - 126 U/L  81  104   AST 15 - 41 U/L  26  39   ALT 0 - 44 U/L  16  20      Pertinent Imagings/Other Imagings Plain films and CT images have been personally visualized and interpreted; radiology reports have been reviewed. Decision making incorporated into the Impression / Recommendations.  VAS US  ABI WITH/WO TBI Result Date: 06/19/2024  LOWER EXTREMITY DOPPLER STUDY Patient Name:  Ares Tegtmeyer  Date of Exam:   06/19/2024 Medical Rec #: [id number]           Accession #:    [id number] Date of Birth: 05/27/1932            Patient Gender: F Patient Age:   58 years Exam Location:  [facility] Procedure:      VAS US  ABI WITH/WO TBI Referring Phys: MICHAEL JEFFERY --------------------------------------------------------------------------------  Indications: Ulceration.  Limitations: Today's exam was limited due to patient positioning and patient              unable to lie flat. Performing Technologist: Jimmye Scarce RVT  Examination Guidelines: A complete evaluation includes at minimum, Doppler waveform signals and systolic blood pressure reading at the level of bilateral brachial, anterior tibial, and posterior tibial arteries, when vessel segments are accessible. Bilateral testing is considered an integral part of a complete examination. Photoelectric Plethysmograph (PPG) waveforms and toe systolic pressure readings are included as required and additional duplex testing as needed. Limited examinations for reoccurring indications may be performed as noted.  ABI Findings:  +--------+------------------+-----+----------+--------+ Right   Rt Pressure (mmHg)IndexWaveform  Comment  +--------+------------------+-----+----------+--------+ Brachial  IV       +--------+------------------+-----+----------+--------+ PTA     80                0.76 monophasic         +--------+------------------+-----+----------+--------+ DP      78                0.74 monophasic         +--------+------------------+-----+----------+--------+ +--------+------------------+-----+--------+------------------+ Left    Lt Pressure (mmHg)IndexWaveformComment            +--------+------------------+-----+--------+------------------+ Brachial105                                               +--------+------------------+-----+--------+------------------+ PTA                                    pt has contracture +--------+------------------+-----+--------+------------------+ DP      106               1.01 biphasic                   +--------+------------------+-----+--------+------------------+ +-------+-----------+-----------+------------+------------+ ABI/TBIToday's ABIToday's TBIPrevious ABIPrevious TBI +-------+-----------+-----------+------------+------------+ Right  0.76                                           +-------+-----------+-----------+------------+------------+ Left   1.01                                           +-------+-----------+-----------+------------+------------+  Summary: Right: Resting right ankle-brachial index indicates moderate right lower extremity arterial disease. Left: Resting left ankle-brachial index is within normal range. *See table(s) above for measurements and observations.  Electronically signed by Norman Serve on 06/19/2024 at 5:14:14 PM.    Final    MR FOOT RIGHT W WO CONTRAST Result Date: 06/19/2024 CLINICAL DATA:  Soft tissue infection suspected, foot, xray done EXAM: MRI OF THE  RIGHT FOREFOOT WITHOUT AND WITH CONTRAST TECHNIQUE: Multiplanar, multisequence MR imaging of the right forefoot was performed before and after the administration of intravenous contrast. CONTRAST:  4mL GADAVIST  GADOBUTROL  1 MMOL/ML IV SOLN COMPARISON:  None Available. FINDINGS: Technical note: Despite efforts by the technologist and patient, mild-to-moderate motion artifact is present on today's exam and could not be eliminated. This reduces exam sensitivity and specificity. Bones/Joint/Cartilage There is a large area of soft tissue ulceration medial to the 1st metatarsophalangeal joint, further described below. There are underlying marrow changes within the 1st metatarsal head, its sesamoids and the base of the 1st proximal phalanx, highly suspicious for osteomyelitis. Specifically, there is decreased T1 signal, T2 hyperintensity and heterogeneous enhancement following contrast. There is apparent cortical destruction involving the medial head of the 1st metatarsal T2 hyperintensity extends proximally into the mid shaft of the 1st metatarsal. There is a small effusion of the 1st metatarsophalangeal joint with associated synovial enhancement following contrast. No suspicious marrow signal abnormalities or enhancement elsewhere in the forefoot. Mild midfoot and 2nd metatarsophalangeal joint degenerative changes. Ligaments Intact Lisfranc ligament. The medial collateral ligament the 1st metatarsophalangeal joint is not well visualized. The additional collateral ligaments appear intact.  Muscles and Tendons Mild generalized muscular atrophy and edema with low level muscular enhancement medially. No discrete intramuscular fluid collections are identified. The forefoot tendons appear intact without significant tenosynovitis. Soft tissues As above, large area of soft tissue ulceration medial to the 1st metatarsophalangeal joint within underlying heterogeneous, peripherally enhancing fluid collection measuring approximately  2.2 x 2.6 x 1.7 cm, suspicious for an abscess. This abuts the 1st metatarsophalangeal joint and is associated with adjacent synovial enhancement and marrow changes consistent with osteomyelitis, as described above. Generalized subcutaneous edema throughout the forefoot without other focal fluid collection. IMPRESSION: 1. Large area of soft tissue ulceration medial to the 1st metatarsophalangeal joint with underlying abscess and underlying osteomyelitis of the 1st metatarsal head, its sesamoids and base of the 1st proximal phalanx. 2. The abscess abuts the 1st metatarsophalangeal joint and is associated with a small effusion and synovial enhancement, suspicious for septic arthritis. 3. No evidence of osteomyelitis elsewhere in the forefoot. 4. Generalized subcutaneous edema throughout the forefoot without other focal fluid collection. Electronically Signed   By: Elsie Perone M.D.   On: 06/19/2024 08:53   CT ABDOMEN PELVIS W CONTRAST Result Date: 06/18/2024 CLINICAL DATA:  Bowel obstruction EXAM: CT ABDOMEN AND PELVIS WITH CONTRAST TECHNIQUE: Multidetector CT imaging of the abdomen and pelvis was performed using the standard protocol following bolus administration of intravenous contrast. RADIATION DOSE REDUCTION: This exam was performed according to the departmental dose-optimization program which includes automated exposure control, adjustment of the mA and/or kV according to patient size and/or use of iterative reconstruction technique. CONTRAST:  75mL OMNIPAQUE  IOHEXOL  350 MG/ML SOLN COMPARISON:  None Available. FINDINGS: The patient was imaged in a contracted position laying on her left side, resulting in some shifting structures. 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 Descending thoracic aortic and right coronary artery atherosclerosis. The left lung base is excluded. Hepatobiliary: Unremarkable Pancreas: Unremarkable Spleen: Unremarkable  where included Adrenals/Urinary Tract: 2.2 cm homogeneous right kidney lower pole lesion favoring a cyst but with internal density of 44 Hounsfield units on portal venous phase images. This is technically indeterminate for cyst versus mass. In most clinical circumstances, renal protocol MRI or CT with and without contrast would be recommended for follow up of this lesion, although correlation with the patient's overall clinical scenario is recommended. Adrenal glands unremarkable. No hydronephrosis or hydroureter. Urinary bladder unremarkable. Stomach/Bowel: Prominent stool throughout the colon favors constipation. No definite dilated small bowel loops are identified, although bowel loops are relatively indistinct in portions of the abdomen. Vascular/Lymphatic: Atherosclerosis is present, including aortoiliac atherosclerotic disease. Substantial atheromatous vascular plaque at the origin of the celiac trunk and SMA contributing to potentially high-grade stenosis although both vessels appear to opacify and accordingly are likely not completely occluded. There is additional plaque further distally in the SMA. Reproductive: Calcified uterine fibroids. Other: Mesenteric edema. Difficult to exclude a small amount of ascites. Musculoskeletal: Suspected abscess in the subcutaneous tissues lateral to the right hip measuring about 12.0 by 2.0 by 12.0 cm (volume = 150 cm^3). Subcutaneous edema in the right proximal thigh posteriorly Severe arthropathy of the hips, right greater than left, with volume loss in the femoral heads. Flattening and spurring of the right acetabulum with potential erosions. Possible synovitis in the right hip joint. Degenerative arthropathy of the right elbow which is included in the field of imaging. IMPRESSION: 1. Crescentic abscess in the subcutaneous tissues lateral to the right hip measuring about 12.0 by 2.0 by  12.0 cm (volume = 150 cc). 2. Prominent stool throughout the colon favors  constipation. 3. Mesenteric edema. Difficult to exclude a small amount of ascites. 4. Severe arthropathy of the hips, right greater than left, with volume loss in the femoral heads. Flattening and spurring of the right acetabulum with potential erosions. Possible synovitis in the right hip joint. 5. 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 6. 2.2 cm homogeneous right kidney lower pole lesion favoring a cyst but with internal density of 44 Hounsfield units on portal venous phase images. This is technically indeterminate for cyst versus mass. In most clinical circumstances, renal protocol MRI or CT with and without contrast would be recommended for follow up of this lesion, although correlation with the patient's overall clinical scenario is recommended. 7. Calcified uterine fibroids. 8. Aortic Atherosclerosis (ICD10-I70.0). Notable atherosclerosis proximally in the celiac trunk and SMA with substantial stenosis although no overt occlusion. 9. Reduced diagnostic sensitivity and specificity related to patient's contracted state and left side down lateral decubitus positioning during imaging. Electronically Signed   By: Ryan Salvage M.D.   On: 06/18/2024 13:07   VAS US  LOWER EXTREMITY VENOUS (DVT) Result Date: 05/25/2024  Lower Venous DVT Study Patient Name:  Jovonda Selner  Date of Exam:   05/25/2024 Medical Rec #: [id number]           Accession #:    [id number] Date of Birth: Apr 10, 1932            Patient Gender: F Patient Age:   40 years Exam Location:  [address] Procedure:      VAS US  LOWER EXTREMITY VENOUS (DVT) Referring Phys: CLOTILDA BANKS --------------------------------------------------------------------------------  Indications: Swelling.  Limitations: Patient had legs locked and contracted. Performing Technologist: Alan Greenhouse RDMS, RVT, RDCS  Examination Guidelines: A complete evaluation includes B-mode imaging, spectral Doppler, color  Doppler, and power Doppler as needed of all accessible portions of each vessel. Bilateral testing is considered an integral part of a complete examination. Limited examinations for reoccurring indications may be performed as noted. The reflux portion of the exam is performed with the patient in reverse Trendelenburg.  +---------+---------------+---------+-----------+----------+--------------+ RIGHT    CompressibilityPhasicitySpontaneityPropertiesThrombus Aging +---------+---------------+---------+-----------+----------+--------------+ CFV      Partial        Yes      No                   Acute          +---------+---------------+---------+-----------+----------+--------------+ SFJ      Full           Yes      Yes                                 +---------+---------------+---------+-----------+----------+--------------+ FV Prox  Partial        Yes      No                                  +---------+---------------+---------+-----------+----------+--------------+ FV Mid   Partial        Yes      No                                  +---------+---------------+---------+-----------+----------+--------------+ FV DistalPartial  Yes      No                                  +---------+---------------+---------+-----------+----------+--------------+ PFV      Partial        Yes      No                                  +---------+---------------+---------+-----------+----------+--------------+ POP      Partial        Yes      No                                  +---------+---------------+---------+-----------+----------+--------------+ PTV      Full           Yes                                          +---------+---------------+---------+-----------+----------+--------------+ Gastroc  Partial        Yes      No                                  +---------+---------------+---------+-----------+----------+--------------+ Nonocclusive acute DVT seen in  the right CFV, profunda vein, FV, popliteal vein and proximal gastrocnemius veins. Right peroneal veins not well visualized.  +----+---------------+---------+-----------+----------+--------------+ LEFTCompressibilityPhasicitySpontaneityPropertiesThrombus Aging +----+---------------+---------+-----------+----------+--------------+ CFV Full           Yes      Yes                                 +----+---------------+---------+-----------+----------+--------------+     Summary: RIGHT: - Findings consistent with acute deep vein thrombosis involving the right common femoral vein, right femoral vein, right proximal profunda vein, and right popliteal vein.  - There is no evidence of superficial venous thrombosis.  LEFT: - No evidence of common femoral vein obstruction.   *See table(s) above for measurements and observations. Electronically signed by Norman Serve on 05/25/2024 at 4:07:30 PM.    Final     I spent 85 minutes involved in face-to-face and non-face-to-face activities for this patient on the day of the visit. Professional time spent includes the following activities: Preparing to see the patient (review of tests), Obtaining and reviewing separately obtained history (admission record), Performing a medically appropriate examination and evaluation , Ordering medications, referring and communicating with other health care professionals, Documenting clinical information in the EMR, Independently interpreting results (not separately reported), and Care coordination (not separately reported).  Electronically signed by:   Plan d/w requesting provider as well as ID pharm D  Of note, portions of this note may have been created with voice recognition software. While this note has been edited for accuracy, occasional wrong-word or 'sound-a-like' substitutions may have occurred due to the inherent limitations of voice recognition software.   Annalee Orem, MD Infectious Disease Physician [facility] Pager: [phone]

## 2024-06-23 NOTE — Plan of Care (Signed)

## 2024-06-23 NOTE — Plan of Care (Signed)

## 2024-06-23 NOTE — Progress Notes (Signed)
 ANTICOAGULATION CONSULT NOTE  Pharmacy Consult for Eliquis  > Lovenox  with poor PO intake Indication: Hx DVT on Eliquis  PTA   Allergies  Allergen Reactions   Other Other (See Comments)    Metals; skin gets weepy; gets worse if it makes contact (09/28/2012)   Pollen Extract Other (See Comments)    sneezing; watery eyes   Latex Itching, Swelling and Other (See Comments)    Skin swells   Morphine And Codeine Rash and Other (See Comments)    Patient does not prefer to take these, but if absolutely necessary, accommodations can be made.   Nickel Other (See Comments)    Causes weepiness (skin)   Penicillins Rash    Tolerates ceftriaxone  and cefadroxil     Patient Measurements: Height: 5' 3 (160 cm) Weight: 49.6 kg (109 lb 5.6 oz) IBW/kg (Calculated) : 52.4  Vital Signs: Temp: 97.9 F (36.6 C) (07/26 1138) Temp Source: Axillary (07/26 1138) BP: 101/88 (07/26 1138) Pulse Rate: 76 (07/26 1138)  Labs: Recent Labs    06/23/24 0231  HGB 10.0*  HCT 30.5*  PLT 371    Estimated Creatinine Clearance: 36.6 mL/min (by C-G formula based on SCr of 0.47 mg/dL).  Medical History: Past Medical History:  Diagnosis Date   Anginal pain [facility])    1990's; 09/28/2012   Arthritis    very mild (09/28/2012)   Coronary artery disease 09/27/2012   s/p PCI of the RCA with DES with remote PCI in [place] 15 to 20 years ago. 2. cath 02/2013 patent mid RCA stent, 30% left main, 70% mid to distaal tanden 70% lesions x 3, 60% mid diag, 50% ostial LCx, 40% mid LCx.    Glaucoma    Hyperlipidemia    Hypothyroidism     Medications:  See MAR  Assessment: 89 yo female with history of acute nonocclusive DVT in RLE on 6/16 on Eliquis  PTA.  Now with reduced PO intake, unable to take PO meds.  Eliquis  has been charted as given, however RN to update MAR to reflect missed dose.  Pharmacy consulted for Lovenox  dosing.  CBC stable - Hgb 10, Plt 371  Goal of Therapy:  Monitor platelets by  anticoagulation protocol: Yes   Plan:  STOP Eliquis  5 mg BID START Lovenox  1 mg/kg (50 mg) SQ q12h Monitor for s/sx of bleeding F/u ability to restart DOAC  Maurilio Fila, PharmD Clinical Pharmacist 06/23/2024  2:09 PM

## 2024-06-23 NOTE — Progress Notes (Signed)
 Daily Progress Note   Date: 06/23/2024   Patient Name: Sabrina Hart  DOB: 19-Aug-1932  MRN: [id number]  Age / Sex: 89 y.o., female  Attending Physician: Kathrin Mignon DASEN, MD Primary Care Physician: [name], MD Admit Date: 06/18/2024 Length of Stay: 5 days  Reason for Follow-up: Establishing goals of care  Past Medical History:  Diagnosis Date   Anginal pain [facility])    1990's; 09/28/2012   Arthritis    very mild (09/28/2012)   Coronary artery disease 09/27/2012   s/p PCI of the RCA with DES with remote PCI in [place] 15 to 20 years ago. 2. cath 02/2013 patent mid RCA stent, 30% left main, 70% mid to distaal tanden 70% lesions x 3, 60% mid diag, 50% ostial LCx, 40% mid LCx.    Glaucoma    Hyperlipidemia    Hypothyroidism     Subjective:   Subjective: Chart Reviewed. Updates received. Patient Assessed. Created space and opportunity for patient  and family to explore thoughts and feelings regarding current medical situation.  Today's Discussion: Today before meeting with the patient/family, I reviewed the chart including CBC without leukocytosis, white blood cell count of 4.7, stable anemia with hemoglobin 10.0.  Blood sugars remained stable with capillary glucose between 77 and 104.  I also reviewed vital signs, nursing flow sheets, medication administration record.  Over yesterday and today she is in between 10 to 20% of most meals.  She refused breakfast this morning.  They have begun doing hydrotherapy with PT and standard wound care as well.  Today I saw the patient at bedside, no family was present.  The nurse and nurse tech were at the bedside doing dressing changes.  Nursing states that she did okay with wound care but it appeared to bother her some.  She was a bit resistant at first but then agreed.  She did refuse her medications this morning.  I called spoke with the patient's daughter Imagene.  I updated her on current status of her mom.  Stated she did okay  with wound care and it bothered her some but not significantly.  She was a bit resistant at first but then acquiesced.  She did refuse her oral medications this morning, but it appears that she except her 3 PM medications.  Her daughter states that she looked better yesterday evening, was able to hold her bottle and drink from it on her own where she was not before.  She ate 25% of her dinner where she was previously refusing and her face looked fuller and healthier.  I shared that she did not eat her breakfast this morning but I agreed with the assessment about dinner.  She states that she sometimes refuses to eat and sometimes refuses her pills.  I informed her that nursing would make attempts to convince her to take her medications, which she understands.  I also updated her on the medical plan as communicated to me by the hospitalist and infectious disease, as well as previous notes from surgery and IR.  Surgery and IR do not have a role at this time.  She will likely need 6 weeks of IV antibiotics and potentially indefinite oral antibiotics for infection control, unlikely to fully eliminate the infection.  We discussed the overall plan, and her daughter agrees.  We will continue current scope and medical management.  They are agreeable to antibiotics as described above.  They will continue to take it a day at a time and see  how she does.  If she does start to significantly decline they are open to further conversations on other options for care.  I shared that I would be off the next few days but back on service on Wednesday and would recheck and then to see how the patient has done, as well as a day family.  I shared that I would update the medical team as well based on our discussions today. I provided emotional and general support through therapeutic listening, empathy, sharing of stories, and other techniques. I answered all questions and addressed all concerns to the best of my ability.  After seeing  the patient I reached out to multiple members of the medical team and nursing team to update on conversations today.  Review of Systems  Unable to perform ROS (Patient undergoing wound care and not wanting to talk much)  Objective:   Primary Diagnoses: Present on Admission:  Hypothyroidism  Primary open angle glaucoma (POAG) of both eyes  Severe late onset Alzheimer's dementia (HCC)  Hyperlipidemia  Essential hypertension  CAD (coronary artery disease)  SIRS (systemic inflammatory response syndrome) (HCC)   Vital Signs:  BP 101/88 (BP Location: Left Arm)   Pulse 76   Temp 97.9 F (36.6 C) (Axillary)   Resp 14   Ht 5' 3 (1.6 m)   Wt 49.6 kg   SpO2 99%   BMI 19.37 kg/m   Physical Exam Vitals and nursing note reviewed.  Constitutional:      General: She is sleeping. She is not in acute distress.    Comments: Appears frail  HENT:     Head: Normocephalic and atraumatic.  Cardiovascular:     Rate and Rhythm: Normal rate.  Pulmonary:     Effort: Pulmonary effort is normal. No respiratory distress.  Abdominal:     General: Abdomen is flat.  Skin:    General: Skin is warm and dry.  Neurological:     General: No focal deficit present.  Psychiatric:        Mood and Affect: Mood normal.        Behavior: Behavior normal.     Palliative Assessment/Data: 30-40%   Assessment & Plan:   HPI/Patient Profile:  89 y.o. female  with past medical history of hypertension, hyperlipidemia, hypothyroidism, CAD status post stent, dementia, glaucoma, DVT on Eliquis  presented with confusion, wound changes, constipation.  She was admitted on 06/18/2024 with SIRS, severe sepsis secondary to infected decubitus ulcers, right hip abscess, osteomyelitis of the first metatarsal head of the right foot, acute septic encephalopathy, constipation, AKI, and others.    Palliative medicine was consulted for GOC conversations.  SUMMARY OF RECOMMENDATIONS   DNR-limited Continue current scope of  care No feeding tubes Time for outcomes Agreeable to prolonged IV antibiotics (likely minimum 6 weeks) and potential indefinite oral antibiotics for infection control Open to further conversations the patient has significant decline Palliative medicine will follow-up on 06/27/2024 for ongoing discussions Please notify us  of any significant clinical change or new/urgent palliative needs before that  Symptom Management:  Per primary team PMD is available to assist as needed  Code Status: DNR-limited  Prognosis: Unable to determine  Discharge Planning: To Be Determined  Discussed with: Patient, family, medical team, nursing team  Thank you for allowing us  to participate in the care of Sabrina Hart PMT will continue to support holistically.  Billing based on MDM: High  Problems Addressed: One acute or chronic illness or injury that poses a threat to  life or bodily function  Amount and/or Complexity of Data: Category 1:Review of prior external note(s) from each unique source, Review of the result(s) of each unique test, and Assessment requiring an independent historian(s) and Category 3:Discussion of management or test interpretation with external physician/other qualified health care professional/appropriate source (not separately reported)  Risks: N/A  Detailed review of medical records (labs, imaging, vital signs), medically appropriate exam, discussed with treatment team, counseling and education to patient, family, & staff, documenting clinical information, medication management, coordination of care  Camellia Kays, NP Palliative Medicine Team  Team Phone # [phone] (Nights/Weekends)  07/28/2021, 8:17 AM

## 2024-06-23 NOTE — Progress Notes (Addendum)
 Pharmacy Antibiotic Note  Sabrina Hart is a 89 y.o. female admitted on 06/18/2024 with abscess, osteomyelitis.  Pharmacy has been consulted for Rocephin , Daptomycin  dosing.  D#6 total antibiotics - s/p 2d vanc/aztreonam , transitioned to cefadroxil /doxy for 3d (7/23 -7/25). Abscess + osteo of 1st metatarsal per 7/22 foot MRI. Now with reduced PO intake, transitioning back to IV antibiotics per ID.  Plan: START Daptomycin  IV 400 mg q24h  START Rocephin  IV 2g q24h F/u DOT, ID following  Height: 5' 3 (160 cm) Weight: 49.6 kg (109 lb 5.6 oz) IBW/kg (Calculated) : 52.4  Temp (24hrs), Avg:98.2 F (36.8 C), Min:97.7 F (36.5 C), Max:99.1 F (37.3 C)  Recent Labs  Lab 06/18/24 0940 06/18/24 0950 06/18/24 1141 06/19/24 0554 06/20/24 0604 06/20/24 0906 06/23/24 0231  WBC 12.3*  --   --  8.7 8.3  --  4.7  CREATININE 1.15*  --   --  0.56 0.47  --   --   LATICACIDVEN  --  1.9 3.7*  --   --  1.6  --     Estimated Creatinine Clearance: 36.6 mL/min (by C-G formula based on SCr of 0.47 mg/dL).    Allergies  Allergen Reactions   Other Other (See Comments)    Metals; skin gets weepy; gets worse if it makes contact (09/28/2012)   Pollen Extract Other (See Comments)    sneezing; watery eyes   Latex Itching, Swelling and Other (See Comments)    Skin swells   Morphine And Codeine Rash and Other (See Comments)    Patient does not prefer to take these, but if absolutely necessary, accommodations can be made.   Nickel Other (See Comments)    Causes weepiness (skin)   Penicillins Rash    Tolerates ceftriaxone  and cefadroxil     Antimicrobials this admission: Vanc 7/21>> 7/23 Aztreonam  7/21 >> 7/22 CRO 7/22 x1, 7/26 >> c Cefadroxil  7/23 > 7/26 Doxycycline  7/25 >> 7/26 Dapto 7/26 >> c  Dose adjustments this admission: 7/22 Vancomycin  1g q48hr > 1250g q48hr   Microbiology results: 7/21 Bcx 1/4 staph capitis 7/21 Ucx negative 7/21 MRSA neg   Thank you for allowing  pharmacy to be a part of this patient's care.  Maurilio PENNER [name] 06/23/2024 2:34 PM

## 2024-06-23 NOTE — Progress Notes (Signed)
 PROGRESS NOTE  Sabrina Hart FMW:[id number] DOB: 07/31/1932   PCP: Sabrina Clotilda [name], MD  Patient is from: Home.  DOA: 06/18/2024 LOS: 5  Chief complaints Chief Complaint  Patient presents with   Wound Check     Brief Narrative / Interim history: 89 year old F with PMH of dementia, CAD/stent, HTN, HLD, hypothyroidism, glaucoma, DVT on Eliquis  and chronic bilateral gluteal decubitus and right foot ulcer presenting with increased CPH/bloody drainage after starting Eliquis  for DVT, decline in mentation and activity 4 weeks, and admitted with severe sepsis due to infected decubitus ulcer, right hip abscess and osteomyelitis of first metatarsal head of the right foot.    CT abdomen pelvis showed crescentic abscess at the right hip measuring 12 x 2 x 12 cm, prominent stool favoring constipation, mesenteric edema, severe arthropathy of the hips possible synovitis in the right hip.   Patient received vancomycin , aztreonam  in the ED in the setting of penicillin allergy. Was also diagnosed with right foot abscess and osteomyelitis.  Orthopedic surgery and general surgery consulted, and both recommended wound care and palliative care consult.  After meeting with palliative care, patient remains on p.o. antibiotics with hydrotherapy and wound care for decubitus ulcer.   Subjective: Seen and examined earlier this morning.  No major events overnight or this morning.  Patient is awake but not a great historian.  She is on awake and oriented to self.  Further history limited by patient's cognitive status.  Objective: Vitals:   06/22/24 1947 06/23/24 0007 06/23/24 0416 06/23/24 0700  BP: (!) 124/52 (!) 117/59 (!) 113/56   Pulse: 77 75 72 74  Resp: 16 18 14 13   Temp: 99.1 F (37.3 C) 98 F (36.7 C) 97.7 F (36.5 C) 98.2 F (36.8 C)  TempSrc: Oral Oral Oral Oral  SpO2: 94% 95% 94% 100%  Weight:      Height:        Examination:  GENERAL: No apparent distress.  Appears frail. HEENT: MMM.   Vision and hearing grossly intact.  NECK: Supple.  No apparent JVD.  RESP:  No IWOB.  Fair aeration bilaterally. CVS:  RRR. Heart sounds normal.  ABD/GI/GU: BS+. Abd soft, NTND.  MSK/EXT: Seems to have some contractures.  Prevalon boots NEURO: AA.  Oriented to self.  No apparent focal neuro deficit. PSYCH: Calm. Normal affect.   Consultants:  Orthopedic surgery General surgery Palliative medicine Infectious disease  Procedures: None  Microbiology summarized: MRSA PCR screen nonreactive Blood culture with Staph capitis in 1 out of 2 bottles  Assessment and plan: Severe sepsis due to infected decubitus ulcers, right hip abscess and right foot abscess and osteomyelitis: Present on admission.  Had tachypnea and leukocytosis and lactic acid of 3.7. -CT abdomen and pelvis as above. -MRI of right foot ulceration medial to the first MTP joint with underlying abscess and osteomyelitis of the first metatarsal head, its sesmoids and the base of first proximal phalanx -Evaluated by Dr. Stan to be not a candidate for endovascular evaluation due to her flexor contracture -Ortho and general surgery do not recommend debridement  -General Surgery recommended hydrotherapy, WTD, air mattress and frequent turning and offloading. -Patient has been on Duricef and doxycycline  until 06/24/2024. -Family had a meeting with palliative care yesterday, consensus is to continue current scope of care -Consult ID for input on antibiotics.   Acute septic encephalopathy, POA: Improved, she is at baseline. Dementia without behavioral disturbance - Continue home donepezil , risperidone , Ativan , sertraline  once confirmed  Positive blood culture:  Blood culture with Staphylococcus capitis in 1 out of 2 bottles likely contaminant   Right kidney lesion: Favoring a cyst but is indeterminant -MRI recommended for follow-up but doubt utility given patient's age and poor prognosis   Hypoglycemia: 47 initially,  required dextrose  fluid.  Now on diet, sugar controlled.   Constipation: MiraLAX  and Fleet enema and Colace ordered.  Constipation resolved.   AKI: Likely ATN due to severe sepsis.  Resolved with IV fluids   Hypokalemia: Resolved.   Mild hyponatremia: Monitor.   Hypertension normotensive off home antihypertensive meds.   Hyperlipidemia - Atorvastatin  held outpatient   CAD: Status post stents - ContinueEliquis   Hypothyroidism - Continue home Synthroid    Glaucoma - Continue eyedrops once confirmed   History of DVT - Continue home Eliquis .     Body mass index is 19.37 kg/m.           DVT prophylaxis:  Place and maintain sequential compression device Start: 06/18/24 1339 apixaban  (ELIQUIS ) tablet 5 mg  Code Status: DNR Family Communication: None at bedside Level of care: Progressive Status is: Inpatient Remains inpatient appropriate because: Severe sepsis, right hip abscess, right foot osteomyelitis   Final disposition:    55 minutes with more than 50% spent in reviewing records, counseling patient/family and coordinating care.   Sch Meds:  Scheduled Meds:  acetaminophen   1,000 mg Oral Q6H   apixaban   5 mg Oral BID   cefadroxil   500 mg Oral BID   collagenase    Topical Daily   docusate sodium   100 mg Oral BID   donepezil   5 mg Oral QHS   doxycycline   100 mg Oral Q12H   levothyroxine   88 mcg Oral Daily   polyethylene glycol  17 g Oral BID   risperiDONE   0.5 mg Oral BID   sertraline   25 mg Oral Daily   sodium chloride  flush  3 mL Intravenous Q12H   Continuous Infusions: PRN Meds:.sodium chloride  flush, sorbitol   Antimicrobials: Anti-infectives (From admission, onward)    Start     Dose/Rate Route Frequency Ordered Stop   06/22/24 1000  doxycycline  (VIBRA -TABS) tablet 100 mg        100 mg Oral Every 12 hours 06/20/24 0937 06/24/24 1247   06/20/24 1200  vancomycin  (VANCOCIN ) IVPB 1000 mg/200 mL premix  Status:  Discontinued        1,000 mg 200  mL/hr over 60 Minutes Intravenous Every 48 hours 06/18/24 1432 06/19/24 1540   06/20/24 1030  cefadroxil  (DURICEF) capsule 500 mg        500 mg Oral 2 times daily 06/20/24 0937 06/24/24 1248   06/20/24 0600  vancomycin  (VANCOREADY) IVPB 1250 mg/250 mL  Status:  Discontinued        1,250 mg 166.7 mL/hr over 90 Minutes Intravenous Every 48 hours 06/19/24 1540 06/20/24 0944   06/19/24 1600  cefTRIAXone  (ROCEPHIN ) 2 g in sodium chloride  0.9 % 100 mL IVPB  Status:  Discontinued        2 g 200 mL/hr over 30 Minutes Intravenous Every 24 hours 06/19/24 1417 06/20/24 0944   06/18/24 2000  aztreonam  (AZACTAM ) 1 g in sodium chloride  0.9 % 100 mL IVPB  Status:  Discontinued        1 g 200 mL/hr over 30 Minutes Intravenous Every 8 hours 06/18/24 1432 06/19/24 1417   06/18/24 1015  aztreonam  (AZACTAM ) 2 g in sodium chloride  0.9 % 100 mL IVPB        2 g 200 mL/hr over  30 Minutes Intravenous  Once 06/18/24 1008 06/18/24 1126   06/18/24 1015  vancomycin  (VANCOCIN ) IVPB 1000 mg/200 mL premix  Status:  Discontinued        1,000 mg 200 mL/hr over 60 Minutes Intravenous  Once 06/18/24 1008 06/18/24 1014   06/18/24 1015  vancomycin  (VANCOREADY) IVPB 1250 mg/250 mL        1,250 mg 166.7 mL/hr over 90 Minutes Intravenous  Once 06/18/24 1014 06/18/24 1321        I have personally reviewed the following labs and images: CBC: Recent Labs  Lab 06/18/24 0940 06/19/24 0554 06/20/24 0604 06/23/24 0231  WBC 12.3* 8.7 8.3 4.7  NEUTROABS 10.6*  --  6.9 2.8  HGB 12.5 10.1* 10.0* 10.0*  HCT 38.3 31.1* 30.4* 30.5*  MCV 86.7 86.4 86.4 85.4  PLT 440* 371 296 371   BMP &GFR Recent Labs  Lab 06/18/24 0940 06/19/24 0554 06/20/24 0604  NA 135 134* 140  K 3.4* 2.9* 3.7  CL 98 98 106  CO2 26 25 23   GLUCOSE 158* 89 125*  BUN 39* 22 12  CREATININE 1.15* 0.56 0.47  CALCIUM  9.0 8.5* 8.3*  MG 2.3  --   --    Estimated Creatinine Clearance: 36.6 mL/min (by C-G formula based on SCr of 0.47 mg/dL). Liver &  Pancreas: Recent Labs  Lab 06/18/24 0940 06/19/24 0554  AST 39 26  ALT 20 16  ALKPHOS 104 81  BILITOT 1.0 0.8  PROT 7.4 5.9*  ALBUMIN 2.3* 1.9*   No results for input(s): LIPASE, AMYLASE in the last 168 hours. No results for input(s): AMMONIA in the last 168 hours. Diabetic: No results for input(s): HGBA1C in the last 72 hours. Recent Labs  Lab 06/22/24 1750 06/22/24 1946 06/23/24 0010 06/23/24 0414 06/23/24 0830  GLUCAP 97 96 102* 77 104*   Cardiac Enzymes: No results for input(s): CKTOTAL, CKMB, CKMBINDEX, TROPONINI in the last 168 hours. No results for input(s): PROBNP in the last 8760 hours. Coagulation Profile: No results for input(s): INR, PROTIME in the last 168 hours. Thyroid  Function Tests: No results for input(s): TSH, T4TOTAL, FREET4, T3FREE, THYROIDAB in the last 72 hours. Lipid Profile: No results for input(s): CHOL, HDL, LDLCALC, TRIG, CHOLHDL, LDLDIRECT in the last 72 hours. Anemia Panel: No results for input(s): VITAMINB12, FOLATE, FERRITIN, TIBC, IRON, RETICCTPCT in the last 72 hours. Urine analysis:    Component Value Date/Time   COLORURINE YELLOW 06/18/2024 0121   APPEARANCEUR HAZY (A) 06/18/2024 0121   LABSPEC >1.046 (H) 06/18/2024 0121   PHURINE 5.0 06/18/2024 0121   GLUCOSEU NEGATIVE 06/18/2024 0121   HGBUR SMALL (A) 06/18/2024 0121   BILIRUBINUR NEGATIVE 06/18/2024 0121   BILIRUBINUR neg 04/06/2023 1443   KETONESUR NEGATIVE 06/18/2024 0121   PROTEINUR NEGATIVE 06/18/2024 0121   UROBILINOGEN negative (A) 04/06/2023 1443   NITRITE NEGATIVE 06/18/2024 0121   LEUKOCYTESUR SMALL (A) 06/18/2024 0121   Sepsis Labs: Invalid input(s): PROCALCITONIN, LACTICIDVEN  Microbiology: Recent Results (from the past 240 hours)  Urine Culture     Status: None   Collection Time: 06/18/24  1:21 AM   Specimen: Urine, Random  Result Value Ref Range Status   Specimen Description URINE, RANDOM  Final    Special Requests URINE, CLEAN CATCH  Final   Culture   Final    NO GROWTH Performed at [facility], [address] [place], [place] [address]    Report Status 06/20/2024 FINAL  Final  Culture, blood (routine x 2)  Status: None (Preliminary result)   Collection Time: 06/18/24 10:30 AM   Specimen: BLOOD RIGHT ARM  Result Value Ref Range Status   Specimen Description BLOOD RIGHT ARM  Final   Special Requests   Final    BOTTLES DRAWN AEROBIC AND ANAEROBIC Blood Culture adequate volume   Culture   Final    NO GROWTH 4 DAYS Performed at [facility], [address] [place], [place] [address]    Report Status PENDING  Incomplete  Culture, blood (routine x 2)     Status: Abnormal   Collection Time: 06/18/24 10:35 AM   Specimen: BLOOD LEFT ARM  Result Value Ref Range Status   Specimen Description BLOOD LEFT ARM  Final   Special Requests   Final    BOTTLES DRAWN AEROBIC AND ANAEROBIC Blood Culture adequate volume   Culture  Setup Time   Final    GRAM POSITIVE COCCI IN CLUSTERS ANAEROBIC BOTTLE ONLY CRITICAL RESULT CALLED TO, READ BACK BY AND VERIFIED WITH: PHARMD EMILY SINCLAIR ON 06/19/24 @ 1516 BY DRT    Culture (A)  Final    STAPHYLOCOCCUS CAPITIS THE SIGNIFICANCE OF ISOLATING THIS ORGANISM FROM A SINGLE SET OF BLOOD CULTURES WHEN MULTIPLE SETS ARE DRAWN IS UNCERTAIN. PLEASE NOTIFY THE MICROBIOLOGY DEPARTMENT WITHIN ONE WEEK IF SPECIATION AND SENSITIVITIES ARE REQUIRED. Performed at [facility], [address], [place], [place] [address]    Report Status 06/21/2024 FINAL  Final  Blood Culture ID Panel (Reflexed)     Status: Abnormal   Collection Time: 06/18/24 10:35 AM  Result Value Ref Range Status   Enterococcus faecalis NOT DETECTED NOT DETECTED Final   Enterococcus Faecium NOT DETECTED NOT DETECTED Final   Listeria monocytogenes NOT DETECTED NOT DETECTED Final   Staphylococcus species DETECTED (A) NOT DETECTED Final    Comment: CRITICAL RESULT CALLED  TO, READ BACK BY AND VERIFIED WITH: PHARMD EMILY SINCLAIR ON 06/19/24 @ 1516 BY DRT    Staphylococcus aureus (BCID) NOT DETECTED NOT DETECTED Final   Staphylococcus epidermidis NOT DETECTED NOT DETECTED Final   Staphylococcus lugdunensis NOT DETECTED NOT DETECTED Final   Streptococcus species NOT DETECTED NOT DETECTED Final   Streptococcus agalactiae NOT DETECTED NOT DETECTED Final   Streptococcus pneumoniae NOT DETECTED NOT DETECTED Final   Streptococcus pyogenes NOT DETECTED NOT DETECTED Final   A.calcoaceticus-baumannii NOT DETECTED NOT DETECTED Final   Bacteroides fragilis NOT DETECTED NOT DETECTED Final   Enterobacterales NOT DETECTED NOT DETECTED Final   Enterobacter cloacae complex NOT DETECTED NOT DETECTED Final   Escherichia coli NOT DETECTED NOT DETECTED Final   Klebsiella aerogenes NOT DETECTED NOT DETECTED Final   Klebsiella oxytoca NOT DETECTED NOT DETECTED Final   Klebsiella pneumoniae NOT DETECTED NOT DETECTED Final   Proteus species NOT DETECTED NOT DETECTED Final   Salmonella species NOT DETECTED NOT DETECTED Final   Serratia marcescens NOT DETECTED NOT DETECTED Final   Haemophilus influenzae NOT DETECTED NOT DETECTED Final   Neisseria meningitidis NOT DETECTED NOT DETECTED Final   Pseudomonas aeruginosa NOT DETECTED NOT DETECTED Final   Stenotrophomonas maltophilia NOT DETECTED NOT DETECTED Final   Candida albicans NOT DETECTED NOT DETECTED Final   Candida auris NOT DETECTED NOT DETECTED Final   Candida glabrata NOT DETECTED NOT DETECTED Final   Candida krusei NOT DETECTED NOT DETECTED Final   Candida parapsilosis NOT DETECTED NOT DETECTED Final   Candida tropicalis NOT DETECTED NOT DETECTED Final   Cryptococcus neoformans/gattii NOT DETECTED NOT DETECTED Final  Comment: Performed at [facility], [address], [place], [place] [address]  MRSA Next Gen by PCR, Nasal     Status: None   Collection Time: 06/18/24  8:47 PM   Specimen: Nasal Mucosa; Nasal Swab   Result Value Ref Range Status   MRSA by PCR Next Gen NOT DETECTED NOT DETECTED Final    Comment: (NOTE) The GeneXpert MRSA Assay (FDA approved for NASAL specimens only), is one component of a comprehensive MRSA colonization surveillance program. It is not intended to diagnose MRSA infection nor to guide or monitor treatment for MRSA infections. Test performance is not FDA approved in patients less than 52 years old. Performed at [facility], [address] [place], [place] [address]     Radiology Studies: No results found.    Gwen Sarvis T. Alistar Mcenery [facility]  If 7PM-7AM, please contact night-coverage [email or web address] 06/23/2024, 11:35 AM

## 2024-06-24 DIAGNOSIS — A419 Sepsis, unspecified organism: Secondary | ICD-10-CM | POA: Diagnosis not present

## 2024-06-24 DIAGNOSIS — Z515 Encounter for palliative care: Secondary | ICD-10-CM | POA: Diagnosis not present

## 2024-06-24 DIAGNOSIS — Z7189 Other specified counseling: Secondary | ICD-10-CM | POA: Diagnosis not present

## 2024-06-24 DIAGNOSIS — R6510 Systemic inflammatory response syndrome (SIRS) of non-infectious origin without acute organ dysfunction: Secondary | ICD-10-CM | POA: Diagnosis not present

## 2024-06-24 LAB — GLUCOSE, CAPILLARY
Glucose-Capillary: 121 mg/dL — ABNORMAL HIGH (ref 70–99)
Glucose-Capillary: 184 mg/dL — ABNORMAL HIGH (ref 70–99)
Glucose-Capillary: 83 mg/dL (ref 70–99)
Glucose-Capillary: 84 mg/dL (ref 70–99)
Glucose-Capillary: 87 mg/dL (ref 70–99)
Glucose-Capillary: 87 mg/dL (ref 70–99)
Glucose-Capillary: 90 mg/dL (ref 70–99)

## 2024-06-24 LAB — BASIC METABOLIC PANEL WITH GFR
Anion gap: 10 (ref 5–15)
BUN: 15 mg/dL (ref 8–23)
CO2: 24 mmol/L (ref 22–32)
Calcium: 7.9 mg/dL — ABNORMAL LOW (ref 8.9–10.3)
Chloride: 105 mmol/L (ref 98–111)
Creatinine, Ser: 0.53 mg/dL (ref 0.44–1.00)
GFR, Estimated: >60 mL/min (ref >60)
Glucose, Bld: 86 mg/dL (ref 70–99)
Potassium: 2.9 mmol/L — ABNORMAL LOW (ref 3.5–5.1)
Sodium: 139 mmol/L (ref 135–145)

## 2024-06-24 LAB — CBC
HCT: 29.3 % — ABNORMAL LOW (ref 36.0–46.0)
Hemoglobin: 9.7 g/dL — ABNORMAL LOW (ref 12.0–15.0)
MCH: 28.3 pg (ref 26.0–34.0)
MCHC: 33.1 g/dL (ref 30.0–36.0)
MCV: 85.4 fL (ref 80.0–100.0)
Platelets: 425 K/uL — ABNORMAL HIGH (ref 150–400)
RBC: 3.43 MIL/uL — ABNORMAL LOW (ref 3.87–5.11)
RDW: 14 % (ref 11.5–15.5)
WBC: 3.9 K/uL — ABNORMAL LOW (ref 4.0–10.5)
nRBC: 0 % (ref 0.0–0.2)

## 2024-06-24 LAB — MAGNESIUM: Magnesium: 1.9 mg/dL (ref 1.7–2.4)

## 2024-06-24 MED ORDER — PROSOURCE PLUS PO LIQD
30.0000 mL | Freq: Three times a day (TID) | ORAL | Status: DC
  Administered 2024-06-25 – 2024-06-26 (×3): 30 mL via ORAL
  Filled 2024-06-24 (×3): qty 30

## 2024-06-24 MED ORDER — ADULT MULTIVITAMIN W/MINERALS CH
1.0000 | ORAL_TABLET | Freq: Every day | ORAL | Status: DC
  Administered 2024-06-24 – 2024-06-26 (×3): 1 via ORAL
  Filled 2024-06-24 (×3): qty 1

## 2024-06-24 MED ORDER — ENSURE PLUS HIGH PROTEIN PO LIQD
237.0000 mL | Freq: Three times a day (TID) | ORAL | Status: DC
  Administered 2024-06-25 – 2024-07-01 (×12): 237 mL via ORAL

## 2024-06-24 MED ORDER — POTASSIUM CHLORIDE 20 MEQ PO PACK
40.0000 meq | PACK | ORAL | Status: AC
  Administered 2024-06-24: 40 meq via ORAL
  Filled 2024-06-24: qty 2

## 2024-06-24 MED ORDER — ZINC SULFATE 220 (50 ZN) MG PO CAPS
220.0000 mg | ORAL_CAPSULE | Freq: Every day | ORAL | Status: DC
  Administered 2024-06-24 – 2024-06-26 (×3): 220 mg via ORAL
  Filled 2024-06-24 (×3): qty 1

## 2024-06-24 MED ORDER — VITAMIN C 500 MG PO TABS
500.0000 mg | ORAL_TABLET | Freq: Two times a day (BID) | ORAL | Status: DC
  Administered 2024-06-24 – 2024-06-27 (×6): 500 mg via ORAL
  Filled 2024-06-24 (×6): qty 1

## 2024-06-24 NOTE — Evaluation (Addendum)
 Physical Therapy Evaluation & Discharge Patient Details Name: Sabrina Hart MRN: [id number] DOB: 11/10/1932 Today's Date: 06/24/2024  History of Present Illness  89 y.o. female admitted 06/18/24 with worsening confusion, constipation, drainage from chronic wounds. Workup for encephalopathy, sepsis, chronic bilateral gluteal and metatarsal wounds, osteomyelitis R first metatarsal head. Hydrotherapy initiated 06/21/24. PMH includes dementia, CAD, HTN, HLD, glaucoma, DVT.   Clinical Impression  Patient evaluated by Physical Therapy with no further acute PT needs identified. Pt poor historian, confused and not answering questions appropriately; per chart, pt from home with family, wheelchair-bound. Today, pt requiring totalA for bed-level mobility, repositioning and self-care due to bladder/bowel incontinence. Pt not following directions, actively resisting movement and grimacing with attempts at BUE/BLE repositioning. Patient would benefit from LTC placement if family unable to continue providing necessary assist home; if to return home, will require hospital bed, hoyer lift and significant physical assist. Acute PT is signing off. Thank you for this referral.    patient remains on Hydrotherapy PT's caseload    If plan is discharge home, recommend the following: Two people to help with walking and/or transfers;Two people to help with bathing/dressing/bathroom;Assistance with feeding   Can travel by private vehicle   No    Equipment Recommendations Hospital bed;Hoyer lift  Recommendations for Other Services       Functional Status Assessment Patient has had a recent decline in their functional status and demonstrates the ability to make significant improvements in function in a reasonable and predictable amount of time.     Precautions / Restrictions Precautions Precautions: Fall;Other (comment) Recall of Precautions/Restrictions: Impaired Precaution/Restrictions Comments: bilateral  gluteal, R hip and R foot wounds; bladder/bowel incontinence      Mobility  Bed Mobility Overal bed mobility: Needs Assistance Bed Mobility: Rolling Rolling: Total assist, +2 for physical assistance, +2 for safety/equipment, Used rails         General bed mobility comments: totalA+2 for rolling R/L (only tolerating partial roll onto R-side) and scooting up in bed; pt actively resisting majority of mobility, though able to maintain grips on bed rail when hands assisted there    Transfers                   General transfer comment: unable    Ambulation/Gait                  Stairs            Wheelchair Mobility     Tilt Bed    Modified Rankin (Stroke Patients Only)       Balance Overall balance assessment: Needs assistance                                           Pertinent Vitals/Pain Pain Assessment Pain Assessment: Faces Faces Pain Scale: Hurts even more Pain Location: grimacing with attempts at BLE extension, unable to specify pain location/details Pain Descriptors / Indicators: Grimacing Pain Intervention(s): Monitored during session    Home Living Family/patient expects to be discharged to:: Private residence Living Arrangements: Children                      Prior Function Prior Level of Function : Patient poor historian/Family not available             Mobility Comments: per chart, pt from home with family, wheelchair-bound (has manual w/c  in hospital room)       Extremity/Trunk Assessment   Upper Extremity Assessment Upper Extremity Assessment: RUE deficits/detail;LUE deficits/detail;Difficult to assess due to impaired cognition RUE Deficits / Details: demonstrates good hand mobilty and strong grip strength; actively resisting attempts at elbow ext and shoulder flex LUE Deficits / Details: demonstrates good hand mobilty and strong grip strength; actively resisting attempts at elbow ext and  shoulder flex    Lower Extremity Assessment Lower Extremity Assessment: RLE deficits/detail;LLE deficits/detail;Difficult to assess due to impaired cognition RLE Deficits / Details: BLEs resting with significant hip/knee flexion, pt actively resisting attempts to extend, difficult to determine resistance vs contracture vs tone. R hip and foot wound wrapped LLE Deficits / Details: BLEs resting with significant hip/knee flexion, pt actively resisting attempts to extend, difficult to determine resistance vs contracture vs tone    Cervical / Trunk Assessment Cervical / Trunk Assessment: Kyphotic  Communication   Communication Communication: Impaired Factors Affecting Communication: Reduced clarity of speech    Cognition Arousal: Lethargic     PT - Cognitive impairments: History of cognitive impairments                       PT - Cognition Comments: h/o dementia. pt awake but keeping eyes closed during session. pt mumbling soft speech throughout session, most words were able to be understood. pt confused, disoriented, not following commands, actively resisting movements Following commands: Impaired Following commands impaired: Follows one step commands inconsistently     Cueing       General Comments General comments (skin integrity, edema, etc.): pt with bladder/bowel incontinence, totalA for toileting and washup with +2-3 assist; RN present to replace sacral pad. prevalon boots redonned and pillows placed for pressure relief to bony prominences    Exercises     Assessment/Plan    PT Assessment Patient does not need any further PT services  PT Problem List         PT Treatment Interventions      PT Goals (Current goals can be found in the Care Plan section)  Acute Rehab PT Goals PT Goal Formulation: Patient unable to participate in goal setting    Frequency       Co-evaluation PT/OT/SLP Co-Evaluation/Treatment: Yes Reason for Co-Treatment: Necessary to  address cognition/behavior during functional activity;For patient/therapist safety;To address functional/ADL transfers PT goals addressed during session: Mobility/safety with mobility         AM-PAC PT 6 Clicks Mobility  Outcome Measure Help needed turning from your back to your side while in a flat bed without using bedrails?: Total Help needed moving from lying on your back to sitting on the side of a flat bed without using bedrails?: Total Help needed moving to and from a bed to a chair (including a wheelchair)?: Total Help needed standing up from a chair using your arms (e.g., wheelchair or bedside chair)?: Total Help needed to walk in hospital room?: Total Help needed climbing 3-5 steps with a railing? : Total 6 Click Score: 6    End of Session   Activity Tolerance: Other (comment) (treatment limited by impaired cognition) Patient left: in bed;with call bell/phone within reach;with nursing/sitter in room Nurse Communication: Mobility status;Need for lift equipment PT Visit Diagnosis: Other abnormalities of gait and mobility (R26.89)    Time: 1030-1053 PT Time Calculation (min) (ACUTE ONLY): 23 min   Charges:   PT Evaluation $PT Eval Moderate Complexity: 1 Mod   PT General Charges $$ ACUTE PT VISIT:  1 Visit       Leander Tout, PT, DPT Acute Rehabilitation Services  Personal: Secure Chat Rehab Office: [phone]  Darice LITTIE Almas 06/24/2024, 12:06 PM

## 2024-06-24 NOTE — Progress Notes (Signed)
 PROGRESS NOTE  Sabrina Hart FMW:[id number] DOB: 1932-05-17   PCP: [name], MD  Patient is from: Home.  DOA: 06/18/2024 LOS: 6  Chief complaints Chief Complaint  Patient presents with   Wound Check     Brief Narrative / Interim history: 89 year old F with PMH of dementia, CAD/stent, HTN, HLD, hypothyroidism, glaucoma, DVT on Eliquis  and chronic bilateral gluteal decubitus and right foot ulcer presenting with increased CPH/bloody drainage after starting Eliquis  for DVT, decline in mentation and activity 4 weeks, and admitted with severe sepsis due to infected decubitus ulcer, right hip abscess and osteomyelitis of first metatarsal head of the right foot.    CT abdomen pelvis showed crescentic abscess at the right hip measuring 12 x 2 x 12 cm, prominent stool favoring constipation, mesenteric edema, severe arthropathy of the hips possible synovitis in the right hip.   Patient received vancomycin , aztreonam  in the ED in the setting of penicillin allergy. Was also diagnosed with right foot abscess and osteomyelitis.  Orthopedic and general surgery consulted, and did not feel there is indication for I&D and recommended wound care and palliative care consult.  After meeting with palliative care, family decided to continue current scope of care with antibiotics.  ID consulted on 7/26, and escalated antibiotics to daptomycin , ceftriaxone  and Flagyl .  Subjective: Seen and examined earlier this morning.  No major events overnight or this morning.  Patient is awake but not a great historian.  She is on awake and oriented to self.  Responds no to pain.  Further history limited by patient's cognitive status.  Objective: Vitals:   06/23/24 2000 06/24/24 0000 06/24/24 0428 06/24/24 0819  BP: 116/76 (!) 118/49 115/71 122/61  Pulse: 73 64 68 76  Resp: 16 16 14 16   Temp: 97.7 F (36.5 C) 98.9 F (37.2 C) 97.8 F (36.6 C) 97.8 F (36.6 C)  TempSrc: Oral Oral Oral Oral  SpO2: 97% 97%  97% 100%  Weight:      Height:        Examination:  GENERAL: No apparent distress.  Appears frail. HEENT: MMM.  Vision and hearing grossly intact.  NECK: Supple.  No apparent JVD.  RESP:  No IWOB.  Fair aeration bilaterally. CVS:  RRR. Heart sounds normal.  ABD/GI/GU: BS+. Abd soft, NTND.  MSK/EXT: Seems to have some contractures.  Prevalon boots NEURO: AA.  Oriented to self.  No apparent focal neuro deficit. PSYCH: Calm. Normal affect.   Consultants:  Orthopedic surgery General surgery Palliative medicine Infectious disease Interventional radiology  Procedures: None  Microbiology summarized: MRSA PCR screen nonreactive Blood culture with Staph capitis in 1 out of 2 bottles  Assessment and plan: Severe sepsis due to infected decubitus ulcers, right hip abscess and right foot abscess and osteomyelitis: Present on admission.  Had tachypnea and leukocytosis and lactic acid of 3.7. -CT abdomen and pelvis as above. -MRI of right foot ulceration medial to the first MTP joint with underlying abscess and osteomyelitis of the first metatarsal head, its sesmoids and the base of first proximal phalanx -Evaluated by Dr. Stan to be not a candidate for endovascular evaluation due to her flexor contracture -Ortho and general surgery did not feel I&D is indicated.  IR concurred with surgery and Ortho. -General Surgery recommended hydrotherapy, WTD, air mattress and frequent turning and offloading. -Family expressed desire to continue current scope of care with antibiotics.  No tube feed. -Patient has been on Duricef and doxy.  ID consulted on 7/26 and escalated to  daptomycin , CTX and Flagyl    Acute septic encephalopathy, POA: Improved, she is at baseline. Dementia without behavioral disturbance -Continue home donepezil , risperidone , Ativan , sertraline  once confirmed  Positive blood culture: Blood culture with Staphylococcus capitis in 1 out of 2 bottles likely contaminant    Right kidney lesion: Favoring a cyst but is indeterminant -MRI recommended for follow-up but doubt utility given patient's age and poor prognosis   Hypoglycemia: 47 initially, required dextrose  fluid.  Now on diet, sugar controlled.   Constipation: MiraLAX  and Fleet enema and Colace ordered.  Constipation resolved.   Hypertension normotensive off home antihypertensive meds.  AKI: Likely ATN due to severe sepsis.  Resolved.   Hypokalemia -Monitor replenish K and Mg as appropriate   Mild hyponatremia: Resolved.  Normocytic anemia: Stable.   Hyperlipidemia - Atorvastatin  held outpatient   CAD: Status post stents - ContinueEliquis   Hypothyroidism - Continue home Synthroid    Glaucoma - Continue eyedrops once confirmed   History of DVT -Continue home Eliquis .  Goal of care-palliative met with patient and family, who likes to continue current scope of care with IV antibiotics.  Per palliative, family not interested in tube feed. -Appreciate help by palliative medicine  Increased nutrient needs Body mass index is 19.37 kg/m. - Consult dietitian          DVT prophylaxis:  Place and maintain sequential compression device Start: 06/18/24 1339  Code Status: DNR Family Communication: None at bedside Level of care: Telemetry Medical Status is: Inpatient Remains inpatient appropriate because: Severe sepsis, right hip abscess, right foot osteomyelitis   Final disposition:    55 minutes with more than 50% spent in reviewing records, counseling patient/family and coordinating care.   Sch Meds:  Scheduled Meds:  acetaminophen   1,000 mg Oral Q6H   collagenase    Topical Daily   docusate sodium   100 mg Oral BID   donepezil   5 mg Oral QHS   enoxaparin  (LOVENOX ) injection  50 mg Subcutaneous Q12H   levothyroxine   88 mcg Oral Daily   metroNIDAZOLE   500 mg Oral Q12H   polyethylene glycol  17 g Oral BID   potassium chloride   40 mEq Oral Q4H   risperiDONE   0.5 mg Oral  BID   sertraline   25 mg Oral Daily   sodium chloride  flush  3 mL Intravenous Q12H   Continuous Infusions:  cefTRIAXone  (ROCEPHIN )  IV 2 g (06/23/24 2216)   DAPTOmycin  400 mg (06/23/24 1726)   PRN Meds:.fentaNYL  (SUBLIMAZE ) injection, sodium chloride  flush, sorbitol   Antimicrobials: Anti-infectives (From admission, onward)    Start     Dose/Rate Route Frequency Ordered Stop   06/23/24 2200  metroNIDAZOLE  (FLAGYL ) tablet 500 mg        500 mg Oral Every 12 hours 06/23/24 1757     06/23/24 2000  cefTRIAXone  (ROCEPHIN ) 2 g in sodium chloride  0.9 % 100 mL IVPB        2 g 200 mL/hr over 30 Minutes Intravenous Every 24 hours 06/23/24 1429     06/23/24 1515  DAPTOmycin  (CUBICIN ) IVPB 500 mg/50mL premix  Status:  Discontinued        500 mg 100 mL/hr over 30 Minutes Intravenous Daily 06/23/24 1429 06/23/24 1502   06/23/24 1515  DAPTOmycin  (CUBICIN ) 400 mg in sodium chloride  0.9 % IVPB        400 mg 116 mL/hr over 30 Minutes Intravenous Daily 06/23/24 1502     06/22/24 1000  doxycycline  (VIBRA -TABS) tablet 100 mg  Status:  Discontinued  100 mg Oral Every 12 hours 06/20/24 0937 06/23/24 1429   06/20/24 1200  vancomycin  (VANCOCIN ) IVPB 1000 mg/200 mL premix  Status:  Discontinued        1,000 mg 200 mL/hr over 60 Minutes Intravenous Every 48 hours 06/18/24 1432 06/19/24 1540   06/20/24 1030  cefadroxil  (DURICEF) capsule 500 mg  Status:  Discontinued        500 mg Oral 2 times daily 06/20/24 0937 06/23/24 1429   06/20/24 0600  vancomycin  (VANCOREADY) IVPB 1250 mg/250 mL  Status:  Discontinued        1,250 mg 166.7 mL/hr over 90 Minutes Intravenous Every 48 hours 06/19/24 1540 06/20/24 0944   06/19/24 1600  cefTRIAXone  (ROCEPHIN ) 2 g in sodium chloride  0.9 % 100 mL IVPB  Status:  Discontinued        2 g 200 mL/hr over 30 Minutes Intravenous Every 24 hours 06/19/24 1417 06/20/24 0944   06/18/24 2000  aztreonam  (AZACTAM ) 1 g in sodium chloride  0.9 % 100 mL IVPB  Status:  Discontinued         1 g 200 mL/hr over 30 Minutes Intravenous Every 8 hours 06/18/24 1432 06/19/24 1417   06/18/24 1015  aztreonam  (AZACTAM ) 2 g in sodium chloride  0.9 % 100 mL IVPB        2 g 200 mL/hr over 30 Minutes Intravenous  Once 06/18/24 1008 06/18/24 1126   06/18/24 1015  vancomycin  (VANCOCIN ) IVPB 1000 mg/200 mL premix  Status:  Discontinued        1,000 mg 200 mL/hr over 60 Minutes Intravenous  Once 06/18/24 1008 06/18/24 1014   06/18/24 1015  vancomycin  (VANCOREADY) IVPB 1250 mg/250 mL        1,250 mg 166.7 mL/hr over 90 Minutes Intravenous  Once 06/18/24 1014 06/18/24 1321        I have personally reviewed the following labs and images: CBC: Recent Labs  Lab 06/18/24 0940 06/19/24 0554 06/20/24 0604 06/23/24 0231 06/24/24 0248  WBC 12.3* 8.7 8.3 4.7 3.9*  NEUTROABS 10.6*  --  6.9 2.8  --   HGB 12.5 10.1* 10.0* 10.0* 9.7*  HCT 38.3 31.1* 30.4* 30.5* 29.3*  MCV 86.7 86.4 86.4 85.4 85.4  PLT 440* 371 296 371 425*   BMP &GFR Recent Labs  Lab 06/18/24 0940 06/19/24 0554 06/20/24 0604 06/24/24 0248 06/24/24 0703  NA 135 134* 140 139  --   K 3.4* 2.9* 3.7 2.9*  --   CL 98 98 106 105  --   CO2 26 25 23 24   --   GLUCOSE 158* 89 125* 86  --   BUN 39* 22 12 15   --   CREATININE 1.15* 0.56 0.47 0.53  --   CALCIUM  9.0 8.5* 8.3* 7.9*  --   MG 2.3  --   --   --  1.9   Estimated Creatinine Clearance: 36.6 mL/min (by C-G formula based on SCr of 0.53 mg/dL). Liver & Pancreas: Recent Labs  Lab 06/18/24 0940 06/19/24 0554  AST 39 26  ALT 20 16  ALKPHOS 104 81  BILITOT 1.0 0.8  PROT 7.4 5.9*  ALBUMIN 2.3* 1.9*   No results for input(s): LIPASE, AMYLASE in the last 168 hours. No results for input(s): AMMONIA in the last 168 hours. Diabetic: No results for input(s): HGBA1C in the last 72 hours. Recent Labs  Lab 06/23/24 1559 06/23/24 2004 06/24/24 0008 06/24/24 0427 06/24/24 0823  GLUCAP 75 102* 84 87 87   Cardiac Enzymes:  Recent Labs  Lab 06/23/24 1646   CKTOTAL 16*   No results for input(s): PROBNP in the last 8760 hours. Coagulation Profile: No results for input(s): INR, PROTIME in the last 168 hours. Thyroid  Function Tests: No results for input(s): TSH, T4TOTAL, FREET4, T3FREE, THYROIDAB in the last 72 hours. Lipid Profile: No results for input(s): CHOL, HDL, LDLCALC, TRIG, CHOLHDL, LDLDIRECT in the last 72 hours. Anemia Panel: No results for input(s): VITAMINB12, FOLATE, FERRITIN, TIBC, IRON, RETICCTPCT in the last 72 hours. Urine analysis:    Component Value Date/Time   COLORURINE YELLOW 06/18/2024 0121   APPEARANCEUR HAZY (A) 06/18/2024 0121   LABSPEC >1.046 (H) 06/18/2024 0121   PHURINE 5.0 06/18/2024 0121   GLUCOSEU NEGATIVE 06/18/2024 0121   HGBUR SMALL (A) 06/18/2024 0121   BILIRUBINUR NEGATIVE 06/18/2024 0121   BILIRUBINUR neg 04/06/2023 1443   KETONESUR NEGATIVE 06/18/2024 0121   PROTEINUR NEGATIVE 06/18/2024 0121   UROBILINOGEN negative (A) 04/06/2023 1443   NITRITE NEGATIVE 06/18/2024 0121   LEUKOCYTESUR SMALL (A) 06/18/2024 0121   Sepsis Labs: Invalid input(s): PROCALCITONIN, LACTICIDVEN  Microbiology: Recent Results (from the past 240 hours)  Urine Culture     Status: None   Collection Time: 06/18/24  1:21 AM   Specimen: Urine, Random  Result Value Ref Range Status   Specimen Description URINE, RANDOM  Final   Special Requests URINE, CLEAN CATCH  Final   Culture   Final    NO GROWTH Performed at [facility], [address] [place], [place] [address]    Report Status 06/20/2024 FINAL  Final  Culture, blood (routine x 2)     Status: None (Preliminary result)   Collection Time: 06/18/24 10:30 AM   Specimen: BLOOD RIGHT ARM  Result Value Ref Range Status   Specimen Description BLOOD RIGHT ARM  Final   Special Requests   Final    BOTTLES DRAWN AEROBIC AND ANAEROBIC Blood Culture adequate volume   Culture   Final    NO GROWTH 4 DAYS Performed at [facility], [address] [place], [place] [address]    Report Status PENDING  Incomplete  Culture, blood (routine x 2)     Status: Abnormal   Collection Time: 06/18/24 10:35 AM   Specimen: BLOOD LEFT ARM  Result Value Ref Range Status   Specimen Description BLOOD LEFT ARM  Final   Special Requests   Final    BOTTLES DRAWN AEROBIC AND ANAEROBIC Blood Culture adequate volume   Culture  Setup Time   Final    GRAM POSITIVE COCCI IN CLUSTERS ANAEROBIC BOTTLE ONLY CRITICAL RESULT CALLED TO, READ BACK BY AND VERIFIED WITH: PHARMD EMILY SINCLAIR ON 06/19/24 @ 1516 BY DRT    Culture (A)  Final    STAPHYLOCOCCUS CAPITIS THE SIGNIFICANCE OF ISOLATING THIS ORGANISM FROM A SINGLE SET OF BLOOD CULTURES WHEN MULTIPLE SETS ARE DRAWN IS UNCERTAIN. PLEASE NOTIFY THE MICROBIOLOGY DEPARTMENT WITHIN ONE WEEK IF SPECIATION AND SENSITIVITIES ARE REQUIRED. Performed at [facility], [address], [place], [place] [address]    Report Status 06/21/2024 FINAL  Final  Blood Culture ID Panel (Reflexed)     Status: Abnormal   Collection Time: 06/18/24 10:35 AM  Result Value Ref Range Status   Enterococcus faecalis NOT DETECTED NOT DETECTED Final   Enterococcus Faecium NOT DETECTED NOT DETECTED Final   Listeria monocytogenes NOT DETECTED NOT DETECTED Final   Staphylococcus species DETECTED (A) NOT DETECTED Final    Comment: CRITICAL  RESULT CALLED TO, READ BACK BY AND VERIFIED WITH: PHARMD EMILY SINCLAIR ON 06/19/24 @ 1516 BY DRT    Staphylococcus aureus (BCID) NOT DETECTED NOT DETECTED Final   Staphylococcus epidermidis NOT DETECTED NOT DETECTED Final   Staphylococcus lugdunensis NOT DETECTED NOT DETECTED Final   Streptococcus species NOT DETECTED NOT DETECTED Final   Streptococcus agalactiae NOT DETECTED NOT DETECTED Final   Streptococcus pneumoniae NOT DETECTED NOT DETECTED Final   Streptococcus pyogenes NOT DETECTED NOT DETECTED Final   A.calcoaceticus-baumannii NOT DETECTED NOT DETECTED Final    Bacteroides fragilis NOT DETECTED NOT DETECTED Final   Enterobacterales NOT DETECTED NOT DETECTED Final   Enterobacter cloacae complex NOT DETECTED NOT DETECTED Final   Escherichia coli NOT DETECTED NOT DETECTED Final   Klebsiella aerogenes NOT DETECTED NOT DETECTED Final   Klebsiella oxytoca NOT DETECTED NOT DETECTED Final   Klebsiella pneumoniae NOT DETECTED NOT DETECTED Final   Proteus species NOT DETECTED NOT DETECTED Final   Salmonella species NOT DETECTED NOT DETECTED Final   Serratia marcescens NOT DETECTED NOT DETECTED Final   Haemophilus influenzae NOT DETECTED NOT DETECTED Final   Neisseria meningitidis NOT DETECTED NOT DETECTED Final   Pseudomonas aeruginosa NOT DETECTED NOT DETECTED Final   Stenotrophomonas maltophilia NOT DETECTED NOT DETECTED Final   Candida albicans NOT DETECTED NOT DETECTED Final   Candida auris NOT DETECTED NOT DETECTED Final   Candida glabrata NOT DETECTED NOT DETECTED Final   Candida krusei NOT DETECTED NOT DETECTED Final   Candida parapsilosis NOT DETECTED NOT DETECTED Final   Candida tropicalis NOT DETECTED NOT DETECTED Final   Cryptococcus neoformans/gattii NOT DETECTED NOT DETECTED Final    Comment: Performed at [facility] Lab, [address], [place], [place] [address]  MRSA Next Gen by PCR, Nasal     Status: None   Collection Time: 06/18/24  8:47 PM   Specimen: Nasal Mucosa; Nasal Swab  Result Value Ref Range Status   MRSA by PCR Next Gen NOT DETECTED NOT DETECTED Final    Comment: (NOTE) The GeneXpert MRSA Assay (FDA approved for NASAL specimens only), is one component of a comprehensive MRSA colonization surveillance program. It is not intended to diagnose MRSA infection nor to guide or monitor treatment for MRSA infections. Test performance is not FDA approved in patients less than 65 years old. Performed at [facility], [address] [place], [place] [address]     Radiology Studies: No results found.    Sivan Quast T.  Salvatrice Morandi [facility]  If 7PM-7AM, please contact night-coverage [email or web address] 06/24/2024, 10:14 AM

## 2024-06-24 NOTE — Evaluation (Signed)
 Occupational Therapy Evaluation & Discharge Patient Details Name: Sabrina Hart MRN: [id number] DOB: 20-Mar-1932 Today's Date: 06/24/2024   History of Present Illness   89 y.o. female admitted 06/18/24 with worsening confusion, constipation, drainage from chronic wounds. Workup for encephalopathy, sepsis, chronic bilateral gluteal and metatarsal wounds, osteomyelitis R first metatarsal head. Hydrotherapy initiated 06/21/24. PMH includes dementia, CAD, HTN, HLD, glaucoma, DVT.     Clinical Impressions Pt admitted based on above, and was seen based on problem list below. Per chart review pt lives with family and is w/c bound at baseline, unable to gain further information d/t pt's cog status. Today pt is likely at baseline of total +2 assist ADLs and mobility. Pt cog limiting eval, pt not following simple commands, eyes were closed, and mostly incoherent speech. Unable to fully determine ROM d/t pt actively resisting all movements. Pt would benefit from LTC placement, if family prefers home then pt can d/c with maximum family support and DME below. No further acute OT needs, OT is signing off on this pt.     If plan is discharge home, recommend the following:   Two people to help with walking and/or transfers;Two people to help with bathing/dressing/bathroom;Assistance with feeding;Direct supervision/assist for medications management;Assist for transportation;Direct supervision/assist for financial management;Help with stairs or ramp for entrance;Supervision due to cognitive status     Functional Status Assessment   Patient has not had a recent decline in their functional status     Equipment Recommendations   Hoyer lift;Hospital bed      Precautions/Restrictions   Precautions Precautions: Fall;Other (comment) Recall of Precautions/Restrictions: Impaired Precaution/Restrictions Comments: bilateral gluteal, R hip and R foot wounds; bladder/bowel  incontinence Restrictions Weight Bearing Restrictions Per Provider Order: No     Mobility Bed Mobility Overal bed mobility: Needs Assistance Bed Mobility: Rolling Rolling: Total assist, +2 for physical assistance, +2 for safety/equipment, Used rails         General bed mobility comments: totalA+2 for rolling R/L (only tolerating partial roll onto R-side) and scooting up in bed; pt actively resisting majority of mobility, though able to maintain grips on bed rail when hands assisted there    Transfers   General transfer comment: Unsafe at this time      Balance Overall balance assessment: Needs assistance       ADL either performed or assessed with clinical judgement   ADL Overall ADL's : At baseline   General ADL Comments: Pt likely at functional baseline of dependent for all ADLs. Pt actively resting all efforts to complete self-feeding, but with fair hand to mouth ROM and grip strength     Vision   Additional Comments: Unable to assess d/t cog            Pertinent Vitals/Pain Pain Assessment Pain Assessment: Faces Faces Pain Scale: Hurts even more Pain Location: grimacing with attempts at BLE extension, unable to specify pain location/details Pain Descriptors / Indicators: Grimacing Pain Intervention(s): Limited activity within patient's tolerance     Extremity/Trunk Assessment Upper Extremity Assessment Upper Extremity Assessment: Difficult to assess due to impaired cognition RUE Deficits / Details: demonstrates good hand ROM and grip strength, pt actively resting PROM attempts. Pt likely able to bring hand to mouth for self-feeding LUE Deficits / Details: demonstrates good hand ROM and grip strength, pt actively resting PROM attempts. Pt likely able to bring hand to mouth for self-feeding   Lower Extremity Assessment Lower Extremity Assessment: Defer to PT evaluation RLE Deficits / Details: BLEs resting with  significant hip/knee flexion, pt actively  resisting attempts to extend, difficult to determine resistance vs contracture vs tone. R hip and foot wound wrapped LLE Deficits / Details: BLEs resting with significant hip/knee flexion, pt actively resisting attempts to extend, difficult to determine resistance vs contracture vs tone   Cervical / Trunk Assessment Cervical / Trunk Assessment: Kyphotic   Communication Communication Communication: Impaired Factors Affecting Communication: Reduced clarity of speech   Cognition Arousal: Lethargic Behavior During Therapy: Flat affect Cognition: History of cognitive impairments     OT - Cognition Comments: hx of dementia, unclear of baseline. Pt often with eyes closed but engaging in mostly incoherent speech during session, not following any commands       Following commands: Impaired Following commands impaired: Follows one step commands inconsistently     Cueing  General Comments   Cueing Techniques: Verbal cues  Pt with episode of bowel/bladder incontinence, OT/PT assisted RN with hygiene           Home Living Family/patient expects to be discharged to:: Private residence Living Arrangements: Children        Prior Functioning/Environment Prior Level of Function : Patient poor historian/Family not available     Mobility Comments: per chart, pt from home with family, wheelchair-bound (has manual w/c in hospital room)      OT Problem List: Decreased strength;Decreased range of motion;Impaired balance (sitting and/or standing);Decreased activity tolerance;Cardiopulmonary status limiting activity        OT Goals(Current goals can be found in the care plan section)   Acute Rehab OT Goals Patient Stated Goal: None stated OT Goal Formulation: All assessment and education complete, DC therapy Time For Goal Achievement: 07/08/24 Potential to Achieve Goals: Poor      Co-evaluation   Reason for Co-Treatment: Necessary to address cognition/behavior during functional  activity;For patient/therapist safety;To address functional/ADL transfers PT goals addressed during session: Mobility/safety with mobility        AM-PAC OT 6 Clicks Daily Activity     Outcome Measure Help from another person eating meals?: A Lot Help from another person taking care of personal grooming?: Total Help from another person toileting, which includes using toliet, bedpan, or urinal?: Total Help from another person bathing (including washing, rinsing, drying)?: Total Help from another person to put on and taking off regular upper body clothing?: Total Help from another person to put on and taking off regular lower body clothing?: Total 6 Click Score: 7   End of Session Nurse Communication: Mobility status  Activity Tolerance: Patient limited by pain Patient left: in bed;with call bell/phone within reach;with nursing/sitter in room  OT Visit Diagnosis: Muscle weakness (generalized) (M62.81)                Time: 8965-8945 OT Time Calculation (min): 20 min Charges:  OT General Charges $OT Visit: 1 Visit OT Evaluation $OT Eval Moderate Complexity: 1 Mod  Adrianne BROCKS, OT  Acute Rehabilitation Services Office [phone] Secure chat preferred   Adrianne GORMAN Savers 06/24/2024, 12:35 PM

## 2024-06-24 NOTE — Progress Notes (Signed)
 Initial Nutrition Assessment  DOCUMENTATION CODES:   Not applicable  INTERVENTION:   -Continue regular diet -MVI with minerals daily -500 mg vitamin C  BID -220 mg zinc  sulfate daily x 14 days -Ensure Plus High Protein po TID, each supplement provides 350 kcal and 20 grams of protein  -30 ml Prosource plus TID, each supplement provides 100 kcals and 15 grams protein -RD will check labs to assess for possible micronutrient deficiencies which may impede wound healing: vitamin A   NUTRITION DIAGNOSIS:   Increased nutrient needs related to wound healing as evidenced by estimated needs.  GOAL:   Patient will meet greater than or equal to 90% of their needs  MONITOR:   PO intake, Supplement acceptance  REASON FOR ASSESSMENT:   Consult Assessment of nutrition requirement/status, Wound healing, Poor PO  ASSESSMENT:   Sabrina Hart with medical history significant of hypertension, hyperlipidemia, hypothyroidism, CAD status post stent, dementia, glaucoma, DVT presenting with confusion, wound changes, constipation.  Sabrina Hart admitted with severe sepsis due to infected decubitus ulcers, rt hip abscess, rt foot abscess, and osteomyelitis.   7/22- MRI of rt foot revealed osteomyelitis of first metatarsal head and associated abscess; rt hip wound stable 7/24- hydrotherapy of rt hip initiated  Reviewed I/O's: +60 ml x 24 hours and +2.3 L since admission  Sabrina Hart unavailable at time of visit. Attempted to speak with Sabrina Hart via call to hospital room phone, however, unable to reach. RD unable to obtain further nutrition-related history or complete nutrition-focused physical exam at this time.    Per orthopedics notes, family does not desire surgical intervention for wounds and would like to continue with local wound care and offloading. General surgery and orthopedics now not recommending any surgical intervention.  Per [employer] notes, Sabrina Hart with multiple chronic pressure injuries: unstageable rt medial foot pressure  injury with gangrenous black soft eschar, unstageable pressure injury to rt trochanter/ hip, deep tissue pressure injury to lt trochanter with evolving necrotic tissue, deep tissue pressure injury to rt lateral foot, deep tissue pressure injury to rt ankle, and stage 1 pressure injury to rt knee.   Sabrina Hart currently on a regular diet. Noted meal completions 10-75%, averaging 10-20% of meals. Per palliative care notes, family reports Sabrina Hart sometimes refuses care, medications, and meals. Per palliative care, Sabrina Hart family does not desire tube feedings. Given Sabrina Hart's advanced age and dementia, would not recommend alternative means of nutrition/ hydration as this would not improve Sabrina Hart's quality of life. Sabrina Hart would greatly benefit from supplements to assist with wound healing.  Noted distant history of weight loss. Highly suspect Sabrina Hart with malnutrition, however, unable to identify at this time. Per nursing assessment, Sabrina Hart with lower extremity edema, which may be masking true weight loss as well as fat and muscle depletions.   Palliative care following for goals of care discussions; plan for continued current scope and medical management. Family would like to allow time for outcomes.   Medications reviewed and include colace, lovenox , miralax , and potassium chloride .  Labs reviewed: K: 2.9, CBGS: 87-121 (inpatient orders for glycemic control are none).    Diet Order:   Diet Order             Diet regular Room service appropriate? Yes; Fluid consistency: Thin  Diet effective now                   EDUCATION NEEDS:   No education needs have been identified at this time  Skin:  Skin Assessment: Skin Integrity Issues: Skin Integrity Issues:: Unstageable,  DTI, Stage I DTI: lt trochanter with evolving necrotic tisse, rt lateral foot, rt ankle Stage I: rt knee Unstageable: rt medial foot with gangrenous black soft eschar, rt trochanter/ hip  Last BM:  06/24/26  Height:   Ht Readings from Last 1 Encounters:   06/18/24 5' 3 (1.6 m)    Weight:   Wt Readings from Last 1 Encounters:  06/18/24 49.6 kg    Ideal Body Weight:  52.3 kg  BMI:  Body mass index is 19.37 kg/m.  Estimated Nutritional Needs:   Kcal:  1500-1700  Protein:  80-95 grams  Fluid:  1.5-1.7 L    Margery ORN, RD, LDN, CDCES Registered Dietitian III Certified Diabetes Care and Education Specialist If unable to reach this RD, please use RD Inpatient group chat on secure chat between hours of 8am-4 pm daily

## 2024-06-24 NOTE — Progress Notes (Signed)
 [facility] Infectious Diseases Follow Up Note  Patient Identification: Patient Name: Sabrina Hart MRN: [id number] Admit Date: 06/18/2024  9:26 AM Age: 89 y.o.Today's Date: 06/24/2024  Reason for Visit: Abscess/osteomyelitis, OPAT arrangement  Principal Problem:   SIRS (systemic inflammatory response syndrome) (HCC) Active Problems:   CAD (coronary artery disease)   Hypothyroidism   Hyperlipidemia   Severe late onset Alzheimer's dementia (HCC)   Essential hypertension   Primary open angle glaucoma (POAG) of both eyes   Pressure injury of right hip, unstageable (HCC)   Gangrene of right foot (HCC)   Pressure injury of skin  Antibiotics:  Aztreonam  7/21-7/22 Vancomycin  7/21-7/22 Doxycycline  7/25-7/26 Ceftriaxone  7/22, cefadroxil  7/23-7-26 Daptomycin , ceftriaxone  and metronidazole  7/26-c   Lines/Hardware: Right brachial midline  Interval events: Remains afebrile.  Labs remarkable for K2.9, WBC 3.9, hemoglobin 9.7, platelets 425   Assessment 89 year old female with prior history of HTN, HLD, arthritis, glaucoma, hypothyroidism, CAD s/p stent, dementia, DVT on AC, wheelchair-bound at baseline who presented to the ED on 7/21 with altered mental status, constipation and evaluation of chronic bilateral gluteal decubitus ulcer as well as right foot due to increased bloody drainage after starting Eliquis  for DVT. Admitted with   # Multiple decubitus pressure wounds (right medial foot, right lateral foot, right ankle, right knee, right trochanter, left trochanter)   # Abscess and osteomyelitis of the first metatarsal head, sesamoids, base of the first proximal phalanx, first MTP septic arthritis   # Right hip abscess measuring 12 x 2 x 12 cm with possible septic arthritis - Considered non surgical candidate by orthopedics as well as general surgery. Not considered a candidate by IR for drainage of abscess as well.    # Staph  capitis 1 out of 4 bottles-likely contaminant   # Dementia # Wheel chair bund  Recommendations - Plan for 6 weeks of IV daptomycin , ceftriaxone  and metronidazole  ( see OPAT below) with possible indefinite po suppression due to being non surgical and non IR intervention candidate.  Very possible that medical treatment will fail without source control. Plan to repeat imaging during fu OP. ID pharmacy to place OPAT tomorrow  - Replace midline with PICC line for prolonged antibiotics - Monitor CBC CMP and CPK - Continue wound care - Maintain universal/standard isolation precautions - ID will os D/w primary team and palliative team.  OPAT Diagnosis: Right hip abscess, septic arthritis, right first metatarsal osteomyelitis  Culture Result: No cultures  Allergies  Allergen Reactions   Other Other (See Comments)    Metals; skin gets weepy; gets worse if it makes contact (09/28/2012)   Pollen Extract Other (See Comments)    sneezing; watery eyes   Latex Itching, Swelling and Other (See Comments)    Skin swells   Morphine And Codeine Rash and Other (See Comments)    Patient does not prefer to take these, but if absolutely necessary, accommodations can be made.   Nickel Other (See Comments)    Causes weepiness (skin)   Penicillins Rash    Tolerates ceftriaxone  and cefadroxil     OPAT Orders Discharge antibiotics to be given via PICC line Discharge antibiotics: Daptomycin , ceftriaxone  and metronidazole  Per pharmacy protocol  Duration: 6 weeks End Date: 08/05/24  [facility] Care Per Protocol:  Home health RN for IV administration and teaching; PICC line care and labs.    Labs weekly while on IV antibiotics: _X_ CBC with differential __ BMP _X_ CMP _X_ CRP _X_ ESR __ Vancomycin  trough __ CK  __ Please pull PIC  at completion of IV antibiotics _X_ Please leave PIC in place until doctor has seen patient or been notified  Fax weekly labs to [phone]  Clinic Follow Up  Appt: 8/21 at 10 am   Rest of the management as per the primary team. Thank you for the consult. Please page with pertinent questions or concerns.  ______________________________________________________________________ Subjective patient seen and examined at the bedside. No new changes.   Vitals BP 130/88 (BP Location: Left Arm)   Pulse 93   Temp 98.8 F (37.1 C) (Oral)   Resp 17   Ht 5' 3 (1.6 m)   Wt 49.6 kg   SpO2 100%   BMI 19.37 kg/m     Physical Exam Constitutional: Chronically ill-appearing elderly female lying in the bed    Comments: HEENT grossly normal  Cardiovascular:     Rate and Rhythm: Normal rate and regular rhythm.     Heart sounds:   Pulmonary:     Effort: Pulmonary effort is normal.     Comments:   Abdominal:     Palpations: Abdomen is soft.     Tenderness:   Musculoskeletal:        General: No pedal edema  Skin:    Comments: Multiple decubitus ulcer bandaged C/D/I  Neurological:     General: demented  Pertinent Microbiology Results for orders placed or performed during the hospital encounter of 06/18/24  Urine Culture     Status: None   Collection Time: 06/18/24  1:21 AM   Specimen: Urine, Random  Result Value Ref Range Status   Specimen Description URINE, RANDOM  Final   Special Requests URINE, CLEAN CATCH  Final   Culture   Final    NO GROWTH Performed at [facility], [address] [place], [place] [address]    Report Status 06/20/2024 FINAL  Final  Culture, blood (routine x 2)     Status: None (Preliminary result)   Collection Time: 06/18/24 10:30 AM   Specimen: BLOOD RIGHT ARM  Result Value Ref Range Status   Specimen Description BLOOD RIGHT ARM  Final   Special Requests   Final    BOTTLES DRAWN AEROBIC AND ANAEROBIC Blood Culture adequate volume   Culture   Final    NO GROWTH 4 DAYS Performed at [facility], [address] [place], [place] [address]    Report Status PENDING  Incomplete  Culture, blood  (routine x 2)     Status: Abnormal   Collection Time: 06/18/24 10:35 AM   Specimen: BLOOD LEFT ARM  Result Value Ref Range Status   Specimen Description BLOOD LEFT ARM  Final   Special Requests   Final    BOTTLES DRAWN AEROBIC AND ANAEROBIC Blood Culture adequate volume   Culture  Setup Time   Final    GRAM POSITIVE COCCI IN CLUSTERS ANAEROBIC BOTTLE ONLY CRITICAL RESULT CALLED TO, READ BACK BY AND VERIFIED WITH: PHARMD EMILY SINCLAIR ON 06/19/24 @ 1516 BY DRT    Culture (A)  Final    STAPHYLOCOCCUS CAPITIS THE SIGNIFICANCE OF ISOLATING THIS ORGANISM FROM A SINGLE SET OF BLOOD CULTURES WHEN MULTIPLE SETS ARE DRAWN IS UNCERTAIN. PLEASE NOTIFY THE MICROBIOLOGY DEPARTMENT WITHIN ONE WEEK IF SPECIATION AND SENSITIVITIES ARE REQUIRED. Performed at [facility], [address] [place], [place] [address]    Report Status 06/21/2024 FINAL  Final  Blood Culture ID Panel (Reflexed)     Status: Abnormal   Collection Time: 06/18/24 10:35 AM  Result Value Ref Range Status   Enterococcus faecalis NOT DETECTED NOT DETECTED Final   Enterococcus Faecium NOT DETECTED NOT DETECTED Final   Listeria monocytogenes NOT DETECTED NOT DETECTED Final   Staphylococcus species DETECTED (A) NOT DETECTED Final    Comment: CRITICAL RESULT CALLED TO, READ BACK BY AND VERIFIED WITH: PHARMD EMILY SINCLAIR ON 06/19/24 @ 1516 BY DRT    Staphylococcus aureus (BCID) NOT DETECTED NOT DETECTED Final   Staphylococcus epidermidis NOT DETECTED NOT DETECTED Final   Staphylococcus lugdunensis NOT DETECTED NOT DETECTED Final   Streptococcus species NOT DETECTED NOT DETECTED Final   Streptococcus agalactiae NOT DETECTED NOT DETECTED Final   Streptococcus pneumoniae NOT DETECTED NOT DETECTED Final   Streptococcus pyogenes NOT DETECTED NOT DETECTED Final   A.calcoaceticus-baumannii NOT DETECTED NOT DETECTED Final   Bacteroides fragilis NOT DETECTED NOT DETECTED Final   Enterobacterales NOT DETECTED NOT DETECTED Final    Enterobacter cloacae complex NOT DETECTED NOT DETECTED Final   Escherichia coli NOT DETECTED NOT DETECTED Final   Klebsiella aerogenes NOT DETECTED NOT DETECTED Final   Klebsiella oxytoca NOT DETECTED NOT DETECTED Final   Klebsiella pneumoniae NOT DETECTED NOT DETECTED Final   Proteus species NOT DETECTED NOT DETECTED Final   Salmonella species NOT DETECTED NOT DETECTED Final   Serratia marcescens NOT DETECTED NOT DETECTED Final   Haemophilus influenzae NOT DETECTED NOT DETECTED Final   Neisseria meningitidis NOT DETECTED NOT DETECTED Final   Pseudomonas aeruginosa NOT DETECTED NOT DETECTED Final   Stenotrophomonas maltophilia NOT DETECTED NOT DETECTED Final   Candida albicans NOT DETECTED NOT DETECTED Final   Candida auris NOT DETECTED NOT DETECTED Final   Candida glabrata NOT DETECTED NOT DETECTED Final   Candida krusei NOT DETECTED NOT DETECTED Final   Candida parapsilosis NOT DETECTED NOT DETECTED Final   Candida tropicalis NOT DETECTED NOT DETECTED Final   Cryptococcus neoformans/gattii NOT DETECTED NOT DETECTED Final    Comment: Performed at [facility], [address] [place], [place] [address]  MRSA Next Gen by PCR, Nasal     Status: None   Collection Time: 06/18/24  8:47 PM   Specimen: Nasal Mucosa; Nasal Swab  Result Value Ref Range Status   MRSA by PCR Next Gen NOT DETECTED NOT DETECTED Final    Comment: (NOTE) The GeneXpert MRSA Assay (FDA approved for NASAL specimens only), is one component of a comprehensive MRSA colonization surveillance program. It is not intended to diagnose MRSA infection nor to guide or monitor treatment for MRSA infections. Test performance is not FDA approved in patients less than 52 years old. Performed at [facility], [address] [place], [place] [address]     Pertinent Lab.    Latest Ref Rng & Units 06/24/2024    2:48 AM 06/23/2024    2:31 AM 06/20/2024    6:04 AM  CBC  WBC 4.0 - 10.5 K/uL 3.9  4.7  8.3   Hemoglobin  12.0 - 15.0 g/dL 9.7  89.9  89.9   Hematocrit 36.0 - 46.0 % 29.3  30.5  30.4   Platelets 150 - 400 K/uL 425  371  296       Latest Ref Rng & Units 06/24/2024    2:48 AM 06/20/2024    6:04 AM 06/19/2024    5:54 AM  CMP  Glucose 70 - 99 mg/dL 86  874  89   BUN 8 - 23 mg/dL 15  12  22    Creatinine 0.44 - 1.00  mg/dL 9.46  9.52  9.43   Sodium 135 - 145 mmol/L 139  140  134   Potassium 3.5 - 5.1 mmol/L 2.9  3.7  2.9   Chloride 98 - 111 mmol/L 105  106  98   CO2 22 - 32 mmol/L 24  23  25    Calcium  8.9 - 10.3 mg/dL 7.9  8.3  8.5   Total Protein 6.5 - 8.1 g/dL   5.9   Total Bilirubin 0.0 - 1.2 mg/dL   0.8   Alkaline Phos 38 - 126 U/L   81   AST 15 - 41 U/L   26   ALT 0 - 44 U/L   16   \  Pertinent Imaging today Plain films and CT images have been personally visualized and interpreted; radiology reports have been reviewed. Decision making incorporated into the Impression /   No results found.  I spent 50 minutes involved in face-to-face and non-face-to-face activities for this patient on the day of the visit. Professional time spent includes the following activities: Preparing to see the patient (review of tests), Obtaining and reviewing separately obtained history (notes from hospitalist and palliative care), Performing a medically appropriate examination and evaluation , Ordering medications/labs, referring and communicating with other health care professionals, Documenting clinical information in the EMR, Independently interpreting results (not separately reported), and Care coordination (not separately reported).   Plan d/w requesting provider as well as ID pharm D  Of note, portions of this note may have been created with voice recognition software. While this note has been edited for accuracy, occasional wrong-word or 'sound-a-like' substitutions may have occurred due to the inherent limitations of voice recognition software.   Electronically signed by:   Annalee Orem,  MD Infectious Disease Physician [facility] Infectious [facility] Pager: [phone]

## 2024-06-25 ENCOUNTER — Other Ambulatory Visit: Payer: Self-pay

## 2024-06-25 DIAGNOSIS — R6510 Systemic inflammatory response syndrome (SIRS) of non-infectious origin without acute organ dysfunction: Secondary | ICD-10-CM | POA: Diagnosis not present

## 2024-06-25 DIAGNOSIS — Z7189 Other specified counseling: Secondary | ICD-10-CM | POA: Diagnosis not present

## 2024-06-25 DIAGNOSIS — Z515 Encounter for palliative care: Secondary | ICD-10-CM | POA: Diagnosis not present

## 2024-06-25 DIAGNOSIS — A419 Sepsis, unspecified organism: Secondary | ICD-10-CM | POA: Diagnosis not present

## 2024-06-25 LAB — CBC
HCT: 32.6 % — ABNORMAL LOW (ref 36.0–46.0)
Hemoglobin: 10.5 g/dL — ABNORMAL LOW (ref 12.0–15.0)
MCH: 27.8 pg (ref 26.0–34.0)
MCHC: 32.2 g/dL (ref 30.0–36.0)
MCV: 86.2 fL (ref 80.0–100.0)
Platelets: 498 K/uL — ABNORMAL HIGH (ref 150–400)
RBC: 3.78 MIL/uL — ABNORMAL LOW (ref 3.87–5.11)
RDW: 14 % (ref 11.5–15.5)
WBC: 6.5 K/uL (ref 4.0–10.5)
nRBC: 0 % (ref 0.0–0.2)

## 2024-06-25 LAB — RENAL FUNCTION PANEL
Albumin: 1.9 g/dL — ABNORMAL LOW (ref 3.5–5.0)
Anion gap: 10 (ref 5–15)
BUN: 10 mg/dL (ref 8–23)
CO2: 22 mmol/L (ref 22–32)
Calcium: 7.9 mg/dL — ABNORMAL LOW (ref 8.9–10.3)
Chloride: 107 mmol/L (ref 98–111)
Creatinine, Ser: 0.55 mg/dL (ref 0.44–1.00)
GFR, Estimated: >60 mL/min (ref >60)
Glucose, Bld: 79 mg/dL (ref 70–99)
Phosphorus: 2.6 mg/dL (ref 2.5–4.6)
Potassium: 3.7 mmol/L (ref 3.5–5.1)
Sodium: 139 mmol/L (ref 135–145)

## 2024-06-25 LAB — GLUCOSE, CAPILLARY
Glucose-Capillary: 74 mg/dL (ref 70–99)
Glucose-Capillary: 83 mg/dL (ref 70–99)
Glucose-Capillary: 87 mg/dL (ref 70–99)
Glucose-Capillary: 76 mg/dL (ref 70–99)
Glucose-Capillary: 115 mg/dL — ABNORMAL HIGH (ref 70–99)
Glucose-Capillary: 59 mg/dL — ABNORMAL LOW (ref 70–99)

## 2024-06-25 LAB — CULTURE, BLOOD (ROUTINE X 2)
Culture: NO GROWTH
Special Requests: ADEQUATE

## 2024-06-25 LAB — MAGNESIUM: Magnesium: 1.9 mg/dL (ref 1.7–2.4)

## 2024-06-25 MED ORDER — SODIUM CHLORIDE 0.9% FLUSH
10.0000 mL | Freq: Two times a day (BID) | INTRAVENOUS | Status: DC
  Administered 2024-06-25 – 2024-06-26 (×3): 10 mL

## 2024-06-25 MED ORDER — SODIUM CHLORIDE 0.9% FLUSH: 10.0000 mL | INTRAVENOUS | Status: DC | PRN

## 2024-06-25 MED ORDER — DEXTROSE 50 % IV SOLN
INTRAVENOUS | Status: AC
  Administered 2024-06-25: 12.5 g via INTRAVENOUS
  Filled 2024-06-25: qty 50

## 2024-06-25 MED ORDER — DEXTROSE 50 % IV SOLN: 12.5000 g | INTRAVENOUS | Status: AC

## 2024-06-25 MED ORDER — CHLORHEXIDINE GLUCONATE CLOTH 2 % EX PADS
6.0000 | MEDICATED_PAD | Freq: Every day | CUTANEOUS | Status: DC
  Administered 2024-06-25 – 2024-07-01 (×7): 6 via TOPICAL

## 2024-06-25 NOTE — Progress Notes (Addendum)
 PROGRESS NOTE  Sabrina Hart FMW:[id number] DOB: September 18, 1932   PCP: [name], MD  Patient is from: Home.  DOA: 06/18/2024 LOS: 7  Chief complaints Chief Complaint  Patient presents with   Wound Check     Brief Narrative / Interim history: 89 year old F with PMH of dementia, CAD/stent, HTN, HLD, hypothyroidism, glaucoma, DVT on Eliquis  and chronic bilateral gluteal decubitus and right foot ulcer presenting with increased CPH/bloody drainage after starting Eliquis  for DVT, decline in mentation and activity 4 weeks, and admitted with severe sepsis due to infected decubitus ulcer, right hip abscess and osteomyelitis of first metatarsal head of the right foot.    CT abdomen pelvis showed crescentic abscess at the right hip measuring 12 x 2 x 12 cm, prominent stool favoring constipation, mesenteric edema, severe arthropathy of the hips possible synovitis in the right hip.   Patient received vancomycin , aztreonam  in the ED in the setting of penicillin allergy. Was also diagnosed with right foot abscess and osteomyelitis.  Orthopedic and general surgery consulted, and did not feel there is indication for I&D and recommended wound care and palliative care consult.  After meeting with palliative care, family decided to continue current scope of care with antibiotics.  ID consulted on 7/26, and escalated antibiotics to daptomycin , ceftriaxone  and Flagyl .  Subjective: Seen and examined earlier this morning.  No major events overnight or this morning.  Sleepy but wakes to voice.  She mumbles.   Objective: Vitals:   06/24/24 2238 06/25/24 0000 06/25/24 0400 06/25/24 0649  BP: (!) 141/69 118/75 (!) 107/54 132/62  Pulse: 92 83 74 (!) 102  Resp: 20 19 15 20   Temp: 99.3 F (37.4 C)  99.4 F (37.4 C) 99.2 F (37.3 C)  TempSrc: Axillary  Axillary Axillary  SpO2: 97% 97% 97% 97%  Weight:      Height:        Examination:  GENERAL: No apparent distress.  Appears frail. HEENT: MMM.   Vision and hearing grossly intact.  NECK: Supple.  No apparent JVD.  RESP:  No IWOB.  Fair aeration bilaterally. CVS:  RRR. Heart sounds normal.  ABD/GI/GU: BS+. Abd soft, NTND.  MSK/EXT: Seems to have some contractures.  Bilateral mittens.  Prevalon boots NEURO: Sleepy but wakes to voice.  Mumbles some words.  No facial symmetry.  Limited exam due to mental status. PSYCH: Calm.  No distress or agitation.  Consultants:  Orthopedic surgery General surgery Palliative medicine Infectious disease Interventional radiology  Procedures: None  Microbiology summarized: MRSA PCR screen nonreactive Blood culture with Staph capitis in 1 out of 2 bottles  Assessment and plan: Severe sepsis due to infected decubitus ulcers, right hip abscess and right foot abscess and osteomyelitis: Present on admission.  Had tachypnea and leukocytosis and lactic acid of 3.7. -CT abdomen and pelvis as above. -MRI of right foot ulceration medial to the first MTP joint with underlying abscess and osteomyelitis of the first metatarsal head, its sesmoids and the base of first proximal phalanx -Evaluated by Dr. Stan to be not a candidate for endovascular evaluation due to her flexor contracture -Ortho and surgery did not feel I&D is indicated.  IR concurred with surgery and Ortho. -Surgery recommended hydrotherapy, WTD, air mattress and frequent turning and offloading. -Family expressed desire to continue current scope of care with antibiotics.  No tube feed. -Was Duricef and doxy.  ID consulted on 7/26 and escalated to daptomycin , CTX and Flagyl  -PICC line   Acute septic encephalopathy, POA: sleepy  but wakes to voice.  Only mumbles words. Dementia without behavioral disturbance -Continue home donepezil , risperidone , sertraline  once confirmed  Positive blood culture: Blood culture with Staphylococcus capitis in 1 out of 2 bottles likely contaminant   Right kidney lesion: Favoring a cyst but is  indeterminant -MRI recommended for follow-up but doubt utility given patient's age and poor prognosis   Hypoglycemia: 47 initially, required dextrose  fluid.  Now on diet, sugar controlled.   Constipation: MiraLAX  and Fleet enema and Colace ordered.  Constipation resolved.   Hypertension normotensive off home antihypertensive meds.  AKI: Likely ATN due to severe sepsis.  Resolved.   Hypokalemia -Monitor replenish K and Mg as appropriate   Mild hyponatremia: Resolved.  Normocytic anemia: Stable.   Hyperlipidemia - Atorvastatin  held outpatient   CAD: Status post stents - ContinueEliquis   Hypothyroidism - Continue home Synthroid    Glaucoma - Continue eyedrops once confirmed   History of DVT -Continue home Eliquis .  Goal of care-palliative met with patient and family, who likes to continue current scope of care with IV antibiotics.  Per palliative, family not interested in tube feed. -Appreciate help by palliative medicine  Increased nutrient needs Body mass index is 19.37 kg/m. - Consult dietitian  Nutrition Problem: Increased nutrient needs Etiology: wound healing Signs/Symptoms: estimated needs Interventions: Ensure Enlive (each supplement provides 350kcal and 20 grams of protein), MVI, Prostat   DVT prophylaxis:  Place and maintain sequential compression device Start: 06/18/24 1339  Code Status: DNR Family Communication: None at bedside Level of care: Telemetry Medical Status is: Inpatient Remains inpatient appropriate because: Severe sepsis, right hip abscess, right foot osteomyelitis   Final disposition: LTC   35 minutes with more than 50% spent in reviewing records, counseling patient/family and coordinating care.   Sch Meds:  Scheduled Meds:  (feeding supplement) PROSource Plus  30 mL Oral TID BM   acetaminophen   1,000 mg Oral Q6H   ascorbic acid   500 mg Oral BID   collagenase    Topical Daily   docusate sodium   100 mg Oral BID   donepezil   5 mg  Oral QHS   enoxaparin  (LOVENOX ) injection  50 mg Subcutaneous Q12H   feeding supplement  237 mL Oral TID BM   levothyroxine   88 mcg Oral Daily   metroNIDAZOLE   500 mg Oral Q12H   multivitamin with minerals  1 tablet Oral Daily   polyethylene glycol  17 g Oral BID   risperiDONE   0.5 mg Oral BID   sertraline   25 mg Oral Daily   sodium chloride  flush  3 mL Intravenous Q12H   zinc  sulfate (50mg  elemental zinc )  220 mg Oral Daily   Continuous Infusions:  cefTRIAXone  (ROCEPHIN )  IV Stopped (06/24/24 2042)   DAPTOmycin  400 mg (06/24/24 1533)   PRN Meds:.fentaNYL  (SUBLIMAZE ) injection, sodium chloride  flush, sorbitol   Antimicrobials: Anti-infectives (From admission, onward)    Start     Dose/Rate Route Frequency Ordered Stop   06/23/24 2200  metroNIDAZOLE  (FLAGYL ) tablet 500 mg        500 mg Oral Every 12 hours 06/23/24 1757     06/23/24 2000  cefTRIAXone  (ROCEPHIN ) 2 g in sodium chloride  0.9 % 100 mL IVPB        2 g 200 mL/hr over 30 Minutes Intravenous Every 24 hours 06/23/24 1429     06/23/24 1515  DAPTOmycin  (CUBICIN ) IVPB 500 mg/50mL premix  Status:  Discontinued        500 mg 100 mL/hr over 30 Minutes Intravenous Daily 06/23/24  1429 06/23/24 1502   06/23/24 1515  DAPTOmycin  (CUBICIN ) 400 mg in sodium chloride  0.9 % IVPB        400 mg 116 mL/hr over 30 Minutes Intravenous Daily 06/23/24 1502     06/22/24 1000  doxycycline  (VIBRA -TABS) tablet 100 mg  Status:  Discontinued        100 mg Oral Every 12 hours 06/20/24 0937 06/23/24 1429   06/20/24 1200  vancomycin  (VANCOCIN ) IVPB 1000 mg/200 mL premix  Status:  Discontinued        1,000 mg 200 mL/hr over 60 Minutes Intravenous Every 48 hours 06/18/24 1432 06/19/24 1540   06/20/24 1030  cefadroxil  (DURICEF) capsule 500 mg  Status:  Discontinued        500 mg Oral 2 times daily 06/20/24 0937 06/23/24 1429   06/20/24 0600  vancomycin  (VANCOREADY) IVPB 1250 mg/250 mL  Status:  Discontinued        1,250 mg 166.7 mL/hr over 90 Minutes  Intravenous Every 48 hours 06/19/24 1540 06/20/24 0944   06/19/24 1600  cefTRIAXone  (ROCEPHIN ) 2 g in sodium chloride  0.9 % 100 mL IVPB  Status:  Discontinued        2 g 200 mL/hr over 30 Minutes Intravenous Every 24 hours 06/19/24 1417 06/20/24 0944   06/18/24 2000  aztreonam  (AZACTAM ) 1 g in sodium chloride  0.9 % 100 mL IVPB  Status:  Discontinued        1 g 200 mL/hr over 30 Minutes Intravenous Every 8 hours 06/18/24 1432 06/19/24 1417   06/18/24 1015  aztreonam  (AZACTAM ) 2 g in sodium chloride  0.9 % 100 mL IVPB        2 g 200 mL/hr over 30 Minutes Intravenous  Once 06/18/24 1008 06/18/24 1126   06/18/24 1015  vancomycin  (VANCOCIN ) IVPB 1000 mg/200 mL premix  Status:  Discontinued        1,000 mg 200 mL/hr over 60 Minutes Intravenous  Once 06/18/24 1008 06/18/24 1014   06/18/24 1015  vancomycin  (VANCOREADY) IVPB 1250 mg/250 mL        1,250 mg 166.7 mL/hr over 90 Minutes Intravenous  Once 06/18/24 1014 06/18/24 1321        I have personally reviewed the following labs and images: CBC: Recent Labs  Lab 06/18/24 0940 06/19/24 0554 06/20/24 0604 06/23/24 0231 06/24/24 0248  WBC 12.3* 8.7 8.3 4.7 3.9*  NEUTROABS 10.6*  --  6.9 2.8  --   HGB 12.5 10.1* 10.0* 10.0* 9.7*  HCT 38.3 31.1* 30.4* 30.5* 29.3*  MCV 86.7 86.4 86.4 85.4 85.4  PLT 440* 371 296 371 425*   BMP &GFR Recent Labs  Lab 06/18/24 0940 06/19/24 0554 06/20/24 0604 06/24/24 0248 06/24/24 0703  NA 135 134* 140 139  --   K 3.4* 2.9* 3.7 2.9*  --   CL 98 98 106 105  --   CO2 26 25 23 24   --   GLUCOSE 158* 89 125* 86  --   BUN 39* 22 12 15   --   CREATININE 1.15* 0.56 0.47 0.53  --   CALCIUM  9.0 8.5* 8.3* 7.9*  --   MG 2.3  --   --   --  1.9   Estimated Creatinine Clearance: 36.6 mL/min (by C-G formula based on SCr of 0.53 mg/dL). Liver & Pancreas: Recent Labs  Lab 06/18/24 0940 06/19/24 0554  AST 39 26  ALT 20 16  ALKPHOS 104 81  BILITOT 1.0 0.8  PROT 7.4 5.9*  ALBUMIN 2.3* 1.9*  No results  for input(s): LIPASE, AMYLASE in the last 168 hours. No results for input(s): AMMONIA in the last 168 hours. Diabetic: No results for input(s): HGBA1C in the last 72 hours. Recent Labs  Lab 06/24/24 1611 06/24/24 1949 06/24/24 2323 06/25/24 0418 06/25/24 0749  GLUCAP 184* 90 83 74 83   Cardiac Enzymes: Recent Labs  Lab 06/23/24 1646  CKTOTAL 16*   No results for input(s): PROBNP in the last 8760 hours. Coagulation Profile: No results for input(s): INR, PROTIME in the last 168 hours. Thyroid  Function Tests: No results for input(s): TSH, T4TOTAL, FREET4, T3FREE, THYROIDAB in the last 72 hours. Lipid Profile: No results for input(s): CHOL, HDL, LDLCALC, TRIG, CHOLHDL, LDLDIRECT in the last 72 hours. Anemia Panel: No results for input(s): VITAMINB12, FOLATE, FERRITIN, TIBC, IRON, RETICCTPCT in the last 72 hours. Urine analysis:    Component Value Date/Time   COLORURINE YELLOW 06/18/2024 0121   APPEARANCEUR HAZY (A) 06/18/2024 0121   LABSPEC >1.046 (H) 06/18/2024 0121   PHURINE 5.0 06/18/2024 0121   GLUCOSEU NEGATIVE 06/18/2024 0121   HGBUR SMALL (A) 06/18/2024 0121   BILIRUBINUR NEGATIVE 06/18/2024 0121   BILIRUBINUR neg 04/06/2023 1443   KETONESUR NEGATIVE 06/18/2024 0121   PROTEINUR NEGATIVE 06/18/2024 0121   UROBILINOGEN negative (A) 04/06/2023 1443   NITRITE NEGATIVE 06/18/2024 0121   LEUKOCYTESUR SMALL (A) 06/18/2024 0121   Sepsis Labs: Invalid input(s): PROCALCITONIN, LACTICIDVEN  Microbiology: Recent Results (from the past 240 hours)  Urine Culture     Status: None   Collection Time: 06/18/24  1:21 AM   Specimen: Urine, Random  Result Value Ref Range Status   Specimen Description URINE, RANDOM  Final   Special Requests URINE, CLEAN CATCH  Final   Culture   Final    NO GROWTH Performed at [facility], [address] [place], [place] [address]    Report Status 06/20/2024 FINAL  Final  Culture,  blood (routine x 2)     Status: None   Collection Time: 06/18/24 10:30 AM   Specimen: BLOOD RIGHT ARM  Result Value Ref Range Status   Specimen Description BLOOD RIGHT ARM  Final   Special Requests   Final    BOTTLES DRAWN AEROBIC AND ANAEROBIC Blood Culture adequate volume   Culture   Final    NO GROWTH 7 DAYS Performed at [facility], [address] [place], [place] [address]    Report Status 06/25/2024 FINAL  Final  Culture, blood (routine x 2)     Status: Abnormal   Collection Time: 06/18/24 10:35 AM   Specimen: BLOOD LEFT ARM  Result Value Ref Range Status   Specimen Description BLOOD LEFT ARM  Final   Special Requests   Final    BOTTLES DRAWN AEROBIC AND ANAEROBIC Blood Culture adequate volume   Culture  Setup Time   Final    GRAM POSITIVE COCCI IN CLUSTERS ANAEROBIC BOTTLE ONLY CRITICAL RESULT CALLED TO, READ BACK BY AND VERIFIED WITH: PHARMD EMILY SINCLAIR ON 06/19/24 @ 1516 BY DRT    Culture (A)  Final    STAPHYLOCOCCUS CAPITIS THE SIGNIFICANCE OF ISOLATING THIS ORGANISM FROM A SINGLE SET OF BLOOD CULTURES WHEN MULTIPLE SETS ARE DRAWN IS UNCERTAIN. PLEASE NOTIFY THE MICROBIOLOGY DEPARTMENT WITHIN ONE WEEK IF SPECIATION AND SENSITIVITIES ARE REQUIRED. Performed at [facility], [address] [place], [place] [address]    Report Status 06/21/2024 FINAL  Final  Blood Culture ID Panel (Reflexed)  Status: Abnormal   Collection Time: 06/18/24 10:35 AM  Result Value Ref Range Status   Enterococcus faecalis NOT DETECTED NOT DETECTED Final   Enterococcus Faecium NOT DETECTED NOT DETECTED Final   Listeria monocytogenes NOT DETECTED NOT DETECTED Final   Staphylococcus species DETECTED (A) NOT DETECTED Final    Comment: CRITICAL RESULT CALLED TO, READ BACK BY AND VERIFIED WITH: PHARMD EMILY SINCLAIR ON 06/19/24 @ 1516 BY DRT    Staphylococcus aureus (BCID) NOT DETECTED NOT DETECTED Final   Staphylococcus epidermidis NOT DETECTED NOT DETECTED Final    Staphylococcus lugdunensis NOT DETECTED NOT DETECTED Final   Streptococcus species NOT DETECTED NOT DETECTED Final   Streptococcus agalactiae NOT DETECTED NOT DETECTED Final   Streptococcus pneumoniae NOT DETECTED NOT DETECTED Final   Streptococcus pyogenes NOT DETECTED NOT DETECTED Final   A.calcoaceticus-baumannii NOT DETECTED NOT DETECTED Final   Bacteroides fragilis NOT DETECTED NOT DETECTED Final   Enterobacterales NOT DETECTED NOT DETECTED Final   Enterobacter cloacae complex NOT DETECTED NOT DETECTED Final   Escherichia coli NOT DETECTED NOT DETECTED Final   Klebsiella aerogenes NOT DETECTED NOT DETECTED Final   Klebsiella oxytoca NOT DETECTED NOT DETECTED Final   Klebsiella pneumoniae NOT DETECTED NOT DETECTED Final   Proteus species NOT DETECTED NOT DETECTED Final   Salmonella species NOT DETECTED NOT DETECTED Final   Serratia marcescens NOT DETECTED NOT DETECTED Final   Haemophilus influenzae NOT DETECTED NOT DETECTED Final   Neisseria meningitidis NOT DETECTED NOT DETECTED Final   Pseudomonas aeruginosa NOT DETECTED NOT DETECTED Final   Stenotrophomonas maltophilia NOT DETECTED NOT DETECTED Final   Candida albicans NOT DETECTED NOT DETECTED Final   Candida auris NOT DETECTED NOT DETECTED Final   Candida glabrata NOT DETECTED NOT DETECTED Final   Candida krusei NOT DETECTED NOT DETECTED Final   Candida parapsilosis NOT DETECTED NOT DETECTED Final   Candida tropicalis NOT DETECTED NOT DETECTED Final   Cryptococcus neoformans/gattii NOT DETECTED NOT DETECTED Final    Comment: Performed at [facility], [address], [place], [place] [address]  MRSA Next Gen by PCR, Nasal     Status: None   Collection Time: 06/18/24  8:47 PM   Specimen: Nasal Mucosa; Nasal Swab  Result Value Ref Range Status   MRSA by PCR Next Gen NOT DETECTED NOT DETECTED Final    Comment: (NOTE) The GeneXpert MRSA Assay (FDA approved for NASAL specimens only), is one component of a comprehensive  MRSA colonization surveillance program. It is not intended to diagnose MRSA infection nor to guide or monitor treatment for MRSA infections. Test performance is not FDA approved in patients less than 63 years old. Performed at [facility], [address] [place], [place] [address]     Radiology Studies: US  EKG SITE RITE Result Date: 06/25/2024 If Site Rite image not attached, placement could not be confirmed due to current cardiac rhythm.     Jaleiyah Alas T. Mosetta [facility]  If 7PM-7AM, please contact night-coverage [email or web address] 06/25/2024, 9:27 AM

## 2024-06-25 NOTE — Progress Notes (Signed)
 Peripherally Inserted Central Catheter Placement  The IV Nurse has discussed with the patient and/or persons authorized to consent for the patient, the purpose of this procedure and the potential benefits and risks involved with this procedure.  The benefits include less needle sticks, lab draws from the catheter, and the patient may be discharged home with the catheter. Risks include, but not limited to, infection, bleeding, blood clot (thrombus formation), and puncture of an artery; nerve damage and irregular heartbeat and possibility to perform a PICC exchange if needed/ordered by physician.  Alternatives to this procedure were also discussed.  Bard Power PICC patient education guide, fact sheet on infection prevention and patient information card has been provided to patient /or left at bedside. Telephone consent  obtained per Blanchie Pinta, daughter   PICC Placement Documentation  PICC Single Lumen 06/25/24 Left Basilic 35 cm 0 cm (Active)  Indication for Insertion or Continuance of Line Prolonged intravenous therapies 06/25/24 1200  Exposed Catheter (cm) 0 cm 06/25/24 1200  Site Assessment Clean, Dry, Intact 06/25/24 1200  Line Status Flushed;Saline locked;Blood return noted 06/25/24 1200  Dressing Type Transparent;Securing device 06/25/24 1200  Dressing Status Antimicrobial disc/dressing in place 06/25/24 1200  Line Care Connections checked and tightened 06/25/24 1200  Line Adjustment (NICU/IV Team Only) No 06/25/24 1200  Dressing Intervention New dressing;Adhesive placed around edges of dressing (IV team/ICU RN only) 06/25/24 1200  Dressing Change Due 07/02/24 06/25/24 1200       Leita  Demeka Sutter 06/25/2024, 12:44 PM

## 2024-06-25 NOTE — Plan of Care (Signed)
 Updated patient's grandson at bedside and patient's daughter over the phone.  Daughter planning to come to the hospital after 12 PM tomorrow, 7/29 to discuss further care plan/options.  She is also planning to meet with Camellia with palliative care on 7/30.

## 2024-06-25 NOTE — TOC Progression Note (Signed)
 Transition of Care [facility]) - Progression Note    Patient Details  Name: Sabrina Hart MRN: [id number] Date of Birth: May 30, 1932  [facility] CM/SW Contact  Roxie KANDICE Stain, RN Phone Number: 06/25/2024, 1:51 PM  Clinical Narrative:    Spoke to patient's daughter, Bisi, regarding transition needs.  Bisi states she will meet with Dr. Kathrin tomorrow and Palliative on Wednesday before making discharge plans.  Pam with [employer] is following patient for home iv abx. PICC line has been inserted.                      Expected Discharge Plan and Services                                               Social Drivers of Health (SDOH) Interventions SDOH Screenings   Food Insecurity: No Food Insecurity (06/18/2024)  Housing: Low Risk  (06/18/2024)  Transportation Needs: No Transportation Needs (06/18/2024)  Utilities: Not At Risk (06/18/2024)  Depression (PHQ2-9): High Risk (03/09/2023)  Social Connections: Unknown (06/18/2024)  Tobacco Use: Low Risk  (06/18/2024)    Readmission Risk Interventions     No data to display

## 2024-06-25 NOTE — Plan of Care (Signed)

## 2024-06-25 NOTE — Progress Notes (Addendum)
 PHARMACY CONSULT NOTE FOR:  OUTPATIENT  PARENTERAL ANTIBIOTIC THERAPY (OPAT)  Indication: Right Hip Abscess  Regimen: Daptomycin  400 mg IV Q 24 hours + Ceftriaxone  2 gm IV Q 24 hours + Metronidazole  500 mg PO BID   End date: 08/05/24  IV antibiotic discharge orders are pended. To discharging provider:  please sign these orders via discharge navigator,  Select New Orders & click on the button choice - Manage This Unsigned Work.     Thank you for allowing pharmacy to be a part of this patient's care.  Damien Quiet, PharmD, BCPS, BCIDP Infectious Diseases Clinical Pharmacist Phone: [phone] 06/25/2024, 9:10 AM

## 2024-06-26 DIAGNOSIS — Z66 Do not resuscitate: Secondary | ICD-10-CM | POA: Diagnosis not present

## 2024-06-26 DIAGNOSIS — Z7189 Other specified counseling: Secondary | ICD-10-CM | POA: Diagnosis not present

## 2024-06-26 DIAGNOSIS — A419 Sepsis, unspecified organism: Secondary | ICD-10-CM | POA: Diagnosis not present

## 2024-06-26 DIAGNOSIS — Z515 Encounter for palliative care: Secondary | ICD-10-CM | POA: Diagnosis not present

## 2024-06-26 LAB — GLUCOSE, CAPILLARY
Glucose-Capillary: 77 mg/dL (ref 70–99)
Glucose-Capillary: 86 mg/dL (ref 70–99)
Glucose-Capillary: 91 mg/dL (ref 70–99)
Glucose-Capillary: 92 mg/dL (ref 70–99)

## 2024-06-26 LAB — CBC
HCT: 29.4 % — ABNORMAL LOW (ref 36.0–46.0)
Hemoglobin: 9.5 g/dL — ABNORMAL LOW (ref 12.0–15.0)
MCH: 28 pg (ref 26.0–34.0)
MCHC: 32.3 g/dL (ref 30.0–36.0)
MCV: 86.7 fL (ref 80.0–100.0)
Platelets: 456 K/uL — ABNORMAL HIGH (ref 150–400)
RBC: 3.39 MIL/uL — ABNORMAL LOW (ref 3.87–5.11)
RDW: 14.2 % (ref 11.5–15.5)
WBC: 5.2 K/uL (ref 4.0–10.5)
nRBC: 0 % (ref 0.0–0.2)

## 2024-06-26 LAB — VITAMIN A: Vitamin A (Retinoic Acid): 19.2 ug/dL — ABNORMAL LOW (ref 22.0–69.5)

## 2024-06-26 MED ORDER — ACETAMINOPHEN 650 MG RE SUPP: 650.0000 mg | Freq: Four times a day (QID) | RECTAL | Status: DC | PRN

## 2024-06-26 MED ORDER — LORAZEPAM 2 MG/ML PO CONC: 0.5000 mg | ORAL | Status: DC | PRN

## 2024-06-26 MED ORDER — GLYCOPYRROLATE 0.2 MG/ML IJ SOLN: 0.2000 mg | INTRAMUSCULAR | Status: DC | PRN

## 2024-06-26 MED ORDER — HALOPERIDOL LACTATE 5 MG/ML IJ SOLN
0.5000 mg | INTRAMUSCULAR | Status: DC | PRN
  Administered 2024-06-30: 0.5 mg via INTRAVENOUS
  Filled 2024-06-26: qty 1

## 2024-06-26 MED ORDER — FENTANYL CITRATE PF 50 MCG/ML IJ SOSY
25.0000 ug | PREFILLED_SYRINGE | INTRAMUSCULAR | Status: DC | PRN
  Administered 2024-06-30 – 2024-07-11 (×6): 25 ug via INTRAVENOUS
  Filled 2024-06-26 (×5): qty 1

## 2024-06-26 MED ORDER — LORAZEPAM 1 MG PO TABS: 1.0000 mg | ORAL_TABLET | ORAL | Status: DC | PRN

## 2024-06-26 MED ORDER — OXYCODONE HCL 5 MG PO TABS: 5.0000 mg | ORAL_TABLET | ORAL | Status: DC | PRN

## 2024-06-26 MED ORDER — ONDANSETRON HCL 4 MG/2ML IJ SOLN: 4.0000 mg | Freq: Four times a day (QID) | INTRAMUSCULAR | Status: DC | PRN

## 2024-06-26 MED ORDER — ACETAMINOPHEN 325 MG PO TABS: 650.0000 mg | ORAL_TABLET | Freq: Four times a day (QID) | ORAL | Status: DC | PRN

## 2024-06-26 MED ORDER — GLYCOPYRROLATE 1 MG PO TABS: 1.0000 mg | ORAL_TABLET | ORAL | Status: DC | PRN

## 2024-06-26 MED ORDER — HALOPERIDOL 1 MG PO TABS
0.5000 mg | ORAL_TABLET | ORAL | Status: DC | PRN
Start: 2024-06-26 — End: 2024-07-11

## 2024-06-26 MED ORDER — POLYVINYL ALCOHOL 1.4 % OP SOLN: 1.0000 [drp] | Freq: Four times a day (QID) | OPHTHALMIC | Status: DC | PRN

## 2024-06-26 MED ORDER — HALOPERIDOL LACTATE 2 MG/ML PO CONC: 0.5000 mg | ORAL | Status: DC | PRN

## 2024-06-26 MED ORDER — ONDANSETRON 4 MG PO TBDP: 4.0000 mg | ORAL_TABLET | Freq: Four times a day (QID) | ORAL | Status: DC | PRN

## 2024-06-26 MED ORDER — LORAZEPAM 2 MG/ML PO CONC
1.0000 mg | ORAL | Status: DC | PRN
  Administered 2024-06-27 – 2024-07-05 (×4): 1 mg via SUBLINGUAL
  Filled 2024-06-26 (×4): qty 1

## 2024-06-26 NOTE — Progress Notes (Signed)
 PROGRESS NOTE  Sabrina Hart FMW:[id number] DOB: Oct 13, 1932   PCP: [name], MD  Patient is from: Home.  DOA: 06/18/2024 LOS: 8  Chief complaints Chief Complaint  Patient presents with   Wound Check     Brief Narrative / Interim history: 89 year old F with PMH of dementia, CAD/stent, HTN, HLD, hypothyroidism, glaucoma, DVT on Eliquis  and chronic bilateral gluteal decubitus and right foot ulcer presenting with increased CPH/bloody drainage after starting Eliquis  for DVT, decline in mentation and activity 4 weeks, and admitted with severe sepsis due to infected decubitus ulcer, right hip abscess and osteomyelitis of first metatarsal head of the right foot.    CT abdomen pelvis showed crescentic abscess at the right hip measuring 12 x 2 x 12 cm, prominent stool favoring constipation, mesenteric edema, severe arthropathy of the hips possible synovitis in the right hip.   Patient received vancomycin , aztreonam  in the ED in the setting of penicillin allergy. Was also diagnosed with right foot abscess and osteomyelitis.  Orthopedic and general surgery consulted, and did not feel there is indication for I&D and recommended wound care and palliative care consult.  After meeting with palliative care, family decided to continue current scope of care with antibiotics.  ID consulted on 7/26, and escalated antibiotics to daptomycin , ceftriaxone  and Flagyl .  After goal of care discussion with patient's daughter, transition to full comfort care on 7/29.  TOC consulted for residential hospice placement.  Subjective: Seen and examined earlier this morning and this afternoon.  Patient remains sleepy and minimally interactive.  Poor p.o. intake.  Extensive goals of care discussion with patient's daughter and personal caregiver at bedside.  We have discussed about poor wound healing due to immobility and low nutrition.  There is also no guarantee that the infection/osteomyelitis will clear just with  antibiotics.  Patient's daughter states that her mother had expressed his desire to go peacefully and be with God in the past.  Daughter and caregiver state that they were prepared.  We have discussed about hospice at home and full comfort care.  Family in agreement with full comfort care.  Have discussed the details of full comfort care including use of medications for symptom management.  We have also discussed about residential hospice and hospice at home.  Patient's daughter is interested in residential hospice.  TOC consulted for hospice referral.  Family okay with discontinuing antibiotics.   Objective: Vitals:   06/26/24 0000 06/26/24 0434 06/26/24 0900 06/26/24 1131  BP: (!) 136/50 (!) 118/47 (!) 139/54 (!) 121/46  Pulse: 71 60 78 93  Resp: 16 16  17   Temp: 97.9 F (36.6 C) 98.4 F (36.9 C) 98.7 F (37.1 C) 97.8 F (36.6 C)  TempSrc: Axillary Axillary    SpO2: 100% 100% 100% 100%  Weight:      Height:        Examination:  GENERAL: No apparent distress.  Appears frail. HEENT: MMM.  Vision and hearing grossly intact.  NECK: Supple.  No apparent JVD.  RESP:  No IWOB.  Fair aeration bilaterally. CVS:  RRR. Heart sounds normal.  ABD/GI/GU: BS+. Abd soft, NTND.  MSK/EXT: Seems to have some contractures.  Bilateral mittens.  Prevalon boots NEURO: Sleepy but wakes to voice.  Mumbles some words.  No facial symmetry.  Limited exam due to mental status. PSYCH: Calm.  No distress or agitation.  Consultants:  Orthopedic surgery General surgery Palliative medicine Infectious disease Interventional radiology  Procedures: None  Microbiology summarized: MRSA PCR screen nonreactive  Blood culture with Staph capitis in 1 out of 2 bottles  Assessment and plan: End-of-life care-transitioned to full comfort care on 7/29 after discussion with patient's daughter at bedside. - Comfort pathway with as needed medications-ordered. - Family interested in residential hospice.  TOC consulted  for referral.  Severe sepsis due to infected decubitus ulcers, right hip abscess and right foot abscess and osteomyelitis: Present on admission.  Had tachypnea and leukocytosis and lactic acid of 3.7. -CT abdomen and pelvis as above. -MRI of right foot ulceration medial to the first MTP joint with underlying abscess and osteomyelitis of the first metatarsal head, its sesmoids and the base of first proximal phalanx -Evaluated by Dr. Stan to be not a candidate for endovascular evaluation due to her flexor contracture -Ortho and surgery did not feel I&D is indicated.  IR concurred with surgery and Ortho. -Surgery recommended hydrotherapy, WTD, air mattress and frequent turning and offloading. -Family expressed desire to continue current scope of care with antibiotics.  No tube feed. -Was Duricef and doxy.  ID consulted on 7/26 and escalated to daptomycin , CTX and Flagyl  -PICC line placed on 7/28 -Transitioned to full comfort care on 7/29.   Acute septic encephalopathy, POA: sleepy but wakes to voice.  Only mumbles words. Dementia without behavioral disturbance  Positive blood culture: Blood culture with Staphylococcus capitis in 1 out of 2 bottles likely contaminant   Right kidney lesion: Favoring a cyst but is indeterminant -MRI recommended for follow-up but doubt utility given patient's age and poor prognosis   Hypoglycemia: 47 initially, required dextrose  fluid.  Now on diet, sugar controlled.   Constipation: MiraLAX  and Fleet enema and Colace ordered.  Constipation resolved.   Hypertension normotensive off home antihypertensive meds.  AKI: Likely ATN due to severe sepsis.  Resolved.   Hypokalemia   Mild hyponatremia: Resolved.  Normocytic anemia: Stable.   Hyperlipidemia   CAD: Status post stents   Hypothyroidism - Continue home Synthroid    Glaucoma   History of DVT  Increased nutrient needs Body mass index is 19.37 kg/m. Nutrition Problem: Increased nutrient  needs Etiology: wound healing Signs/Symptoms: estimated needs Interventions: Ensure Enlive (each supplement provides 350kcal and 20 grams of protein), MVI, Prostat   DVT prophylaxis:  Patient is full comfort care  Code Status: DNR-comfort Family Communication: Updated patient's daughter at bedside. Level of care: Telemetry Medical Status is: Inpatient Remains inpatient appropriate because: End-of-life care   Final disposition: Residential hospice.   55 minutes with more than 50% spent in reviewing records, counseling patient/family and coordinating care.   Sch Meds:  Scheduled Meds:  ascorbic acid   500 mg Oral BID   Chlorhexidine  Gluconate Cloth  6 each Topical Daily   collagenase    Topical Daily   feeding supplement  237 mL Oral TID BM   levothyroxine   88 mcg Oral Daily   Continuous Infusions:   PRN Meds:.acetaminophen  **OR** acetaminophen , artificial tears, fentaNYL  (SUBLIMAZE ) injection, glycopyrrolate  **OR** glycopyrrolate  **OR** glycopyrrolate , haloperidol  **OR** haloperidol  **OR** haloperidol  lactate, LORazepam  **OR** LORazepam  **OR** LORazepam , ondansetron  **OR** ondansetron  (ZOFRAN ) IV  Antimicrobials: Anti-infectives (From admission, onward)    Start     Dose/Rate Route Frequency Ordered Stop   06/23/24 2200  metroNIDAZOLE  (FLAGYL ) tablet 500 mg  Status:  Discontinued        500 mg Oral Every 12 hours 06/23/24 1757 06/26/24 1542   06/23/24 2000  cefTRIAXone  (ROCEPHIN ) 2 g in sodium chloride  0.9 % 100 mL IVPB  Status:  Discontinued  2 g 200 mL/hr over 30 Minutes Intravenous Every 24 hours 06/23/24 1429 06/26/24 1542   06/23/24 1515  DAPTOmycin  (CUBICIN ) IVPB 500 mg/49mL premix  Status:  Discontinued        500 mg 100 mL/hr over 30 Minutes Intravenous Daily 06/23/24 1429 06/23/24 1502   06/23/24 1515  DAPTOmycin  (CUBICIN ) 400 mg in sodium chloride  0.9 % IVPB  Status:  Discontinued        400 mg 116 mL/hr over 30 Minutes Intravenous Daily 06/23/24 1502  06/26/24 1542   06/22/24 1000  doxycycline  (VIBRA -TABS) tablet 100 mg  Status:  Discontinued        100 mg Oral Every 12 hours 06/20/24 0937 06/23/24 1429   06/20/24 1200  vancomycin  (VANCOCIN ) IVPB 1000 mg/200 mL premix  Status:  Discontinued        1,000 mg 200 mL/hr over 60 Minutes Intravenous Every 48 hours 06/18/24 1432 06/19/24 1540   06/20/24 1030  cefadroxil  (DURICEF) capsule 500 mg  Status:  Discontinued        500 mg Oral 2 times daily 06/20/24 0937 06/23/24 1429   06/20/24 0600  vancomycin  (VANCOREADY) IVPB 1250 mg/250 mL  Status:  Discontinued        1,250 mg 166.7 mL/hr over 90 Minutes Intravenous Every 48 hours 06/19/24 1540 06/20/24 0944   06/19/24 1600  cefTRIAXone  (ROCEPHIN ) 2 g in sodium chloride  0.9 % 100 mL IVPB  Status:  Discontinued        2 g 200 mL/hr over 30 Minutes Intravenous Every 24 hours 06/19/24 1417 06/20/24 0944   06/18/24 2000  aztreonam  (AZACTAM ) 1 g in sodium chloride  0.9 % 100 mL IVPB  Status:  Discontinued        1 g 200 mL/hr over 30 Minutes Intravenous Every 8 hours 06/18/24 1432 06/19/24 1417   06/18/24 1015  aztreonam  (AZACTAM ) 2 g in sodium chloride  0.9 % 100 mL IVPB        2 g 200 mL/hr over 30 Minutes Intravenous  Once 06/18/24 1008 06/18/24 1126   06/18/24 1015  vancomycin  (VANCOCIN ) IVPB 1000 mg/200 mL premix  Status:  Discontinued        1,000 mg 200 mL/hr over 60 Minutes Intravenous  Once 06/18/24 1008 06/18/24 1014   06/18/24 1015  vancomycin  (VANCOREADY) IVPB 1250 mg/250 mL        1,250 mg 166.7 mL/hr over 90 Minutes Intravenous  Once 06/18/24 1014 06/18/24 1321        I have personally reviewed the following labs and images: CBC: Recent Labs  Lab 06/20/24 0604 06/23/24 0231 06/24/24 0248 06/25/24 0902 06/26/24 0306  WBC 8.3 4.7 3.9* 6.5 5.2  NEUTROABS 6.9 2.8  --   --   --   HGB 10.0* 10.0* 9.7* 10.5* 9.5*  HCT 30.4* 30.5* 29.3* 32.6* 29.4*  MCV 86.4 85.4 85.4 86.2 86.7  PLT 296 371 425* 498* 456*   BMP &GFR Recent  Labs  Lab 06/20/24 0604 06/24/24 0248 06/24/24 0703 06/25/24 0902  NA 140 139  --  139  K 3.7 2.9*  --  3.7  CL 106 105  --  107  CO2 23 24  --  22  GLUCOSE 125* 86  --  79  BUN 12 15  --  10  CREATININE 0.47 0.53  --  0.55  CALCIUM  8.3* 7.9*  --  7.9*  MG  --   --  1.9 1.9  PHOS  --   --   --  2.6   Estimated Creatinine Clearance: 36.6 mL/min (by C-G formula based on SCr of 0.55 mg/dL). Liver & Pancreas: Recent Labs  Lab 06/25/24 0902  ALBUMIN 1.9*   No results for input(s): LIPASE, AMYLASE in the last 168 hours. No results for input(s): AMMONIA in the last 168 hours. Diabetic: No results for input(s): HGBA1C in the last 72 hours. Recent Labs  Lab 06/25/24 2158 06/26/24 0028 06/26/24 0408 06/26/24 0859 06/26/24 1136  GLUCAP 115* 91 77 86 92   Cardiac Enzymes: Recent Labs  Lab 06/23/24 1646  CKTOTAL 16*   No results for input(s): PROBNP in the last 8760 hours. Coagulation Profile: No results for input(s): INR, PROTIME in the last 168 hours. Thyroid  Function Tests: No results for input(s): TSH, T4TOTAL, FREET4, T3FREE, THYROIDAB in the last 72 hours. Lipid Profile: No results for input(s): CHOL, HDL, LDLCALC, TRIG, CHOLHDL, LDLDIRECT in the last 72 hours. Anemia Panel: No results for input(s): VITAMINB12, FOLATE, FERRITIN, TIBC, IRON, RETICCTPCT in the last 72 hours. Urine analysis:    Component Value Date/Time   COLORURINE YELLOW 06/18/2024 0121   APPEARANCEUR HAZY (A) 06/18/2024 0121   LABSPEC >1.046 (H) 06/18/2024 0121   PHURINE 5.0 06/18/2024 0121   GLUCOSEU NEGATIVE 06/18/2024 0121   HGBUR SMALL (A) 06/18/2024 0121   BILIRUBINUR NEGATIVE 06/18/2024 0121   BILIRUBINUR neg 04/06/2023 1443   KETONESUR NEGATIVE 06/18/2024 0121   PROTEINUR NEGATIVE 06/18/2024 0121   UROBILINOGEN negative (A) 04/06/2023 1443   NITRITE NEGATIVE 06/18/2024 0121   LEUKOCYTESUR SMALL (A) 06/18/2024 0121   Sepsis  Labs: Invalid input(s): PROCALCITONIN, LACTICIDVEN  Microbiology: Recent Results (from the past 240 hours)  Urine Culture     Status: None   Collection Time: 06/18/24  1:21 AM   Specimen: Urine, Random  Result Value Ref Range Status   Specimen Description URINE, RANDOM  Final   Special Requests URINE, CLEAN CATCH  Final   Culture   Final    NO GROWTH Performed at [facility], [address], [place], [place] [address]    Report Status 06/20/2024 FINAL  Final  Culture, blood (routine x 2)     Status: None   Collection Time: 06/18/24 10:30 AM   Specimen: BLOOD RIGHT ARM  Result Value Ref Range Status   Specimen Description BLOOD RIGHT ARM  Final   Special Requests   Final    BOTTLES DRAWN AEROBIC AND ANAEROBIC Blood Culture adequate volume   Culture   Final    NO GROWTH 7 DAYS Performed at [facility], [address] [place], [place] [address]    Report Status 06/25/2024 FINAL  Final  Culture, blood (routine x 2)     Status: Abnormal   Collection Time: 06/18/24 10:35 AM   Specimen: BLOOD LEFT ARM  Result Value Ref Range Status   Specimen Description BLOOD LEFT ARM  Final   Special Requests   Final    BOTTLES DRAWN AEROBIC AND ANAEROBIC Blood Culture adequate volume   Culture  Setup Time   Final    GRAM POSITIVE COCCI IN CLUSTERS ANAEROBIC BOTTLE ONLY CRITICAL RESULT CALLED TO, READ BACK BY AND VERIFIED WITH: PHARMD EMILY SINCLAIR ON 06/19/24 @ 1516 BY DRT    Culture (A)  Final    STAPHYLOCOCCUS CAPITIS THE SIGNIFICANCE OF ISOLATING THIS ORGANISM FROM A SINGLE SET OF BLOOD CULTURES WHEN MULTIPLE SETS ARE DRAWN IS UNCERTAIN. PLEASE NOTIFY THE MICROBIOLOGY DEPARTMENT WITHIN ONE WEEK IF SPECIATION AND SENSITIVITIES ARE REQUIRED. Performed at  [facility], [address], [place], [place] [address]    Report Status 06/21/2024 FINAL  Final  Blood Culture ID Panel (Reflexed)     Status: Abnormal   Collection Time: 06/18/24 10:35 AM  Result Value Ref  Range Status   Enterococcus faecalis NOT DETECTED NOT DETECTED Final   Enterococcus Faecium NOT DETECTED NOT DETECTED Final   Listeria monocytogenes NOT DETECTED NOT DETECTED Final   Staphylococcus species DETECTED (A) NOT DETECTED Final    Comment: CRITICAL RESULT CALLED TO, READ BACK BY AND VERIFIED WITH: PHARMD EMILY SINCLAIR ON 06/19/24 @ 1516 BY DRT    Staphylococcus aureus (BCID) NOT DETECTED NOT DETECTED Final   Staphylococcus epidermidis NOT DETECTED NOT DETECTED Final   Staphylococcus lugdunensis NOT DETECTED NOT DETECTED Final   Streptococcus species NOT DETECTED NOT DETECTED Final   Streptococcus agalactiae NOT DETECTED NOT DETECTED Final   Streptococcus pneumoniae NOT DETECTED NOT DETECTED Final   Streptococcus pyogenes NOT DETECTED NOT DETECTED Final   A.calcoaceticus-baumannii NOT DETECTED NOT DETECTED Final   Bacteroides fragilis NOT DETECTED NOT DETECTED Final   Enterobacterales NOT DETECTED NOT DETECTED Final   Enterobacter cloacae complex NOT DETECTED NOT DETECTED Final   Escherichia coli NOT DETECTED NOT DETECTED Final   Klebsiella aerogenes NOT DETECTED NOT DETECTED Final   Klebsiella oxytoca NOT DETECTED NOT DETECTED Final   Klebsiella pneumoniae NOT DETECTED NOT DETECTED Final   Proteus species NOT DETECTED NOT DETECTED Final   Salmonella species NOT DETECTED NOT DETECTED Final   Serratia marcescens NOT DETECTED NOT DETECTED Final   Haemophilus influenzae NOT DETECTED NOT DETECTED Final   Neisseria meningitidis NOT DETECTED NOT DETECTED Final   Pseudomonas aeruginosa NOT DETECTED NOT DETECTED Final   Stenotrophomonas maltophilia NOT DETECTED NOT DETECTED Final   Candida albicans NOT DETECTED NOT DETECTED Final   Candida auris NOT DETECTED NOT DETECTED Final   Candida glabrata NOT DETECTED NOT DETECTED Final   Candida krusei NOT DETECTED NOT DETECTED Final   Candida parapsilosis NOT DETECTED NOT DETECTED Final   Candida tropicalis NOT DETECTED NOT DETECTED Final    Cryptococcus neoformans/gattii NOT DETECTED NOT DETECTED Final    Comment: Performed at [facility], [address], [place], [place] [address]  MRSA Next Gen by PCR, Nasal     Status: None   Collection Time: 06/18/24  8:47 PM   Specimen: Nasal Mucosa; Nasal Swab  Result Value Ref Range Status   MRSA by PCR Next Gen NOT DETECTED NOT DETECTED Final    Comment: (NOTE) The GeneXpert MRSA Assay (FDA approved for NASAL specimens only), is one component of a comprehensive MRSA colonization surveillance program. It is not intended to diagnose MRSA infection nor to guide or monitor treatment for MRSA infections. Test performance is not FDA approved in patients less than 43 years old. Performed at [facility], [address] [place], [place] [address]     Radiology Studies: No results found.     Sheila Ocasio T. Natallie Ravenscroft [facility]  If 7PM-7AM, please contact night-coverage [email or web address] 06/26/2024, 3:44 PM

## 2024-06-26 NOTE — Plan of Care (Signed)

## 2024-06-26 NOTE — TOC Progression Note (Signed)
 Transition of Care [facility]) - Progression Note    Patient Details  Name: Sabrina Hart MRN: [id number] Date of Birth: 1932-05-21  Transition [facility] Care [facility]) CM/SW Contact  Rosaline JONELLE Joe, RN Phone Number: 06/26/2024, 3:46 PM  Clinical Narrative:    CM spoke with Dr. Kathrin, attending and he met with the patient's daughter, granddaughter and grandson at the bedside and the family would like referral placed for Inpatient hospice.  Patient's family was provided with medicare choice regarding hospice provider and the family chose [facility].    I sent a message to Eleanor Nail, CM with [facility] and placed referral.  Patient was living at home with the family prior to admission to the hospital.  DME at the home includes [facility] wheelchair.  CM with IP Care management team will continue to follow the patient for likely inpatient hospice placement.  I called Holley Herring, RNCM and up dated her that IV antibiotics will not be needed for home considering the patient's family requests hospice.                     Expected Discharge Plan and Services                                               Social Drivers of Health (SDOH) Interventions SDOH Screenings   Food Insecurity: No Food Insecurity (06/18/2024)  Housing: Low Risk  (06/18/2024)  Transportation Needs: No Transportation Needs (06/18/2024)  Utilities: Not At Risk (06/18/2024)  Depression (PHQ2-9): High Risk (03/09/2023)  Social Connections: Unknown (06/18/2024)  Tobacco Use: Low Risk  (06/18/2024)    Readmission Risk Interventions    06/26/2024    3:45 PM  Readmission Risk Prevention Plan  Transportation Screening Complete  PCP or Specialist Appt within 5-7 Days Complete  Home Care Screening Complete  Medication Review (RN CM) Complete

## 2024-06-26 NOTE — Progress Notes (Signed)
 SLP Cancellation Note  Patient Details Name: Sabrina Hart MRN: [id number] DOB: 1932-08-01   Cancelled treatment:       Reason Eval/Treat Not Completed: Fatigue/lethargy limiting ability to participate. Pt poorly responsive at this time - not alert enough to complete swallow assessment. Daughter at bedside reports pt tolerated soft/chopped solids/thin liquids prior to admit. She is missing molars. Holding PO/meds during this admit. Will continue efforts for BSE when pt more alert/participatory. RN/family aware.  Aleiya Rye B. Dory, MSP, CCC-SLP Speech Language Pathologist Office: [phone]  Dory Caprice Daring 06/26/2024, 2:27 PM

## 2024-06-26 NOTE — Progress Notes (Signed)
 Physical Therapy Wound Treatment Patient Details  Name: Sabrina Hart MRN: [id number] Date of Birth: 02/20/1932  Today's Date: 06/26/2024 Time: 8582-8496 Time Calculation (min): 46 min  Subjective  Subjective Assessment Subjective: Pt nonverbal throughout session. Patient and Family Stated Goals: did not state Date of Onset:  (unknown) Prior Treatments: dressing changes  Pain Score:   No signs of pain throughout session  Wound Assessment  Wound 06/18/24 1940 Pressure Injury Ischial tuberosity Anterior;Proximal;Right Unstageable - Full thickness tissue loss in which the base of the injury is covered by slough (yellow, tan, gray, green or brown) and/or eschar (tan, brown or black) in the wo (Active)  Wound Image   06/26/24 1617  Site / Wound Assessment Yellow;Black;Clean 06/26/24 1617  Peri-wound Assessment Intact;Pink 06/26/24 1617  Wound Length (cm) 10.9 cm 06/26/24 1617  Wound Width (cm) 9 cm 06/26/24 1617  Wound Surface Area (cm^2) 77.05 cm^2 06/26/24 1617  Wound Depth (cm) 0.8 cm 06/26/24 1617  Wound Volume (cm^3) 41.092 cm^3 06/26/24 1617  Drainage Description Purulent 06/26/24 1617  Drainage Amount Small 06/26/24 1617  Treatment Debridement (Selective);Irrigation 06/26/24 1617  Dressing Type Foam - Lift dressing to assess site every shift;Gauze (Comment);ABD;Moist to dry;Santyl ;Barrier Film (skin prep);Other (Comment) 06/26/24 1617  Dressing Changed Changed 06/26/24 1617  Dressing Status Clean, Dry, Intact 06/26/24 1617  State of Healing Eschar 06/26/24 1617  % Wound base Red or Granulating 20% 06/26/24 1617  % Wound base Yellow/Fibrinous Exudate 30% 06/26/24 1617  % Wound base Black/Eschar 0% 06/26/24 1617  % Wound base Other/Granulation Tissue (Comment) 50% 06/26/24 1617  Tunneling (cm) 0 06/22/24 1300  Undermining (cm) 1:00 4.0cm; 4:00 4.1 cm; 9:00 5.3 cm (deepest areas) 06/26/24 1617  Margins Unattached edges (unapproximated) 06/26/24 1617  Non-staged Wound  Description Full thickness 06/23/24 0720      Selective Debridement (non-excisional) Selective Debridement (non-excisional) - Location: R ischial tuberosity Selective Debridement (non-excisional) - Tools Used: Forceps, Scalpel Selective Debridement (non-excisional) - Tissue Removed: Black eschar, yellow and black slough, necrotic adipose tissue    Wound Assessment and Plan  Wound Therapy - Assess/Plan/Recommendations Wound Therapy - Clinical Statement: Pt presents with an unstageable wound over Rt greater trochanter. Wound is covered primarily by black leathery muscles with visible striations.  The remaining black eschar at the wound edges was removed. Anticipate may soon be ready for discharge from hydrotherapy. Wound Therapy - Functional Problem List: decreased mobility; decreased cognition Factors Delaying/Impairing Wound Healing: Incontinence, Immobility, Multiple medical problems Hydrotherapy Plan: Debridement, Dressing change, Patient/family education Wound Therapy - Frequency: 2X / week Wound Therapy - Current Recommendations: Case manager/social work, PT Wound Therapy - Follow Up Recommendations: dressing changes by family/patient  Wound Therapy Goals- Improve the function of patient's integumentary system by progressing the wound(s) through the phases of wound healing (inflammation - proliferation - remodeling) by: Wound Therapy Goals - Improve the function of patient's integumentary system by progressing the wound(s) through the phases of wound healing by: Decrease Necrotic Tissue to: 10% Decrease Necrotic Tissue - Progress: Progressing toward goal Increase Granulation Tissue to: 90% Increase Granulation Tissue - Progress: Progressing toward goal Improve Drainage Characteristics: Serous, Min Improve Drainage Characteristics - Progress: Progressing toward goal Patient/Family will be able to : change the wound dressing independently Patient/Family Instruction Goal - Progress: Not  progressing Goals/treatment plan/discharge plan were made with and agreed upon by patient/family: No, Patient unable to participate in goals/treatment/discharge plan and family unavailable Time For Goal Achievement: 7 days Wound Therapy - Potential for Goals: Good  Goals  will be updated until maximal potential achieved or discharge criteria met.  Discharge criteria: when goals achieved, discharge from hospital, MD decision/surgical intervention, no progress towards goals, refusal/missing three consecutive treatments without notification or medical reason.  GP     Charges PT Wound Care Charges $Wound Debridement up to 20 cm: < or equal to 20 cm $ Wound Debridement each add'l 20 sqcm: 2 $PT Hydrotherapy Visit: 1 Visit      Macario RAMAN, PT Acute Rehabilitation Services  Office [phone]   Macario SHAUNNA Soja 06/26/2024, 4:33 PM

## 2024-06-27 DIAGNOSIS — Z7189 Other specified counseling: Secondary | ICD-10-CM | POA: Diagnosis not present

## 2024-06-27 DIAGNOSIS — A419 Sepsis, unspecified organism: Secondary | ICD-10-CM | POA: Diagnosis not present

## 2024-06-27 DIAGNOSIS — R6510 Systemic inflammatory response syndrome (SIRS) of non-infectious origin without acute organ dysfunction: Secondary | ICD-10-CM | POA: Diagnosis not present

## 2024-06-27 DIAGNOSIS — Z515 Encounter for palliative care: Secondary | ICD-10-CM | POA: Diagnosis not present

## 2024-06-27 LAB — GLUCOSE, CAPILLARY
Glucose-Capillary: 58 mg/dL — ABNORMAL LOW (ref 70–99)
Glucose-Capillary: 50 mg/dL — ABNORMAL LOW (ref 70–99)
Glucose-Capillary: 94 mg/dL (ref 70–99)

## 2024-06-27 MED ORDER — HYDRALAZINE HCL 20 MG/ML IJ SOLN: 10.0000 mg | INTRAMUSCULAR | Status: DC | PRN

## 2024-06-27 MED ORDER — METOPROLOL TARTRATE 5 MG/5ML IV SOLN: 5.0000 mg | INTRAVENOUS | Status: DC | PRN

## 2024-06-27 MED ORDER — IPRATROPIUM-ALBUTEROL 0.5-2.5 (3) MG/3ML IN SOLN: 3.0000 mL | RESPIRATORY_TRACT | Status: DC | PRN

## 2024-06-27 MED ORDER — DEXTROSE 50 % IV SOLN
12.5000 g | INTRAVENOUS | Status: AC
  Administered 2024-06-27: 12.5 g via INTRAVENOUS
  Filled 2024-06-27: qty 50

## 2024-06-27 NOTE — Plan of Care (Signed)
   Problem: Education: Goal: Knowledge of General Education information will improve Description: Including pain rating scale, medication(s)/side effects and non-pharmacologic comfort measures Outcome: Not Progressing   Problem: Health Behavior/Discharge Planning: Goal: Ability to manage health-related needs will improve Outcome: Not Progressing

## 2024-06-27 NOTE — Hospital Course (Addendum)
 Brief Narrative:  89 year old F with PMH of dementia, CAD/stent, HTN, HLD, hypothyroidism, glaucoma, DVT on Eliquis  and chronic bilateral gluteal decubitus and right foot ulcer presenting with increased CPH/bloody drainage after starting Eliquis  for DVT, decline in mentation and activity 4 weeks, and admitted with severe sepsis due to infected decubitus ulcer, right hip abscess and osteomyelitis of first metatarsal head of the right foot.    CT abdomen pelvis showed crescentic abscess at the right hip measuring 12 x 2 x 12 cm, prominent stool favoring constipation, mesenteric edema, severe arthropathy of the hips possible synovitis in the right hip.    Patient received vancomycin , aztreonam  in the ED in the setting of penicillin allergy. Was also diagnosed with right foot abscess and osteomyelitis.  Orthopedic and general surgery consulted, and did not feel there is indication for I&D and recommended wound care and palliative care consult.  After meeting with palliative care, family decided to continue current scope of care with antibiotics.  ID consulted on 7/26, and escalated antibiotics to daptomycin , ceftriaxone  and Flagyl .   After goal of care discussion with patient's daughter, transition to full comfort care on 7/29.  TOC consulted for residential hospice placement.  At first daughter agreeable to be to place but later on 7/30 when she changed her mind.   Assessment & Plan:  Principal Problem:   SIRS (systemic inflammatory response syndrome) (HCC) Active Problems:   CAD (coronary artery disease)   Hypothyroidism   Hyperlipidemia   Severe late onset Alzheimer's dementia (HCC)   Essential hypertension   Primary open angle glaucoma (POAG) of both eyes   Pressure injury of right hip, unstageable (HCC)   Gangrene of right foot (HCC)   Pressure injury of skin    End-of-life care-transitioned to full comfort care on 7/29 after discussion with patient's daughter at bedside. - Comfort  pathway with as needed medications-ordered. - Family interested in residential hospice.  TOC consulted for referral.   Severe sepsis due to infected decubitus ulcers, right hip abscess and right foot abscess and osteomyelitis: Present on admission.  Had tachypnea and leukocytosis and lactic acid of 3.7. -CT abdomen and pelvis as above. -MRI of right foot ulceration medial to the first MTP joint with underlying abscess and osteomyelitis of the first metatarsal head, its sesmoids and the base of first proximal phalanx -Evaluated by Dr. Stan to be not a candidate for endovascular evaluation due to her flexor contracture -Ortho and surgery did not feel I&D is indicated.  IR concurred with surgery and Ortho. -Surgery recommended hydrotherapy, WTD, air mattress and frequent turning and offloading. -Family expressed desire to continue current scope of care with antibiotics.  No tube feed. -Was Duricef and doxy.  ID consulted on 7/26 and escalated to daptomycin , CTX and Flagyl  -PICC line placed on 7/28 -Transitioned to full comfort care on 7/29.   Acute septic encephalopathy, POA: sleepy but wakes to voice.  Only mumbles words. Dementia without behavioral disturbance   Positive blood culture: Blood culture with Staphylococcus capitis in 1 out of 2 bottles likely contaminant   Right kidney lesion: Favoring a cyst but is indeterminant -MRI recommended for follow-up but doubt utility given patient's age and poor prognosis   Hypoglycemia: 47 initially, required dextrose  fluid.  Now on diet, sugar controlled.   Constipation: MiraLAX  and Fleet enema and Colace ordered.  Constipation resolved.   Hypertension normotensive off home antihypertensive meds.   AKI: Likely ATN due to severe sepsis.  Resolved.   Hypokalemia   Mild  hyponatremia: Resolved.   Normocytic anemia: Stable.   Hyperlipidemia   CAD: Status post stents   Hypothyroidism Discontinue home medications   Glaucoma    History of DVT   Increased nutrient needs Body mass index is 19.37 kg/m. Nutrition Problem: Increased nutrient needs Etiology: wound healing Signs/Symptoms: estimated needs Interventions: Ensure Enlive (each supplement provides 350kcal and 20 grams of protein), MVI, Prostat   Consultants:  Orthopedic surgery General surgery Palliative medicine Infectious disease Interventional radiology   Procedures: None   Microbiology summarized: MRSA PCR screen nonreactive Blood culture with Staph capitis in 1 out of 2 bottles   DVT prophylaxis: Patient is full comfort care   Code Status: DNR-comfort Family Communication: Attempted to call daughter but no response Status is: Inpatient Remains inpatient appropriate because: End-of-life care; safe dispo pending      Subjective: Minimal interaction, no complaints.   Examination:  General exam: Elderly frail cachectic Respiratory system: Clear to auscultation. Respiratory effort normal. Cardiovascular system: S1 & S2 heard, RRR. No JVD, murmurs, rubs, gallops or clicks. No pedal edema. Gastrointestinal system: Abdomen is nondistended, soft and nontender. No organomegaly or masses felt. Normal bowel sounds heard. Central nervous system: Sleepy, occasionally mumbles some words Extremities: Symmetric 4 x 5 power. Skin: No rashes, lesions or ulcers Psychiatry: No agitation

## 2024-06-27 NOTE — Progress Notes (Signed)
 PROGRESS NOTE    Sabrina Hart  FMW:[id number] DOB: 1932/09/30 DOA: 06/18/2024 PCP: [name], MD    Brief Narrative:  89 year old F with PMH of dementia, CAD/stent, HTN, HLD, hypothyroidism, glaucoma, DVT on Eliquis  and chronic bilateral gluteal decubitus and right foot ulcer presenting with increased CPH/bloody drainage after starting Eliquis  for DVT, decline in mentation and activity 4 weeks, and admitted with severe sepsis due to infected decubitus ulcer, right hip abscess and osteomyelitis of first metatarsal head of the right foot.    CT abdomen pelvis showed crescentic abscess at the right hip measuring 12 x 2 x 12 cm, prominent stool favoring constipation, mesenteric edema, severe arthropathy of the hips possible synovitis in the right hip.    Patient received vancomycin , aztreonam  in the ED in the setting of penicillin allergy. Was also diagnosed with right foot abscess and osteomyelitis.  Orthopedic and general surgery consulted, and did not feel there is indication for I&D and recommended wound care and palliative care consult.  After meeting with palliative care, family decided to continue current scope of care with antibiotics.  ID consulted on 7/26, and escalated antibiotics to daptomycin , ceftriaxone  and Flagyl .   After goal of care discussion with patient's daughter, transition to full comfort care on 7/29.  TOC consulted for residential hospice placement.   Assessment & Plan:  Principal Problem:   SIRS (systemic inflammatory response syndrome) (HCC) Active Problems:   CAD (coronary artery disease)   Hypothyroidism   Hyperlipidemia   Severe late onset Alzheimer's dementia (HCC)   Essential hypertension   Primary open angle glaucoma (POAG) of both eyes   Pressure injury of right hip, unstageable (HCC)   Gangrene of right foot (HCC)   Pressure injury of skin    End-of-life care-transitioned to full comfort care on 7/29 after discussion with patient's daughter  at bedside. - Comfort pathway with as needed medications-ordered. - Family interested in residential hospice.  TOC consulted for referral.   Severe sepsis due to infected decubitus ulcers, right hip abscess and right foot abscess and osteomyelitis: Present on admission.  Had tachypnea and leukocytosis and lactic acid of 3.7. -CT abdomen and pelvis as above. -MRI of right foot ulceration medial to the first MTP joint with underlying abscess and osteomyelitis of the first metatarsal head, its sesmoids and the base of first proximal phalanx -Evaluated by Dr. Stan to be not a candidate for endovascular evaluation due to her flexor contracture -Ortho and surgery did not feel I&D is indicated.  IR concurred with surgery and Ortho. -Surgery recommended hydrotherapy, WTD, air mattress and frequent turning and offloading. -Family expressed desire to continue current scope of care with antibiotics.  No tube feed. -Was Duricef and doxy.  ID consulted on 7/26 and escalated to daptomycin , CTX and Flagyl  -PICC line placed on 7/28 -Transitioned to full comfort care on 7/29.   Acute septic encephalopathy, POA: sleepy but wakes to voice.  Only mumbles words. Dementia without behavioral disturbance   Positive blood culture: Blood culture with Staphylococcus capitis in 1 out of 2 bottles likely contaminant   Right kidney lesion: Favoring a cyst but is indeterminant -MRI recommended for follow-up but doubt utility given patient's age and poor prognosis   Hypoglycemia: 47 initially, required dextrose  fluid.  Now on diet, sugar controlled.   Constipation: MiraLAX  and Fleet enema and Colace ordered.  Constipation resolved.   Hypertension normotensive off home antihypertensive meds.   AKI: Likely ATN due to severe sepsis.  Resolved.  Hypokalemia   Mild hyponatremia: Resolved.   Normocytic anemia: Stable.   Hyperlipidemia   CAD: Status post stents   Hypothyroidism - Continue home Synthroid     Glaucoma   History of DVT   Increased nutrient needs Body mass index is 19.37 kg/m. Nutrition Problem: Increased nutrient needs Etiology: wound healing Signs/Symptoms: estimated needs Interventions: Ensure Enlive (each supplement provides 350kcal and 20 grams of protein), MVI, Prostat   Consultants:  Orthopedic surgery General surgery Palliative medicine Infectious disease Interventional radiology   Procedures: None   Microbiology summarized: MRSA PCR screen nonreactive Blood culture with Staph capitis in 1 out of 2 bottles   DVT prophylaxis:  Patient is full comfort care   Code Status: DNR-comfort Family Communication:  Level of care: Telemetry Medical Status is: Inpatient Remains inpatient appropriate because: End-of-life care      Subjective: No complaints, laying in the bed   Examination:  General exam: Appears calm and comfortable  Respiratory system: Clear to auscultation. Respiratory effort normal. Cardiovascular system: S1 & S2 heard, RRR. No JVD, murmurs, rubs, gallops or clicks. No pedal edema. Gastrointestinal system: Abdomen is nondistended, soft and nontender. No organomegaly or masses felt. Normal bowel sounds heard. Central nervous system: Sleepy, occasionally mumbles some words Extremities: Symmetric 4 x 5 power. Skin: No rashes, lesions or ulcers Psychiatry: No agitation                  Objective: Vitals:   06/26/24 0900 06/26/24 1131 06/26/24 2016 06/27/24 0743  BP: (!) 139/54 (!) 121/46 (!) 135/56 (!) 142/109  Pulse: 78 93 77 83  Resp:  17 16 19   Temp: 98.7 F (37.1 C) 97.8 F (36.6 C) 98.6 F (37 C) 98.6 F (37 C)  TempSrc:   Axillary   SpO2: 100% 100% 98% 100%  Weight:      Height:       No intake or output data in the 24 hours ending 06/27/24 1136 Filed Weights   06/18/24 0936 06/18/24 1938  Weight: 52.2 kg 49.6 kg    Scheduled Meds:  ascorbic acid   500 mg Oral BID   Chlorhexidine  Gluconate Cloth  6  each Topical Daily   collagenase    Topical Daily   feeding supplement  237 mL Oral TID BM   levothyroxine   88 mcg Oral Daily   Continuous Infusions:  Nutritional status Signs/Symptoms: estimated needs Interventions: Ensure Enlive (each supplement provides 350kcal and 20 grams of protein), MVI, Prostat Body mass index is 19.37 kg/m.  Data Reviewed:   CBC: Recent Labs  Lab 06/23/24 0231 06/24/24 0248 06/25/24 0902 06/26/24 0306  WBC 4.7 3.9* 6.5 5.2  NEUTROABS 2.8  --   --   --   HGB 10.0* 9.7* 10.5* 9.5*  HCT 30.5* 29.3* 32.6* 29.4*  MCV 85.4 85.4 86.2 86.7  PLT 371 425* 498* 456*   Basic Metabolic Panel: Recent Labs  Lab 06/24/24 0248 06/24/24 0703 06/25/24 0902  NA 139  --  139  K 2.9*  --  3.7  CL 105  --  107  CO2 24  --  22  GLUCOSE 86  --  79  BUN 15  --  10  CREATININE 0.53  --  0.55  CALCIUM  7.9*  --  7.9*  MG  --  1.9 1.9  PHOS  --   --  2.6   GFR: Estimated Creatinine Clearance: 36.6 mL/min (by C-G formula based on SCr of 0.55 mg/dL). Liver Function Tests: Recent Labs  Lab  06/25/24 0902  ALBUMIN 1.9*   No results for input(s): LIPASE, AMYLASE in the last 168 hours. No results for input(s): AMMONIA in the last 168 hours. Coagulation Profile: No results for input(s): INR, PROTIME in the last 168 hours. Cardiac Enzymes: Recent Labs  Lab 06/23/24 1646  CKTOTAL 16*   BNP (last 3 results) No results for input(s): PROBNP in the last 8760 hours. HbA1C: No results for input(s): HGBA1C in the last 72 hours. CBG: Recent Labs  Lab 06/25/24 2158 06/26/24 0028 06/26/24 0408 06/26/24 0859 06/26/24 1136  GLUCAP 115* 91 77 86 92   Lipid Profile: No results for input(s): CHOL, HDL, LDLCALC, TRIG, CHOLHDL, LDLDIRECT in the last 72 hours. Thyroid  Function Tests: No results for input(s): TSH, T4TOTAL, FREET4, T3FREE, THYROIDAB in the last 72 hours. Anemia Panel: No results for input(s): VITAMINB12, FOLATE,  FERRITIN, TIBC, IRON, RETICCTPCT in the last 72 hours. Sepsis Labs: No results for input(s): PROCALCITON, LATICACIDVEN in the last 168 hours.  Recent Results (from the past 240 hours)  Urine Culture     Status: None   Collection Time: 06/18/24  1:21 AM   Specimen: Urine, Random  Result Value Ref Range Status   Specimen Description URINE, RANDOM  Final   Special Requests URINE, CLEAN CATCH  Final   Culture   Final    NO GROWTH Performed at [facility], [address], [place], [place] [address]    Report Status 06/20/2024 FINAL  Final  Culture, blood (routine x 2)     Status: None   Collection Time: 06/18/24 10:30 AM   Specimen: BLOOD RIGHT ARM  Result Value Ref Range Status   Specimen Description BLOOD RIGHT ARM  Final   Special Requests   Final    BOTTLES DRAWN AEROBIC AND ANAEROBIC Blood Culture adequate volume   Culture   Final    NO GROWTH 7 DAYS Performed at [facility], [address] [place], [place] [address]    Report Status 06/25/2024 FINAL  Final  Culture, blood (routine x 2)     Status: Abnormal   Collection Time: 06/18/24 10:35 AM   Specimen: BLOOD LEFT ARM  Result Value Ref Range Status   Specimen Description BLOOD LEFT ARM  Final   Special Requests   Final    BOTTLES DRAWN AEROBIC AND ANAEROBIC Blood Culture adequate volume   Culture  Setup Time   Final    GRAM POSITIVE COCCI IN CLUSTERS ANAEROBIC BOTTLE ONLY CRITICAL RESULT CALLED TO, READ BACK BY AND VERIFIED WITH: PHARMD EMILY SINCLAIR ON 06/19/24 @ 1516 BY DRT    Culture (A)  Final    STAPHYLOCOCCUS CAPITIS THE SIGNIFICANCE OF ISOLATING THIS ORGANISM FROM A SINGLE SET OF BLOOD CULTURES WHEN MULTIPLE SETS ARE DRAWN IS UNCERTAIN. PLEASE NOTIFY THE MICROBIOLOGY DEPARTMENT WITHIN ONE WEEK IF SPECIATION AND SENSITIVITIES ARE REQUIRED. Performed at [facility], [address], [place], [place] [address]    Report Status 06/21/2024 FINAL  Final  Blood Culture ID Panel  (Reflexed)     Status: Abnormal   Collection Time: 06/18/24 10:35 AM  Result Value Ref Range Status   Enterococcus faecalis NOT DETECTED NOT DETECTED Final   Enterococcus Faecium NOT DETECTED NOT DETECTED Final   Listeria monocytogenes NOT DETECTED NOT DETECTED Final   Staphylococcus species DETECTED (A) NOT DETECTED Final    Comment: CRITICAL RESULT CALLED TO, READ BACK BY AND VERIFIED WITH: PHARMD EMILY SINCLAIR ON 06/19/24 @ 1516 BY DRT  Staphylococcus aureus (BCID) NOT DETECTED NOT DETECTED Final   Staphylococcus epidermidis NOT DETECTED NOT DETECTED Final   Staphylococcus lugdunensis NOT DETECTED NOT DETECTED Final   Streptococcus species NOT DETECTED NOT DETECTED Final   Streptococcus agalactiae NOT DETECTED NOT DETECTED Final   Streptococcus pneumoniae NOT DETECTED NOT DETECTED Final   Streptococcus pyogenes NOT DETECTED NOT DETECTED Final   A.calcoaceticus-baumannii NOT DETECTED NOT DETECTED Final   Bacteroides fragilis NOT DETECTED NOT DETECTED Final   Enterobacterales NOT DETECTED NOT DETECTED Final   Enterobacter cloacae complex NOT DETECTED NOT DETECTED Final   Escherichia coli NOT DETECTED NOT DETECTED Final   Klebsiella aerogenes NOT DETECTED NOT DETECTED Final   Klebsiella oxytoca NOT DETECTED NOT DETECTED Final   Klebsiella pneumoniae NOT DETECTED NOT DETECTED Final   Proteus species NOT DETECTED NOT DETECTED Final   Salmonella species NOT DETECTED NOT DETECTED Final   Serratia marcescens NOT DETECTED NOT DETECTED Final   Haemophilus influenzae NOT DETECTED NOT DETECTED Final   Neisseria meningitidis NOT DETECTED NOT DETECTED Final   Pseudomonas aeruginosa NOT DETECTED NOT DETECTED Final   Stenotrophomonas maltophilia NOT DETECTED NOT DETECTED Final   Candida albicans NOT DETECTED NOT DETECTED Final   Candida auris NOT DETECTED NOT DETECTED Final   Candida glabrata NOT DETECTED NOT DETECTED Final   Candida krusei NOT DETECTED NOT DETECTED Final   Candida  parapsilosis NOT DETECTED NOT DETECTED Final   Candida tropicalis NOT DETECTED NOT DETECTED Final   Cryptococcus neoformans/gattii NOT DETECTED NOT DETECTED Final    Comment: Performed at [facility], [address] [place], [place] [address]  MRSA Next Gen by PCR, Nasal     Status: None   Collection Time: 06/18/24  8:47 PM   Specimen: Nasal Mucosa; Nasal Swab  Result Value Ref Range Status   MRSA by PCR Next Gen NOT DETECTED NOT DETECTED Final    Comment: (NOTE) The GeneXpert MRSA Assay (FDA approved for NASAL specimens only), is one component of a comprehensive MRSA colonization surveillance program. It is not intended to diagnose MRSA infection nor to guide or monitor treatment for MRSA infections. Test performance is not FDA approved in patients less than 23 years old. Performed at [facility], [address] [place], [place] [address]          Radiology Studies: No results found.         LOS: 9 days   Time spent= 35 mins    Burgess JAYSON Dare, MD [facility]  If 7PM-7AM, please contact night-coverage  06/27/2024, 11:36 AM

## 2024-06-27 NOTE — Progress Notes (Signed)
 Daily Progress Note   Date: 06/27/2024   Patient Name: Sabrina Hart  DOB: [birth year]  MRN: [id number]  Age / Sex: 89 y.o., female  Attending Physician: Caleen Burgess BROCKS, MD Primary Care Physician: [name], MD Admit Date: 06/18/2024 Length of Stay: 9 days  Reason for Follow-up: Establishing goals of care  Past Medical History:  Diagnosis Date   Anginal pain [facility])    1990's; 09/28/2012   Arthritis    very mild (09/28/2012)   Coronary artery disease 09/27/2012   s/p PCI of the RCA with DES with remote PCI in [place] 15 to 20 years ago. 2. cath 02/2013 patent mid RCA stent, 30% left main, 70% mid to distaal tanden 70% lesions x 3, 60% mid diag, 50% ostial LCx, 40% mid LCx.    Glaucoma    Hyperlipidemia    Hypothyroidism     Subjective:   Subjective: Chart Reviewed. Updates received. Patient Assessed. Created space and opportunity for patient  and family to explore thoughts and feelings regarding current medical situation.  Today's Discussion: Today before meeting with the patient/family, I reviewed the chart including hospitalist notes from the past 3 days, TOC note from yesterday hospitalist note from today.  In short, over the past couple days the patient and her family have transition to comfort care.  No labs due to comfort care status.  I also reviewed vitals, medication administration record, nursing flow sheets.  On review of her chart her vitals are stable with temperature of 98.6, heart rate 83, respiratory rate 19, blood pressure 142/190, satting 100% on room air.  She received 1 dose of Ativan  at 3:00 this morning but no other symptom management needs.  Today I saw the patient at bedside, no family was present.  I observed her for some time and she appears comfortable.  No objective signs of dyspnea, discomfort including no grimacing, moaning, stress movement.  Respirations are even and unlabored, no tachypnea or increased work of breathing.  I called spoke  with the patient's daughter Imagene.  I updated her on current status of her mom.  We discussed her current clinical situation comfort care. I explained comfort care as care where the patient would no longer receive aggressive medical interventions such as continuous vital signs, lab work, radiology testing, or medications not focused on comfort, peace, and dignity. This includes stopping antibiotics and weaning oxygen to room air, as these are generally not accepted as providing comfort but only prolonging the dying process artificially. All care would focus on how the patient is looking and feeling. This would include management of any symptoms that may cause discomfort, pain, shortness of breath/air hunger, increased work of breathing, cough, nausea, agitation/restlessness, anxiety, and/or secretions etc. Symptoms would be managed with medications and other non-pharmacological interventions such as spiritual support if requested, repositioning, music therapy, or therapeutic listening. Family verbalized understanding and agreement.   We also discussed her request for inpatient hospice at [facility].  She has known some people that have received care there and it was very good care and they were very happy with it. I described hospice as a service for patients who have a life expectancy of 6 months or less. The goal of hospice is the preservation of dignity and quality at the end phases of life. Under hospice care, the focus changes from curative to symptom relief. I explained the three setting where hospice services can be provided including the home, at a living facility (such as LTC SNF,  Assisted Living, etc), and a hospice facility. I explained that acceptance to hospice in any specific location is the final decision of the hospice medical director and bed availability, if applicable. They verbalized understanding.  She understands that it is up to the feels stability to accept her/improve her for  admission, and even if approved for admission it may be some time for bed to become available.    I shared that while we are waiting for hospice evaluation, and even if she is denied, she can remain in the hospital on comfort care as well.  We discussed home hospice as an option as well.  Unfortunately, the patient's daughter Blanchie was previously her caregiver and even when her mother was able to assist with these activities, it was very difficult.  She could not care for her on hospice at home alone.  I shared that we would continue to provide great care for her while waiting for hospice to make their decision and a bed become available.  She states the knowledge that she will not be kicked out of the hospital if hospice is unable to accept her relieves a lot of stress and anxiety she is experiencing.  I shared that while she is comfort care in the hospital I would continue to check on her daily and update daughter as well. I provided emotional and general support through therapeutic listening, empathy, sharing of stories, and other techniques. I answered all questions and addressed all concerns to the best of my ability.  Later in the day I received notification from hospice that she is not approved for inpatient hospice and they no longer have residential beds.  However, they could provide home hospice.  Discussion between hospice liaison, social worker, and myself.  It is clear that the patient's family cannot care for her at home with hospice.  She will remain here while exploring other options are waiting for decline such that she would be approved for inpatient hospice.  Review of Systems  Unable to perform ROS   Objective:   Primary Diagnoses: Present on Admission:  Hypothyroidism  Primary open angle glaucoma (POAG) of both eyes  Severe late onset Alzheimer's dementia (HCC)  Hyperlipidemia  Essential hypertension  CAD (coronary artery disease)  SIRS (systemic inflammatory response  syndrome) (HCC)   Vital Signs:  BP (!) 142/109 (BP Location: Right Arm)   Pulse 83   Temp 98.6 F (37 C)   Resp 19   Ht 5' 3 (1.6 m)   Wt 49.6 kg   SpO2 100%   BMI 19.37 kg/m   Physical Exam Vitals and nursing note reviewed.  Constitutional:      General: She is sleeping. She is not in acute distress.    Appearance: She is ill-appearing.     Comments: Appears frail  HENT:     Head: Normocephalic and atraumatic.  Pulmonary:     Effort: Pulmonary effort is normal. No respiratory distress.  Abdominal:     General: Abdomen is flat.  Skin:    General: Skin is warm and dry.  Neurological:     General: No focal deficit present.  Psychiatric:        Mood and Affect: Mood normal.        Behavior: Behavior normal.     Palliative Assessment/Data: 10%   Assessment & Plan:   HPI/Patient Profile:  89 y.o. female  with past medical history of hypertension, hyperlipidemia, hypothyroidism, CAD status post stent, dementia, glaucoma, DVT on Eliquis  presented  with confusion, wound changes, constipation.  She was admitted on 06/18/2024 with SIRS, severe sepsis secondary to infected decubitus ulcers, right hip abscess, osteomyelitis of the first metatarsal head of the right foot, acute septic encephalopathy, constipation, AKI, and others.    Palliative medicine was consulted for GOC conversations.  SUMMARY OF RECOMMENDATIONS   DNR-comfort Continue comfort care See symptom management orders below Palliative medicine will continue to follow  Symptom Management:  Tylenol  650 mg PR every 6 hours as needed mild pain (1-3) or fever Artificial tears 1 drop OU 4 times daily as needed dry eyes Fentanyl  25 mcg IV every hour as needed severe pain (7-10) Robinul  0.2 mg IV every 4 hours as needed extend secretions Haldol  0.5 mg IV every 4 hours as needed agitation or delirium Ativan  1 mg sublingual or 0.5 mg IV every 4 hours as needed anxiety Zofran  4 mg IV every 6 hours as needed nausea or  vomiting  Code Status: DNR-comfort  Prognosis: < 2 weeks  Discharge Planning: Hospice facility versus in-hospital death  Discussed with: Patient's family, medical team, nursing team, [facility] team, hospice liaison  Thank you for allowing us  to participate in the care of [facility] PMT will continue to support holistically.  Billing based on MDM: High  Problems Addressed: One acute or chronic illness or injury that poses a threat to life or bodily function  Risks: Parenteral controlled substances  Detailed review of medical records (labs, imaging, vital signs), medically appropriate exam, discussed with treatment team, counseling and education to patient, family, & staff, documenting clinical information, medication management, coordination of care  Camellia Kays, NP Palliative Medicine Team  Team Phone # [phone] (Nights/Weekends)  07/28/2021, 8:17 AM

## 2024-06-27 NOTE — Progress Notes (Signed)
 [facility]  Hospice hospital liaison note   Received request from [facility] for family interest in hospice inpatient unit. Visited patient and discussed hospice philosophy as well as inpatient hospice care with her daughter by phone.   At this time Sabrina Hart would not be approved for inpatient hospice care at [facility].    She is appropriate for hospice services in the home or LTC facility and we would be happy to reassess for inpatient unit appropriateness if she has decline.   Inpatient team and daughter notified.    Thank you for the opportunity to participate in this patient's care.    Elouise Husband, BSN, RN, [facility] Hospice hospital liaison [phone]

## 2024-06-27 NOTE — TOC Progression Note (Signed)
 Transition of Care [facility]) - Progression Note    Patient Details  Name: Sabrina Hart MRN: [id number] Date of Birth: October 28, 1932  Transition of Care [facility]) CM/SW Contact  Rosaline JONELLE Joe, RN Phone Number: 06/27/2024, 12:27 PM  Clinical Narrative:    CM spoke with Elouise Husband, RNCM with [facility] and they are unable to offer Inpatient Hospice bed at this time but will continue to follow the patient as she declines medically.  I spoke with the daughter by phone and she states that she is unable to provide care at the home with home hospice services and does not have enough family support to assist with total care needs.  Dr. Caleen was updated and Camellia, NP with Palliative Care team is aware of barriers at this time.  [facility] will continue to follow the patient as she remains under comfort care orders - awaiting Inpatient hospice placement.  Patient is able to eat/drink at this time and does not qualify for Inpatient hospice placement per [facility].                     Expected Discharge Plan and Services                                               Social Drivers of Health (SDOH) Interventions SDOH Screenings   Food Insecurity: No Food Insecurity (06/18/2024)  Housing: Low Risk  (06/18/2024)  Transportation Needs: No Transportation Needs (06/18/2024)  Utilities: Not At Risk (06/18/2024)  Depression (PHQ2-9): High Risk (03/09/2023)  Social Connections: Unknown (06/18/2024)  Tobacco Use: Low Risk  (06/18/2024)    Readmission Risk Interventions    06/26/2024    3:45 PM  Readmission Risk Prevention Plan  Transportation Screening Complete  PCP or Specialist Appt within 5-7 Days Complete  Home Care Screening Complete  Medication Review (RN CM) Complete

## 2024-06-28 DIAGNOSIS — Z515 Encounter for palliative care: Secondary | ICD-10-CM | POA: Diagnosis not present

## 2024-06-28 DIAGNOSIS — M160 Bilateral primary osteoarthritis of hip: Secondary | ICD-10-CM | POA: Diagnosis not present

## 2024-06-28 DIAGNOSIS — R6510 Systemic inflammatory response syndrome (SIRS) of non-infectious origin without acute organ dysfunction: Secondary | ICD-10-CM | POA: Diagnosis not present

## 2024-06-28 DIAGNOSIS — Z7189 Other specified counseling: Secondary | ICD-10-CM | POA: Diagnosis not present

## 2024-06-28 LAB — GLUCOSE, CAPILLARY
Glucose-Capillary: 63 mg/dL — ABNORMAL LOW (ref 70–99)
Glucose-Capillary: 72 mg/dL (ref 70–99)

## 2024-06-28 LAB — PHOSPHORUS: Phosphorus: 2.6 mg/dL (ref 2.5–4.6)

## 2024-06-28 MED ORDER — CEFTRIAXONE IV (FOR PTA / DISCHARGE USE ONLY): 2.0000 g | INTRAVENOUS | 0 refills | Status: DC

## 2024-06-28 MED ORDER — DAPTOMYCIN IV (FOR PTA / DISCHARGE USE ONLY): 400.0000 mg | INTRAVENOUS | 0 refills | Status: DC

## 2024-06-28 NOTE — Progress Notes (Signed)
Called [facility] to cancel transport. 

## 2024-06-28 NOTE — TOC Transition Note (Addendum)
 Transition of Care [facility]) - Discharge Note   Patient Details  Name: Sabrina Hart MRN: [id number] Date of Birth: December 29, 1931  Transition of Care [facility]) CM/SW Contact:  Rosaline JONELLE Joe, RN Phone Number: 06/28/2024, 2:48 PM   Clinical Narrative:    [name] available inpatient hospice room available today.  The patient's daughter plans to go to the facility and sign consents after 4 pm today.  [facility] was set up as a Will call and RN will be instructed later to Call [facility] back at [phone] for transportation to the facility.  DNR was placed in the [facility] packet.  06/28/2024 1627 - Patient's family signed consents - [facility] was called for transport to the facility.  RN aware.         Patient Goals and CMS Choice            Discharge Placement                       Discharge Plan and Services Additional resources added to the After Visit Summary for                                       Social Drivers of Health (SDOH) Interventions SDOH Screenings   Food Insecurity: No Food Insecurity (06/18/2024)  Housing: Low Risk  (06/18/2024)  Transportation Needs: No Transportation Needs (06/18/2024)  Utilities: Not At Risk (06/18/2024)  Depression (PHQ2-9): High Risk (03/09/2023)  Social Connections: Unknown (06/18/2024)  Tobacco Use: Low Risk  (06/18/2024)     Readmission Risk Interventions    06/26/2024    3:45 PM  Readmission Risk Prevention Plan  Transportation Screening Complete  PCP or Specialist Appt within 5-7 Days Complete  Home Care Screening Complete  Medication Review (RN CM) Complete

## 2024-06-28 NOTE — Plan of Care (Signed)
 Problem: Education: Goal: Knowledge of General Education information will improve Description: Including pain rating scale, medication(s)/side effects and non-pharmacologic comfort measures 06/28/2024 1700 by [employer], Tedi CROME, RN Outcome: Adequate for Discharge 06/28/2024 1640 by [employer], Berton Butrick L, RN Outcome: Progressing   Problem: Health Behavior/Discharge Planning: Goal: Ability to manage health-related needs will improve 06/28/2024 1700 by [employer], Deshayla Empson L, RN Outcome: Adequate for Discharge 06/28/2024 1640 by Jahmere Bramel L, RN Outcome: Progressing   Problem: Clinical Measurements: Goal: Ability to maintain clinical measurements within normal limits will improve 06/28/2024 1700 by [employer], Arnett Duddy L, RN Outcome: Adequate for Discharge 06/28/2024 1640 by Jonie Burdell L, RN Outcome: Progressing Goal: Will remain free from infection 06/28/2024 1700 by Emil Klassen L, RN Outcome: Adequate for Discharge 06/28/2024 1640 by Deniya Craigo, Nelvin Tomb L, RN Outcome: Progressing Goal: Diagnostic test results will improve 06/28/2024 1700 by Burlene Montecalvo, Tedi CROME, RN Outcome: Adequate for Discharge 06/28/2024 1640 by Kamran Coker L, RN Outcome: Progressing Goal: Respiratory complications will improve 06/28/2024 1700 by Brendan Gruwell L, RN Outcome: Adequate for Discharge 06/28/2024 1640 by Viana Sleep L, RN Outcome: Progressing Goal: Cardiovascular complication will be avoided 06/28/2024 1700 by Shaniah Baltes L, RN Outcome: Adequate for Discharge 06/28/2024 1640 by Lathen Seal L, RN Outcome: Progressing   Problem: Activity: Goal: Risk for activity intolerance will decrease 06/28/2024 1700 by [employer], Sindi Beckworth L, RN Outcome: Adequate for Discharge 06/28/2024 1640 by [employer], Bethanne Mule L, RN Outcome: Progressing   Problem: Nutrition: Goal: Adequate nutrition will be maintained 06/28/2024 1700 by [employer], Tedi CROME,  RN Outcome: Adequate for Discharge 06/28/2024 1640 by Chalee Hirota L, RN Outcome: Progressing   Problem: Coping: Goal: Level of anxiety will decrease 06/28/2024 1700 by [employer], Tamaya Pun L, RN Outcome: Adequate for Discharge 06/28/2024 1640 by [employer], Shajuana Mclucas L, RN Outcome: Progressing   Problem: Elimination: Goal: Will not experience complications related to bowel motility 06/28/2024 1700 by Raymond Tedi CROME, RN Outcome: Adequate for Discharge 06/28/2024 1640 by Rutilio Yellowhair L, RN Outcome: Progressing Goal: Will not experience complications related to urinary retention 06/28/2024 1700 by Raymond Tedi CROME, RN Outcome: Adequate for Discharge 06/28/2024 1640 by Sanayah Munro L, RN Outcome: Progressing   Problem: Pain Managment: Goal: General experience of comfort will improve and/or be controlled 06/28/2024 1700 by [employer], Wynelle Dreier L, RN Outcome: Adequate for Discharge 06/28/2024 1640 by Anniebell Bedore L, RN Outcome: Progressing   Problem: Safety: Goal: Ability to remain free from injury will improve 06/28/2024 1700 by Cloteal Isaacson L, RN Outcome: Adequate for Discharge 06/28/2024 1640 by Sekai Gitlin L, RN Outcome: Progressing   Problem: Skin Integrity: Goal: Risk for impaired skin integrity will decrease 06/28/2024 1700 by Caeson Filippi L, RN Outcome: Adequate for Discharge 06/28/2024 1640 by Jden Want L, RN Outcome: Progressing   Problem: Increased Nutrient Needs (NI-5.1) Goal: Food and/or nutrient delivery Description: Individualized approach for food/nutrient provision. Outcome: Adequate for Discharge   Problem: Education: Goal: Knowledge of the prescribed therapeutic regimen will improve 06/28/2024 1700 by [employer],  L, RN Outcome: Adequate for Discharge 06/28/2024 1640 by Janeah Kovacich L, RN Outcome: Progressing   Problem: Coping: Goal: Ability to identify and develop effective  coping behavior will improve 06/28/2024 1700 by , Tedi CROME, RN Outcome: Adequate for Discharge 06/28/2024 1640 by Biridiana Twardowski L, RN Outcome: Progressing   Problem: Clinical Measurements: Goal: Quality of life will improve 06/28/2024 1700 by Yarrow Linhart L, RN Outcome: Adequate for Discharge 06/28/2024 1640 by Genieve Ramaswamy L, RN Outcome: Progressing   Problem: Respiratory: Goal: Verbalizations of increased ease  of respirations will increase 06/28/2024 1700 by [employer], Tiron Suski L, RN Outcome: Adequate for Discharge 06/28/2024 1640 by Reynald Woods L, RN Outcome: Progressing   Problem: Role Relationship: Goal: Family's ability to cope with current situation will improve 06/28/2024 1700 by Nichelle Renwick, Tedi CROME, RN Outcome: Adequate for Discharge 06/28/2024 1640 by Tekeya Geffert L, RN Outcome: Progressing Goal: Ability to verbalize concerns, feelings, and thoughts to partner or family member will improve 06/28/2024 1700 by Raymond Tedi CROME, RN Outcome: Adequate for Discharge 06/28/2024 1640 by Lamont Tant L, RN Outcome: Progressing   Problem: Pain Management: Goal: Satisfaction with pain management regimen will improve 06/28/2024 1700 by Nikkie Liming L, RN Outcome: Adequate for Discharge 06/28/2024 1640 by Analena Gama, Tedi CROME, RN Outcome: Progressing

## 2024-06-28 NOTE — Plan of Care (Signed)

## 2024-06-28 NOTE — Progress Notes (Signed)
 Daily Progress Note   Date: 06/28/2024   Patient Name: Sabrina Hart  DOB: Apr 23, 1932  MRN: [id number]  Age / Sex: 89 y.o., female  Attending Physician: Caleen Burgess BROCKS, MD Primary Care Physician: [name], MD Admit Date: 06/18/2024 Length of Stay: 10 days  Reason for Follow-up: Establishing goals of care  Past Medical History:  Diagnosis Date   Anginal pain [facility])    1990's; 09/28/2012   Arthritis    very mild (09/28/2012)   Coronary artery disease 09/27/2012   s/p PCI of the RCA with DES with remote PCI in [place] 15 to 20 years ago. 2. cath 02/2013 patent mid RCA stent, 30% left main, 70% mid to distaal tanden 70% lesions x 3, 60% mid diag, 50% ostial LCx, 40% mid LCx.    Glaucoma    Hyperlipidemia    Hypothyroidism     Subjective:   Subjective: Chart Reviewed. Updates received. Patient Assessed. Created space and opportunity for patient  and family to explore thoughts and feelings regarding current medical situation.  Today's Discussion: Today before meeting with the patient/family, I reviewed the chart including nursing notes from today, hospitalist note from today.  No labs due to comfort care status.  I also reviewed vitals, medication administration record, nursing flow sheets.  On review of her chart her vitals are temperature of by 7.4, heart rate 84, respiratory rate 18, BP 134/72, saturation 77% on room air.  She did not require send management meds although we will leave them available because of anticipated need.  Today I saw the patient at bedside, no family was present.  I observed her for some time and she appears comfortable.  She did not have any obvious objective signs of distress or discomfort.  Respirations are even and unlabored, respiratory rate and counted by myself at 10.  No grimacing, groaning in discomfort,  Later in the day I received a message from [facility] that [facility] she is notified of her decline with dark urine  and 77% room air saturations.  They will reevaluate her today.  Sometime later after that they indicated that she was approved for transfer to [facility] and a bed was available today, working on consents.  However, just prior to patient transferring received notification that family has decided to cancel transfer to [facility] because they cannot afford the daily remain for charge.  If your she will remain here for end-of-life.  Review of Systems  Unable to perform ROS   Objective:   Primary Diagnoses: Present on Admission:  Hypothyroidism  Primary open angle glaucoma (POAG) of both eyes  Severe late onset Alzheimer's dementia (HCC)  Hyperlipidemia  Essential hypertension  CAD (coronary artery disease)  SIRS (systemic inflammatory response syndrome) (HCC)   Vital Signs:  BP 134/72 (BP Location: Left Wrist)   Pulse 84   Temp (!) 97.4 F (36.3 C) (Oral)   Resp 18   Ht 5' 3 (1.6 m)   Wt 49.6 kg   SpO2 (!) 77%   BMI 19.37 kg/m   Physical Exam Vitals and nursing note reviewed.  Constitutional:      General: She is sleeping. She is not in acute distress.    Appearance: She is ill-appearing.     Comments: Appears frail  HENT:     Head: Normocephalic and atraumatic.  Pulmonary:     Effort: Pulmonary effort is normal. No respiratory distress.     Comments: RR 10, even unlabored Abdominal:  General: Abdomen is flat.  Skin:    General: Skin is warm and dry.  Neurological:     General: No focal deficit present.  Psychiatric:        Mood and Affect: Mood normal.        Behavior: Behavior normal.     Palliative Assessment/Data: 10%   Assessment & Plan:   HPI/Patient Profile:  89 y.o. female  with past medical history of hypertension, hyperlipidemia, hypothyroidism, CAD status post stent, dementia, glaucoma, DVT on Eliquis  presented with confusion, wound changes, constipation.  She was admitted on 06/18/2024 with SIRS, severe sepsis secondary to infected decubitus  ulcers, right hip abscess, osteomyelitis of the first metatarsal head of the right foot, acute septic encephalopathy, constipation, AKI, and others.    Palliative medicine was consulted for GOC conversations.  SUMMARY OF RECOMMENDATIONS   DNR-comfort Continue comfort care See symptom management orders below Palliative medicine will continue to follow  Symptom Management:  Tylenol  650 mg PR every 6 hours as needed mild pain (1-3) or fever Artificial tears 1 drop OU 4 times daily as needed dry eyes Fentanyl  25 mcg IV every hour as needed severe pain (7-10) Robinul  0.2 mg IV every 4 hours as needed extend secretions Haldol  0.5 mg IV every 4 hours as needed agitation or delirium Ativan  1 mg sublingual or 0.5 mg IV every 4 hours as needed anxiety Zofran  4 mg IV every 6 hours as needed nausea or vomiting  Code Status: DNR-comfort  Prognosis: < 2 weeks  Discharge Planning: Anticipated Hospital Death  Discussed with: Medical team, nursing team, [facility] team, hospice liaison  Thank you for allowing us  to participate in the care of [facility] PMT will continue to support holistically.  Billing based on MDM: High  Problems Addressed: One acute or chronic illness or injury that poses a threat to life or bodily function  Risks: Parenteral controlled substances  Detailed review of medical records (labs, imaging, vital signs), medically appropriate exam, discussed with treatment team, counseling and education to patient, family, & staff, documenting clinical information, medication management, coordination of care  Camellia Kays, NP Palliative Medicine Team  Team Phone # [phone] (Nights/Weekends)  07/28/2021, 8:17 AM

## 2024-06-28 NOTE — Progress Notes (Signed)
 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    CT abdomen pelvis showed crescentic abscess at the right hip measuring 12 x 2 x 12 cm, prominent stool favoring constipation, mesenteric edema, severe arthropathy of the hips possible synovitis in the right hip.    Patient received vancomycin , aztreonam  in the ED in the setting of penicillin allergy. Was also diagnosed with right foot abscess and osteomyelitis.  Orthopedic and general surgery consulted, and did not feel there is indication for I&D and recommended wound care and palliative care consult.  After meeting with palliative care, family decided to continue current scope of care with antibiotics.  ID consulted on 7/26, and escalated antibiotics to daptomycin , ceftriaxone  and Flagyl .   After goal of care discussion with patient's daughter, transition to full comfort care on 7/29.  TOC consulted for residential hospice placement.   Assessment & Plan:  Principal Problem:   SIRS (systemic inflammatory response syndrome) (HCC) Active Problems:   CAD (coronary artery disease)   Hypothyroidism   Hyperlipidemia   Severe late onset Alzheimer's dementia (HCC)   Essential hypertension   Primary open angle glaucoma (POAG) of both eyes   Pressure injury of right hip, unstageable (HCC)   Gangrene of right foot (HCC)   Pressure injury of skin    End-of-life care-transitioned to full comfort care on 7/29 after discussion with patient's daughter  at bedside. - Comfort pathway with as needed medications-ordered. - Family interested in residential hospice.  TOC consulted for referral.   Severe sepsis due to infected decubitus ulcers, right hip abscess and right foot abscess and osteomyelitis: Present on admission.  Had tachypnea and leukocytosis and lactic acid of 3.7. -CT abdomen and pelvis as above. -MRI of right foot ulceration medial to the first MTP joint with underlying abscess and osteomyelitis of the first metatarsal head, its sesmoids and the base of first proximal phalanx -Evaluated by Dr. Stan to be not a candidate for endovascular evaluation due to her flexor contracture -Ortho and surgery did not feel I&D is indicated.  IR concurred with surgery and Ortho. -Surgery recommended hydrotherapy, WTD, air mattress and frequent turning and offloading. -Family expressed desire to continue current scope of care with antibiotics.  No tube feed. -Was Duricef and doxy.  ID consulted on 7/26 and escalated to daptomycin , CTX and Flagyl  -PICC line placed on 7/28 -Transitioned to full comfort care on 7/29.   Acute septic encephalopathy, POA: sleepy but wakes to voice.  Only mumbles words. Dementia without behavioral disturbance   Positive blood culture: Blood culture with Staphylococcus capitis in 1 out of 2 bottles likely contaminant   Right kidney lesion: Favoring a cyst but is indeterminant -MRI recommended for follow-up but doubt utility given patient's age and poor prognosis   Hypoglycemia: 47 initially, required dextrose  fluid.  Now on diet, sugar controlled.   Constipation: MiraLAX  and Fleet enema and Colace ordered.  Constipation resolved.   Hypertension normotensive off home antihypertensive meds.   AKI: Likely ATN due to severe sepsis.  Resolved.  Hypokalemia   Mild hyponatremia: Resolved.   Normocytic anemia: Stable.   Hyperlipidemia   CAD: Status post stents   Hypothyroidism - Continue home Synthroid     Glaucoma   History of DVT   Increased nutrient needs Body mass index is 19.37 kg/m. Nutrition Problem: Increased nutrient needs Etiology: wound healing Signs/Symptoms: estimated needs Interventions: Ensure Enlive (each supplement provides 350kcal and 20 grams of protein), MVI, Prostat   Consultants:  Orthopedic surgery General surgery Palliative medicine Infectious disease Interventional radiology   Procedures: None   Microbiology summarized: MRSA PCR screen nonreactive Blood culture with Staph capitis in 1 out of 2 bottles   DVT prophylaxis:  Patient is full comfort care   Code Status: DNR-comfort Family Communication:  Level of care: Telemetry Medical Status is: Inpatient Remains inpatient appropriate because: End-of-life care; safe dispo pending      Subjective:  No complaints Poor PO intake.  Low BS overnight.   Examination:  General exam: Appears calm and comfortable  Respiratory system: Clear to auscultation. Respiratory effort normal. Cardiovascular system: S1 & S2 heard, RRR. No JVD, murmurs, rubs, gallops or clicks. No pedal edema. Gastrointestinal system: Abdomen is nondistended, soft and nontender. No organomegaly or masses felt. Normal bowel sounds heard. Central nervous system: Sleepy, occasionally mumbles some words Extremities: Symmetric 4 x 5 power. Skin: No rashes, lesions or ulcers Psychiatry: No agitation                  Objective: Vitals:   06/27/24 1612 06/27/24 2003 06/28/24 0000 06/28/24 0738  BP: (!) 137/103 (!) 147/62 125/79 134/72  Pulse: (!) 105 89 79 84  Resp: 18 18 16 18   Temp: (!) 97.3 F (36.3 C) 97.6 F (36.4 C) 97.6 F (36.4 C) (!) 97.4 F (36.3 C)  TempSrc:  Oral Axillary Oral  SpO2: 100% 100% 100% (!) 77%  Weight:      Height:        Intake/Output Summary (Last 24 hours) at 06/28/2024 1108 Last data filed at 06/28/2024 9379 Gross per 24 hour  Intake 118 ml  Output --  Net 118 ml   Filed  Weights   06/18/24 0936 06/18/24 1938  Weight: 52.2 kg 49.6 kg    Scheduled Meds:  Chlorhexidine  Gluconate Cloth  6 each Topical Daily   collagenase    Topical Daily   feeding supplement  237 mL Oral TID BM   levothyroxine   88 mcg Oral Daily   Continuous Infusions:  Nutritional status Signs/Symptoms: estimated needs Interventions: Ensure Enlive (each supplement provides 350kcal and 20 grams of protein), MVI, Prostat Body mass index is 19.37 kg/m.  Data Reviewed:   CBC: Recent Labs  Lab 06/23/24 0231 06/24/24 0248 06/25/24 0902 06/26/24 0306  WBC 4.7 3.9* 6.5 5.2  NEUTROABS 2.8  --   --   --   HGB 10.0* 9.7* 10.5* 9.5*  HCT 30.5* 29.3* 32.6* 29.4*  MCV 85.4 85.4 86.2 86.7  PLT 371 425* 498* 456*   Basic Metabolic Panel: Recent Labs  Lab 06/24/24 0248 06/24/24 0703 06/25/24 0902 06/28/24 0352  NA 139  --  139  --   K 2.9*  --  3.7  --   CL 105  --  107  --   CO2 24  --  22  --   GLUCOSE 86  --  79  --   BUN 15  --  10  --   CREATININE 0.53  --  0.55  --   CALCIUM  7.9*  --  7.9*  --   MG  --  1.9 1.9  --   PHOS  --   --  2.6 2.6   GFR: Estimated Creatinine Clearance: 36.6 mL/min (by C-G formula based on SCr of 0.55 mg/dL). Liver Function Tests: Recent Labs  Lab 06/25/24 0902  ALBUMIN 1.9*   No results for input(s): LIPASE, AMYLASE in the last 168 hours. No results for input(s): AMMONIA in the last 168 hours. Coagulation Profile: No results for input(s): INR, PROTIME in the last 168 hours. Cardiac Enzymes: Recent Labs  Lab 06/23/24 1646  CKTOTAL 16*   BNP (last 3 results) No results for input(s): PROBNP in the last 8760 hours. HbA1C: No results for input(s): HGBA1C in the last 72 hours. CBG: Recent Labs  Lab 06/26/24 1136 06/27/24 2004 06/27/24 2050 06/27/24 2054 06/28/24 0016  GLUCAP 92 58* 50* 94 72   Lipid Profile: No results for input(s): CHOL, HDL, LDLCALC, TRIG, CHOLHDL, LDLDIRECT in the last 72  hours. Thyroid  Function Tests: No results for input(s): TSH, T4TOTAL, FREET4, T3FREE, THYROIDAB in the last 72 hours. Anemia Panel: No results for input(s): VITAMINB12, FOLATE, FERRITIN, TIBC, IRON, RETICCTPCT in the last 72 hours. Sepsis Labs: No results for input(s): PROCALCITON, LATICACIDVEN in the last 168 hours.  Recent Results (from the past 240 hours)  MRSA Next Gen by PCR, Nasal     Status: None   Collection Time: 06/18/24  8:47 PM   Specimen: Nasal Mucosa; Nasal Swab  Result Value Ref Range Status   MRSA by PCR Next Gen NOT DETECTED NOT DETECTED Final    Comment: (NOTE) The GeneXpert MRSA Assay (FDA approved for NASAL specimens only), is one component of a comprehensive MRSA colonization surveillance program. It is not intended to diagnose MRSA infection nor to guide or monitor treatment for MRSA infections. Test performance is not FDA approved in patients less than 62 years old. Performed at [facility], [address], [place], [place] [address]          Radiology Studies: No results found.         LOS: 10 days   Time spent= 35 mins    Burgess JAYSON Dare, MD [facility]  If 7PM-7AM, please contact night-coverage  06/28/2024, 11:08 AM

## 2024-06-28 NOTE — Progress Notes (Signed)
 Report given to [facility] at [facility].

## 2024-06-28 NOTE — Progress Notes (Signed)
 [facility] called and canceled by SWAT nurse. Received notification that family does not wish to proceed with DC to [facility] place.

## 2024-06-28 NOTE — Progress Notes (Addendum)
 Sabrina Hart [phone] [facility] Liaison Note  Following up on referral form 7.30.  Patient is approved for routine level care at [facility]. Spoke with TOC and patients daughter to update. Transfer pending signed consents from patients daughter.  RN to call report prior to patient transfer from unit.  512pm update: patient's daughter would like to cancel plans to discharge to [facility] due to room and board fees. Inpatient team notified.   We are happy to provide hospice care in the home or other setting if needed.   Thank you, Daphne Shed, LPN [facility] Liaison

## 2024-06-28 NOTE — Discharge Summary (Signed)
 Physician Discharge Summary  Landyn Lorincz FMW:[id number] DOB: February 02, 1932 DOA: 06/18/2024  PCP: [name], MD  Admit date: 06/18/2024 Discharge date: 06/28/2024  Admitted From: Home Disposition: Residential hospice  Recommendations for Outpatient Follow-up:  Transfer to residential hospice   Discharge Condition: Stable CODE STATUS: DNR Diet recommendation: Comfort feeds  Brief/Interim Summary: Brief Narrative:  89 year old F with PMH of dementia, CAD/stent, HTN, HLD, hypothyroidism, glaucoma, DVT on Eliquis  and chronic bilateral gluteal decubitus and right foot ulcer presenting with increased CPH/bloody drainage after starting Eliquis  for DVT, decline in mentation and activity 4 weeks, and admitted with severe sepsis due to infected decubitus ulcer, right hip abscess and osteomyelitis of first metatarsal head of the right foot.    CT abdomen pelvis showed crescentic abscess at the right hip measuring 12 x 2 x 12 cm, prominent stool favoring constipation, mesenteric edema, severe arthropathy of the hips possible synovitis in the right hip.    Patient received vancomycin , aztreonam  in the ED in the setting of penicillin allergy. Was also diagnosed with right foot abscess and osteomyelitis.  Orthopedic and general surgery consulted, and did not feel there is indication for I&D and recommended wound care and palliative care consult.  After meeting with palliative care, family decided to continue current scope of care with antibiotics.  ID consulted on 7/26, and escalated antibiotics to daptomycin , ceftriaxone  and Flagyl .   After goal of care discussion with patient's daughter, transition to full comfort care on 7/29.  TOC consulted for residential hospice placement.   Assessment & Plan:  Principal Problem:   SIRS (systemic inflammatory response syndrome) (HCC) Active Problems:   CAD (coronary artery disease)   Hypothyroidism   Hyperlipidemia   Severe late onset Alzheimer's  dementia (HCC)   Essential hypertension   Primary open angle glaucoma (POAG) of both eyes   Pressure injury of right hip, unstageable (HCC)   Gangrene of right foot (HCC)   Pressure injury of skin    End-of-life care-transitioned to full comfort care on 7/29 after discussion with patient's daughter at bedside. - Comfort pathway with as needed medications-ordered. - Family interested in residential hospice.  TOC consulted for referral.   Severe sepsis due to infected decubitus ulcers, right hip abscess and right foot abscess and osteomyelitis: Present on admission.  Had tachypnea and leukocytosis and lactic acid of 3.7. -CT abdomen and pelvis as above. -MRI of right foot ulceration medial to the first MTP joint with underlying abscess and osteomyelitis of the first metatarsal head, its sesmoids and the base of first proximal phalanx -Evaluated by Dr. Stan to be not a candidate for endovascular evaluation due to her flexor contracture -Ortho and surgery did not feel I&D is indicated.  IR concurred with surgery and Ortho. -Surgery recommended hydrotherapy, WTD, air mattress and frequent turning and offloading. -Family expressed desire to continue current scope of care with antibiotics.  No tube feed. -Was Duricef and doxy.  ID consulted on 7/26 and escalated to daptomycin , CTX and Flagyl  -PICC line placed on 7/28 -Transitioned to full comfort care on 7/29.   Acute septic encephalopathy, POA: sleepy but wakes to voice.  Only mumbles words. Dementia without behavioral disturbance   Positive blood culture: Blood culture with Staphylococcus capitis in 1 out of 2 bottles likely contaminant   Right kidney lesion: Favoring a cyst but is indeterminant -MRI recommended for follow-up but doubt utility given patient's age and poor prognosis   Hypoglycemia: 47 initially, required dextrose  fluid.  Now on diet, sugar  controlled.   Constipation: MiraLAX  and Fleet enema and Colace ordered.   Constipation resolved.   Hypertension normotensive off home antihypertensive meds.   AKI: Likely ATN due to severe sepsis.  Resolved.   Hypokalemia   Mild hyponatremia: Resolved.   Normocytic anemia: Stable.   Hyperlipidemia   CAD: Status post stents   Hypothyroidism - Continue home Synthroid    Glaucoma   History of DVT   Increased nutrient needs Body mass index is 19.37 kg/m. Nutrition Problem: Increased nutrient needs Etiology: wound healing Signs/Symptoms: estimated needs Interventions: Ensure Enlive (each supplement provides 350kcal and 20 grams of protein), MVI, Prostat   Consultants:  Orthopedic surgery General surgery Palliative medicine Infectious disease Interventional radiology   Procedures: None   Microbiology summarized: MRSA PCR screen nonreactive Blood culture with Staph capitis in 1 out of 2 bottles   DVT prophylaxis:  Patient is full comfort care   Code Status: DNR-comfort Family Communication:  Level of care: Telemetry Medical Status is: Inpatient Remains inpatient appropriate because: End-of-life care; safe dispo pending      Subjective:  No complaints Poor PO intake.  Low BS overnight.   Examination:  General exam: Appears calm and comfortable  Respiratory system: Clear to auscultation. Respiratory effort normal. Cardiovascular system: S1 & S2 heard, RRR. No JVD, murmurs, rubs, gallops or clicks. No pedal edema. Gastrointestinal system: Abdomen is nondistended, soft and nontender. No organomegaly or masses felt. Normal bowel sounds heard. Central nervous system: Sleepy, occasionally mumbles some words Extremities: Symmetric 4 x 5 power. Skin: No rashes, lesions or ulcers Psychiatry: No agitation    Discharge Diagnoses:  Principal Problem:   SIRS (systemic inflammatory response syndrome) (HCC) Active Problems:   CAD (coronary artery disease)   Hypothyroidism   Hyperlipidemia   Severe late onset Alzheimer's dementia  (HCC)   Essential hypertension   Primary open angle glaucoma (POAG) of both eyes   Pressure injury of right hip, unstageable (HCC)   Gangrene of right foot (HCC)   Pressure injury of skin      Discharge Exam: Vitals:   06/28/24 0000 06/28/24 0738  BP: 125/79 134/72  Pulse: 79 84  Resp: 16 18  Temp: 97.6 F (36.4 C) (!) 97.4 F (36.3 C)  SpO2: 100% (!) 77%   Vitals:   06/27/24 1612 06/27/24 2003 06/28/24 0000 06/28/24 0738  BP: (!) 137/103 (!) 147/62 125/79 134/72  Pulse: (!) 105 89 79 84  Resp: 18 18 16 18   Temp: (!) 97.3 F (36.3 C) 97.6 F (36.4 C) 97.6 F (36.4 C) (!) 97.4 F (36.3 C)  TempSrc:  Oral Axillary Oral  SpO2: 100% 100% 100% (!) 77%  Weight:      Height:          Discharge Instructions    Allergies as of 06/28/2024       Reactions   Other Other (See Comments)   Metals; skin gets weepy; gets worse if it makes contact (09/28/2012)   Pollen Extract Other (See Comments)   sneezing; watery eyes   Latex Itching, Swelling, Other (See Comments)   Skin swells   Morphine And Codeine Rash, Other (See Comments)   Patient does not prefer to take these, but if absolutely necessary, accommodations can be made.   Nickel Other (See Comments)   Causes weepiness (skin)   Penicillins Rash   Tolerates ceftriaxone  and cefadroxil         Medication List     STOP taking these medications    amLODipine  5  MG tablet Commonly known as: NORVASC    apixaban  5 MG Tabs tablet Commonly known as: Eliquis    levothyroxine  88 MCG tablet Commonly known as: SYNTHROID    magnesium oxide 400 (240 Mg) MG tablet Commonly known as: MAG-OX   PROCare Adult Briefs X-Large Misc   risperiDONE  0.5 MG tablet Commonly known as: RISPERDAL    sertraline  25 MG tablet Commonly known as: ZOLOFT    Simbrinza 1-0.2 % Susp Generic drug: Brinzolamide-Brimonidine        Allergies  Allergen Reactions   Other Other (See Comments)    Metals; skin gets weepy; gets worse  if it makes contact (09/28/2012)   Pollen Extract Other (See Comments)    sneezing; watery eyes   Latex Itching, Swelling and Other (See Comments)    Skin swells   Morphine And Codeine Rash and Other (See Comments)    Patient does not prefer to take these, but if absolutely necessary, accommodations can be made.   Nickel Other (See Comments)    Causes weepiness (skin)   Penicillins Rash    Tolerates ceftriaxone  and cefadroxil     You were cared for by a hospitalist during your hospital stay. If you have any questions about your discharge medications or the care you received while you were in the hospital after you are discharged, you can call the unit and asked to speak with the hospitalist on call if the hospitalist that took care of you is not available. Once you are discharged, your primary care physician will handle any further medical issues. Please note that no refills for any discharge medications will be authorized once you are discharged, as it is imperative that you return to your primary care physician (or establish a relationship with a primary care physician if you do not have one) for your aftercare needs so that they can reassess your need for medications and monitor your lab values.  You were cared for by a hospitalist during your hospital stay. If you have any questions about your discharge medications or the care you received while you were in the hospital after you are discharged, you can call the unit and asked to speak with the hospitalist on call if the hospitalist that took care of you is not available. Once you are discharged, your primary care physician will handle any further medical issues. Please note that NO REFILLS for any discharge medications will be authorized once you are discharged, as it is imperative that you return to your primary care physician (or establish a relationship with a primary care physician if you do not have one) for your aftercare needs so that  they can reassess your need for medications and monitor your lab values.  Please request your Prim.MD to go over all Hospital Tests and Procedure/Radiological results at the follow up, please get all Hospital records sent to your Prim MD by signing hospital release before you go home.  Get CBC, CMP, 2 view Chest X ray checked  by Primary MD during your next visit or SNF MD in 5-7 days ( we routinely change or add medications that can affect your baseline labs and fluid status, therefore we recommend that you get the mentioned basic workup next visit with your PCP, your PCP may decide not to get them or add new tests based on their clinical decision)  On your next visit with your primary care physician please Get Medicines reviewed and adjusted.  If you experience worsening of your admission symptoms, develop shortness of breath, life threatening  emergency, suicidal or homicidal thoughts you must seek medical attention immediately by calling 911 or calling your MD immediately  if symptoms less severe.  You Must read complete instructions/literature along with all the possible adverse reactions/side effects for all the Medicines you take and that have been prescribed to you. Take any new Medicines after you have completely understood and accpet all the possible adverse reactions/side effects.   Do not drive, operate heavy machinery, perform activities at heights, swimming or participation in water activities or provide baby sitting services if your were admitted for syncope or siezures until you have seen by Primary MD or a Neurologist and advised to do so again.  Do not drive when taking Pain medications.   Procedures/Studies: US  EKG SITE RITE Result Date: 06/25/2024 If Site Rite image not attached, placement could not be confirmed due to current cardiac rhythm.  VAS US  ABI WITH/WO TBI Result Date: 06/19/2024  LOWER EXTREMITY DOPPLER STUDY Patient Name:  Starlene Consuegra  Date of Exam:    06/19/2024 Medical Rec #: [id number]           Accession #:    [id number] Date of Birth: 17-Jun-1932            Patient Gender: F Patient Age:   68 years Exam Location:  [facility] Procedure:      VAS US  ABI WITH/WO TBI Referring Phys: MICHAEL JEFFERY --------------------------------------------------------------------------------  Indications: Ulceration.  Limitations: Today's exam was limited due to patient positioning and patient              unable to lie flat. Performing Technologist: Jimmye Scarce RVT  Examination Guidelines: A complete evaluation includes at minimum, Doppler waveform signals and systolic blood pressure reading at the level of bilateral brachial, anterior tibial, and posterior tibial arteries, when vessel segments are accessible. Bilateral testing is considered an integral part of a complete examination. Photoelectric Plethysmograph (PPG) waveforms and toe systolic pressure readings are included as required and additional duplex testing as needed. Limited examinations for reoccurring indications may be performed as noted.  ABI Findings: +--------+------------------+-----+----------+--------+ Right   Rt Pressure (mmHg)IndexWaveform  Comment  +--------+------------------+-----+----------+--------+ Brachial                                 IV       +--------+------------------+-----+----------+--------+ PTA     80                0.76 monophasic         +--------+------------------+-----+----------+--------+ DP      78                0.74 monophasic         +--------+------------------+-----+----------+--------+ +--------+------------------+-----+--------+------------------+ Left    Lt Pressure (mmHg)IndexWaveformComment            +--------+------------------+-----+--------+------------------+ [id number]                                               +--------+------------------+-----+--------+------------------+ PTA                                     pt has contracture +--------+------------------+-----+--------+------------------+ DP      106  1.01 biphasic                   +--------+------------------+-----+--------+------------------+ +-------+-----------+-----------+------------+------------+ ABI/TBIToday's ABIToday's TBIPrevious ABIPrevious TBI +-------+-----------+-----------+------------+------------+ Right  0.76                                           +-------+-----------+-----------+------------+------------+ Left   1.01                                           +-------+-----------+-----------+------------+------------+  Summary: Right: Resting right ankle-brachial index indicates moderate right lower extremity arterial disease. Left: Resting left ankle-brachial index is within normal range. *See table(s) above for measurements and observations.  Electronically signed by Norman Serve on 06/19/2024 at 5:14:14 PM.    Final    MR FOOT RIGHT W WO CONTRAST Result Date: 06/19/2024 CLINICAL DATA:  Soft tissue infection suspected, foot, xray done EXAM: MRI OF THE RIGHT FOREFOOT WITHOUT AND WITH CONTRAST TECHNIQUE: Multiplanar, multisequence MR imaging of the right forefoot was performed before and after the administration of intravenous contrast. CONTRAST:  4mL GADAVIST  GADOBUTROL  1 MMOL/ML IV SOLN COMPARISON:  None Available. FINDINGS: Technical note: Despite efforts by the technologist and patient, mild-to-moderate motion artifact is present on today's exam and could not be eliminated. This reduces exam sensitivity and specificity. Bones/Joint/Cartilage There is a large area of soft tissue ulceration medial to the 1st metatarsophalangeal joint, further described below. There are underlying marrow changes within the 1st metatarsal head, its sesamoids and the base of the 1st proximal phalanx, highly suspicious for osteomyelitis. Specifically, there is decreased T1 signal, T2 hyperintensity and heterogeneous  enhancement following contrast. There is apparent cortical destruction involving the medial head of the 1st metatarsal T2 hyperintensity extends proximally into the mid shaft of the 1st metatarsal. There is a small effusion of the 1st metatarsophalangeal joint with associated synovial enhancement following contrast. No suspicious marrow signal abnormalities or enhancement elsewhere in the forefoot. Mild midfoot and 2nd metatarsophalangeal joint degenerative changes. Ligaments Intact Lisfranc ligament. The medial collateral ligament the 1st metatarsophalangeal joint is not well visualized. The additional collateral ligaments appear intact. Muscles and Tendons Mild generalized muscular atrophy and edema with low level muscular enhancement medially. No discrete intramuscular fluid collections are identified. The forefoot tendons appear intact without significant tenosynovitis. Soft tissues As above, large area of soft tissue ulceration medial to the 1st metatarsophalangeal joint within underlying heterogeneous, peripherally enhancing fluid collection measuring approximately 2.2 x 2.6 x 1.7 cm, suspicious for an abscess. This abuts the 1st metatarsophalangeal joint and is associated with adjacent synovial enhancement and marrow changes consistent with osteomyelitis, as described above. Generalized subcutaneous edema throughout the forefoot without other focal fluid collection. IMPRESSION: 1. Large area of soft tissue ulceration medial to the 1st metatarsophalangeal joint with underlying abscess and underlying osteomyelitis of the 1st metatarsal head, its sesamoids and base of the 1st proximal phalanx. 2. The abscess abuts the 1st metatarsophalangeal joint and is associated with a small effusion and synovial enhancement, suspicious for septic arthritis. 3. No evidence of osteomyelitis elsewhere in the forefoot. 4. Generalized subcutaneous edema throughout the forefoot without other focal fluid collection. Electronically  Signed   By: Elsie Perone M.D.   On: 06/19/2024 08:53   CT ABDOMEN PELVIS W CONTRAST Result Date: 06/18/2024 CLINICAL DATA:  Bowel obstruction EXAM: CT ABDOMEN AND PELVIS WITH CONTRAST TECHNIQUE: Multidetector CT imaging of the abdomen and pelvis was performed using the standard protocol following bolus administration of intravenous contrast. RADIATION DOSE REDUCTION: This exam was performed according to the departmental dose-optimization program which includes automated exposure control, adjustment of the mA and/or kV according to patient size and/or use of iterative reconstruction technique. CONTRAST:  75mL OMNIPAQUE  IOHEXOL  350 MG/ML SOLN COMPARISON:  None Available. FINDINGS: The patient was imaged in a contracted position laying on her left side, resulting in some shifting structures. 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 Descending thoracic aortic and right coronary artery atherosclerosis. The left lung base is excluded. Hepatobiliary: Unremarkable Pancreas: Unremarkable Spleen: Unremarkable where included Adrenals/Urinary Tract: 2.2 cm homogeneous right kidney lower pole lesion favoring a cyst but with internal density of 44 Hounsfield units on portal venous phase images. This is technically indeterminate for cyst versus mass. In most clinical circumstances, renal protocol MRI or CT with and without contrast would be recommended for follow up of this lesion, although correlation with the patient's overall clinical scenario is recommended. Adrenal glands unremarkable. No hydronephrosis or hydroureter. Urinary bladder unremarkable. Stomach/Bowel: Prominent stool throughout the colon favors constipation. No definite dilated small bowel loops are identified, although bowel loops are relatively indistinct in portions of the abdomen. Vascular/Lymphatic: Atherosclerosis is present, including aortoiliac atherosclerotic disease. Substantial  atheromatous vascular plaque at the origin of the celiac trunk and SMA contributing to potentially high-grade stenosis although both vessels appear to opacify and accordingly are likely not completely occluded. There is additional plaque further distally in the SMA. Reproductive: Calcified uterine fibroids. Other: Mesenteric edema. Difficult to exclude a small amount of ascites. Musculoskeletal: Suspected abscess in the subcutaneous tissues lateral to the right hip measuring about 12.0 by 2.0 by 12.0 cm (volume = 150 cm^3). Subcutaneous edema in the right proximal thigh posteriorly Severe arthropathy of the hips, right greater than left, with volume loss in the femoral heads. Flattening and spurring of the right acetabulum with potential erosions. Possible synovitis in the right hip joint. Degenerative arthropathy of the right elbow which is included in the field of imaging. IMPRESSION: 1. Crescentic abscess in the subcutaneous tissues lateral to the right hip measuring about 12.0 by 2.0 by 12.0 cm (volume = 150 cc). 2. Prominent stool throughout the colon favors constipation. 3. Mesenteric edema. Difficult to exclude a small amount of ascites. 4. Severe arthropathy of the hips, right greater than left, with volume loss in the femoral heads. Flattening and spurring of the right acetabulum with potential erosions. Possible synovitis in the right hip joint. 5. 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 6. 2.2 cm homogeneous right kidney lower pole lesion favoring a cyst but with internal density of 44 Hounsfield units on portal venous phase images. This is technically indeterminate for cyst versus mass. In most clinical circumstances, renal protocol MRI or CT with and without contrast would be recommended for follow up of this lesion, although correlation with the patient's overall clinical scenario is recommended. 7. Calcified uterine fibroids. 8. Aortic  Atherosclerosis (ICD10-I70.0). Notable atherosclerosis proximally in the celiac trunk and SMA with substantial stenosis although no overt occlusion. 9. Reduced diagnostic sensitivity and specificity related to patient's contracted state and left side down lateral decubitus positioning during imaging. Electronically Signed   By: Ryan Salvage M.D.   On: 06/18/2024 13:07     The results  of significant diagnostics from this hospitalization (including imaging, microbiology, ancillary and laboratory) are listed below for reference.     Microbiology: Recent Results (from the past 240 hours)  MRSA Next Gen by PCR, Nasal     Status: None   Collection Time: 06/18/24  8:47 PM   Specimen: Nasal Mucosa; Nasal Swab  Result Value Ref Range Status   MRSA by PCR Next Gen NOT DETECTED NOT DETECTED Final    Comment: (NOTE) The GeneXpert MRSA Assay (FDA approved for NASAL specimens only), is one component of a comprehensive MRSA colonization surveillance program. It is not intended to diagnose MRSA infection nor to guide or monitor treatment for MRSA infections. Test performance is not FDA approved in patients less than 81 years old. Performed at [facility], [address], [place], [place] [address]      Labs: BNP (last 3 results) No results for input(s): BNP in the last 8760 hours. Basic Metabolic Panel: Recent Labs  Lab 06/24/24 0248 06/24/24 0703 06/25/24 0902 06/28/24 0352  NA 139  --  139  --   K 2.9*  --  3.7  --   CL 105  --  107  --   CO2 24  --  22  --   GLUCOSE 86  --  79  --   BUN 15  --  10  --   CREATININE 0.53  --  0.55  --   CALCIUM  7.9*  --  7.9*  --   MG  --  1.9 1.9  --   PHOS  --   --  2.6 2.6   Liver Function Tests: Recent Labs  Lab 06/25/24 0902  ALBUMIN 1.9*   No results for input(s): LIPASE, AMYLASE in the last 168 hours. No results for input(s): AMMONIA in the last 168 hours. CBC: Recent Labs  Lab 06/23/24 0231 06/24/24 0248  06/25/24 0902 06/26/24 0306  WBC 4.7 3.9* 6.5 5.2  NEUTROABS 2.8  --   --   --   HGB 10.0* 9.7* 10.5* 9.5*  HCT 30.5* 29.3* 32.6* 29.4*  MCV 85.4 85.4 86.2 86.7  PLT 371 425* 498* 456*   Cardiac Enzymes: Recent Labs  Lab 06/23/24 1646  CKTOTAL 16*   BNP: Invalid input(s): POCBNP CBG: Recent Labs  Lab 06/26/24 1136 06/27/24 2004 06/27/24 2050 06/27/24 2054 06/28/24 0016  GLUCAP 92 58* 50* 94 72   D-Dimer No results for input(s): DDIMER in the last 72 hours. Hgb A1c No results for input(s): HGBA1C in the last 72 hours. Lipid Profile No results for input(s): CHOL, HDL, LDLCALC, TRIG, CHOLHDL, LDLDIRECT in the last 72 hours. Thyroid  function studies No results for input(s): TSH, T4TOTAL, T3FREE, THYROIDAB in the last 72 hours.  Invalid input(s): FREET3 Anemia work up No results for input(s): VITAMINB12, FOLATE, FERRITIN, TIBC, IRON, RETICCTPCT in the last 72 hours. Urinalysis    Component Value Date/Time   COLORURINE YELLOW 06/18/2024 0121   APPEARANCEUR HAZY (A) 06/18/2024 0121   LABSPEC >1.046 (H) 06/18/2024 0121   PHURINE 5.0 06/18/2024 0121   GLUCOSEU NEGATIVE 06/18/2024 0121   HGBUR SMALL (A) 06/18/2024 0121   BILIRUBINUR NEGATIVE 06/18/2024 0121   BILIRUBINUR neg 04/06/2023 1443   KETONESUR NEGATIVE 06/18/2024 0121   PROTEINUR NEGATIVE 06/18/2024 0121   UROBILINOGEN negative (A) 04/06/2023 1443   NITRITE NEGATIVE 06/18/2024 0121   LEUKOCYTESUR SMALL (A) 06/18/2024 0121   Sepsis Labs Recent Labs  Lab 06/23/24 0231 06/24/24 0248 06/25/24 0902 06/26/24 0306  WBC  4.7 3.9* 6.5 5.2   Microbiology Recent Results (from the past 240 hours)  MRSA Next Gen by PCR, Nasal     Status: None   Collection Time: 06/18/24  8:47 PM   Specimen: Nasal Mucosa; Nasal Swab  Result Value Ref Range Status   MRSA by PCR Next Gen NOT DETECTED NOT DETECTED Final    Comment: (NOTE) The GeneXpert MRSA Assay (FDA approved for NASAL  specimens only), is one component of a comprehensive MRSA colonization surveillance program. It is not intended to diagnose MRSA infection nor to guide or monitor treatment for MRSA infections. Test performance is not FDA approved in patients less than 38 years old. Performed at [facility], [address], [place], [place] [address]      Time coordinating discharge:  I have spent 35 minutes face to face with the patient and on the ward discussing the patients care, assessment, plan and disposition with other care givers. >50% of the time was devoted counseling the patient about the risks and benefits of treatment/Discharge disposition and coordinating care.   SIGNED:   Burgess JAYSON Dare, MD  [facility] 06/28/2024, 4:13 PM   If 7PM-7AM, please contact night-coverage

## 2024-06-28 NOTE — Plan of Care (Signed)
  Problem: Education: Goal: Knowledge of General Education information will improve Description: Including pain rating scale, medication(s)/side effects and non-pharmacologic comfort measures Outcome: Not Progressing   Problem: Health Behavior/Discharge Planning: Goal: Ability to manage health-related needs will improve Outcome: Not Progressing   Problem: Clinical Measurements: Goal: Ability to maintain clinical measurements within normal limits will improve Outcome: Not Progressing Goal: Will remain free from infection Outcome: Not Progressing Goal: Diagnostic test results will improve Outcome: Not Progressing Goal: Respiratory complications will improve Outcome: Not Progressing Goal: Cardiovascular complication will be avoided Outcome: Not Progressing   Problem: Activity: Goal: Risk for activity intolerance will decrease Outcome: Not Progressing   Problem: Nutrition: Goal: Adequate nutrition will be maintained Outcome: Not Progressing   Problem: Coping: Goal: Level of anxiety will decrease Outcome: Not Progressing   Problem: Elimination: Goal: Will not experience complications related to bowel motility Outcome: Not Progressing Goal: Will not experience complications related to urinary retention Outcome: Not Progressing   Problem: Pain Managment: Goal: General experience of comfort will improve and/or be controlled Outcome: Not Progressing   Problem: Safety: Goal: Ability to remain free from injury will improve Outcome: Not Progressing   Problem: Skin Integrity: Goal: Risk for impaired skin integrity will decrease Outcome: Not Progressing   Problem: Education: Goal: Knowledge of the prescribed therapeutic regimen will improve Outcome: Not Progressing   Problem: Coping: Goal: Ability to identify and develop effective coping behavior will improve Outcome: Not Progressing   Problem: Clinical Measurements: Goal: Quality of life will improve Outcome: Not  Progressing   Problem: Respiratory: Goal: Verbalizations of increased ease of respirations will increase Outcome: Not Progressing   Problem: Role Relationship: Goal: Family's ability to cope with current situation will improve Outcome: Not Progressing Goal: Ability to verbalize concerns, feelings, and thoughts to partner or family member will improve Outcome: Not Progressing   Problem: Pain Management: Goal: Satisfaction with pain management regimen will improve Outcome: Not Progressing

## 2024-06-29 DIAGNOSIS — R6510 Systemic inflammatory response syndrome (SIRS) of non-infectious origin without acute organ dysfunction: Secondary | ICD-10-CM | POA: Diagnosis not present

## 2024-06-29 DIAGNOSIS — Z66 Do not resuscitate: Secondary | ICD-10-CM | POA: Diagnosis not present

## 2024-06-29 DIAGNOSIS — Z515 Encounter for palliative care: Secondary | ICD-10-CM | POA: Diagnosis not present

## 2024-06-29 DIAGNOSIS — Z7189 Other specified counseling: Secondary | ICD-10-CM | POA: Diagnosis not present

## 2024-06-29 DIAGNOSIS — R4589 Other symptoms and signs involving emotional state: Secondary | ICD-10-CM

## 2024-06-29 NOTE — Progress Notes (Signed)
 Nutrition Brief Note  Chart reviewed. Pt now transitioning to comfort care.  No further nutrition interventions planned at this time.  Please re-consult as needed.   Vernell Lukes, RD, LDN, CNSC Registered Dietitian II Please reach out via secure chat

## 2024-06-29 NOTE — Progress Notes (Signed)
 PROGRESS NOTE    Sabrina Hart  FMW:[id number] DOB: Apr 16, 1932 DOA: 06/18/2024 PCP: [name], MD    Brief Narrative:  89 year old F with PMH of dementia, CAD/stent, HTN, HLD, hypothyroidism, glaucoma, DVT on Eliquis  and chronic bilateral gluteal decubitus and right foot ulcer presenting with increased CPH/bloody drainage after starting Eliquis  for DVT, decline in mentation and activity 4 weeks, and admitted with severe sepsis due to infected decubitus ulcer, right hip abscess and osteomyelitis of first metatarsal head of the right foot.    CT abdomen pelvis showed crescentic abscess at the right hip measuring 12 x 2 x 12 cm, prominent stool favoring constipation, mesenteric edema, severe arthropathy of the hips possible synovitis in the right hip.    Patient received vancomycin , aztreonam  in the ED in the setting of penicillin allergy. Was also diagnosed with right foot abscess and osteomyelitis.  Orthopedic and general surgery consulted, and did not feel there is indication for I&D and recommended wound care and palliative care consult.  After meeting with palliative care, family decided to continue current scope of care with antibiotics.  ID consulted on 7/26, and escalated antibiotics to daptomycin , ceftriaxone  and Flagyl .   After goal of care discussion with patient's daughter, transition to full comfort care on 7/29.  TOC consulted for residential hospice placement.  At first daughter agreeable to be to place but later on 7/30 when she changed her mind.   Assessment & Plan:  Principal Problem:   SIRS (systemic inflammatory response syndrome) (HCC) Active Problems:   CAD (coronary artery disease)   Hypothyroidism   Hyperlipidemia   Severe late onset Alzheimer's dementia (HCC)   Essential hypertension   Primary open angle glaucoma (POAG) of both eyes   Pressure injury of right hip, unstageable (HCC)   Gangrene of right foot (HCC)   Pressure injury of skin     End-of-life care-transitioned to full comfort care on 7/29 after discussion with patient's daughter at bedside. - Comfort pathway with as needed medications-ordered. - Family interested in residential hospice.  TOC consulted for referral.   Severe sepsis due to infected decubitus ulcers, right hip abscess and right foot abscess and osteomyelitis: Present on admission.  Had tachypnea and leukocytosis and lactic acid of 3.7. -CT abdomen and pelvis as above. -MRI of right foot ulceration medial to the first MTP joint with underlying abscess and osteomyelitis of the first metatarsal head, its sesmoids and the base of first proximal phalanx -Evaluated by Dr. Stan to be not a candidate for endovascular evaluation due to her flexor contracture -Ortho and surgery did not feel I&D is indicated.  IR concurred with surgery and Ortho. -Surgery recommended hydrotherapy, WTD, air mattress and frequent turning and offloading. -Family expressed desire to continue current scope of care with antibiotics.  No tube feed. -Was Duricef and doxy.  ID consulted on 7/26 and escalated to daptomycin , CTX and Flagyl  -PICC line placed on 7/28 -Transitioned to full comfort care on 7/29.   Acute septic encephalopathy, POA: sleepy but wakes to voice.  Only mumbles words. Dementia without behavioral disturbance   Positive blood culture: Blood culture with Staphylococcus capitis in 1 out of 2 bottles likely contaminant   Right kidney lesion: Favoring a cyst but is indeterminant -MRI recommended for follow-up but doubt utility given patient's age and poor prognosis   Hypoglycemia: 47 initially, required dextrose  fluid.  Now on diet, sugar controlled.   Constipation: MiraLAX  and Fleet enema and Colace ordered.  Constipation resolved.   Hypertension  normotensive off home antihypertensive meds.   AKI: Likely ATN due to severe sepsis.  Resolved.   Hypokalemia   Mild hyponatremia: Resolved.   Normocytic  anemia: Stable.   Hyperlipidemia   CAD: Status post stents   Hypothyroidism - Continue home Synthroid    Glaucoma   History of DVT   Increased nutrient needs Body mass index is 19.37 kg/m. Nutrition Problem: Increased nutrient needs Etiology: wound healing Signs/Symptoms: estimated needs Interventions: Ensure Enlive (each supplement provides 350kcal and 20 grams of protein), MVI, Prostat   Consultants:  Orthopedic surgery General surgery Palliative medicine Infectious disease Interventional radiology   Procedures: None   Microbiology summarized: MRSA PCR screen nonreactive Blood culture with Staph capitis in 1 out of 2 bottles   DVT prophylaxis:  Patient is full comfort care   Code Status: DNR-comfort Family Communication:  Level of care: Telemetry Medical Status is: Inpatient Remains inpatient appropriate because: End-of-life care; safe dispo pending      Subjective:  Remains lethargic, frequent and low blood glucose. Very poor oral intake  Examination:  General exam: Appears calm and comfortable  Respiratory system: Clear to auscultation. Respiratory effort normal. Cardiovascular system: S1 & S2 heard, RRR. No JVD, murmurs, rubs, gallops or clicks. No pedal edema. Gastrointestinal system: Abdomen is nondistended, soft and nontender. No organomegaly or masses felt. Normal bowel sounds heard. Central nervous system: Sleepy, occasionally mumbles some words Extremities: Symmetric 4 x 5 power. Skin: No rashes, lesions or ulcers Psychiatry: No agitation                  Objective: Vitals:   06/28/24 0000 06/28/24 0738 06/28/24 2047 06/29/24 0744  BP: 125/79 134/72 136/73 (!) 148/62  Pulse: 79 84 96 62  Resp: 16 18 17 19   Temp: 97.6 F (36.4 C) (!) 97.4 F (36.3 C) 97.6 F (36.4 C) (!) 97.3 F (36.3 C)  TempSrc: Axillary Oral Oral   SpO2: 100% (!) 77% 100%   Weight:      Height:       No intake or output data in the 24 hours  ending 06/29/24 1047 Filed Weights   06/18/24 0936 06/18/24 1938  Weight: 52.2 kg 49.6 kg    Scheduled Meds:  Chlorhexidine  Gluconate Cloth  6 each Topical Daily   collagenase    Topical Daily   feeding supplement  237 mL Oral TID BM   levothyroxine   88 mcg Oral Daily   Continuous Infusions:  Nutritional status Signs/Symptoms: estimated needs Interventions: Ensure Enlive (each supplement provides 350kcal and 20 grams of protein), MVI, Prostat Body mass index is 19.37 kg/m.  Data Reviewed:   CBC: Recent Labs  Lab 06/23/24 0231 06/24/24 0248 06/25/24 0902 06/26/24 0306  WBC 4.7 3.9* 6.5 5.2  NEUTROABS 2.8  --   --   --   HGB 10.0* 9.7* 10.5* 9.5*  HCT 30.5* 29.3* 32.6* 29.4*  MCV 85.4 85.4 86.2 86.7  PLT 371 425* 498* 456*   Basic Metabolic Panel: Recent Labs  Lab 06/24/24 0248 06/24/24 0703 06/25/24 0902 06/28/24 0352  NA 139  --  139  --   K 2.9*  --  3.7  --   CL 105  --  107  --   CO2 24  --  22  --   GLUCOSE 86  --  79  --   BUN 15  --  10  --   CREATININE 0.53  --  0.55  --   CALCIUM  7.9*  --  7.9*  --  MG  --  1.9 1.9  --   PHOS  --   --  2.6 2.6   GFR: Estimated Creatinine Clearance: 36.6 mL/min (by C-G formula based on SCr of 0.55 mg/dL). Liver Function Tests: Recent Labs  Lab 06/25/24 0902  ALBUMIN 1.9*   No results for input(s): LIPASE, AMYLASE in the last 168 hours. No results for input(s): AMMONIA in the last 168 hours. Coagulation Profile: No results for input(s): INR, PROTIME in the last 168 hours. Cardiac Enzymes: Recent Labs  Lab 06/23/24 1646  CKTOTAL 16*   BNP (last 3 results) No results for input(s): PROBNP in the last 8760 hours. HbA1C: No results for input(s): HGBA1C in the last 72 hours. CBG: Recent Labs  Lab 06/27/24 2004 06/27/24 2050 06/27/24 2054 06/28/24 0016 06/28/24 2043  GLUCAP 58* 50* 94 72 63*   Lipid Profile: No results for input(s): CHOL, HDL, LDLCALC, TRIG, CHOLHDL,  LDLDIRECT in the last 72 hours. Thyroid  Function Tests: No results for input(s): TSH, T4TOTAL, FREET4, T3FREE, THYROIDAB in the last 72 hours. Anemia Panel: No results for input(s): VITAMINB12, FOLATE, FERRITIN, TIBC, IRON, RETICCTPCT in the last 72 hours. Sepsis Labs: No results for input(s): PROCALCITON, LATICACIDVEN in the last 168 hours.  No results found for this or any previous visit (from the past 240 hours).       Radiology Studies: No results found.         LOS: 11 days   Time spent= 35 mins    Burgess JAYSON Dare, MD [facility]  If 7PM-7AM, please contact night-coverage  06/29/2024, 10:47 AM

## 2024-06-29 NOTE — Progress Notes (Signed)
 Daily Progress Note   Date: 06/29/2024   Patient Name: Sabrina Hart  DOB: May 16, 1932  MRN: [id number]  Age / Sex: 89 y.o., female  Attending Physician: Caleen Burgess BROCKS, MD Primary Care Physician: [name], MD Admit Date: 06/18/2024 Length of Stay: 11 days  Reason for Follow-up: Establishing goals of care, Non pain symptom management, Pain control, and Terminal Care  Past Medical History:  Diagnosis Date   Anginal pain [facility])    1990's; 09/28/2012   Arthritis    very mild (09/28/2012)   Coronary artery disease 09/27/2012   s/p PCI of the RCA with DES with remote PCI in [place] 15 to 20 years ago. 2. cath 02/2013 patent mid RCA stent, 30% left main, 70% mid to distaal tanden 70% lesions x 3, 60% mid diag, 50% ostial LCx, 40% mid LCx.    Glaucoma    Hyperlipidemia    Hypothyroidism     Subjective:   Subjective: Chart Reviewed. Updates received. Patient Assessed. Created space and opportunity for patient  and family to explore thoughts and feelings regarding current medical situation.  Today's Discussion: Today before meeting with the patient/family, I reviewed the chart including TOC note from today, already come from today hospitalist note from yesterday.  Later in the day I reviewed the hospitalist note from today.  I also reviewed vitals, medication administration record, nursing flow sheets.  On review of her chart her vitals are temperature of 97.3, heart rate 62, respiratory rate 19, blood pressure 148/62, oxygen saturation 100% on room air.  She did not require symptom management meds although we will leave them available because of anticipated need and previous need.  Today I saw the patient at bedside, no family was present.  I observed her for some time and she appears comfortable.  She did not have any obvious objective signs of distress or discomfort.  Respirations are even and unlabored, respiratory rate and counted by myself at 12.  No grimacing, groaning in  discomfort,  After seeing the patient I called and spoke with her daughter 72.  I spent some time discussing grief and death allowing her daughter to express her feelings.  She states that she had her breakdown moment yesterday and we talked about the need for venting of a motion.  She shared a phrase in her culture which translates to the world is a Visual merchandiser but heaven is home.  She shares that they believe that we are here to shop and experience but eventually it is time to go to the checkout counter, settle the bill, and return home.  She notes that she and her family are very comfortable with death and understand it is a natural part of life.  However, we agreed that despite the acknowledgment of the inevitability of death that grief is inevitable and a normal part of the process.  She also shared their desired funeral home for when her mother passes which is [facility] in [place], phone # [phone].  I shared this information with the nurse.  I also shared daughter's request to be notified ASAP when her mother passes, if she is not present at that time.  She states that there is a cultural ritual and washing of the body that is important to them but they would like to perform.  Nurse checked on this information and acknowledged receipt.  I provided emotional and general support through therapeutic listening, empathy, sharing of stories, and other techniques. I answered all questions and  addressed all concerns to the best of my ability.  Review of Systems  Unable to perform ROS   Objective:   Primary Diagnoses: Present on Admission:  Hypothyroidism  Primary open angle glaucoma (POAG) of both eyes  Severe late onset Alzheimer's dementia (HCC)  Hyperlipidemia  Essential hypertension  CAD (coronary artery disease)  SIRS (systemic inflammatory response syndrome) (HCC)   Vital Signs:  BP (!) 148/62 (BP Location: Left Wrist)   Pulse 62   Temp (!) 97.3 F  (36.3 C)   Resp 19   Ht 5' 3 (1.6 m)   Wt 49.6 kg   SpO2 100%   BMI 19.37 kg/m   Physical Exam Vitals and nursing note reviewed.  Constitutional:      General: She is sleeping. She is not in acute distress.    Appearance: She is ill-appearing.     Comments: Appears frail  HENT:     Head: Normocephalic and atraumatic.  Pulmonary:     Effort: Pulmonary effort is normal. No respiratory distress.     Comments: RR 12, even unlabored Abdominal:     General: Abdomen is flat.  Skin:    General: Skin is warm and dry.  Neurological:     General: No focal deficit present.  Psychiatric:        Mood and Affect: Mood normal.        Behavior: Behavior normal.     Palliative Assessment/Data: 10%   Assessment & Plan:   HPI/Patient Profile:  89 y.o. female  with past medical history of hypertension, hyperlipidemia, hypothyroidism, CAD status post stent, dementia, glaucoma, DVT on Eliquis  presented with confusion, wound changes, constipation.  She was admitted on 06/18/2024 with SIRS, severe sepsis secondary to infected decubitus ulcers, right hip abscess, osteomyelitis of the first metatarsal head of the right foot, acute septic encephalopathy, constipation, AKI, and others.    Palliative medicine was consulted for GOC conversations.  SUMMARY OF RECOMMENDATIONS   DNR-comfort Continue comfort care See symptom management orders below Palliative medicine will continue to follow  Symptom Management:  Tylenol  650 mg PR every 6 hours as needed mild pain (1-3) or fever Artificial tears 1 drop OU 4 times daily as needed dry eyes Fentanyl  25 mcg IV every hour as needed severe pain (7-10) Robinul  0.2 mg IV every 4 hours as needed extend secretions Haldol  0.5 mg IV every 4 hours as needed agitation or delirium Ativan  1 mg sublingual or 0.5 mg IV every 4 hours as needed anxiety Zofran  4 mg IV every 6 hours as needed nausea or vomiting  Code Status: DNR-comfort  Prognosis: < 2  weeks  Discharge Planning: Anticipated Hospital Death  Discussed with: Medical team, nursing team, [facility] team, hospice liaison  Thank you for allowing us  to participate in the care of [facility] PMT will continue to support holistically.  Billing based on MDM: High  Problems Addressed: One acute or chronic illness or injury that poses a threat to life or bodily function  Risks: Parenteral controlled substances  Detailed review of medical records (labs, imaging, vital signs), medically appropriate exam, discussed with treatment team, counseling and education to patient, family, & staff, documenting clinical information, medication management, coordination of care  Camellia Kays, NP Palliative Medicine Team  Team Phone # [phone] (Nights/Weekends)  07/28/2021, 8:17 AM

## 2024-06-29 NOTE — TOC Progression Note (Signed)
 Transition of Care [facility]) - Progression Note    Patient Details  Name: Sabrina Hart MRN: [id number] Date of Birth: March 04, 1932  Transition of Care [facility]) CM/SW Contact  Rosaline JONELLE Joe, RN Phone Number: 06/29/2024, 9:05 AM  Clinical Narrative:    CM spoke with Dr. Caleen, attending physician and the family declined Inpatient Hospice placement yesterday evening due to the costs for inpatient hospice placement room and board costs.  Hospital death is expected and patient is not eating and drinking at this time.                     Expected Discharge Plan and Services         Expected Discharge Date: 06/28/24                                     Social Drivers of Health (SDOH) Interventions SDOH Screenings   Food Insecurity: No Food Insecurity (06/18/2024)  Housing: Low Risk  (06/18/2024)  Transportation Needs: No Transportation Needs (06/18/2024)  Utilities: Not At Risk (06/18/2024)  Depression (PHQ2-9): High Risk (03/09/2023)  Social Connections: Unknown (06/18/2024)  Tobacco Use: Low Risk  (06/18/2024)    Readmission Risk Interventions    06/26/2024    3:45 PM  Readmission Risk Prevention Plan  Transportation Screening Complete  PCP or Specialist Appt within 5-7 Days Complete  Home Care Screening Complete  Medication Review (RN CM) Complete

## 2024-06-29 NOTE — Progress Notes (Signed)
 Spoke with palliative team. Daughter Bisi called informing the funeral of choice is, [facility] Contact number: [phone]   Daughter Blanchie is to be contacted when passing of patient due to the respect of spiritual needs and care of patient's body after passing.

## 2024-06-30 DIAGNOSIS — Z7189 Other specified counseling: Secondary | ICD-10-CM | POA: Diagnosis not present

## 2024-06-30 DIAGNOSIS — Z515 Encounter for palliative care: Secondary | ICD-10-CM | POA: Diagnosis not present

## 2024-06-30 DIAGNOSIS — M160 Bilateral primary osteoarthritis of hip: Secondary | ICD-10-CM | POA: Diagnosis not present

## 2024-06-30 DIAGNOSIS — R6510 Systemic inflammatory response syndrome (SIRS) of non-infectious origin without acute organ dysfunction: Secondary | ICD-10-CM | POA: Diagnosis not present

## 2024-06-30 NOTE — Plan of Care (Signed)
 Comfort care

## 2024-06-30 NOTE — Progress Notes (Signed)
 Daily Progress Note   Date: 06/30/2024   Patient Name: Sabrina Hart  DOB: Jun 02, 1932  MRN: [id number]  Age / Sex: 89 y.o., female  Attending Physician: Caleen Burgess BROCKS, MD Primary Care Physician: [name], MD Admit Date: 06/18/2024 Length of Stay: 12 days  Reason for Follow-up: Establishing goals of care, Non pain symptom management, Pain control, and Terminal Care  Past Medical History:  Diagnosis Date   Anginal pain [facility])    1990's; 09/28/2012   Arthritis    very mild (09/28/2012)   Coronary artery disease 09/27/2012   s/p PCI of the RCA with DES with remote PCI in [place] 15 to 20 years ago. 2. cath 02/2013 patent mid RCA stent, 30% left main, 70% mid to distaal tanden 70% lesions x 3, 60% mid diag, 50% ostial LCx, 40% mid LCx.    Glaucoma    Hyperlipidemia    Hypothyroidism     Subjective:   Subjective: Chart Reviewed. Updates received. Patient Assessed. Created space and opportunity for patient  and family to explore thoughts and feelings regarding current medical situation.  Today's Discussion: Today before meeting with the patient/family, I reviewed the chart including hospitalist note from today, nursing note from today.  I also reviewed vital signs, MAR, nursing flowsheets.  Vital signs today include temperature 97.6 heart rate 65 respiratory rate 16 blood pressure 141/121 and satting 100% on room air.  She did not require symptom management meds although we will leave them available because of anticipated need and previous need.  Today I saw the patient at bedside, no family was present.  When I entered the room she was moving around in bed which may be questionable discomfort due to known hip abscess, osteomyelitis of her ankle, severe osteoarthritis and family's description of significant chronic pain.  Over the past several days she has been sleeping quietly without much movement.  During my visit I was joined by the hospitalist Dr. Caleen.  The patient awoke  and I attempted to talk to her, although her responses were not appropriate to the conversation and questions being asked.  Unable to communicate whether she is in pain.  I shared that she is being cared for and her daughter will be by later to visit with her which seem to make her smile.  After seeing the patient I discussed with Dr. Caleen and will request nurse to give a dose of as needed pain medicine due to presumption of discomfort with known history, no chronic pain, and change in her behavior this morning.  Requested nursing to provide a dose of pain medicine and she said she would.  I shared that I would request a colleague to check in on her tomorrow and Monday and that I was back on service Tuesday.  I provided emotional and general support through therapeutic listening, empathy, sharing of stories, and other techniques. I answered all questions and addressed all concerns to the best of my ability.  Review of Systems  Unable to perform ROS   Objective:   Primary Diagnoses: Present on Admission:  Hypothyroidism  Primary open angle glaucoma (POAG) of both eyes  Severe late onset Alzheimer's dementia (HCC)  Hyperlipidemia  Essential hypertension  CAD (coronary artery disease)  SIRS (systemic inflammatory response syndrome) (HCC)   Vital Signs:  BP (!) 141/121 (BP Location: Left Arm)   Pulse 65   Temp 97.6 F (36.4 C) (Oral)   Resp 16   Ht 5' 3 (1.6 m)   Wt  49.6 kg   SpO2 100%   BMI 19.37 kg/m   Physical Exam Vitals and nursing note reviewed.  Constitutional:      General: She is sleeping. She is not in acute distress.    Appearance: She is ill-appearing.     Comments: Appears frail  HENT:     Head: Normocephalic and atraumatic.  Pulmonary:     Effort: Pulmonary effort is normal. No respiratory distress.     Comments: RR 12, even unlabored Abdominal:     General: Abdomen is flat.  Skin:    General: Skin is warm and dry.  Neurological:     General: No focal  deficit present.  Psychiatric:        Mood and Affect: Mood normal.        Behavior: Behavior normal.     Palliative Assessment/Data: 10%   Assessment & Plan:   HPI/Patient Profile:  89 y.o. female  with past medical history of hypertension, hyperlipidemia, hypothyroidism, CAD status post stent, dementia, glaucoma, DVT on Eliquis  presented with confusion, wound changes, constipation.  She was admitted on 06/18/2024 with SIRS, severe sepsis secondary to infected decubitus ulcers, right hip abscess, osteomyelitis of the first metatarsal head of the right foot, acute septic encephalopathy, constipation, AKI, and others.    Palliative medicine was consulted for GOC conversations.  SUMMARY OF RECOMMENDATIONS   DNR-comfort Continue comfort care Request as needed dose of pain medicine from nursing See symptom management orders below Palliative medicine will continue to follow  Symptom Management:  Tylenol  650 mg PR every 6 hours as needed mild pain (1-3) or fever Artificial tears 1 drop OU 4 times daily as needed dry eyes Fentanyl  25 mcg IV every hour as needed severe pain (7-10) Robinul  0.2 mg IV every 4 hours as needed extend secretions Haldol  0.5 mg IV every 4 hours as needed agitation or delirium Ativan  1 mg sublingual or 0.5 mg IV every 4 hours as needed anxiety Zofran  4 mg IV every 6 hours as needed nausea or vomiting  Code Status: DNR-comfort  Prognosis: < 2 weeks  Discharge Planning: Anticipated Hospital Death  Discussed with: Medical team, nursing team, [facility] team, hospice liaison  Thank you for allowing us  to participate in the care of [facility] PMT will continue to support holistically.  Billing based on MDM: High  Problems Addressed: One acute or chronic illness or injury that poses a threat to life or bodily function  Risks: Parenteral controlled substances  Detailed review of medical records (labs, imaging, vital signs), medically appropriate exam,  discussed with treatment team, counseling and education to patient, family, & staff, documenting clinical information, medication management, coordination of care  Camellia Kays, NP Palliative Medicine Team  Team Phone # [phone] (Nights/Weekends)  07/28/2021, 8:17 AM

## 2024-06-30 NOTE — Progress Notes (Signed)
 PROGRESS NOTE    Sabrina Hart  FMW:[id number] DOB: [birth year] DOA: 06/18/2024 PCP: [name], MD    Brief Narrative:  89 year old F with PMH of dementia, CAD/stent, HTN, HLD, hypothyroidism, glaucoma, DVT on Eliquis  and chronic bilateral gluteal decubitus and right foot ulcer presenting with increased CPH/bloody drainage after starting Eliquis  for DVT, decline in mentation and activity 4 weeks, and admitted with severe sepsis due to infected decubitus ulcer, right hip abscess and osteomyelitis of first metatarsal head of the right foot.    CT abdomen pelvis showed crescentic abscess at the right hip measuring 12 x 2 x 12 cm, prominent stool favoring constipation, mesenteric edema, severe arthropathy of the hips possible synovitis in the right hip.    Patient received vancomycin , aztreonam  in the ED in the setting of penicillin allergy. Was also diagnosed with right foot abscess and osteomyelitis.  Orthopedic and general surgery consulted, and did not feel there is indication for I&D and recommended wound care and palliative care consult.  After meeting with palliative care, family decided to continue current scope of care with antibiotics.  ID consulted on 7/26, and escalated antibiotics to daptomycin , ceftriaxone  and Flagyl .   After goal of care discussion with patient's daughter, transition to full comfort care on 7/29.  TOC consulted for residential hospice placement.  At first daughter agreeable to be to place but later on 7/30 when she changed her mind.   Assessment & Plan:  Principal Problem:   SIRS (systemic inflammatory response syndrome) (HCC) Active Problems:   CAD (coronary artery disease)   Hypothyroidism   Hyperlipidemia   Severe late onset Alzheimer's dementia (HCC)   Essential hypertension   Primary open angle glaucoma (POAG) of both eyes   Pressure injury of right hip, unstageable (HCC)   Gangrene of right foot (HCC)   Pressure injury of skin     End-of-life care-transitioned to full comfort care on 7/29 after discussion with patient's daughter at bedside. - Comfort pathway with as needed medications-ordered. - Family interested in residential hospice.  TOC consulted for referral.   Severe sepsis due to infected decubitus ulcers, right hip abscess and right foot abscess and osteomyelitis: Present on admission.  Had tachypnea and leukocytosis and lactic acid of 3.7. -CT abdomen and pelvis as above. -MRI of right foot ulceration medial to the first MTP joint with underlying abscess and osteomyelitis of the first metatarsal head, its sesmoids and the base of first proximal phalanx -Evaluated by Dr. Stan to be not a candidate for endovascular evaluation due to her flexor contracture -Ortho and surgery did not feel I&D is indicated.  IR concurred with surgery and Ortho. -Surgery recommended hydrotherapy, WTD, air mattress and frequent turning and offloading. -Family expressed desire to continue current scope of care with antibiotics.  No tube feed. -Was Duricef and doxy.  ID consulted on 7/26 and escalated to daptomycin , CTX and Flagyl  -PICC line placed on 7/28 -Transitioned to full comfort care on 7/29.   Acute septic encephalopathy, POA: sleepy but wakes to voice.  Only mumbles words. Dementia without behavioral disturbance   Positive blood culture: Blood culture with Staphylococcus capitis in 1 out of 2 bottles likely contaminant   Right kidney lesion: Favoring a cyst but is indeterminant -MRI recommended for follow-up but doubt utility given patient's age and poor prognosis   Hypoglycemia: 47 initially, required dextrose  fluid.  Now on diet, sugar controlled.   Constipation: MiraLAX  and Fleet enema and Colace ordered.  Constipation resolved.   Hypertension  normotensive off home antihypertensive meds.   AKI: Likely ATN due to severe sepsis.  Resolved.   Hypokalemia   Mild hyponatremia: Resolved.   Normocytic  anemia: Stable.   Hyperlipidemia   CAD: Status post stents   Hypothyroidism Discontinue home medications   Glaucoma   History of DVT   Increased nutrient needs Body mass index is 19.37 kg/m. Nutrition Problem: Increased nutrient needs Etiology: wound healing Signs/Symptoms: estimated needs Interventions: Ensure Enlive (each supplement provides 350kcal and 20 grams of protein), MVI, Prostat   Consultants:  Orthopedic surgery General surgery Palliative medicine Infectious disease Interventional radiology   Procedures: None   Microbiology summarized: MRSA PCR screen nonreactive Blood culture with Staph capitis in 1 out of 2 bottles   DVT prophylaxis: Patient is full comfort care   Code Status: DNR-comfort Family Communication: Attempted to call daughter but no response Status is: Inpatient Remains inpatient appropriate because: End-of-life care; safe dispo pending      Subjective: Minimal interaction no complaint. Still having hip pain  Examination:  General exam: Appears calm and comfortable  Respiratory system: Clear to auscultation. Respiratory effort normal. Cardiovascular system: S1 & S2 heard, RRR. No JVD, murmurs, rubs, gallops or clicks. No pedal edema. Gastrointestinal system: Abdomen is nondistended, soft and nontender. No organomegaly or masses felt. Normal bowel sounds heard. Central nervous system: Sleepy, occasionally mumbles some words Extremities: Symmetric 4 x 5 power. Skin: No rashes, lesions or ulcers Psychiatry: No agitation                  Objective: Vitals:   06/29/24 0744 06/29/24 1943 06/30/24 0523 06/30/24 0929  BP: (!) 148/62 (!) 159/126 (!) 142/69 (!) 141/121  Pulse: 62 95 89 65  Resp: 19 15 15 16   Temp: (!) 97.3 F (36.3 C) 98 F (36.7 C) 98 F (36.7 C) 97.6 F (36.4 C)  TempSrc:    Oral  SpO2:  (!) 82% 100%   Weight:      Height:        Intake/Output Summary (Last 24 hours) at 06/30/2024 1033 Last  data filed at 06/29/2024 1803 Gross per 24 hour  Intake --  Output 100 ml  Net -100 ml   Filed Weights   06/18/24 0936 06/18/24 1938  Weight: 52.2 kg 49.6 kg    Scheduled Meds:  Chlorhexidine  Gluconate Cloth  6 each Topical Daily   collagenase    Topical Daily   feeding supplement  237 mL Oral TID BM   Continuous Infusions:  Nutritional status Signs/Symptoms: estimated needs Interventions: Ensure Enlive (each supplement provides 350kcal and 20 grams of protein), MVI, Prostat Body mass index is 19.37 kg/m.  Data Reviewed:   CBC: Recent Labs  Lab 06/24/24 0248 06/25/24 0902 06/26/24 0306  WBC 3.9* 6.5 5.2  HGB 9.7* 10.5* 9.5*  HCT 29.3* 32.6* 29.4*  MCV 85.4 86.2 86.7  PLT 425* 498* 456*   Basic Metabolic Panel: Recent Labs  Lab 06/24/24 0248 06/24/24 0703 06/25/24 0902 06/28/24 0352  NA 139  --  139  --   K 2.9*  --  3.7  --   CL 105  --  107  --   CO2 24  --  22  --   GLUCOSE 86  --  79  --   BUN 15  --  10  --   CREATININE 0.53  --  0.55  --   CALCIUM  7.9*  --  7.9*  --   MG  --  1.9 1.9  --  PHOS  --   --  2.6 2.6   GFR: Estimated Creatinine Clearance: 36.6 mL/min (by C-G formula based on SCr of 0.55 mg/dL). Liver Function Tests: Recent Labs  Lab 06/25/24 0902  ALBUMIN 1.9*   No results for input(s): LIPASE, AMYLASE in the last 168 hours. No results for input(s): AMMONIA in the last 168 hours. Coagulation Profile: No results for input(s): INR, PROTIME in the last 168 hours. Cardiac Enzymes: Recent Labs  Lab 06/23/24 1646  CKTOTAL 16*   BNP (last 3 results) No results for input(s): PROBNP in the last 8760 hours. HbA1C: No results for input(s): HGBA1C in the last 72 hours. CBG: Recent Labs  Lab 06/27/24 2004 06/27/24 2050 06/27/24 2054 06/28/24 0016 06/28/24 2043  GLUCAP 58* 50* 94 72 63*   Lipid Profile: No results for input(s): CHOL, HDL, LDLCALC, TRIG, CHOLHDL, LDLDIRECT in the last 72 hours. Thyroid   Function Tests: No results for input(s): TSH, T4TOTAL, FREET4, T3FREE, THYROIDAB in the last 72 hours. Anemia Panel: No results for input(s): VITAMINB12, FOLATE, FERRITIN, TIBC, IRON, RETICCTPCT in the last 72 hours. Sepsis Labs: No results for input(s): PROCALCITON, LATICACIDVEN in the last 168 hours.  No results found for this or any previous visit (from the past 240 hours).       Radiology Studies: No results found.         LOS: 12 days   Time spent= 35 mins    Burgess JAYSON Dare, MD [facility]  If 7PM-7AM, please contact night-coverage  06/30/2024, 10:33 AM

## 2024-07-01 DIAGNOSIS — R6510 Systemic inflammatory response syndrome (SIRS) of non-infectious origin without acute organ dysfunction: Secondary | ICD-10-CM | POA: Diagnosis not present

## 2024-07-01 DIAGNOSIS — Z515 Encounter for palliative care: Secondary | ICD-10-CM | POA: Diagnosis not present

## 2024-07-01 LAB — GLUCOSE, CAPILLARY: Glucose-Capillary: 73 mg/dL (ref 70–99)

## 2024-07-01 NOTE — Progress Notes (Signed)
 PROGRESS NOTE    Sabrina Hart  FMW:[id number] DOB: 05/11/1932 DOA: 06/18/2024 PCP: [name], MD    Brief Narrative:  89 year old F with PMH of dementia, CAD/stent, HTN, HLD, hypothyroidism, glaucoma, DVT on Eliquis  and chronic bilateral gluteal decubitus and right foot ulcer presenting with increased CPH/bloody drainage after starting Eliquis  for DVT, decline in mentation and activity 4 weeks, and admitted with severe sepsis due to infected decubitus ulcer, right hip abscess and osteomyelitis of first metatarsal head of the right foot.    CT abdomen pelvis showed crescentic abscess at the right hip measuring 12 x 2 x 12 cm, prominent stool favoring constipation, mesenteric edema, severe arthropathy of the hips possible synovitis in the right hip.    Patient received vancomycin , aztreonam  in the ED in the setting of penicillin allergy. Was also diagnosed with right foot abscess and osteomyelitis.  Orthopedic and general surgery consulted, and did not feel there is indication for I&D and recommended wound care and palliative care consult.  After meeting with palliative care, family decided to continue current scope of care with antibiotics.  ID consulted on 7/26, and escalated antibiotics to daptomycin , ceftriaxone  and Flagyl .   After goal of care discussion with patient's daughter, transition to full comfort care on 7/29.  TOC consulted for residential hospice placement.  At first daughter agreeable to be to place but later on 7/30 when she changed her mind.   Assessment & Plan:  Principal Problem:   SIRS (systemic inflammatory response syndrome) (HCC) Active Problems:   CAD (coronary artery disease)   Hypothyroidism   Hyperlipidemia   Severe late onset Alzheimer's dementia (HCC)   Essential hypertension   Primary open angle glaucoma (POAG) of both eyes   Pressure injury of right hip, unstageable (HCC)   Gangrene of right foot (HCC)   Pressure injury of skin     End-of-life care-transitioned to full comfort care on 7/29 after discussion with patient's daughter at bedside. - Comfort pathway with as needed medications-ordered. - Family interested in residential hospice.  TOC consulted for referral.   Severe sepsis due to infected decubitus ulcers, right hip abscess and right foot abscess and osteomyelitis: Present on admission.  Had tachypnea and leukocytosis and lactic acid of 3.7. -CT abdomen and pelvis as above. -MRI of right foot ulceration medial to the first MTP joint with underlying abscess and osteomyelitis of the first metatarsal head, its sesmoids and the base of first proximal phalanx -Evaluated by Dr. Stan to be not a candidate for endovascular evaluation due to her flexor contracture -Ortho and surgery did not feel I&D is indicated.  IR concurred with surgery and Ortho. -Surgery recommended hydrotherapy, WTD, air mattress and frequent turning and offloading. -Family expressed desire to continue current scope of care with antibiotics.  No tube feed. -Was Duricef and doxy.  ID consulted on 7/26 and escalated to daptomycin , CTX and Flagyl  -PICC line placed on 7/28 -Transitioned to full comfort care on 7/29.   Acute septic encephalopathy, POA: sleepy but wakes to voice.  Only mumbles words. Dementia without behavioral disturbance   Positive blood culture: Blood culture with Staphylococcus capitis in 1 out of 2 bottles likely contaminant   Right kidney lesion: Favoring a cyst but is indeterminant -MRI recommended for follow-up but doubt utility given patient's age and poor prognosis   Hypoglycemia: 47 initially, required dextrose  fluid.  Now on diet, sugar controlled.   Constipation: MiraLAX  and Fleet enema and Colace ordered.  Constipation resolved.   Hypertension  normotensive off home antihypertensive meds.   AKI: Likely ATN due to severe sepsis.  Resolved.   Hypokalemia   Mild hyponatremia: Resolved.   Normocytic  anemia: Stable.   Hyperlipidemia   CAD: Status post stents   Hypothyroidism Discontinue home medications   Glaucoma   History of DVT   Increased nutrient needs Body mass index is 19.37 kg/m. Nutrition Problem: Increased nutrient needs Etiology: wound healing Signs/Symptoms: estimated needs Interventions: Ensure Enlive (each supplement provides 350kcal and 20 grams of protein), MVI, Prostat   Consultants:  Orthopedic surgery General surgery Palliative medicine Infectious disease Interventional radiology   Procedures: None   Microbiology summarized: MRSA PCR screen nonreactive Blood culture with Staph capitis in 1 out of 2 bottles   DVT prophylaxis: Patient is full comfort care   Code Status: DNR-comfort Family Communication: Attempted to call daughter but no response Status is: Inpatient Remains inpatient appropriate because: End-of-life care; safe dispo pending      Subjective: Minimal interaction Resting this morning.  Does not appear to be any acute distress  Examination:  General exam: Elderly frail cachectic Respiratory system: Clear to auscultation. Respiratory effort normal. Cardiovascular system: S1 & S2 heard, RRR. No JVD, murmurs, rubs, gallops or clicks. No pedal edema. Gastrointestinal system: Abdomen is nondistended, soft and nontender. No organomegaly or masses felt. Normal bowel sounds heard. Central nervous system: Sleepy, occasionally mumbles some words Extremities: Symmetric 4 x 5 power. Skin: No rashes, lesions or ulcers Psychiatry: No agitation                  Objective: Vitals:   06/30/24 1646 06/30/24 1956 07/01/24 0219 07/01/24 0225  BP: (!) 114/42 (!) 116/49 (!) 97/48 (!) 97/48  Pulse: 78 81 69 69  Resp: 16 18 16 16   Temp: 97.6 F (36.4 C) 97.9 F (36.6 C) 97.9 F (36.6 C) 97.9 F (36.6 C)  TempSrc:    Oral  SpO2: 97% 99% 100% 99%  Weight:      Height:        Intake/Output Summary (Last 24 hours) at  07/01/2024 1030 Last data filed at 07/01/2024 0900 Gross per 24 hour  Intake 0 ml  Output --  Net 0 ml   Filed Weights   06/18/24 0936 06/18/24 1938  Weight: 52.2 kg 49.6 kg    Scheduled Meds:  Chlorhexidine  Gluconate Cloth  6 each Topical Daily   collagenase    Topical Daily   feeding supplement  237 mL Oral TID BM   Continuous Infusions:  Nutritional status Signs/Symptoms: estimated needs Interventions: Ensure Enlive (each supplement provides 350kcal and 20 grams of protein), MVI, Prostat Body mass index is 19.37 kg/m.  Data Reviewed:   CBC: Recent Labs  Lab 06/25/24 0902 06/26/24 0306  WBC 6.5 5.2  HGB 10.5* 9.5*  HCT 32.6* 29.4*  MCV 86.2 86.7  PLT 498* 456*   Basic Metabolic Panel: Recent Labs  Lab 06/25/24 0902 06/28/24 0352  NA 139  --   K 3.7  --   CL 107  --   CO2 22  --   GLUCOSE 79  --   BUN 10  --   CREATININE 0.55  --   CALCIUM  7.9*  --   MG 1.9  --   PHOS 2.6 2.6   GFR: Estimated Creatinine Clearance: 36.6 mL/min (by C-G formula based on SCr of 0.55 mg/dL). Liver Function Tests: Recent Labs  Lab 06/25/24 0902  ALBUMIN 1.9*   No results for input(s): LIPASE, AMYLASE in  the last 168 hours. No results for input(s): AMMONIA in the last 168 hours. Coagulation Profile: No results for input(s): INR, PROTIME in the last 168 hours. Cardiac Enzymes: No results for input(s): CKTOTAL, CKMB, CKMBINDEX, TROPONINI in the last 168 hours. BNP (last 3 results) No results for input(s): PROBNP in the last 8760 hours. HbA1C: No results for input(s): HGBA1C in the last 72 hours. CBG: Recent Labs  Lab 06/27/24 2004 06/27/24 2050 06/27/24 2054 06/28/24 0016 06/28/24 2043  GLUCAP 58* 50* 94 72 63*   Lipid Profile: No results for input(s): CHOL, HDL, LDLCALC, TRIG, CHOLHDL, LDLDIRECT in the last 72 hours. Thyroid  Function Tests: No results for input(s): TSH, T4TOTAL, FREET4, T3FREE, THYROIDAB in the last  72 hours. Anemia Panel: No results for input(s): VITAMINB12, FOLATE, FERRITIN, TIBC, IRON, RETICCTPCT in the last 72 hours. Sepsis Labs: No results for input(s): PROCALCITON, LATICACIDVEN in the last 168 hours.  No results found for this or any previous visit (from the past 240 hours).       Radiology Studies: No results found.         LOS: 13 days   Time spent= 35 mins    Burgess JAYSON Dare, MD [facility]  If 7PM-7AM, please contact night-coverage  07/01/2024, 10:30 AM

## 2024-07-01 NOTE — Plan of Care (Signed)
  Problem: Pain Managment: Goal: General experience of comfort will improve and/or be controlled Outcome: Progressing

## 2024-07-01 NOTE — Progress Notes (Signed)
 Daily Progress Note   Patient Name: Sabrina Hart       Date: 07/01/2024 DOB: [birth year]  Age: 89 y.o. MRN#: [id number] Attending Physician: Caleen Burgess BROCKS, MD Primary Care Physician: [name], MD Admit Date: 06/18/2024  Reason for Consultation/Follow-up: Establishing goals of care   Length of Stay: 13  Current Medications: Scheduled Meds:   Chlorhexidine  Gluconate Cloth  6 each Topical Daily   collagenase    Topical Daily   feeding supplement  237 mL Oral TID BM    Continuous Infusions:   PRN Meds: acetaminophen  **OR** acetaminophen , artificial tears, fentaNYL  (SUBLIMAZE ) injection, glycopyrrolate  **OR** glycopyrrolate  **OR** glycopyrrolate , haloperidol  **OR** haloperidol  **OR** haloperidol  lactate, ipratropium-albuterol , LORazepam  **OR** LORazepam  **OR** LORazepam , ondansetron  **OR** ondansetron  (ZOFRAN ) IV  Physical Exam Vitals reviewed.  Constitutional:      General: She is not in acute distress.    Appearance: She is ill-appearing.  HENT:     Head: Normocephalic and atraumatic.  Pulmonary:     Effort: Pulmonary effort is normal.  Skin:    General: Skin is warm and dry.  Neurological:     Mental Status: She is alert.             Vital Signs: BP (!) 97/48 (BP Location: Right Leg)   Pulse 69   Temp 97.9 F (36.6 C) (Oral)   Resp 16   Ht 5' 3 (1.6 m)   Wt 49.6 kg   SpO2 99%   BMI 19.37 kg/m  SpO2: SpO2: 99 % O2 Device: O2 Device: Room Air O2 Flow Rate:    Palliative Assessment/Data: 10%      Patient Active Problem List   Diagnosis Date Noted   Pressure injury of skin 06/19/2024   SIRS (systemic inflammatory response syndrome) (HCC) 06/18/2024   Pressure injury of right hip, unstageable (HCC) 06/18/2024   Gangrene of right foot (HCC)  06/18/2024   Bilateral primary osteoarthritis of hip 08/28/2023   Essential hypertension 08/28/2023   Primary open angle glaucoma (POAG) of both eyes 08/28/2023   Severe late onset Alzheimer's dementia (HCC) 04/12/2019   Lower extremity edema 04/26/2017   Unstable angina (HCC) 03/02/2013   Hypothyroidism 09/29/2012   Hyperlipidemia 09/29/2012   Hypokalemia 09/29/2012   CAD (coronary artery disease) 09/27/2012    Palliative Care Assessment & Plan   Patient Profile:  89 y.o. female  with past medical history of hypertension, hyperlipidemia, hypothyroidism, CAD status post stent, dementia, glaucoma, DVT on Eliquis  presented with confusion, wound changes, constipation.  She was admitted on 06/18/2024 with SIRS, severe sepsis secondary to infected decubitus ulcers, right hip abscess, osteomyelitis of the first metatarsal head of the right foot, acute septic encephalopathy, constipation, AKI, and others.    Palliative medicine was consulted for GOC conversations.  Today's Discussion: Reviewed chart. Patient received prn fentanyl , haldol , and ativan  over the last 24 hours.  Patient awake lying in bed in no apparent distress. She appeared comfortable. I asked her if she was comfortable and she spoke but I could not understand her response. I shared our goal was to keep her comfortable and that we had many tools to do so. No family at bedside.  PMT will continue to follow  Recommendations/Plan: DNR-comfort Continue comfort care See symptom management orders below Palliative medicine will continue to follow   Symptom Management:  Tylenol  650 mg PR every 6 hours as needed mild pain (1-3) or fever Artificial tears 1 drop OU 4 times daily as needed dry eyes Fentanyl  25 mcg IV every hour as needed severe pain (7-10) Robinul  0.2 mg IV every 4 hours as needed extend secretions Haldol  0.5 mg IV every 4 hours as needed agitation or delirium Ativan  1 mg sublingual or 0.5 mg IV every 4 hours as  needed anxiety Zofran  4 mg IV every 6 hours as needed nausea or vomiting  Code Status:    Code Status Orders  (From admission, onward)           Start     Ordered   06/26/24 1534  Do not attempt resuscitation (DNR) - Comfort care  Continuous       Question Answer Comment  If patient has no pulse and is not breathing Do Not Attempt Resuscitation   In Pre-Arrest Conditions (Patient Is Breathing and Has a Pulse) Provide comfort measures. Relieve any mechanical airway obstruction. Avoid transfer unless required for comfort.   Consent: Discussion documented in EHR or advanced directives reviewed      06/26/24 1536            Extensive chart review has been completed prior to seeing the patient including vital signs, progress/consult notes, orders, medications, and available advance directive documents.  Time spent: 25 minutes  Thank you for allowing the Palliative Medicine Team to assist in the care of this patient.    Stephane CHRISTELLA Palin, NP  Please contact Palliative Medicine Team phone at [phone] for questions and concerns.

## 2024-07-02 DIAGNOSIS — M160 Bilateral primary osteoarthritis of hip: Secondary | ICD-10-CM

## 2024-07-02 DIAGNOSIS — L8990 Pressure ulcer of unspecified site, unspecified stage: Secondary | ICD-10-CM | POA: Diagnosis not present

## 2024-07-02 DIAGNOSIS — R6510 Systemic inflammatory response syndrome (SIRS) of non-infectious origin without acute organ dysfunction: Secondary | ICD-10-CM | POA: Diagnosis not present

## 2024-07-02 DIAGNOSIS — Z5189 Encounter for other specified aftercare: Secondary | ICD-10-CM

## 2024-07-02 DIAGNOSIS — L03317 Cellulitis of buttock: Secondary | ICD-10-CM | POA: Diagnosis not present

## 2024-07-02 MED ORDER — ENSURE PLUS HIGH PROTEIN PO LIQD: 237.0000 mL | ORAL | Status: DC | PRN

## 2024-07-02 NOTE — Progress Notes (Signed)
 Daily Progress Note   Patient Name: Sabrina Hart       Date: 07/02/2024 DOB: 1932/08/12  Age: 89 y.o. MRN#: [id number] Attending Physician: Caleen Burgess BROCKS, MD Primary Care Physician: [name], MD Admit Date: 06/18/2024  Reason for Consultation/Follow-up: Non pain symptom management, Pain control, Psychosocial/spiritual support, and Terminal Care  Subjective: I have reviewed medical records including EPIC notes, MAR, any available advanced directives as necessary, and labs. Received report from primary RN - no acute concerns.   Went to visit patient at bedside - no family/visitors present. Patient was lying in bed awake, alert, disoriented, and unable to participate in meaningful conversation. Her voice is soft and hard to hear. No signs or non-verbal gestures of pain or discomfort noted. No respiratory distress, increased work of breathing, or secretions noted. She denies pain. She is weak and frail appearing.  Discussed possible financial assistance for [employer] with hospice liaison - this was previously offered to family.    Length of Stay: 14  Current Medications: Scheduled Meds:   collagenase    Topical Daily    Continuous Infusions:   PRN Meds: acetaminophen  **OR** acetaminophen , artificial tears, feeding supplement, fentaNYL  (SUBLIMAZE ) injection, glycopyrrolate  **OR** glycopyrrolate  **OR** glycopyrrolate , haloperidol  **OR** haloperidol  **OR** haloperidol  lactate, ipratropium-albuterol , LORazepam  **OR** LORazepam  **OR** LORazepam , ondansetron  **OR** ondansetron  (ZOFRAN ) IV  Physical Exam Vitals and nursing note reviewed.  Constitutional:      General: She is not in acute distress. Pulmonary:     Effort: No respiratory distress.  Skin:    General: Skin is warm and  dry.  Neurological:     Mental Status: She is disoriented and confused.     Motor: Weakness present.  Psychiatric:        Behavior: Behavior is cooperative.        Cognition and Memory: Cognition is impaired. Memory is impaired.             Vital Signs: BP 126/68 (BP Location: Left Leg)   Pulse 75   Temp 97.9 F (36.6 C)   Resp 18   Ht 5' 3 (1.6 m)   Wt 49.6 kg   SpO2 100%   BMI 19.37 kg/m  SpO2: SpO2: 100 % O2 Device: O2 Device: Room Air O2 Flow Rate:    Intake/output summary:  Intake/Output Summary (Last 24 hours)  at 07/02/2024 1402 Last data filed at 07/01/2024 2000 Gross per 24 hour  Intake --  Output 250 ml  Net -250 ml   LBM: Last BM Date : 06/27/24 Baseline Weight: Weight: 52.2 kg Most recent weight: Weight: 49.6 kg       Palliative Assessment/Data: PPS 10%      Patient Active Problem List   Diagnosis Date Noted   Pressure injury of skin 06/19/2024   SIRS (systemic inflammatory response syndrome) (HCC) 06/18/2024   Pressure injury of right hip, unstageable (HCC) 06/18/2024   Gangrene of right foot (HCC) 06/18/2024   Bilateral primary osteoarthritis of hip 08/28/2023   Essential hypertension 08/28/2023   Primary open angle glaucoma (POAG) of both eyes 08/28/2023   Severe late onset Alzheimer's dementia (HCC) 04/12/2019   Lower extremity edema 04/26/2017   Unstable angina (HCC) 03/02/2013   Hypothyroidism 09/29/2012   Hyperlipidemia 09/29/2012   Hypokalemia 09/29/2012   CAD (coronary artery disease) 09/27/2012    Palliative Care Assessment & Plan   Patient Profile: 89 y.o. female with past medical history of hypertension, hyperlipidemia, hypothyroidism, CAD status post stent, dementia, glaucoma, DVT on Eliquis  presented with confusion, wound changes, constipation. She was admitted on 06/18/2024 with SIRS, severe sepsis secondary to infected decubitus ulcers, right hip abscess, osteomyelitis of the first metatarsal head of the right foot, acute septic  encephalopathy, constipation, AKI, and others.   Assessment: Principal Problem:   SIRS (systemic inflammatory response syndrome) (HCC) Active Problems:   CAD (coronary artery disease)   Hypothyroidism   Hyperlipidemia   Severe late onset Alzheimer's dementia (HCC)   Essential hypertension   Primary open angle glaucoma (POAG) of both eyes   Pressure injury of right hip, unstageable (HCC)   Gangrene of right foot (HCC)   Pressure injury of skin   Terminal care  Recommendations/Plan: Continue full comfort measures Continue DNR/DNI as previously documented Daughter unable to pay OOP cost for [employer] - anticipate hospital death Continue current comfort focused medication regimen - no changes PMT will continue to follow and support holistically  Symptom Management Fentanyl  PRN pain/dyspnea/increased work of breathing/RR>25 Tylenol  PRN pain/fever Robinul  PRN secretions Haldol  PRN agitation/delirium Ativan  PRN anxiety/seizure/sleep/distress Zofran  PRN nausea/vomiting Liquifilm Tears PRN dry eye   Goals of Care and Additional Recommendations: Limitations on Scope of Treatment: Full Comfort Care  Code Status:    Code Status Orders  (From admission, onward)           Start     Ordered   06/26/24 1534  Do not attempt resuscitation (DNR) - Comfort care  Continuous       Question Answer Comment  If patient has no pulse and is not breathing Do Not Attempt Resuscitation   In Pre-Arrest Conditions (Patient Is Breathing and Has a Pulse) Provide comfort measures. Relieve any mechanical airway obstruction. Avoid transfer unless required for comfort.   Consent: Discussion documented in EHR or advanced directives reviewed      06/26/24 1536           Code Status History     Date Active Date Inactive Code Status Order ID Comments User Context   06/18/2024 1345 06/26/2024 1536 Limited: Do not attempt resuscitation (DNR) -DNR-LIMITED -Do Not Intubate/DNI  [id number]  Seena Marsa NOVAK, MD ED   06/18/2024 1335 06/18/2024 1344 Full Code [id number]  Seena Marsa NOVAK, MD ED   10/03/2018 2138 10/04/2018 1830 Full Code [id number]  Dean Clarity, MD ED      Advance Directive Documentation  Flowsheet Row Most Recent Value  Type of Advance Directive Healthcare Power of Attorney  Pre-existing out of facility DNR order (yellow form or pink MOST form) --  MOST Form in Place? --    Prognosis:  < 2 weeks  Discharge Planning: Anticipated Hospital Death  Care plan was discussed with primary RN, hospice liaison, TOC  Thank you for allowing the Palliative Medicine Team to assist in the care of this patient.     Jeoffrey CHRISTELLA Sharps, NP  Please contact Palliative Medicine Team phone at [phone] for questions and concerns.   *Portions of this note are a verbal dictation therefore any spelling and/or grammatical errors are due to the Dragon [employer] One system interpretation.

## 2024-07-02 NOTE — Plan of Care (Signed)
  Problem: Education: Goal: Knowledge of General Education information will improve Description: Including pain rating scale, medication(s)/side effects and non-pharmacologic comfort measures Outcome: Progressing   Problem: Health Behavior/Discharge Planning: Goal: Ability to manage health-related needs will improve Outcome: Progressing   Problem: Pain Managment: Goal: General experience of comfort will improve and/or be controlled Outcome: Progressing

## 2024-07-02 NOTE — Progress Notes (Signed)
 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    CT abdomen pelvis showed crescentic abscess at the right hip measuring 12 x 2 x 12 cm, prominent stool favoring constipation, mesenteric edema, severe arthropathy of the hips possible synovitis in the right hip.    Patient received vancomycin , aztreonam  in the ED in the setting of penicillin allergy. Was also diagnosed with right foot abscess and osteomyelitis.  Orthopedic and general surgery consulted, and did not feel there is indication for I&D and recommended wound care and palliative care consult.  After meeting with palliative care, family decided to continue current scope of care with antibiotics.  ID consulted on 7/26, and escalated antibiotics to daptomycin , ceftriaxone  and Flagyl .   After goal of care discussion with patient's daughter, transition to full comfort care on 7/29.  TOC consulted for residential hospice placement.  At first daughter agreeable to be to place but later on 7/30 when she changed her mind.   Assessment & Plan:  Principal Problem:   SIRS (systemic inflammatory response syndrome) (HCC) Active Problems:   CAD (coronary artery disease)   Hypothyroidism   Hyperlipidemia   Severe late onset Alzheimer's dementia (HCC)   Essential hypertension   Primary open angle glaucoma (POAG) of both eyes   Pressure injury of right hip, unstageable (HCC)   Gangrene of right foot (HCC)   Pressure injury of skin     End-of-life care-transitioned to full comfort care on 7/29 after discussion with patient's daughter at bedside. - Comfort pathway with as needed medications-ordered. - Family interested in residential hospice.  TOC consulted for referral.   Severe sepsis due to infected decubitus ulcers, right hip abscess and right foot abscess and osteomyelitis: Present on admission.  Had tachypnea and leukocytosis and lactic acid of 3.7. -CT abdomen and pelvis as above. -MRI of right foot ulceration medial to the first MTP joint with underlying abscess and osteomyelitis of the first metatarsal head, its sesmoids and the base of first proximal phalanx -Evaluated by Dr. Stan to be not a candidate for endovascular evaluation due to her flexor contracture -Ortho and surgery did not feel I&D is indicated.  IR concurred with surgery and Ortho. -Surgery recommended hydrotherapy, WTD, air mattress and frequent turning and offloading. -Family expressed desire to continue current scope of care with antibiotics.  No tube feed. -Was Duricef and doxy.  ID consulted on 7/26 and escalated to daptomycin , CTX and Flagyl  -PICC line placed on 7/28 -Transitioned to full comfort care on 7/29.   Acute septic encephalopathy, POA: sleepy but wakes to voice.  Only mumbles words. Dementia without behavioral disturbance   Positive blood culture: Blood culture with Staphylococcus capitis in 1 out of 2 bottles likely contaminant   Right kidney lesion: Favoring a cyst but is indeterminant -MRI recommended for follow-up but doubt utility given patient's age and poor prognosis   Hypoglycemia: 47 initially, required dextrose  fluid.  Now on diet, sugar controlled.   Constipation: MiraLAX  and Fleet enema and Colace ordered.  Constipation resolved.   Hypertension  normotensive off home antihypertensive meds.   AKI: Likely ATN due to severe sepsis.  Resolved.   Hypokalemia   Mild hyponatremia: Resolved.   Normocytic  anemia: Stable.   Hyperlipidemia   CAD: Status post stents   Hypothyroidism Discontinue home medications   Glaucoma   History of DVT   Increased nutrient needs Body mass index is 19.37 kg/m. Nutrition Problem: Increased nutrient needs Etiology: wound healing Signs/Symptoms: estimated needs Interventions: Ensure Enlive (each supplement provides 350kcal and 20 grams of protein), MVI, Prostat   Consultants:  Orthopedic surgery General surgery Palliative medicine Infectious disease Interventional radiology   Procedures: None   Microbiology summarized: MRSA PCR screen nonreactive Blood culture with Staph capitis in 1 out of 2 bottles   DVT prophylaxis: Patient is full comfort care   Code Status: DNR-comfort Family Communication: Attempted to call daughter but no response Status is: Inpatient Remains inpatient appropriate because: End-of-life care; safe dispo pending      Subjective: Minimal interaction, no complaints.   Examination:  General exam: Elderly frail cachectic Respiratory system: Clear to auscultation. Respiratory effort normal. Cardiovascular system: S1 & S2 heard, RRR. No JVD, murmurs, rubs, gallops or clicks. No pedal edema. Gastrointestinal system: Abdomen is nondistended, soft and nontender. No organomegaly or masses felt. Normal bowel sounds heard. Central nervous system: Sleepy, occasionally mumbles some words Extremities: Symmetric 4 x 5 power. Skin: No rashes, lesions or ulcers Psychiatry: No agitation                  Objective: Vitals:   07/01/24 0219 07/01/24 0225 07/02/24 0047 07/02/24 0813  BP: (!) 97/48 (!) 97/48 124/65 126/68  Pulse: 69 69 78 75  Resp: 16 16 18    Temp: 97.9 F (36.6 C) 97.9 F (36.6 C) 97.9 F (36.6 C) 97.9 F (36.6 C)  TempSrc:  Oral    SpO2: 100% 99% 98% 100%  Weight:      Height:        Intake/Output Summary (Last 24 hours) at 07/02/2024 9075 Last data filed at 07/01/2024 2000 Gross per  24 hour  Intake --  Output 250 ml  Net -250 ml   Filed Weights   06/18/24 0936 06/18/24 1938  Weight: 52.2 kg 49.6 kg    Scheduled Meds:  collagenase    Topical Daily   feeding supplement  237 mL Oral TID BM   Continuous Infusions:  Nutritional status Signs/Symptoms: estimated needs Interventions: Ensure Enlive (each supplement provides 350kcal and 20 grams of protein), MVI, Prostat Body mass index is 19.37 kg/m.  Data Reviewed:   CBC: Recent Labs  Lab 06/26/24 0306  WBC 5.2  HGB 9.5*  HCT 29.4*  MCV 86.7  PLT 456*   Basic Metabolic Panel: Recent Labs  Lab 06/28/24 0352  PHOS 2.6   GFR: Estimated Creatinine Clearance: 36.6 mL/min (by C-G formula based on SCr of 0.55 mg/dL). Liver Function Tests: No results for input(s): AST, ALT, ALKPHOS, BILITOT, PROT, ALBUMIN in the last 168 hours. No results for input(s): LIPASE, AMYLASE in the last 168 hours. No results for input(s): AMMONIA in the last 168 hours. Coagulation Profile: No results for input(s): INR, PROTIME in the last 168 hours. Cardiac Enzymes: No results for input(s): CKTOTAL, CKMB, CKMBINDEX, TROPONINI in the last 168 hours. BNP (last 3 results) No results for input(s): PROBNP in the last 8760 hours. HbA1C: No results for input(s): HGBA1C in the last 72 hours. CBG: Recent Labs  Lab 06/27/24 2050 06/27/24 2054 06/28/24 0016 06/28/24 2043 07/01/24  2102  GLUCAP 50* 94 72 63* 73   Lipid Profile: No results for input(s): CHOL, HDL, LDLCALC, TRIG, CHOLHDL, LDLDIRECT in the last 72 hours. Thyroid  Function Tests: No results for input(s): TSH, T4TOTAL, FREET4, T3FREE, THYROIDAB in the last 72 hours. Anemia Panel: No results for input(s): VITAMINB12, FOLATE, FERRITIN, TIBC, IRON, RETICCTPCT in the last 72 hours. Sepsis Labs: No results for input(s): PROCALCITON, LATICACIDVEN in the last 168 hours.  No results found for this or  any previous visit (from the past 240 hours).       Radiology Studies: No results found.         LOS: 14 days   Time spent= 35 mins    Burgess JAYSON Dare, MD [facility]  If 7PM-7AM, please contact night-coverage  07/02/2024, 9:24 AM

## 2024-07-03 DIAGNOSIS — Z515 Encounter for palliative care: Secondary | ICD-10-CM | POA: Diagnosis not present

## 2024-07-03 DIAGNOSIS — Z7189 Other specified counseling: Secondary | ICD-10-CM | POA: Diagnosis not present

## 2024-07-03 DIAGNOSIS — Z66 Do not resuscitate: Secondary | ICD-10-CM | POA: Diagnosis not present

## 2024-07-03 DIAGNOSIS — R6510 Systemic inflammatory response syndrome (SIRS) of non-infectious origin without acute organ dysfunction: Secondary | ICD-10-CM | POA: Diagnosis not present

## 2024-07-03 NOTE — Plan of Care (Signed)
  Problem: Education: Goal: Knowledge of General Education information will improve Description: Including pain rating scale, medication(s)/side effects and non-pharmacologic comfort measures Outcome: Not Progressing   Problem: Health Behavior/Discharge Planning: Goal: Ability to manage health-related needs will improve Outcome: Not Progressing   Problem: Pain Managment: Goal: General experience of comfort will improve and/or be controlled Outcome: Not Progressing

## 2024-07-03 NOTE — Progress Notes (Signed)
 PROGRESS NOTE    Sabrina Hart  FMW:[id number] DOB: 1932/03/08 DOA: 06/18/2024 PCP: [name], MD    Brief Narrative:  89 year old F with PMH of dementia, CAD/stent, HTN, HLD, hypothyroidism, glaucoma, DVT on Eliquis  and chronic bilateral gluteal decubitus and right foot ulcer presenting with increased CPH/bloody drainage after starting Eliquis  for DVT, decline in mentation and activity 4 weeks, and admitted with severe sepsis due to infected decubitus ulcer, right hip abscess and osteomyelitis of first metatarsal head of the right foot.    CT abdomen pelvis showed crescentic abscess at the right hip measuring 12 x 2 x 12 cm, prominent stool favoring constipation, mesenteric edema, severe arthropathy of the hips possible synovitis in the right hip.    Patient received vancomycin , aztreonam  in the ED in the setting of penicillin allergy. Was also diagnosed with right foot abscess and osteomyelitis.  Orthopedic and general surgery consulted, and did not feel there is indication for I&D and recommended wound care and palliative care consult.  After meeting with palliative care, family decided to continue current scope of care with antibiotics.  ID consulted on 7/26, and escalated antibiotics to daptomycin , ceftriaxone  and Flagyl .   After goal of care discussion with patient's daughter, transition to full comfort care on 7/29.  TOC consulted for residential hospice placement.  At first daughter agreeable for residential placement but later on 7/30 when she changed her mind.No safe dispo at this time. She remains comfort care.    Assessment & Plan:  Principal Problem:   SIRS (systemic inflammatory response syndrome) (HCC) Active Problems:   CAD (coronary artery disease)   Hypothyroidism   Hyperlipidemia   Severe late onset Alzheimer's dementia (HCC)   Essential hypertension   Primary open angle glaucoma (POAG) of both eyes   Pressure injury of right hip, unstageable (HCC)    Gangrene of right foot (HCC)   Pressure injury of skin    End-of-life care-transitioned to full comfort care on 7/29 after discussion with patient's daughter at bedside. - Comfort pathway with as needed medications-ordered. - Family interested in residential hospice.  TOC consulted for referral.   Severe sepsis due to infected decubitus ulcers, right hip abscess and right foot abscess and osteomyelitis:POA -MRI of right foot ulceration medial to the first MTP joint with underlying abscess and osteomyelitis of the first metatarsal head, its sesmoids and the base of first proximal phalanx Off all Abx now.  LUE PICC in place for med administration. -Transitioned to full comfort care on 7/29.   Acute septic encephalopathy, POA: sleepy but wakes to voice.  Only mumbles words. Dementia without behavioral disturbance   Positive blood culture: Blood culture with Staphylococcus capitis in 1 out of 2 bottles likely contaminant   Right kidney lesion: Favoring a cyst but is indeterminant -MRI recommended for follow-up but doubt utility given patient's age and poor prognosis    Hypertension normotensive off home antihypertensive meds.   AKI: Likely ATN due to severe sepsis.  Resolved.   Hypokalemia   Mild hyponatremia: Resolved.   Normocytic anemia: Stable.   Hyperlipidemia   CAD: Status post stents   Hypothyroidism Discontinue home medications   Glaucoma   History of DVT   Increased nutrient needs Body mass index is 19.37 kg/m. Nutrition Problem: Increased nutrient needs Etiology: wound healing Signs/Symptoms: estimated needs Interventions: Ensure Enlive (each supplement provides 350kcal and 20 grams of protein), MVI, Prostat   Consultants:  Orthopedic surgery General surgery Palliative medicine Infectious disease Interventional radiology  Procedures: None   Microbiology summarized: MRSA PCR screen nonreactive Blood culture with Staph capitis in 1 out of 2  bottles   DVT prophylaxis: Patient is full comfort care   Code Status: DNR-comfort Family Communication: Attempted to call daughter but no response Status is: Inpatient Remains inpatient appropriate because: End-of-life care; safe dispo pending      Subjective: Seen at bedside minimally responsive No complaints  Examination:  General exam: Elderly frail cachectic Respiratory system: Clear to auscultation. Respiratory effort normal. Cardiovascular system: S1 & S2 heard, RRR. No JVD, murmurs, rubs, gallops or clicks. No pedal edema. Gastrointestinal system: Abdomen is nondistended, soft and nontender. No organomegaly or masses felt. Normal bowel sounds heard. Central nervous system: Sleepy, occasionally mumbles some words Extremities: Symmetric 4 x 5 power. Skin: No rashes, lesions or ulcers Psychiatry: No agitation                  Objective: Vitals:   07/02/24 0047 07/02/24 0813 07/02/24 2001 07/03/24 0726  BP: 124/65 126/68 (!) 126/40 (!) 144/63  Pulse: 78 75 74 72  Resp: 18  16   Temp: 97.9 F (36.6 C) 97.9 F (36.6 C) 97.6 F (36.4 C) 97.6 F (36.4 C)  TempSrc:    Oral  SpO2: 98% 100% 100% 100%  Weight:      Height:        Intake/Output Summary (Last 24 hours) at 07/03/2024 1103 Last data filed at 07/03/2024 0542 Gross per 24 hour  Intake 120 ml  Output 475 ml  Net -355 ml   Filed Weights   06/18/24 0936 06/18/24 1938  Weight: 52.2 kg 49.6 kg    Scheduled Meds:  collagenase    Topical Daily   Continuous Infusions:  Nutritional status Signs/Symptoms: estimated needs Interventions: Ensure Enlive (each supplement provides 350kcal and 20 grams of protein), MVI, Prostat Body mass index is 19.37 kg/m.  Data Reviewed:   CBC: No results for input(s): WBC, NEUTROABS, HGB, HCT, MCV, PLT in the last 168 hours. Basic Metabolic Panel: Recent Labs  Lab 06/28/24 0352  PHOS 2.6   GFR: Estimated Creatinine Clearance: 36.6 mL/min  (by C-G formula based on SCr of 0.55 mg/dL). Liver Function Tests: No results for input(s): AST, ALT, ALKPHOS, BILITOT, PROT, ALBUMIN in the last 168 hours. No results for input(s): LIPASE, AMYLASE in the last 168 hours. No results for input(s): AMMONIA in the last 168 hours. Coagulation Profile: No results for input(s): INR, PROTIME in the last 168 hours. Cardiac Enzymes: No results for input(s): CKTOTAL, CKMB, CKMBINDEX, TROPONINI in the last 168 hours. BNP (last 3 results) No results for input(s): PROBNP in the last 8760 hours. HbA1C: No results for input(s): HGBA1C in the last 72 hours. CBG: Recent Labs  Lab 06/27/24 2050 06/27/24 2054 06/28/24 0016 06/28/24 2043 07/01/24 2102  GLUCAP 50* 94 72 63* 73   Lipid Profile: No results for input(s): CHOL, HDL, LDLCALC, TRIG, CHOLHDL, LDLDIRECT in the last 72 hours. Thyroid  Function Tests: No results for input(s): TSH, T4TOTAL, FREET4, T3FREE, THYROIDAB in the last 72 hours. Anemia Panel: No results for input(s): VITAMINB12, FOLATE, FERRITIN, TIBC, IRON, RETICCTPCT in the last 72 hours. Sepsis Labs: No results for input(s): PROCALCITON, LATICACIDVEN in the last 168 hours.  No results found for this or any previous visit (from the past 240 hours).       Radiology Studies: No results found.         LOS: 15 days   Time spent= 35 mins  Burgess JAYSON Dare, MD [facility]  If 7PM-7AM, please contact night-coverage  07/03/2024, 11:03 AM

## 2024-07-03 NOTE — Progress Notes (Signed)
 Daily Progress Note   Date: 07/03/2024   Patient Name: Sabrina Hart  DOB: 11-11-1932  MRN: [id number]  Age / Sex: 89 y.o., female  Attending Physician: Caleen Burgess BROCKS, MD Primary Care Physician: [name], MD Admit Date: 06/18/2024 Length of Stay: 15 days  Reason for Follow-up: Establishing goals of care, Non pain symptom management, Pain control, and Terminal Care  Past Medical History:  Diagnosis Date   Anginal pain [facility])    1990's; 09/28/2012   Arthritis    very mild (09/28/2012)   Coronary artery disease 09/27/2012   s/p PCI of the RCA with DES with remote PCI in [place] 15 to 20 years ago. 2. cath 02/2013 patent mid RCA stent, 30% left main, 70% mid to distaal tanden 70% lesions x 3, 60% mid diag, 50% ostial LCx, 40% mid LCx.    Glaucoma    Hyperlipidemia    Hypothyroidism     Subjective:   Subjective: Chart Reviewed. Updates received. Patient Assessed. Created space and opportunity for patient  and family to explore thoughts and feelings regarding current medical situation.  Today's Discussion: Today before meeting with the patient/family, I reviewed the chart including nurse note from today, hospitalist note from today.  I also reviewed vital signs, MAR, nursing flowsheets.  Vital signs today include temperature temperature of 97.6, heart rate of 72, no respiratory rate recorded, blood pressure 144/63, saturating 100% on room air.  She received 25 mcg of fentanyl  at 5:42 AM today. We will leave them available because of anticipated need and previous need.  Today I saw the patient at bedside, initially no family was present.  When I entered the room she was moving around in bed which seemed to be trying to get out of bed, although she like the strength to do so.  I counted a respiratory rate of 22.  No facial grimacing, increased work of breathing, or other sign of dyspnea or pain.  She seemed a bit anxious.  I went to speak with the nurse about providing a dose  of sublingual Ativan  to help with her anxiety and distress.  When I returned a family member was at the bedside.  We spent a great deal of time talk about the patient, her cultural knowledge and heritage, and things about her life.  She states that she really enjoys having her hair done and her head scratched.  She has been doing this to help relieve symptoms, and at the moment I noticed that the patient seemed much calmer.  I discussed use of Ativan  sublingual to assist and she is in agreement with this.  Her plan is to wash the patient's hair today and nursing provided a dry shampoo cap to help.  I shared that I am back on service through Friday and would return to check on the patient daily for symptom management needs.  I provided emotional and general support through therapeutic listening, empathy, sharing of stories, and other techniques. I answered all questions and addressed all concerns to the best of my ability.  Review of Systems  Unable to perform ROS   Objective:   Primary Diagnoses: Present on Admission:  Hypothyroidism  Primary open angle glaucoma (POAG) of both eyes  Severe late onset Alzheimer's dementia (HCC)  Hyperlipidemia  Essential hypertension  CAD (coronary artery disease)  SIRS (systemic inflammatory response syndrome) (HCC)   Vital Signs:  BP (!) 144/63 (BP Location: Left Leg)   Pulse 72   Temp 97.6 F (36.4 C) (  Oral)   Resp 16   Ht 5' 3 (1.6 m)   Wt 49.6 kg   SpO2 100%   BMI 19.37 kg/m   Physical Exam Vitals and nursing note reviewed.  Constitutional:      General: She is sleeping. She is not in acute distress.    Appearance: She is ill-appearing.     Comments: Appears frail, seems a bit agitated/anxious  HENT:     Head: Normocephalic and atraumatic.  Pulmonary:     Effort: Pulmonary effort is normal. No respiratory distress.     Comments: RR 22, even unlabored Abdominal:     General: Abdomen is flat.  Skin:    General: Skin is warm and dry.   Neurological:     General: No focal deficit present.  Psychiatric:        Mood and Affect: Mood normal.        Behavior: Behavior normal.     Palliative Assessment/Data: 10%   Assessment & Plan:   HPI/Patient Profile:  89 y.o. female  with past medical history of hypertension, hyperlipidemia, hypothyroidism, CAD status post stent, dementia, glaucoma, DVT on Eliquis  presented with confusion, wound changes, constipation.  She was admitted on 06/18/2024 with SIRS, severe sepsis secondary to infected decubitus ulcers, right hip abscess, osteomyelitis of the first metatarsal head of the right foot, acute septic encephalopathy, constipation, AKI, and others.    Palliative medicine was consulted for GOC conversations.  SUMMARY OF RECOMMENDATIONS   DNR-comfort Continue comfort care Request as needed dose of sublingual Ativan  from nursing See symptom management orders below Palliative medicine will continue to follow  Symptom Management:  Tylenol  650 mg PR every 6 hours as needed mild pain (1-3) or fever Artificial tears 1 drop OU 4 times daily as needed dry eyes Fentanyl  25 mcg IV every hour as needed severe pain (7-10) Robinul  0.2 mg IV every 4 hours as needed extend secretions Haldol  0.5 mg IV every 4 hours as needed agitation or delirium Ativan  1 mg sublingual every 4 hours as needed anxiety Zofran  4 mg IV every 6 hours as needed nausea or vomiting  Code Status: DNR-comfort  Prognosis: < 2 weeks  Discharge Planning: Anticipated Hospital Death  Discussed with: Medical team, nursing team, [facility] team, hospice liaison  Thank you for allowing us  to participate in the care of [facility] PMT will continue to support holistically.  Billing based on MDM: High  Problems Addressed: One acute or chronic illness or injury that poses a threat to life or bodily function  Risks: Parenteral controlled substances  Detailed review of medical records (labs, imaging, vital signs),  medically appropriate exam, discussed with treatment team, counseling and education to patient, family, & staff, documenting clinical information, medication management, coordination of care  Camellia Kays, NP Palliative Medicine Team  Team Phone # [phone] (Nights/Weekends)  07/28/2021, 8:17 AM

## 2024-07-04 DIAGNOSIS — Z66 Do not resuscitate: Secondary | ICD-10-CM | POA: Diagnosis not present

## 2024-07-04 DIAGNOSIS — Z515 Encounter for palliative care: Secondary | ICD-10-CM | POA: Diagnosis not present

## 2024-07-04 DIAGNOSIS — Z7189 Other specified counseling: Secondary | ICD-10-CM | POA: Diagnosis not present

## 2024-07-04 DIAGNOSIS — R6510 Systemic inflammatory response syndrome (SIRS) of non-infectious origin without acute organ dysfunction: Secondary | ICD-10-CM | POA: Diagnosis not present

## 2024-07-04 MED ORDER — ALTEPLASE 2 MG IJ SOLR
2.0000 mg | Freq: Once | INTRAMUSCULAR | Status: DC
  Filled 2024-07-04: qty 2

## 2024-07-04 NOTE — Plan of Care (Signed)
  Problem: Education: Goal: Knowledge of General Education information will improve Description: Including pain rating scale, medication(s)/side effects and non-pharmacologic comfort measures Outcome: Progressing   Problem: Health Behavior/Discharge Planning: Goal: Ability to manage health-related needs will improve Outcome: Progressing   Problem: Pain Managment: Goal: General experience of comfort will improve and/or be controlled Outcome: Progressing

## 2024-07-04 NOTE — Progress Notes (Signed)
 PROGRESS NOTE    Sabrina Hart  FMW:[id number] DOB: 10-Oct-1932 DOA: 06/18/2024 PCP: [name], MD    Brief Narrative:  89 year old F with PMH of dementia, CAD/stent, HTN, HLD, hypothyroidism, glaucoma, DVT on Eliquis  and chronic bilateral gluteal decubitus and right foot ulcer presenting with increased CPH/bloody drainage after starting Eliquis  for DVT, decline in mentation and activity 4 weeks, and admitted with severe sepsis due to infected decubitus ulcer, right hip abscess and osteomyelitis of first metatarsal head of the right foot.    CT abdomen pelvis showed crescentic abscess at the right hip measuring 12 x 2 x 12 cm, prominent stool favoring constipation, mesenteric edema, severe arthropathy of the hips possible synovitis in the right hip.    Patient received vancomycin , aztreonam  in the ED in the setting of penicillin allergy. Was also diagnosed with right foot abscess and osteomyelitis.  Orthopedic and general surgery consulted, and did not feel there is indication for I&D and recommended wound care and palliative care consult.  After meeting with palliative care, family decided to continue current scope of care with antibiotics.  ID consulted on 7/26, and escalated antibiotics to daptomycin , ceftriaxone  and Flagyl .   After goal of care discussion with patient's daughter, transition to full comfort care on 7/29.  TOC consulted for residential hospice placement.  At first daughter agreeable for residential placement but later on 7/30 when she changed her mind.No safe dispo at this time. She remains comfort care.    Assessment & Plan:  Principal Problem:   SIRS (systemic inflammatory response syndrome) (HCC) Active Problems:   CAD (coronary artery disease)   Hypothyroidism   Hyperlipidemia   Severe late onset Alzheimer's dementia (HCC)   Essential hypertension   Primary open angle glaucoma (POAG) of both eyes   Pressure injury of right hip, unstageable (HCC)    Gangrene of right foot (HCC)   Pressure injury of skin    End-of-life care-transitioned to full comfort care on 7/29 after discussion with patient's daughter at bedside. - Comfort pathway with as needed medications-ordered. - Family interested in residential hospice.  TOC consulted for referral.   Severe sepsis due to infected decubitus ulcers, right hip abscess and right foot abscess and osteomyelitis:POA -MRI of right foot ulceration medial to the first MTP joint with underlying abscess and osteomyelitis of the first metatarsal head, its sesmoids and the base of first proximal phalanx Off all Abx now.  LUE PICC in place for med administration. -Transitioned to full comfort care on 7/29.   Acute septic encephalopathy, POA: sleepy but wakes to voice.  Only mumbles words. Dementia without behavioral disturbance   Positive blood culture: Blood culture with Staphylococcus capitis in 1 out of 2 bottles likely contaminant   Right kidney lesion: Favoring a cyst but is indeterminant -MRI recommended for follow-up but doubt utility given patient's age and poor prognosis    Hypertension normotensive off home antihypertensive meds.   AKI: Likely ATN due to severe sepsis.  Resolved.   Hypokalemia   Mild hyponatremia: Resolved.   Normocytic anemia: Stable.   Hyperlipidemia   CAD: Status post stents   Hypothyroidism Discontinue home medications   Glaucoma   History of DVT   Increased nutrient needs Body mass index is 19.37 kg/m. Nutrition Problem: Increased nutrient needs Etiology: wound healing Signs/Symptoms: estimated needs Interventions: Ensure Enlive (each supplement provides 350kcal and 20 grams of protein), MVI, Prostat   Consultants:  Orthopedic surgery General surgery Palliative medicine Infectious disease Interventional radiology  Procedures: None   Microbiology summarized: MRSA PCR screen nonreactive Blood culture with Staph capitis in 1 out of 2  bottles   DVT prophylaxis: Patient is full comfort care   Code Status: DNR-comfort Family Communication: Attempted to call daughter but no response Status is: Inpatient Remains inpatient appropriate because: End-of-life care; safe dispo pending      Subjective: Minimal interaction, no complaints  Examination:  General exam: Elderly frail cachectic Respiratory system: Clear to auscultation. Respiratory effort normal. Cardiovascular system: S1 & S2 heard, RRR. No JVD, murmurs, rubs, gallops or clicks. No pedal edema. Gastrointestinal system: Abdomen is nondistended, soft and nontender. No organomegaly or masses felt. Normal bowel sounds heard. Central nervous system: Sleepy, occasionally mumbles some words Extremities: Symmetric 4 x 5 power. Skin: No rashes, lesions or ulcers Psychiatry: No agitation                  Objective: Vitals:   07/02/24 0813 07/02/24 2001 07/03/24 0726 07/03/24 1954  BP: 126/68 (!) 126/40 (!) 144/63 136/64  Pulse: 75 74 72 81  Resp:  16  17  Temp: 97.9 F (36.6 C) 97.6 F (36.4 C) 97.6 F (36.4 C) 98 F (36.7 C)  TempSrc:   Oral   SpO2: 100% 100% 100% 97%  Weight:      Height:       No intake or output data in the 24 hours ending 07/04/24 1037 Filed Weights   06/18/24 0936 06/18/24 1938  Weight: 52.2 kg 49.6 kg    Scheduled Meds:  collagenase    Topical Daily   Continuous Infusions:  Nutritional status Signs/Symptoms: estimated needs Interventions: Ensure Enlive (each supplement provides 350kcal and 20 grams of protein), MVI, Prostat Body mass index is 19.37 kg/m.  Data Reviewed:   CBC: No results for input(s): WBC, NEUTROABS, HGB, HCT, MCV, PLT in the last 168 hours. Basic Metabolic Panel: Recent Labs  Lab 06/28/24 0352  PHOS 2.6   GFR: Estimated Creatinine Clearance: 36.6 mL/min (by C-G formula based on SCr of 0.55 mg/dL). Liver Function Tests: No results for input(s): AST, ALT, ALKPHOS,  BILITOT, PROT, ALBUMIN in the last 168 hours. No results for input(s): LIPASE, AMYLASE in the last 168 hours. No results for input(s): AMMONIA in the last 168 hours. Coagulation Profile: No results for input(s): INR, PROTIME in the last 168 hours. Cardiac Enzymes: No results for input(s): CKTOTAL, CKMB, CKMBINDEX, TROPONINI in the last 168 hours. BNP (last 3 results) No results for input(s): PROBNP in the last 8760 hours. HbA1C: No results for input(s): HGBA1C in the last 72 hours. CBG: Recent Labs  Lab 06/27/24 2050 06/27/24 2054 06/28/24 0016 06/28/24 2043 07/01/24 2102  GLUCAP 50* 94 72 63* 73   Lipid Profile: No results for input(s): CHOL, HDL, LDLCALC, TRIG, CHOLHDL, LDLDIRECT in the last 72 hours. Thyroid  Function Tests: No results for input(s): TSH, T4TOTAL, FREET4, T3FREE, THYROIDAB in the last 72 hours. Anemia Panel: No results for input(s): VITAMINB12, FOLATE, FERRITIN, TIBC, IRON, RETICCTPCT in the last 72 hours. Sepsis Labs: No results for input(s): PROCALCITON, LATICACIDVEN in the last 168 hours.  No results found for this or any previous visit (from the past 240 hours).       Radiology Studies: No results found.         LOS: 16 days   Time spent= 35 mins    Burgess JAYSON Dare, MD [facility]  If 7PM-7AM, please contact night-coverage  07/04/2024, 10:37 AM

## 2024-07-04 NOTE — Progress Notes (Signed)
 Daily Progress Note   Date: 07/04/2024   Patient Name: Sabrina Hart  DOB: Apr 25, 1932  MRN: [id number]  Age / Sex: 89 y.o., female  Attending Physician: Caleen Burgess BROCKS, MD Primary Care Physician: [name], MD Admit Date: 06/18/2024 Length of Stay: 16 days  Reason for Follow-up: Non pain symptom management, Pain control, Psychosocial/spiritual support, and Terminal Care  Past Medical History:  Diagnosis Date   Anginal pain [facility])    1990's; 09/28/2012   Arthritis    very mild (09/28/2012)   Coronary artery disease 09/27/2012   s/p PCI of the RCA with DES with remote PCI in [place] 15 to 20 years ago. 2. cath 02/2013 patent mid RCA stent, 30% left main, 70% mid to distaal tanden 70% lesions x 3, 60% mid diag, 50% ostial LCx, 40% mid LCx.    Glaucoma    Hyperlipidemia    Hypothyroidism     Subjective:   Subjective: Chart Reviewed. Updates received. Patient Assessed. Created space and opportunity for patient  and family to explore thoughts and feelings regarding current medical situation.  Today's Discussion: Today before meeting with the patient/family, I reviewed the chart notes including nursing note from today.  Later in the day I also reviewed hospitalist note from today. I also reviewed vital signs, nursing flowsheets, medication administrations record, labs, and imaging.  No labs due to comfort status.  Patient's vitals are temperature of 98, heart rate 81, respiratory rate 17, blood pressure 136/64, saturations 97% on room air.  The patient received 25 mcg of fentanyl  at 5:42 AM yesterday and began at 6:31 AM today.  She received sublingual liquid Ativan  at 1:16 PM yesterday.  No other symptom management needs.  Today saw the patient at bedside, no family is present.  She appeared comfortable, not as anxious/agitated as yesterday.  Resting peacefully with respirations even and unlabored.  Respirations concomitant 10 breaths/min today.  Variable depth of respiration,  but no obvious apneic pauses.  Overall patient appears comfortable and peaceful while on comfort care/approaching end-of-life.  Review of Systems  Unable to perform ROS   Objective:   Primary Diagnoses: Present on Admission:  Hypothyroidism  Primary open angle glaucoma (POAG) of both eyes  Severe late onset Alzheimer's dementia (HCC)  Hyperlipidemia  Essential hypertension  CAD (coronary artery disease)  SIRS (systemic inflammatory response syndrome) (HCC)   Vital Signs:  BP 136/64 (BP Location: Left Leg)   Pulse 81   Temp 98 F (36.7 C)   Resp 17   Ht 5' 3 (1.6 m)   Wt 49.6 kg   SpO2 97%   BMI 19.37 kg/m   Physical Exam Vitals and nursing note reviewed.  Constitutional:      General: She is sleeping. She is not in acute distress.    Appearance: She is ill-appearing.  HENT:     Head: Normocephalic and atraumatic.  Pulmonary:     Effort: Pulmonary effort is normal. No respiratory distress.     Comments: RR 10 Abdominal:     General: Abdomen is flat. There is no distension.     Palliative Assessment/Data: 10%   Existing Vynca/ACP Documentation: MOST form signed 06/17/2024 DNR effective 06/17/2024  Assessment & Plan:   HPI/Patient Profile:   89 y.o. female  with past medical history of hypertension, hyperlipidemia, hypothyroidism, CAD status post stent, dementia, glaucoma, DVT on Eliquis  presented with confusion, wound changes, constipation.  She was admitted on 06/18/2024 with SIRS, severe sepsis secondary to infected decubitus ulcers, right  hip abscess, osteomyelitis of the first metatarsal head of the right foot, acute septic encephalopathy, constipation, AKI, and others.    Palliative medicine was consulted for GOC conversations.  SUMMARY OF RECOMMENDATIONS   DNR-comfort Continue comfort care See symptom management orders below Palliative medicine will continue to follow  Symptom Management:  Tylenol  650 mg PR every 6 hours as needed mild pain (1-3) or  fever Artificial tears 1 drop OU 4 times daily as needed dry eyes Fentanyl  25 mcg IV every hour as needed severe pain (7-10) Robinul  0.2 mg IV every 4 hours as needed extend secretions Haldol  0.5 mg IV every 4 hours as needed agitation or delirium Ativan  1 mg sublingual every 4 hours as needed anxiety Zofran  4 mg IV every 6 hours as needed nausea or vomiting  Code Status: DNR - Comfort  Prognosis: Hours - Days  Discharge Planning: Anticipated Hospital Death  Discussed with: Medical team, nursing team  Thank you for allowing us  to participate in the care of [facility] PMT will continue to support holistically.  Billing based on MDM: I  Problems Addressed: One acute or chronic illness or injury that poses a threat to life or bodily function  Risks: Parenteral controlled substances   Detailed review of medical records (labs, imaging, vital signs), medically appropriate exam, discussed with treatment team, counseling and education to patient, family, & staff, documenting clinical information, medication management, coordination of care  Camellia Kays, NP Palliative Medicine Team  Team Phone # [phone] (Nights/Weekends)  07/28/2021, 8:17 AM

## 2024-07-05 DIAGNOSIS — R6510 Systemic inflammatory response syndrome (SIRS) of non-infectious origin without acute organ dysfunction: Secondary | ICD-10-CM | POA: Diagnosis not present

## 2024-07-05 DIAGNOSIS — Z7189 Other specified counseling: Secondary | ICD-10-CM | POA: Diagnosis not present

## 2024-07-05 DIAGNOSIS — Z515 Encounter for palliative care: Secondary | ICD-10-CM | POA: Diagnosis not present

## 2024-07-05 DIAGNOSIS — M160 Bilateral primary osteoarthritis of hip: Secondary | ICD-10-CM | POA: Diagnosis not present

## 2024-07-05 NOTE — Plan of Care (Signed)
  Problem: Education: Goal: Knowledge of General Education information will improve Description: Including pain rating scale, medication(s)/side effects and non-pharmacologic comfort measures Outcome: Progressing   Problem: Health Behavior/Discharge Planning: Goal: Ability to manage health-related needs will improve Outcome: Progressing   Problem: Pain Managment: Goal: General experience of comfort will improve and/or be controlled Outcome: Progressing

## 2024-07-05 NOTE — Progress Notes (Signed)
 PROGRESS NOTE    Sabrina Hart  FMW:[id number] DOB: May 02, 1932 DOA: 06/18/2024 PCP: [name], MD    Brief Narrative:  89 year old F with PMH of dementia, CAD/stent, HTN, HLD, hypothyroidism, glaucoma, DVT on Eliquis  and chronic bilateral gluteal decubitus and right foot ulcer presenting with increased CPH/bloody drainage after starting Eliquis  for DVT, decline in mentation and activity 4 weeks, and admitted with severe sepsis due to infected decubitus ulcer, right hip abscess and osteomyelitis of first metatarsal head of the right foot.    CT abdomen pelvis showed crescentic abscess at the right hip measuring 12 x 2 x 12 cm, prominent stool favoring constipation, mesenteric edema, severe arthropathy of the hips possible synovitis in the right hip.    Patient received vancomycin , aztreonam  in the ED in the setting of penicillin allergy. Was also diagnosed with right foot abscess and osteomyelitis.  Orthopedic and general surgery consulted, and did not feel there is indication for I&D and recommended wound care and palliative care consult.  After meeting with palliative care, family decided to continue current scope of care with antibiotics.  ID consulted on 7/26, and escalated antibiotics to daptomycin , ceftriaxone  and Flagyl .   After goal of care discussion with patient's daughter, transition to full comfort care on 7/29.  TOC consulted for residential hospice placement.  At first daughter agreeable for residential placement but later on 7/30 when she changed her mind.No safe dispo at this time. She remains comfort care.    Assessment & Plan:  Principal Problem:   SIRS (systemic inflammatory response syndrome) (HCC) Active Problems:   CAD (coronary artery disease)   Hypothyroidism   Hyperlipidemia   Severe late onset Alzheimer's dementia (HCC)   Essential hypertension   Primary open angle glaucoma (POAG) of both eyes   Pressure injury of right hip, unstageable (HCC)    Gangrene of right foot (HCC)   Pressure injury of skin    End-of-life care-transitioned to full comfort care on 7/29 after discussion with patient's daughter at bedside. - Comfort pathway with as needed medications-ordered. - Family interested in residential hospice.  TOC consulted for referral.   Severe sepsis due to infected decubitus ulcers, right hip abscess and right foot abscess and osteomyelitis:POA -MRI of right foot ulceration medial to the first MTP joint with underlying abscess and osteomyelitis of the first metatarsal head, its sesmoids and the base of first proximal phalanx Off all Abx now.  LUE PICC in place for med administration. -Transitioned to full comfort care on 7/29.   Acute septic encephalopathy, POA: sleepy but wakes to voice.  Only mumbles words. Dementia without behavioral disturbance   Positive blood culture: Blood culture with Staphylococcus capitis in 1 out of 2 bottles likely contaminant   Right kidney lesion: Favoring a cyst but is indeterminant -MRI recommended for follow-up but doubt utility given patient's age and poor prognosis    Hypertension normotensive off home antihypertensive meds.   AKI: Likely ATN due to severe sepsis.  Resolved.   Hypokalemia   Mild hyponatremia: Resolved.   Normocytic anemia: Stable.   Hyperlipidemia   CAD: Status post stents   Hypothyroidism Discontinue home medications   Glaucoma   History of DVT     Procedures: None   Microbiology summarized: MRSA PCR screen nonreactive Blood culture with Staph capitis in 1 out of 2 bottles   DVT prophylaxis: Patient is full comfort care   Code Status: DNR-comfort Family Communication: Attempted to call daughter but no response Status is: Inpatient Remains  inpatient appropriate because: End-of-life care; safe dispo pending      Subjective: Minimal interaction, no complaints.  Examination:  General exam: Elderly frail cachectic Respiratory system:  Clear to auscultation. Respiratory effort normal. Cardiovascular system: S1 & S2 heard, RRR. No JVD, murmurs, rubs, gallops or clicks. No pedal edema. Gastrointestinal system: Abdomen is nondistended, soft and nontender. No organomegaly or masses felt. Normal bowel sounds heard. Central nervous system: Sleepy, occasionally mumbles some words Extremities: Symmetric 4 x 5 power. Skin: No rashes, lesions or ulcers Psychiatry: No agitation                Diet Orders (From admission, onward)     Start     Ordered   07/04/24 1301  Diet regular Room service appropriate? Yes; Fluid consistency: Thin  Diet effective now       Question Answer Comment  Room service appropriate? Yes   Fluid consistency: Thin      07/04/24 1300            Objective: Vitals:   07/02/24 2001 07/03/24 0726 07/03/24 1954 07/04/24 2000  BP: (!) 126/40 (!) 144/63 136/64 (!) 130/55  Pulse: 74 72 81 74  Resp: 16  17 18   Temp: 97.6 F (36.4 C) 97.6 F (36.4 C) 98 F (36.7 C) 98 F (36.7 C)  TempSrc:  Oral    SpO2: 100% 100% 97% 99%  Weight:      Height:       No intake or output data in the 24 hours ending 07/05/24 1040 Filed Weights   06/18/24 0936 06/18/24 1938  Weight: 52.2 kg 49.6 kg    Scheduled Meds:  alteplase   2 mg Intracatheter Once   collagenase    Topical Daily   Continuous Infusions:  Nutritional status Signs/Symptoms: estimated needs Interventions: Ensure Enlive (each supplement provides 350kcal and 20 grams of protein), MVI, Prostat Body mass index is 19.37 kg/m.  Data Reviewed:   CBC: No results for input(s): WBC, NEUTROABS, HGB, HCT, MCV, PLT in the last 168 hours. Basic Metabolic Panel: No results for input(s): NA, K, CL, CO2, GLUCOSE, BUN, CREATININE, CALCIUM , MG, PHOS in the last 168 hours. GFR: Estimated Creatinine Clearance: 36.6 mL/min (by C-G formula based on SCr of 0.55 mg/dL). Liver Function Tests: No results for  input(s): AST, ALT, ALKPHOS, BILITOT, PROT, ALBUMIN in the last 168 hours. No results for input(s): LIPASE, AMYLASE in the last 168 hours. No results for input(s): AMMONIA in the last 168 hours. Coagulation Profile: No results for input(s): INR, PROTIME in the last 168 hours. Cardiac Enzymes: No results for input(s): CKTOTAL, CKMB, CKMBINDEX, TROPONINI in the last 168 hours. BNP (last 3 results) No results for input(s): PROBNP in the last 8760 hours. HbA1C: No results for input(s): HGBA1C in the last 72 hours. CBG: Recent Labs  Lab 06/28/24 2043 07/01/24 2102  GLUCAP 63* 73   Lipid Profile: No results for input(s): CHOL, HDL, LDLCALC, TRIG, CHOLHDL, LDLDIRECT in the last 72 hours. Thyroid  Function Tests: No results for input(s): TSH, T4TOTAL, FREET4, T3FREE, THYROIDAB in the last 72 hours. Anemia Panel: No results for input(s): VITAMINB12, FOLATE, FERRITIN, TIBC, IRON, RETICCTPCT in the last 72 hours. Sepsis Labs: No results for input(s): PROCALCITON, LATICACIDVEN in the last 168 hours.  No results found for this or any previous visit (from the past 240 hours).       Radiology Studies: No results found.         LOS: 17 days   Time spent=  35 mins    Sabrina JAYSON Dare, MD [facility]  If 7PM-7AM, please contact night-coverage  07/05/2024, 10:40 AM

## 2024-07-05 NOTE — Progress Notes (Signed)
 Daily Progress Note   Date: 07/05/2024   Patient Name: Sabrina Hart  DOB: Oct 04, 1932  MRN: [id number]  Age / Sex: 89 y.o., female  Attending Physician: Caleen Burgess BROCKS, MD Primary Care Physician: [name], MD Admit Date: 06/18/2024 Length of Stay: 17 days  Reason for Follow-up: Non pain symptom management, Pain control, Psychosocial/spiritual support, and Terminal Care  Past Medical History:  Diagnosis Date   Anginal pain [facility])    1990's; 09/28/2012   Arthritis    very mild (09/28/2012)   Coronary artery disease 09/27/2012   s/p PCI of the RCA with DES with remote PCI in [place] 15 to 20 years ago. 2. cath 02/2013 patent mid RCA stent, 30% left main, 70% mid to distaal tanden 70% lesions x 3, 60% mid diag, 50% ostial LCx, 40% mid LCx.    Glaucoma    Hyperlipidemia    Hypothyroidism     Subjective:   Subjective: Chart Reviewed. Updates received. Patient Assessed. Created space and opportunity for patient  and family to explore thoughts and feelings regarding current medical situation.  Today's Discussion: Today before meeting with the patient/family, I reviewed the chart notes including hospital significant yesterday, nursing note from yesterday, nursing note from today. I also reviewed vital signs, nursing flowsheets, medication administrations record, labs, and imaging.  No labs due to comfort status.  Patient's vitals are temperature of 98, heart rate of 74, respiratory rate 18, blood pressure 130/55, satting 99% on room air.  The patient received fentanyl  25 mcg yesterday at 6:30 in the morning and again today at 5:08 AM.  She also received Ativan  today at 5:08 AM.  No other symptom management needs.  Today saw the patient at bedside, no family is present.  She appeared comfortable, peaceful.  Respirations are even and unlabored, counted at 11 breaths/min.  No grimacing, groaning, or other objective signs of distress.  Overall patient appears comfortable and peaceful  while on comfort care/approaching end-of-life.  Review of Systems  Unable to perform ROS   Objective:   Primary Diagnoses: Present on Admission:  Hypothyroidism  Primary open angle glaucoma (POAG) of both eyes  Severe late onset Alzheimer's dementia (HCC)  Hyperlipidemia  Essential hypertension  CAD (coronary artery disease)  SIRS (systemic inflammatory response syndrome) (HCC)   Vital Signs:  BP (!) 130/55 (BP Location: Left Leg)   Pulse 74   Temp 98 F (36.7 C)   Resp 18   Ht 5' 3 (1.6 m)   Wt 49.6 kg   SpO2 99%   BMI 19.37 kg/m   Physical Exam Vitals and nursing note reviewed.  Constitutional:      General: She is sleeping. She is not in acute distress.    Appearance: She is ill-appearing.  HENT:     Head: Normocephalic and atraumatic.  Pulmonary:     Effort: Pulmonary effort is normal. No respiratory distress.     Comments: RR 11 Abdominal:     General: Abdomen is flat. There is no distension.     Palliative Assessment/Data: 10%   Existing Vynca/ACP Documentation: MOST form signed 06/17/2024 DNR effective 06/17/2024  Assessment & Plan:   HPI/Patient Profile:   89 y.o. female  with past medical history of hypertension, hyperlipidemia, hypothyroidism, CAD status post stent, dementia, glaucoma, DVT on Eliquis  presented with confusion, wound changes, constipation.  She was admitted on 06/18/2024 with SIRS, severe sepsis secondary to infected decubitus ulcers, right hip abscess, osteomyelitis of the first metatarsal head of the right  foot, acute septic encephalopathy, constipation, AKI, and others.    Palliative medicine was consulted for GOC conversations.  SUMMARY OF RECOMMENDATIONS   DNR-comfort Continue comfort care See symptom management orders below Continue parenteral controlled substances due to previous need and anticipated continued need Palliative medicine will continue to follow  Symptom Management:  Tylenol  650 mg PR every 6 hours as needed  mild pain (1-3) or fever Artificial tears 1 drop OU 4 times daily as needed dry eyes Fentanyl  25 mcg IV every hour as needed severe pain (7-10) Robinul  0.2 mg IV every 4 hours as needed extend secretions Haldol  0.5 mg IV every 4 hours as needed agitation or delirium Ativan  1 mg sublingual every 4 hours as needed anxiety Zofran  4 mg IV every 6 hours as needed nausea or vomiting  Code Status: DNR - Comfort  Prognosis: Hours - Days  Discharge Planning: Anticipated Hospital Death  Discussed with: Medical team, nursing team  Thank you for allowing us  to participate in the care of [facility] PMT will continue to support holistically.  Billing based on MDM: High  Problems Addressed: One acute or chronic illness or injury that poses a threat to life or bodily function  Risks: Parenteral controlled substances   Detailed review of medical records (labs, imaging, vital signs), medically appropriate exam, discussed with treatment team, counseling and education to patient, family, & staff, documenting clinical information, medication management, coordination of care  Camellia Kays, NP Palliative Medicine Team  Team Phone # [phone] (Nights/Weekends)  07/28/2021, 8:17 AM

## 2024-07-05 NOTE — Plan of Care (Signed)
   Problem: Education: Goal: Knowledge of General Education information will improve Description: Including pain rating scale, medication(s)/side effects and non-pharmacologic comfort measures Outcome: Not Progressing

## 2024-07-06 DIAGNOSIS — M160 Bilateral primary osteoarthritis of hip: Secondary | ICD-10-CM | POA: Diagnosis not present

## 2024-07-06 DIAGNOSIS — Z515 Encounter for palliative care: Secondary | ICD-10-CM | POA: Diagnosis not present

## 2024-07-06 DIAGNOSIS — E039 Hypothyroidism, unspecified: Secondary | ICD-10-CM | POA: Diagnosis not present

## 2024-07-06 DIAGNOSIS — R6510 Systemic inflammatory response syndrome (SIRS) of non-infectious origin without acute organ dysfunction: Secondary | ICD-10-CM | POA: Diagnosis not present

## 2024-07-06 DIAGNOSIS — I96 Gangrene, not elsewhere classified: Secondary | ICD-10-CM | POA: Diagnosis not present

## 2024-07-06 DIAGNOSIS — Z7189 Other specified counseling: Secondary | ICD-10-CM | POA: Diagnosis not present

## 2024-07-06 DIAGNOSIS — I10 Essential (primary) hypertension: Secondary | ICD-10-CM | POA: Diagnosis not present

## 2024-07-06 NOTE — TOC Progression Note (Addendum)
 Transition of Care [facility]) - Progression Note    Patient Details  Name: Mallerie Blok MRN: [id number] Date of Birth: 22-Nov-1932  Transition of Care [facility]) CM/SW Contact  Rosaline JONELLE Joe, RN Phone Number: 07/06/2024, 1:45 PM  Clinical Narrative:    Patient remains inpatient at the hospital  - waiting hospital death.  Palliative Medicine continues to follow the patient - per note - patient has hours-days pending hospital death.  Patient is under comfort care orders/care.  Patient has received Ativan /Fentanyl  for comfort in the last 24 hours.                     Expected Discharge Plan and Services         Expected Discharge Date: 06/28/24                                     Social Drivers of Health (SDOH) Interventions SDOH Screenings   Food Insecurity: No Food Insecurity (06/18/2024)  Housing: Low Risk  (06/18/2024)  Transportation Needs: No Transportation Needs (06/18/2024)  Utilities: Not At Risk (06/18/2024)  Depression (PHQ2-9): High Risk (03/09/2023)  Social Connections: Unknown (06/18/2024)  Tobacco Use: Low Risk  (06/18/2024)    Readmission Risk Interventions    06/26/2024    3:45 PM  Readmission Risk Prevention Plan  Transportation Screening Complete  PCP or Specialist Appt within 5-7 Days Complete  Home Care Screening Complete  Medication Review (RN CM) Complete

## 2024-07-06 NOTE — Plan of Care (Signed)
  Problem: Education: Goal: Knowledge of General Education information will improve Description: Including pain rating scale, medication(s)/side effects and non-pharmacologic comfort measures Outcome: Progressing   Problem: Health Behavior/Discharge Planning: Goal: Ability to manage health-related needs will improve Outcome: Progressing   Problem: Pain Managment: Goal: General experience of comfort will improve and/or be controlled Outcome: Progressing

## 2024-07-06 NOTE — Progress Notes (Signed)
 Daily Progress Note   Date: 07/06/2024   Patient Name: Sabrina Hart  DOB: 06-21-1932  MRN: [id number]  Age / Sex: 89 y.o., female  Attending Physician: Darci Pore, MD Primary Care Physician: [name], MD Admit Date: 06/18/2024 Length of Stay: 18 days  Reason for Follow-up: Non pain symptom management, Pain control, Psychosocial/spiritual support, and Terminal Care  Past Medical History:  Diagnosis Date   Anginal pain [facility])    1990's; 09/28/2012   Arthritis    very mild (09/28/2012)   Coronary artery disease 09/27/2012   s/p PCI of the RCA with DES with remote PCI in [place] 15 to 20 years ago. 2. cath 02/2013 patent mid RCA stent, 30% left main, 70% mid to distaal tanden 70% lesions x 3, 60% mid diag, 50% ostial LCx, 40% mid LCx.    Glaucoma    Hyperlipidemia    Hypothyroidism     Subjective:   Subjective: Chart Reviewed. Updates received. Patient Assessed. Created space and opportunity for patient  and family to explore thoughts and feelings regarding current medical situation.  Today's Discussion: Today before meeting with the patient/family, I reviewed the chart notes including hospitalist note from yesterday, nursing note from yesterday, and nursing note from today. I also reviewed vital signs, nursing flowsheets, medication administrations record, labs, and imaging.  No labs due to comfort status.  Patient's vitals are temperature of 97.4, heart rate of 76, respiratory rate 17, blood pressure 136/69, satting 98% on room air.  The patient received fentanyl  25 mcg yesterday at 5:08 AM.  She also received Ativan  yesterday at 5:08 AM.  No other symptom management medications given in the past 24 hours.  Today saw the patient at bedside, no family is present.  She appeared comfortable, peaceful.  Respirations are even and unlabored, counted at 10 breaths/min.  No grimacing, groaning, or other objective signs of distress.  Overall patient appears comfortable  and peaceful while on comfort care/approaching end-of-life.  Review of Systems  Unable to perform ROS   Objective:   Primary Diagnoses: Present on Admission:  Hypothyroidism  Primary open angle glaucoma (POAG) of both eyes  Severe late onset Alzheimer's dementia (HCC)  Hyperlipidemia  Essential hypertension  CAD (coronary artery disease)  SIRS (systemic inflammatory response syndrome) (HCC)   Vital Signs:  BP 136/69 (BP Location: Right Leg)   Pulse 76   Temp (!) 97.4 F (36.3 C) (Oral)   Resp 17   Ht 5' 3 (1.6 m)   Wt 49.6 kg   SpO2 98%   BMI 19.37 kg/m   Physical Exam Vitals and nursing note reviewed.  Constitutional:      General: She is sleeping. She is not in acute distress.    Appearance: She is ill-appearing.  HENT:     Head: Normocephalic and atraumatic.  Pulmonary:     Effort: Pulmonary effort is normal. No respiratory distress.     Comments: RR 10 Abdominal:     General: Abdomen is flat. There is no distension.     Palliative Assessment/Data: 10%   Existing Vynca/ACP Documentation: MOST form signed 06/17/2024 DNR effective 06/17/2024  Assessment & Plan:   HPI/Patient Profile:   89 y.o. female  with past medical history of hypertension, hyperlipidemia, hypothyroidism, CAD status post stent, dementia, glaucoma, DVT on Eliquis  presented with confusion, wound changes, constipation.  She was admitted on 06/18/2024 with SIRS, severe sepsis secondary to infected decubitus ulcers, right hip abscess, osteomyelitis of the first metatarsal head of the right  foot, acute septic encephalopathy, constipation, AKI, and others.    Palliative medicine was consulted for GOC conversations.  SUMMARY OF RECOMMENDATIONS   DNR-comfort Continue comfort care See symptom management orders below Continue parenteral controlled substances due to previous need and anticipated continued need Palliative medicine will continue to follow  Symptom Management:  Tylenol  650 mg PR  every 6 hours as needed mild pain (1-3) or fever Artificial tears 1 drop OU 4 times daily as needed dry eyes Fentanyl  25 mcg IV every hour as needed severe pain (7-10) Robinul  0.2 mg IV every 4 hours as needed extend secretions Haldol  0.5 mg IV every 4 hours as needed agitation or delirium Ativan  1 mg sublingual every 4 hours as needed anxiety Zofran  4 mg IV every 6 hours as needed nausea or vomiting  Code Status: DNR - Comfort  Prognosis: Hours - Days  Discharge Planning: Anticipated Hospital Death  Discussed with: Medical team, nursing team  Thank you for allowing us  to participate in the care of [facility] PMT will continue to support holistically.  Billing based on MDM: High  Problems Addressed: One acute or chronic illness or injury that poses a threat to life or bodily function  Risks: Parenteral controlled substances   Detailed review of medical records (labs, imaging, vital signs), medically appropriate exam, discussed with treatment team, counseling and education to patient, family, & staff, documenting clinical information, medication management, coordination of care  Camellia Kays, NP Palliative Medicine Team  Team Phone # [phone] (Nights/Weekends)  07/28/2021, 8:17 AM

## 2024-07-06 NOTE — Plan of Care (Signed)
  Problem: Education: Goal: Knowledge of General Education information will improve Description: Including pain rating scale, medication(s)/side effects and non-pharmacologic comfort measures Outcome: Not Progressing   Problem: Health Behavior/Discharge Planning: Goal: Ability to manage health-related needs will improve Outcome: Not Progressing   Problem: Pain Managment: Goal: General experience of comfort will improve and/or be controlled Outcome: Not Progressing

## 2024-07-06 NOTE — Progress Notes (Signed)
 Progress Note   Patient: Sabrina Hart FMW:[id number] DOB: Apr 02, 1932 DOA: 06/18/2024     18 DOS: the patient was seen and examined on 07/06/2024   Brief hospital course: Brief Narrative:  89 year old F with PMH of dementia, CAD/stent, HTN, HLD, hypothyroidism, glaucoma, DVT on Eliquis  and chronic bilateral gluteal decubitus and right foot ulcer presenting with increased CPH/bloody drainage after starting Eliquis  for DVT, decline in mentation and activity 4 weeks, and admitted with severe sepsis due to infected decubitus ulcer, right hip abscess and osteomyelitis of first metatarsal head of the right foot.    CT abdomen pelvis showed crescentic abscess at the right hip measuring 12 x 2 x 12 cm, prominent stool favoring constipation, mesenteric edema, severe arthropathy of the hips possible synovitis in the right hip.    Patient received vancomycin , aztreonam  in the ED in the setting of penicillin allergy. Was also diagnosed with right foot abscess and osteomyelitis.  Orthopedic and general surgery consulted, and did not feel there is indication for I&D and recommended wound care and palliative care consult.  After meeting with palliative care, family decided to continue current scope of care with antibiotics.  ID consulted on 7/26, and escalated antibiotics to daptomycin , ceftriaxone  and Flagyl .   After goal of care discussion with patient's daughter, transition to full comfort care on 7/29.  TOC consulted for residential hospice placement. At first daughter agreeable for residential placement but later on 7/30 when she changed her mind. No safe dispo at this time. She remains comfort care.    Assessment & Plan:  Principal Problem:   SIRS (systemic inflammatory response syndrome) (HCC) Active Problems:   CAD (coronary artery disease)   Hypothyroidism   Hyperlipidemia   Severe late onset Alzheimer's dementia (HCC)   Essential hypertension   Primary open angle glaucoma (POAG) of both eyes    Pressure injury of right hip, unstageable (HCC)   Gangrene of right foot (HCC)   Pressure injury of skin    End-of-life care-transitioned to full comfort care on 7/29 after discussion with patient's daughter at bedside. Comfort pathway with as needed medications-ordered. Family interested in residential hospice.  TOC, palliative on board.   Severe sepsis due to infected decubitus ulcers, right hip abscess and right foot abscess and osteomyelitis:POA Acute septic encephalopathy, POA: s -MRI of right foot ulceration medial to the first MTP joint with underlying abscess and osteomyelitis of the first metatarsal head, its sesmoids and the base of first proximal phalanx Off all Abx now.  LUE PICC in place for med administration. -Transitioned to full comfort care on 7/29.  Positive blood culture: Blood culture with Staphylococcus capitis in 1 out of 2 bottles likely contaminant   Right kidney lesion: Favoring a cyst but is indeterminant, no further work up as she is transitioned to comfort only.      Diet:  Diet Orders (From admission, onward)     Start     Ordered   07/04/24 1301  Diet regular Room service appropriate? Yes; Fluid consistency: Thin  Diet effective now       Question Answer Comment  Room service appropriate? Yes   Fluid consistency: Thin      07/04/24 1300           DVT prophylaxis: she is comfort care.  Level of care: Med-Surg   Code Status: Do not attempt resuscitation (DNR) - Comfort care  Subjective: Patient is seen and examined today morning. Not interactive. Seems comfortable. No family at bedside.  Physical Exam: Vitals:   07/03/24 0726 07/03/24 1954 07/04/24 2000 07/06/24 0157  BP: (!) 144/63 136/64 (!) 130/55 136/69  Pulse: 72 81 74 76  Resp:  17 18 17   Temp: 97.6 F (36.4 C) 98 F (36.7 C) 98 F (36.7 C) (!) 97.4 F (36.3 C)  TempSrc: Oral   Oral  SpO2: 100% 97% 99% 98%  Weight:      Height:        General - Elderly female,  comfortably sleeping. HEENT - PERRLA, EOMI, atraumatic head, non tender sinuses. Lung - Clear, basal rales, rhonchi, wheezes. Heart - S1, S2 heard, no murmurs, rubs, 1+ pedal edema. Abdomen - Soft, non tender, bowel sounds good Neuro - lethargic, unable to do full neuro exam.. Skin - Warm and dry.  Data Reviewed:      Latest Ref Rng & Units 06/26/2024    3:06 AM 06/25/2024    9:02 AM 06/24/2024    2:48 AM  CBC  WBC 4.0 - 10.5 K/uL 5.2  6.5  3.9   Hemoglobin 12.0 - 15.0 g/dL 9.5  89.4  9.7   Hematocrit 36.0 - 46.0 % 29.4  32.6  29.3   Platelets 150 - 400 K/uL 456  498  425       Latest Ref Rng & Units 06/25/2024    9:02 AM 06/24/2024    2:48 AM 06/20/2024    6:04 AM  BMP  Glucose 70 - 99 mg/dL 79  86  874   BUN 8 - 23 mg/dL 10  15  12    Creatinine 0.44 - 1.00 mg/dL 9.44  9.46  9.52   Sodium 135 - 145 mmol/L 139  139  140   Potassium 3.5 - 5.1 mmol/L 3.7  2.9  3.7   Chloride 98 - 111 mmol/L 107  105  106   CO2 22 - 32 mmol/L 22  24  23    Calcium  8.9 - 10.3 mg/dL 7.9  7.9  8.3    No results found. Disposition: Status is: Inpatient Remains inpatient appropriate because: comfort care.  Planned Discharge Destination: Hospice     Time spent: 40 minutes  Author: Concepcion Riser, MD 07/06/2024 12:39 PM Secure chat 7am to 7pm For on call review [email or web address].

## 2024-07-07 DIAGNOSIS — Z7189 Other specified counseling: Secondary | ICD-10-CM | POA: Diagnosis not present

## 2024-07-07 DIAGNOSIS — I96 Gangrene, not elsewhere classified: Secondary | ICD-10-CM | POA: Diagnosis not present

## 2024-07-07 DIAGNOSIS — E039 Hypothyroidism, unspecified: Secondary | ICD-10-CM | POA: Diagnosis not present

## 2024-07-07 DIAGNOSIS — R6510 Systemic inflammatory response syndrome (SIRS) of non-infectious origin without acute organ dysfunction: Secondary | ICD-10-CM | POA: Diagnosis not present

## 2024-07-07 DIAGNOSIS — I10 Essential (primary) hypertension: Secondary | ICD-10-CM | POA: Diagnosis not present

## 2024-07-07 NOTE — Progress Notes (Signed)
 Progress Note   Patient: Sabrina Hart FMW:[id number] DOB: 10/21/1932 DOA: 06/18/2024     19 DOS: the patient was seen and examined on 07/07/2024   Brief hospital course: Brief Narrative:  89 year old F with PMH of dementia, CAD/stent, HTN, HLD, hypothyroidism, glaucoma, DVT on Eliquis  and chronic bilateral gluteal decubitus and right foot ulcer presenting with increased CPH/bloody drainage after starting Eliquis  for DVT, decline in mentation and activity 4 weeks, and admitted with severe sepsis due to infected decubitus ulcer, right hip abscess and osteomyelitis of first metatarsal head of the right foot.    CT abdomen pelvis showed crescentic abscess at the right hip measuring 12 x 2 x 12 cm, prominent stool favoring constipation, mesenteric edema, severe arthropathy of the hips possible synovitis in the right hip.    Patient received vancomycin , aztreonam  in the ED in the setting of penicillin allergy. Was also diagnosed with right foot abscess and osteomyelitis.  Orthopedic and general surgery consulted, and did not feel there is indication for I&D and recommended wound care and palliative care consult.  After meeting with palliative care, family decided to continue current scope of care with antibiotics.  ID consulted on 7/26, and escalated antibiotics to daptomycin , ceftriaxone  and Flagyl .   After goal of care discussion with patient's daughter, transition to full comfort care on 7/29.  TOC consulted for residential hospice placement. At first daughter agreeable for residential placement but later on 7/30 when she changed her mind. No safe dispo at this time. She remains comfort care.    Assessment & Plan:  Principal Problem:   SIRS (systemic inflammatory response syndrome) (HCC) Active Problems:   CAD (coronary artery disease)   Hypothyroidism   Hyperlipidemia   Severe late onset Alzheimer's dementia (HCC)   Essential hypertension   Primary open angle glaucoma (POAG) of both eyes    Pressure injury of right hip, unstageable (HCC)   Gangrene of right foot (HCC)   Pressure injury of skin    End-of-life care-transitioned to full comfort care on 7/29 after discussion with patient's daughter at bedside. Comfort pathway with as needed medications-ordered. Family interested in residential hospice.  TOC, palliative on board.   Severe sepsis due to infected decubitus ulcers, right hip abscess and right foot abscess and osteomyelitis:POA Acute septic encephalopathy, POA: MRI of right foot ulceration medial to the first MTP joint with underlying abscess and osteomyelitis of the first metatarsal head, its sesmoids and the base of first proximal phalanx. Blood culture with Staphylococcus capitis in 1 out of 2 bottles likely contaminant. Off all Abx now.  LUE PICC in place for med administration. Transitioned to full comfort care on 7/29.  Right kidney lesion: Favoring a cyst but is indeterminant, no further work up as she is transitioned to comfort only.      Diet:  Diet Orders (From admission, onward)     Start     Ordered   07/04/24 1301  Diet regular Room service appropriate? Yes; Fluid consistency: Thin  Diet effective now       Question Answer Comment  Room service appropriate? Yes   Fluid consistency: Thin      07/04/24 1300           DVT prophylaxis: she is comfort care.  Level of care: Med-Surg   Code Status: Do not attempt resuscitation (DNR) - Comfort care  Subjective: Patient is seen and examined today morning. She is more awake, able to tell her name. Denies any complaints. No family at  bedside.  Physical Exam: Vitals:   07/04/24 2000 07/06/24 0157 07/06/24 2015 07/07/24 0800  BP: (!) 130/55 136/69 137/73 (!) 152/58  Pulse: 74 76 (!) 116 74  Resp: 18 17 18 16   Temp: 98 F (36.7 C) (!) 97.4 F (36.3 C) 97.8 F (36.6 C) 97.6 F (36.4 C)  TempSrc:  Oral Oral   SpO2: 99% 98% 94% 100%  Weight:      Height:        General - Elderly African  American female, lying comfortably HEENT - PERRLA, EOMI, atraumatic head, non tender sinuses. Lung - Clear, basal rales, rhonchi, wheezes. Heart - S1, S2 heard, no murmurs, rubs, 1+ pedal edema. Abdomen - Soft, non tender, bowel sounds good Neuro - alert, awake, able to follow simple commands. Skin - Warm and dry.  Data Reviewed:      Latest Ref Rng & Units 06/26/2024    3:06 AM 06/25/2024    9:02 AM 06/24/2024    2:48 AM  CBC  WBC 4.0 - 10.5 K/uL 5.2  6.5  3.9   Hemoglobin 12.0 - 15.0 g/dL 9.5  89.4  9.7   Hematocrit 36.0 - 46.0 % 29.4  32.6  29.3   Platelets 150 - 400 K/uL 456  498  425       Latest Ref Rng & Units 06/25/2024    9:02 AM 06/24/2024    2:48 AM 06/20/2024    6:04 AM  BMP  Glucose 70 - 99 mg/dL 79  86  874   BUN 8 - 23 mg/dL 10  15  12    Creatinine 0.44 - 1.00 mg/dL 9.44  9.46  9.52   Sodium 135 - 145 mmol/L 139  139  140   Potassium 3.5 - 5.1 mmol/L 3.7  2.9  3.7   Chloride 98 - 111 mmol/L 107  105  106   CO2 22 - 32 mmol/L 22  24  23    Calcium  8.9 - 10.3 mg/dL 7.9  7.9  8.3    No results found. Disposition: Status is: Inpatient Remains inpatient appropriate because: comfort care.  Planned Discharge Destination: Hospice     Time spent: 38 minutes  Author: Concepcion Riser, MD 07/07/2024 12:42 PM Secure chat 7am to 7pm For on call review [email or web address].

## 2024-07-07 NOTE — Plan of Care (Signed)
  Problem: Education: Goal: Knowledge of General Education information will improve Description: Including pain rating scale, medication(s)/side effects and non-pharmacologic comfort measures Outcome: Progressing   Problem: Health Behavior/Discharge Planning: Goal: Ability to manage health-related needs will improve Outcome: Progressing   Problem: Pain Managment: Goal: General experience of comfort will improve and/or be controlled Outcome: Progressing

## 2024-07-07 NOTE — Progress Notes (Signed)
 Daily Progress Note   Patient Name: Sabrina Hart       Date: 07/07/2024 DOB: [birth year]  Age: 89 y.o. MRN#: [id number] Attending Physician: Darci Pore, MD Primary Care Physician: Sabrina Clotilda [name], MD Admit Date: 06/18/2024  Reason for Consultation/Follow-up: Establishing goals of care   Length of Stay: 19  Current Medications: Scheduled Meds:   alteplase   2 mg Intracatheter Once   collagenase    Topical Daily    Continuous Infusions:   PRN Meds: acetaminophen  **OR** acetaminophen , artificial tears, feeding supplement, fentaNYL  (SUBLIMAZE ) injection, glycopyrrolate  **OR** glycopyrrolate  **OR** glycopyrrolate , haloperidol  **OR** haloperidol  **OR** haloperidol  lactate, ipratropium-albuterol , LORazepam  **OR** LORazepam  **OR** [DISCONTINUED] LORazepam , ondansetron  **OR** ondansetron  (ZOFRAN ) IV  Physical Exam Vitals reviewed.  Constitutional:      General: She is not in acute distress.    Appearance: She is ill-appearing.  HENT:     Head: Normocephalic and atraumatic.  Pulmonary:     Effort: Pulmonary effort is normal.  Skin:    General: Skin is warm and dry.  Neurological:     Mental Status: She is alert.             Vital Signs: BP (!) 152/58 (BP Location: Left Leg)   Pulse 74   Temp 97.6 F (36.4 C)   Resp 16   Ht 5' 3 (1.6 m)   Wt 49.6 kg   SpO2 100%   BMI 19.37 kg/m  SpO2: SpO2: 100 % O2 Device: O2 Device: Room Air O2 Flow Rate:    Palliative Assessment/Data: 20%      Patient Active Problem List   Diagnosis Date Noted   Pressure injury of skin 06/19/2024   SIRS (systemic inflammatory response syndrome) (HCC) 06/18/2024   Pressure injury of right hip, unstageable (HCC) 06/18/2024   Gangrene of right foot (HCC) 06/18/2024   Bilateral  primary osteoarthritis of hip 08/28/2023   Essential hypertension 08/28/2023   Primary open angle glaucoma (POAG) of both eyes 08/28/2023   Severe late onset Alzheimer's dementia (HCC) 04/12/2019   Lower extremity edema 04/26/2017   Unstable angina (HCC) 03/02/2013   Hypothyroidism 09/29/2012   Hyperlipidemia 09/29/2012   Hypokalemia 09/29/2012   CAD (coronary artery disease) 09/27/2012    Palliative Care Assessment & Plan   Patient Profile: 89 y.o. female  with past medical history of hypertension, hyperlipidemia,  hypothyroidism, CAD status post stent, dementia, glaucoma, DVT on Eliquis  presented with confusion, wound changes, constipation.  She was admitted on 06/18/2024 with SIRS, severe sepsis secondary to infected decubitus ulcers, right hip abscess, osteomyelitis of the first metatarsal head of the right foot, acute septic encephalopathy, constipation, AKI, and others.    Palliative medicine was consulted for GOC conversations.  Today's Discussion: Reviewed chart and received update from patient's nurse aid. Patient did not require any prn medications over the last 24 hours. Nurse aide shared patient is only eating a few bites of food a day.   Patient awake lying in bed in no apparent distress. She appeared comfortable. She spoke but her words did not make sense. I shared our goal was to keep her comfortable and that we had many available medications to do so. No family at bedside.  PMT will continue to follow  Recommendations/Plan: DNR-comfort Continue comfort care See symptom management orders below Palliative medicine will continue to follow   Symptom Management:  Tylenol  650 mg PR every 6 hours as needed mild pain (1-3) or fever Artificial tears 1 drop OU 4 times daily as needed dry eyes Fentanyl  25 mcg IV every hour as needed severe pain (7-10) Robinul  0.2 mg IV every 4 hours as needed extend secretions Haldol  0.5 mg IV every 4 hours as needed agitation or  delirium Ativan  1 mg sublingual or 0.5 mg IV every 4 hours as needed anxiety Zofran  4 mg IV every 6 hours as needed nausea or vomiting  Code Status:    Code Status Orders  (From admission, onward)           Start     Ordered   06/26/24 1534  Do not attempt resuscitation (DNR) - Comfort care  Continuous       Question Answer Comment  If patient has no pulse and is not breathing Do Not Attempt Resuscitation   In Pre-Arrest Conditions (Patient Is Breathing and Has a Pulse) Provide comfort measures. Relieve any mechanical airway obstruction. Avoid transfer unless required for comfort.   Consent: Discussion documented in EHR or advanced directives reviewed      06/26/24 1536            Extensive chart review has been completed prior to seeing the patient including vital signs, progress/consult notes, orders, medications, and available advance directive documents.  Time spent: 25 minutes  Thank you for allowing the Palliative Medicine Team to assist in the care of this patient.    Stephane CHRISTELLA Palin, NP  Please contact Palliative Medicine Team phone at [phone] for questions and concerns.

## 2024-07-08 DIAGNOSIS — Z515 Encounter for palliative care: Secondary | ICD-10-CM | POA: Diagnosis not present

## 2024-07-08 DIAGNOSIS — R6510 Systemic inflammatory response syndrome (SIRS) of non-infectious origin without acute organ dysfunction: Secondary | ICD-10-CM | POA: Diagnosis not present

## 2024-07-08 DIAGNOSIS — Z7189 Other specified counseling: Secondary | ICD-10-CM | POA: Diagnosis not present

## 2024-07-08 NOTE — Plan of Care (Signed)
  Problem: Education: Goal: Knowledge of General Education information will improve Description: Including pain rating scale, medication(s)/side effects and non-pharmacologic comfort measures Outcome: Progressing   Problem: Health Behavior/Discharge Planning: Goal: Ability to manage health-related needs will improve Outcome: Progressing   Problem: Pain Managment: Goal: General experience of comfort will improve and/or be controlled Outcome: Progressing

## 2024-07-08 NOTE — Progress Notes (Signed)
 Progress Note   Patient: Sabrina Hart FMW:[id number] DOB: 1932/09/16 DOA: 06/18/2024     20 DOS: the patient was seen and examined on 07/08/2024   Brief hospital course: Brief Narrative:  As per prior documentation: 89 year old F with PMH of dementia, CAD/stent, HTN, HLD, hypothyroidism, glaucoma, DVT on Eliquis  and chronic bilateral gluteal decubitus and right foot ulcer presenting with increased CPH/bloody drainage after starting Eliquis  for DVT, decline in mentation and activity 4 weeks, and admitted with severe sepsis due to infected decubitus ulcer, right hip abscess and osteomyelitis of first metatarsal head of the right foot.    CT abdomen pelvis showed crescentic abscess at the right hip measuring 12 x 2 x 12 cm, prominent stool favoring constipation, mesenteric edema, severe arthropathy of the hips possible synovitis in the right hip.    Patient received vancomycin , aztreonam  in the ED in the setting of penicillin allergy. Was also diagnosed with right foot abscess and osteomyelitis.  Orthopedic and general surgery consulted, and did not feel there is indication for I&D and recommended wound care and palliative care consult.  After meeting with palliative care, family decided to continue current scope of care with antibiotics.  ID consulted on 7/26, and escalated antibiotics to daptomycin , ceftriaxone  and Flagyl .   After goal of care discussion with patient's daughter, transition to full comfort care on 7/29.  TOC consulted for residential hospice placement. At first daughter agreeable for residential placement but later on 7/30 when she changed her mind. No safe dispo at this time. She remains comfort care.   07/08/2024: Patient is for comfort care.  Patient is resting quietly.  Assessment & Plan:  Principal Problem:   SIRS (systemic inflammatory response syndrome) (HCC) Active Problems:   CAD (coronary artery disease)   Hypothyroidism   Hyperlipidemia   Severe late onset  Alzheimer's dementia (HCC)   Essential hypertension   Primary open angle glaucoma (POAG) of both eyes   Pressure injury of right hip, unstageable (HCC)   Gangrene of right foot (HCC)   Pressure injury of skin    End-of-life care-transitioned to full comfort care on 7/29 after discussion with patient's daughter at bedside. Comfort pathway with as needed medications-ordered. Family interested in residential hospice.  TOC, palliative on board.   Severe sepsis due to infected decubitus ulcers, right hip abscess and right foot abscess and osteomyelitis:POA Acute septic encephalopathy, POA: MRI of right foot ulceration medial to the first MTP joint with underlying abscess and osteomyelitis of the first metatarsal head, its sesmoids and the base of first proximal phalanx. Blood culture with Staphylococcus capitis in 1 out of 2 bottles likely contaminant. Off all Abx now.  LUE PICC in place for med administration. Transitioned to full comfort care on 7/29.  Right kidney lesion: Favoring a cyst but is indeterminant, no further work up as she is transitioned to comfort only.      Diet:  Diet Orders (From admission, onward)     Start     Ordered   07/04/24 1301  Diet regular Room service appropriate? Yes; Fluid consistency: Thin  Diet effective now       Question Answer Comment  Room service appropriate? Yes   Fluid consistency: Thin      07/04/24 1300           DVT prophylaxis: she is comfort care.  Level of care: Med-Surg   Code Status: Do not attempt resuscitation (DNR) - Comfort care  Subjective: Patient is seen and examined today morning.  She is more awake, able to tell her name. Denies any complaints. No family at bedside.  Physical Exam: Vitals:   07/06/24 0157 07/06/24 2015 07/07/24 0800 07/07/24 2036  BP: 136/69 137/73 (!) 152/58 (!) 157/66  Pulse: 76 (!) 116 74 86  Resp: 17 18 16 17   Temp: (!) 97.4 F (36.3 C) 97.8 F (36.6 C) 97.6 F (36.4 C) 98.5 F (36.9  C)  TempSrc: Oral Oral    SpO2: 98% 94% 100% 99%  Weight:      Height:        General - Elderly African American female, lying comfortably HEENT - PERRLA, EOMI, atraumatic head, non tender sinuses. Lung - Clear, basal rales, rhonchi, wheezes. Heart - S1, S2 heard, no murmurs, rubs, 1+ pedal edema. Abdomen - Soft, non tender, bowel sounds good Neuro - alert, awake, able to follow simple commands. Skin - Warm and dry.  Data Reviewed:      Latest Ref Rng & Units 06/26/2024    3:06 AM 06/25/2024    9:02 AM 06/24/2024    2:48 AM  CBC  WBC 4.0 - 10.5 K/uL 5.2  6.5  3.9   Hemoglobin 12.0 - 15.0 g/dL 9.5  89.4  9.7   Hematocrit 36.0 - 46.0 % 29.4  32.6  29.3   Platelets 150 - 400 K/uL 456  498  425       Latest Ref Rng & Units 06/25/2024    9:02 AM 06/24/2024    2:48 AM 06/20/2024    6:04 AM  BMP  Glucose 70 - 99 mg/dL 79  86  874   BUN 8 - 23 mg/dL 10  15  12    Creatinine 0.44 - 1.00 mg/dL 9.44  9.46  9.52   Sodium 135 - 145 mmol/L 139  139  140   Potassium 3.5 - 5.1 mmol/L 3.7  2.9  3.7   Chloride 98 - 111 mmol/L 107  105  106   CO2 22 - 32 mmol/L 22  24  23    Calcium  8.9 - 10.3 mg/dL 7.9  7.9  8.3    No results found. Disposition: Status is: Inpatient Remains inpatient appropriate because: comfort care.  Planned Discharge Destination: Hospice     Time spent: 35 minutes  Author: Leatrice LILLETTE Chapel, MD 07/08/2024 1:33 PM Secure chat 7am to 7pm For on call review [email or web address].

## 2024-07-08 NOTE — Progress Notes (Signed)
 Daily Progress Note   Patient Name: Sabrina Hart       Date: 07/08/2024 DOB: 10-11-1932  Age: 89 y.o. MRN#: [id number] Attending Physician: Rosario Leatrice FERNS, MD Primary Care Physician: [name], MD Admit Date: 06/18/2024  Reason for Consultation/Follow-up: Establishing goals of care   Length of Stay: 20  Current Medications: Scheduled Meds:   alteplase   2 mg Intracatheter Once   collagenase    Topical Daily    Continuous Infusions:   PRN Meds: acetaminophen  **OR** acetaminophen , artificial tears, feeding supplement, fentaNYL  (SUBLIMAZE ) injection, glycopyrrolate  **OR** glycopyrrolate  **OR** glycopyrrolate , haloperidol  **OR** haloperidol  **OR** haloperidol  lactate, ipratropium-albuterol , LORazepam  **OR** LORazepam  **OR** [DISCONTINUED] LORazepam , ondansetron  **OR** ondansetron  (ZOFRAN ) IV  Physical Exam Vitals reviewed.  Constitutional:      General: She is sleeping. She is not in acute distress.    Appearance: She is ill-appearing.  HENT:     Head: Normocephalic and atraumatic.  Pulmonary:     Effort: Pulmonary effort is normal.             Vital Signs: BP (!) 157/66 (BP Location: Left Leg)   Pulse 86   Temp 98.5 F (36.9 C)   Resp 17   Ht 5' 3 (1.6 m)   Wt 49.6 kg   SpO2 99%   BMI 19.37 kg/m  SpO2: SpO2: 99 % O2 Device: O2 Device: Room Air O2 Flow Rate:    Palliative Assessment/Data: 20%      Patient Active Problem List   Diagnosis Date Noted   Pressure injury of skin 06/19/2024   SIRS (systemic inflammatory response syndrome) (HCC) 06/18/2024   Pressure injury of right hip, unstageable (HCC) 06/18/2024   Gangrene of right foot (HCC) 06/18/2024   Bilateral primary osteoarthritis of hip 08/28/2023   Essential hypertension 08/28/2023    Primary open angle glaucoma (POAG) of both eyes 08/28/2023   Severe late onset Alzheimer's dementia (HCC) 04/12/2019   Lower extremity edema 04/26/2017   Unstable angina (HCC) 03/02/2013   Hypothyroidism 09/29/2012   Hyperlipidemia 09/29/2012   Hypokalemia 09/29/2012   CAD (coronary artery disease) 09/27/2012    Palliative Care Assessment & Plan   Patient Profile: 89 y.o. female  with past medical history of hypertension, hyperlipidemia, hypothyroidism, CAD status post stent, dementia, glaucoma, DVT on Eliquis  presented with confusion, wound changes, constipation.  She  was admitted on 06/18/2024 with SIRS, severe sepsis secondary to infected decubitus ulcers, right hip abscess, osteomyelitis of the first metatarsal head of the right foot, acute septic encephalopathy, constipation, AKI, and others.    Palliative medicine was consulted for GOC conversations.  Today's Discussion: Reviewed chart. Patient did not require any prn medications over the last 24 hours.  Patient sleeping in no apparent distress. She appeared comfortable. Her breathing was unlabored. No family at bedside.  PMT will continue to follow  Recommendations/Plan: DNR-comfort Continue comfort care See symptom management orders below Palliative medicine will continue to follow   Symptom Management:  Tylenol  650 mg PR every 6 hours as needed mild pain (1-3) or fever Artificial tears 1 drop OU 4 times daily as needed dry eyes Fentanyl  25 mcg IV every hour as needed severe pain (7-10) Robinul  0.2 mg IV every 4 hours as needed extend secretions Haldol  0.5 mg IV every 4 hours as needed agitation or delirium Ativan  1 mg sublingual or 0.5 mg IV every 4 hours as needed anxiety Zofran  4 mg IV every 6 hours as needed nausea or vomiting  Code Status:    Code Status Orders  (From admission, onward)           Start     Ordered   06/26/24 1534  Do not attempt resuscitation (DNR) - Comfort care  Continuous        Question Answer Comment  If patient has no pulse and is not breathing Do Not Attempt Resuscitation   In Pre-Arrest Conditions (Patient Is Breathing and Has a Pulse) Provide comfort measures. Relieve any mechanical airway obstruction. Avoid transfer unless required for comfort.   Consent: Discussion documented in EHR or advanced directives reviewed      06/26/24 1536            Extensive chart review has been completed prior to seeing the patient including vital signs, progress/consult notes, orders, medications, and available advance directive documents.  Time spent: 25 minutes  Thank you for allowing the Palliative Medicine Team to assist in the care of this patient.    Stephane CHRISTELLA Palin, NP  Please contact Palliative Medicine Team phone at [phone] for questions and concerns.

## 2024-07-09 ENCOUNTER — Inpatient Hospital Stay (HOSPITAL_COMMUNITY)

## 2024-07-09 DIAGNOSIS — Z515 Encounter for palliative care: Secondary | ICD-10-CM | POA: Diagnosis not present

## 2024-07-09 DIAGNOSIS — R6510 Systemic inflammatory response syndrome (SIRS) of non-infectious origin without acute organ dysfunction: Secondary | ICD-10-CM | POA: Diagnosis not present

## 2024-07-09 DIAGNOSIS — I96 Gangrene, not elsewhere classified: Secondary | ICD-10-CM | POA: Diagnosis not present

## 2024-07-09 DIAGNOSIS — E039 Hypothyroidism, unspecified: Secondary | ICD-10-CM | POA: Diagnosis not present

## 2024-07-09 DIAGNOSIS — I10 Essential (primary) hypertension: Secondary | ICD-10-CM | POA: Diagnosis not present

## 2024-07-09 DIAGNOSIS — Z7189 Other specified counseling: Secondary | ICD-10-CM | POA: Diagnosis not present

## 2024-07-09 MED ORDER — LORAZEPAM 2 MG/ML IJ SOLN: 1.0000 mg | Freq: Four times a day (QID) | INTRAMUSCULAR | Status: DC

## 2024-07-09 NOTE — Progress Notes (Addendum)
 Palliative Medicine Inpatient Follow Up Note HPI: 89 y.o. female  with past medical history of hypertension, hyperlipidemia, hypothyroidism, CAD status post stent, dementia, glaucoma, DVT on Eliquis  presented with confusion, wound changes, constipation.  She was admitted on 06/18/2024 with SIRS, severe sepsis secondary to infected decubitus ulcers, right hip abscess, osteomyelitis of the first metatarsal head of the right foot, acute septic encephalopathy, constipation, AKI, and others.    Palliative medicine was consulted for GOC conversations.  Today's Discussion 07/09/2024  *Please note that this is a verbal dictation therefore any spelling or grammatical errors are due to the Dragon Medical One system interpretation.  Chart reviewed inclusive of vital signs, progress notes, laboratory results, and diagnostic images.  Has required no as needed medications in the last 24 hours.  I met with Ronal at bedside this morning. She was sleeping upon my assessment. She appeared to have no significant nonverbal signs or symptoms of distress through the use of PAIN-AD.   The transitions of care team has spoken to patient's daughter and offered inpatient or home hospice.  Daughter shares the inability to pay for inpatient hospice residential bed at this time.  Plan for ongoing support of Chris during her end-of-life journey. ________________________________ Addendum:  I met with patients daughter, Bisi this late afternoon. We discussed patient present state and ongoing agitation. Patients daughter and I discussed the advantages of adding ativan  around the clock. We discussed it may make Shreshta more sleepy though would hopefully better support or feelings og anxiety/agitation moving forward.  We discussed reconsidering [facility] under the guise if [facility] needing ongoing symptom management. I shared that myself and Rosaline of TOC would reach out to [facility] tomorrow for a re-evaluation. Bisi is clear  that she is unable to care for [facility] in the home.   Discussed patients strength throughout her life and the type of character she has. Reviewed how strong of a mother she was. Allowed time and space for reflection.   Questions and concerns addressed/Palliative Support Provided.   Add time: 55  Objective Assessment: Vital Signs Vitals:   07/09/24 0017 07/09/24 0439  BP: (!) 145/60 (!) 114/44  Pulse: 73 68  Resp:    Temp: 98.6 F (37 C) 98 F (36.7 C)  SpO2: 97% 100%    Intake/Output Summary (Last 24 hours) at 07/09/2024 1050 Last data filed at 07/08/2024 1600 Gross per 24 hour  Intake 120 ml  Output 100 ml  Net 20 ml   Last Weight  Most recent update: 06/18/2024  7:39 PM    Weight  49.6 kg (109 lb 5.6 oz)            Gen: Frail elderly African-American female chronically ill in appearance HEENT: Dry mucous membranes CV: Regular rate and rhythm  PULM: On room air breathing is even and nonlabored ABD: soft/nontender  EXT: Thin extremities Neuro: Somnolent  SUMMARY OF RECOMMENDATIONS   DNR-comfort Continue comfort care See symptom management orders below Palliative medicine will continue to follow   Symptom Management:  Tylenol  650 mg PR every 6 hours as needed mild pain (1-3) or fever Artificial tears 1 drop OU 4 times daily as needed dry eyes Fentanyl  25 mcg IV every hour as needed severe pain (7-10) Robinul  0.2 mg IV every 4 hours as needed extend secretions Haldol  0.5 mg IV every 4 hours as needed agitation or delirium Ativan  1 mg sublingual or 0.5 mg IV every 4 hours as needed anxiety Zofran  4 mg IV every 6  hours as needed nausea or vomiting ______________________________________________________________________________________ Rosaline [facility] [place] Palliative Medicine Team Team Cell Phone: [phone] Please utilize secure chat with additional questions, if there is no response within 30 minutes please call the above phone number  Time Spent:  25  Palliative Medicine Team providers are available by phone from 7am to 7pm daily and can be reached through the team cell phone.  Should this patient require assistance outside of these hours, please call the patient's attending physician.

## 2024-07-09 NOTE — Plan of Care (Signed)
  Problem: Education: Goal: Knowledge of General Education information will improve Description: Including pain rating scale, medication(s)/side effects and non-pharmacologic comfort measures Outcome: Progressing   Problem: Health Behavior/Discharge Planning: Goal: Ability to manage health-related needs will improve Outcome: Progressing   Problem: Pain Managment: Goal: General experience of comfort will improve and/or be controlled Outcome: Progressing

## 2024-07-09 NOTE — Progress Notes (Signed)
 Progress Note   Patient: Sabrina Hart FMW:[id number] DOB: [birth year] DOA: 06/18/2024     21 DOS: the patient was seen and examined on 07/09/2024   Brief hospital course: Brief Narrative:  89 year old F with PMH of dementia, CAD/stent, HTN, HLD, hypothyroidism, glaucoma, DVT on Eliquis  and chronic bilateral gluteal decubitus and right foot ulcer presenting with increased CPH/bloody drainage after starting Eliquis  for DVT, decline in mentation and activity 4 weeks, and admitted with severe sepsis due to infected decubitus ulcer, right hip abscess and osteomyelitis of first metatarsal head of the right foot.    CT abdomen pelvis showed crescentic abscess at the right hip measuring 12 x 2 x 12 cm, prominent stool favoring constipation, mesenteric edema, severe arthropathy of the hips possible synovitis in the right hip.    Patient received vancomycin , aztreonam  in the ED in the setting of penicillin allergy. Was also diagnosed with right foot abscess and osteomyelitis.  Orthopedic and general surgery consulted, and did not feel there is indication for I&D and recommended wound care and palliative care consult.  After meeting with palliative care, family decided to continue current scope of care with antibiotics.  ID consulted on 7/26, and escalated antibiotics to daptomycin , ceftriaxone  and Flagyl .   After goal of care discussion with patient's daughter, transition to full comfort care on 7/29.  TOC consulted for residential hospice placement. At first daughter agreeable for residential placement but later on 7/30 when she changed her mind. No safe dispo at this time. She remains comfort care.    Assessment & Plan:  Principal Problem:   SIRS (systemic inflammatory response syndrome) (HCC) Active Problems:   CAD (coronary artery disease)   Hypothyroidism   Hyperlipidemia   Severe late onset Alzheimer's dementia (HCC)   Essential hypertension   Primary open angle glaucoma (POAG) of both eyes    Pressure injury of right hip, unstageable (HCC)   Gangrene of right foot (HCC)   Pressure injury of skin    End-of-life care-transitioned to full comfort care on 7/29 after discussion with patient's daughter at bedside. Comfort pathway with as needed medications-ordered. Continue decubiti ulcer care. TOC, palliative on board. Daughter does not want inpatient hospice due to cost, may need to go home with hospice.   Severe sepsis due to infected decubitus ulcers, right hip abscess and right foot abscess and osteomyelitis:POA Acute septic encephalopathy, POA: MRI of right foot ulceration medial to the first MTP joint with underlying abscess and osteomyelitis of the first metatarsal head, its sesmoids and the base of first proximal phalanx. Blood culture with Staphylococcus capitis in 1 out of 2 bottles likely contaminant. Off all Abx now.  LUE PICC in place for med administration. Transitioned to full comfort care on 7/29. Per TOC - Patient does not have Medicaid and the daughter declined inpatient hospice due to the cost of room and board.  Right kidney lesion: Favoring a cyst but is indeterminant, no further work up as she is transitioned to comfort only.      Diet:  Diet Orders (From admission, onward)     Start     Ordered   07/04/24 1301  Diet regular Room service appropriate? Yes; Fluid consistency: Thin  Diet effective now       Question Answer Comment  Room service appropriate? Yes   Fluid consistency: Thin      07/04/24 1300           DVT prophylaxis: she is comfort care.  Level of care: Med-Surg  Code Status: Do not attempt resuscitation (DNR) - Comfort care  Subjective: Patient is seen and examined today morning. She is more awake, RN and tech at bedside, changing picc dressing. She is contracted, has multiple decubiti with dressing.  Physical Exam: Vitals:   07/07/24 2036 07/08/24 2004 07/09/24 0017 07/09/24 0439  BP: (!) 157/66 126/75 (!) 145/60 (!)  114/44  Pulse: 86 79 73 68  Resp: 17 18    Temp: 98.5 F (36.9 C) 98.4 F (36.9 C) 98.6 F (37 C) 98 F (36.7 C)  TempSrc:   Oral   SpO2: 99% 100% 97% 100%  Weight:      Height:        General - Elderly African American female, lying comfortably HEENT - PERRLA, EOMI, atraumatic head, non tender sinuses. Lung - Clear, basal rales, rhonchi, wheezes. Heart - S1, S2 heard, no murmurs, rubs, 1+ pedal edema. Abdomen - Soft, non tender, bowel sounds good Neuro - alert, awake, unable to follow simple commands. Skin - Warm and dry. Extremity contractures, decubiti b/l hips, heels noted.  Data Reviewed:      Latest Ref Rng & Units 06/26/2024    3:06 AM 06/25/2024    9:02 AM 06/24/2024    2:48 AM  CBC  WBC 4.0 - 10.5 K/uL 5.2  6.5  3.9   Hemoglobin 12.0 - 15.0 g/dL 9.5  89.4  9.7   Hematocrit 36.0 - 46.0 % 29.4  32.6  29.3   Platelets 150 - 400 K/uL 456  498  425       Latest Ref Rng & Units 06/25/2024    9:02 AM 06/24/2024    2:48 AM 06/20/2024    6:04 AM  BMP  Glucose 70 - 99 mg/dL 79  86  874   BUN 8 - 23 mg/dL 10  15  12    Creatinine 0.44 - 1.00 mg/dL 9.44  9.46  9.52   Sodium 135 - 145 mmol/L 139  139  140   Potassium 3.5 - 5.1 mmol/L 3.7  2.9  3.7   Chloride 98 - 111 mmol/L 107  105  106   CO2 22 - 32 mmol/L 22  24  23    Calcium  8.9 - 10.3 mg/dL 7.9  7.9  8.3    No results found. Disposition: Status is: Inpatient Remains inpatient appropriate because: comfort care.  Planned Discharge Destination: Home with Hospice pending family discussion     Time spent: 39 minutes  Author: Concepcion Riser, MD 07/09/2024 12:51 PM Secure chat 7am to 7pm For on call review [email or web address].

## 2024-07-09 NOTE — TOC Progression Note (Addendum)
 Transition of Care [facility]) - Progression Note    Patient Details  Name: Sabrina Hart MRN: [id number] Date of Birth: 1932-11-04  Transition of Care [facility]) CM/SW Contact  Rosaline JONELLE Joe, RN Phone Number: 07/09/2024, 9:08 AM  Clinical Narrative:    CM called and spoke with the patient's daughter but phone regarding the patient returning to home with home hospice.  Patient's daughter states that she will discuss with her family and return my call.  Daughter declined Inpatient hospice placement due to cost.  CM with IP Care management will continue to follow the patient for needs.  07/09/24 1626- CM and Rosaline, NP with Palliative Care met with the patient's daughter at the bedside and patient will likely have medications scheduled for comfort care and I will reach out to [facility] in the am and have patient re-evaluated for Inpatient Hospice placement at [facility].  Daughter was agreeable to speak with [facility] in the am.                     Expected Discharge Plan and Services         Expected Discharge Date: 06/28/24                                     Social Drivers of Health (SDOH) Interventions SDOH Screenings   Food Insecurity: No Food Insecurity (06/18/2024)  Housing: Low Risk  (06/18/2024)  Transportation Needs: No Transportation Needs (06/18/2024)  Utilities: Not At Risk (06/18/2024)  Depression (PHQ2-9): High Risk (03/09/2023)  Social Connections: Unknown (06/18/2024)  Tobacco Use: Low Risk  (06/18/2024)    Readmission Risk Interventions    06/26/2024    3:45 PM  Readmission Risk Prevention Plan  Transportation Screening Complete  PCP or Specialist Appt within 5-7 Days Complete  Home Care Screening Complete  Medication Review (RN CM) Complete

## 2024-07-09 NOTE — Progress Notes (Signed)
 Left PICC removed per order.  Folded 4x4 gauze and vaseline gauze pressure dressing applied with manual pressure held x 5 minutes, no bleeding noted post removal.

## 2024-07-10 DIAGNOSIS — I96 Gangrene, not elsewhere classified: Secondary | ICD-10-CM | POA: Diagnosis not present

## 2024-07-10 DIAGNOSIS — I10 Essential (primary) hypertension: Secondary | ICD-10-CM | POA: Diagnosis not present

## 2024-07-10 DIAGNOSIS — E039 Hypothyroidism, unspecified: Secondary | ICD-10-CM | POA: Diagnosis not present

## 2024-07-10 DIAGNOSIS — R6510 Systemic inflammatory response syndrome (SIRS) of non-infectious origin without acute organ dysfunction: Secondary | ICD-10-CM | POA: Diagnosis not present

## 2024-07-10 MED ORDER — DIAZEPAM 5 MG/ML IJ SOLN
2.5000 mg | Freq: Four times a day (QID) | INTRAMUSCULAR | Status: DC
  Administered 2024-07-10 – 2024-07-11 (×12): 2.5 mg via INTRAVENOUS
  Filled 2024-07-10 (×6): qty 2

## 2024-07-10 MED ORDER — DIAZEPAM 5 MG/ML IJ SOLN
2.5000 mg | Freq: Once | INTRAMUSCULAR | Status: AC
  Administered 2024-07-10 (×2): 2.5 mg via INTRAMUSCULAR
  Filled 2024-07-10: qty 2

## 2024-07-10 NOTE — Progress Notes (Signed)
   Palliative Medicine Inpatient Follow Up Note HPI: 89 y.o. female  with past medical history of hypertension, hyperlipidemia, hypothyroidism, CAD status post stent, dementia, glaucoma, DVT on Eliquis  presented with confusion, wound changes, constipation.  She was admitted on 06/18/2024 with SIRS, severe sepsis secondary to infected decubitus ulcers, right hip abscess, osteomyelitis of the first metatarsal head of the right foot, acute septic encephalopathy, constipation, AKI, and others.    Palliative medicine was consulted for GOC conversations.  Today's Discussion 07/10/2024  *Please note that this is a verbal dictation therefore any spelling or grammatical errors are due to the Dragon Medical One system interpretation.  Chart reviewed inclusive of vital signs, progress notes, laboratory results, and diagnostic images.  Has required diazepam  x2 in the last 24 hours.  I have spoken to the [facility] liaison - right now from their perspective [facility] does not quality for inpatient hospice services. Given the inability of patients daughter to care for [facility] in the home the plan for the time being will be for [facility] to remain in the hospital to continue to receive end of life care.   I met with Ronal at bedside this morning. She was resting calmly and not noted to be in any distress. She appears to be responding well to the present medication regiment.   Per Stavroula's RN, Renee there are no additional concerns this morning.   Questions addresses.  Objective Assessment: Vital Signs Vitals:   07/09/24 2353 07/10/24 0444  BP: (!) 154/124 138/86  Pulse: 87 83  Resp:    Temp: 98.2 F (36.8 C) 98.3 F (36.8 C)  SpO2: 95% 98%   No intake or output data in the 24 hours ending 07/10/24 1104  Last Weight  Most recent update: 06/18/2024  7:39 PM    Weight  49.6 kg (109 lb 5.6 oz)            Gen: Frail elderly African-American female chronically ill in appearance HEENT: Dry mucous  membranes CV: Regular rate and rhythm  PULM: On room air breathing is even and nonlabored ABD: soft/nontender  EXT: Thin extremities Neuro: Somnolent  SUMMARY OF RECOMMENDATIONS   DNR-comfort Continue comfort care See symptom management orders below Palliative medicine will continue to follow   Symptom Management:  Tylenol  650 mg PR every 6 hours as needed mild pain (1-3) or fever Artificial tears 1 drop OU 4 times daily as needed dry eyes Fentanyl  25 mcg IV every hour as needed severe pain (7-10) Robinul  0.2 mg IV every 4 hours as needed extend secretions Haldol  0.5 mg IV every 4 hours as needed agitation or delirium Diazepam  2.5mg  IV Q6H ATC Zofran  4 mg IV every 6 hours as needed nausea or vomiting ______________________________________________________________________________________ Rosaline [facility]ecton [place] Palliative Medicine Team Team Cell Phone: [phone] Please utilize secure chat with additional questions, if there is no response within 30 minutes please call the above phone number  MDM high in the setting of review and modification of controlled substance orders. Valium  is on national shortage therefore was transitioned to diazepam  to more appropriately control symptoms.   Palliative Medicine Team providers are available by phone from 7am to 7pm daily and can be reached through the team cell phone.  Should this patient require assistance outside of these hours, please call the patient's attending physician.

## 2024-07-10 NOTE — TOC Progression Note (Signed)
 Transition of Care [facility]) - Progression Note    Patient Details  Name: Sabrina Hart MRN: [id number] Date of Birth: 22-Feb-1932  [facility]) CM/SW Contact  Rosaline JONELLE Joe, RN Phone Number: 07/10/2024, 9:16 AM  Clinical Narrative:    CM placed referral with [facility] for Inpatient Hospice placement for [facility].  CM with IP Care management will continue to follow the patient for needed placement.                     Expected Discharge Plan and Services         Expected Discharge Date: 06/28/24                                     Social Drivers of Health (SDOH) Interventions SDOH Screenings   Food Insecurity: No Food Insecurity (06/18/2024)  Housing: Low Risk  (06/18/2024)  Transportation Needs: No Transportation Needs (06/18/2024)  Utilities: Not At Risk (06/18/2024)  Depression (PHQ2-9): High Risk (03/09/2023)  Social Connections: Unknown (06/18/2024)  Tobacco Use: Low Risk  (06/18/2024)    Readmission Risk Interventions    06/26/2024    3:45 PM  Readmission Risk Prevention Plan  Transportation Screening Complete  PCP or Specialist Appt within 5-7 Days Complete  Home Care Screening Complete  Medication Review (RN CM) Complete

## 2024-07-10 NOTE — Plan of Care (Signed)
   Problem: Education: Goal: Knowledge of General Education information will improve Description: Including pain rating scale, medication(s)/side effects and non-pharmacologic comfort measures Outcome: Not Progressing

## 2024-07-10 NOTE — Progress Notes (Signed)
 [id number] Patient refused all meals & drinks offered to her last night.  Wounds cleaned & dressed as prescribed.

## 2024-07-10 NOTE — Progress Notes (Signed)
 [facility] 2W03 [facility]  Hospice hospital liaison note   Received request from [facility] for re-evaluation for [facility]. Reviewed progress notes and went to the bedside to evaluate Ms. Lightle. At this time patient does not meet criteria for hospice inpatient unit.    She is appropriate for hospice services in the home or LTC facility and we would be happy to reassess for inpatient unit appropriateness at a later time if requested.    Thank you for the opportunity to participate in this patient's care.    Greig Basket, BSN, RN Hospice hospital liaison [phone]

## 2024-07-10 NOTE — Progress Notes (Signed)
 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    CT abdomen pelvis showed crescentic abscess at the right hip measuring 12 x 2 x 12 cm, prominent stool favoring constipation, mesenteric edema, severe arthropathy of the hips possible synovitis in the right hip.    Patient received vancomycin , aztreonam  in the ED in the setting of penicillin allergy. Was also diagnosed with right foot abscess and osteomyelitis.  Orthopedic and general surgery consulted, and did not feel there is indication for I&D and recommended wound care and palliative care consult.  After meeting with palliative care, family decided to continue current scope of care with antibiotics.  ID consulted on 7/26, and escalated antibiotics to daptomycin , ceftriaxone  and Flagyl .   After goal of care discussion with patient's daughter, transition to full comfort care on 7/29.  TOC consulted for residential hospice placement. At first daughter agreeable for residential placement but later on 7/30 when she changed her mind. No safe dispo at this time. She remains comfort care.    Assessment & Plan:  Principal Problem:   SIRS (systemic inflammatory response syndrome) (HCC) Active Problems:   CAD (coronary artery disease)   Hypothyroidism   Hyperlipidemia   Severe late onset Alzheimer's dementia (HCC)   Essential hypertension   Primary open angle glaucoma (POAG) of both eyes    Pressure injury of right hip, unstageable (HCC)   Gangrene of right foot (HCC)   Pressure injury of skin    End-of-life care-transitioned to full comfort care on 7/29 after discussion with patient's daughter at bedside. Comfort pathway with as needed medications-ordered. Continue decubiti ulcer care. TOC, palliative on board. Daughter unable to provide care at home.  Per Hospice liaison - appropriate for hospice at home or LTC.   Severe sepsis due to infected decubitus ulcers, right hip abscess and right foot abscess and osteomyelitis:POA Acute septic encephalopathy, POA: MRI of right foot ulceration medial to the first MTP joint with underlying abscess and osteomyelitis of the first metatarsal head, its sesmoids and the base of first proximal phalanx. Blood culture with Staphylococcus capitis in 1 out of 2 bottles likely contaminant. Off all Abx now.  LUE PICC in place for med administration. Transitioned to full comfort care on 7/29. Patient does not have Medicaid and the daughter declined inpatient hospice due to the cost of room and board. Per [facility] - daughter unable to provide care at home.  Right kidney lesion: Favoring a cyst but is indeterminant, no further work up as she is transitioned to comfort only.      Diet:  Diet Orders (From admission, onward)     Start     Ordered   07/04/24 1301  Diet regular Room service appropriate? Yes; Fluid consistency: Thin  Diet effective now       Question Answer Comment  Room service appropriate? Yes   Fluid consistency: Thin      07/04/24 1300           DVT  prophylaxis: she is comfort care.  Level of care: Med-Surg   Code Status: Do not attempt resuscitation (DNR) - Comfort care  Subjective: Patient is seen and examined today morning. She is sleeping comfortably. PICC line out as it is nonfunctional. RN asked IV team evaluation.  Physical Exam: Vitals:   07/09/24 0439 07/09/24 2019 07/09/24 2353 07/10/24 0444  BP: (!)  114/44 128/70 (!) 154/124 138/86  Pulse: 68  87 83  Resp:      Temp: 98 F (36.7 C) 97.8 F (36.6 C) 98.2 F (36.8 C) 98.3 F (36.8 C)  TempSrc:      SpO2: 100% 98% 95% 98%  Weight:      Height:        General - Elderly African American female, lying comfortably HEENT - PERRLA, EOMI, atraumatic head, non tender sinuses. Lung - Clear, basal rales, rhonchi, wheezes. Heart - S1, S2 heard, no murmurs, rubs, 1+ pedal edema. Abdomen - Soft, non tender, bowel sounds good Neuro - alert, awake, unable to follow simple commands. Skin - Warm and dry. Extremity contractures, decubiti b/l hips, heels noted.  Data Reviewed:      Latest Ref Rng & Units 06/26/2024    3:06 AM 06/25/2024    9:02 AM 06/24/2024    2:48 AM  CBC  WBC 4.0 - 10.5 K/uL 5.2  6.5  3.9   Hemoglobin 12.0 - 15.0 g/dL 9.5  89.4  9.7   Hematocrit 36.0 - 46.0 % 29.4  32.6  29.3   Platelets 150 - 400 K/uL 456  498  425       Latest Ref Rng & Units 06/25/2024    9:02 AM 06/24/2024    2:48 AM 06/20/2024    6:04 AM  BMP  Glucose 70 - 99 mg/dL 79  86  874   BUN 8 - 23 mg/dL 10  15  12    Creatinine 0.44 - 1.00 mg/dL 9.44  9.46  9.52   Sodium 135 - 145 mmol/L 139  139  140   Potassium 3.5 - 5.1 mmol/L 3.7  2.9  3.7   Chloride 98 - 111 mmol/L 107  105  106   CO2 22 - 32 mmol/L 22  24  23    Calcium  8.9 - 10.3 mg/dL 7.9  7.9  8.3    DG CHEST PORT 1 VIEW Result Date: 07/09/2024 CLINICAL DATA:  PICC in place. EXAM: PORTABLE CHEST 1 VIEW COMPARISON:  06/02/2021 FINDINGS: Left upper extremity PICC tip projects over the brachiocephalic vein. No pneumothorax. Skin folds project over the left and right hemithorax. The heart is normal in size. Aortic atherosclerosis. No focal airspace disease or pleural effusion. On limited assessment, no acute osseous findings. IMPRESSION: Left upper extremity PICC tip projects over the brachiocephalic vein. No pneumothorax. Electronically Signed   By: Andrea Gasman M.D.   On: 07/09/2024 16:41    Disposition: Status is: Inpatient Remains inpatient appropriate because: comfort care.  Planned Discharge Destination: Home with Hospice pending family discussion     Time spent: 37 minutes  Author: Concepcion Riser, MD 07/10/2024 2:27 PM Secure chat 7am to 7pm For on call review [email or web address].

## 2024-07-10 NOTE — TOC Progression Note (Addendum)
 Transition of Care [name]) - Progression Note    Patient Details  Name: Sabrina Hart MRN: [id number] Date of Birth: 26-Nov-1932  [facility]) CM/SW Contact  Rosaline JONELLE Joe, RN Phone Number: 07/10/2024, 10:46 AM  Clinical Narrative:    CM spoke with Amy, CM with [facility] and patient does not qualify for Inpatient hospice placement.  I called and left a detailed message with the patient's daughter by phone.  CM and NP with Palliative Care spoke with the patient's daughter at the bedside yesterday and she states that she is unable to provide care for the patient in the home with home hospice.  Patient is scheduled for IV Valium  and is waiting on PIV placement since PICC line was discontinued yesterday.  Patient's daughter is unable to provide care for the patient to return home with home hospice.    I spoke with [facility] CM and they will continue to follow the patient until patient is able to qualify for GIP status for Inpatient hospice placement.                     Expected Discharge Plan and Services         Expected Discharge Date: 06/28/24                                     Social Drivers of Health (SDOH) Interventions SDOH Screenings   Food Insecurity: No Food Insecurity (06/18/2024)  Housing: Low Risk  (06/18/2024)  Transportation Needs: No Transportation Needs (06/18/2024)  Utilities: Not At Risk (06/18/2024)  Depression (PHQ2-9): High Risk (03/09/2023)  Social Connections: Unknown (06/18/2024)  Tobacco Use: Low Risk  (06/18/2024)    Readmission Risk Interventions    06/26/2024    3:45 PM  Readmission Risk Prevention Plan  Transportation Screening Complete  PCP or Specialist Appt within 5-7 Days Complete  Home Care Screening Complete  Medication Review (RN CM) Complete

## 2024-07-10 NOTE — Plan of Care (Signed)
  Problem: Education: Goal: Knowledge of General Education information will improve Description: Including pain rating scale, medication(s)/side effects and non-pharmacologic comfort measures Outcome: Progressing   Problem: Health Behavior/Discharge Planning: Goal: Ability to manage health-related needs will improve Outcome: Progressing   Problem: Pain Managment: Goal: General experience of comfort will improve and/or be controlled Outcome: Progressing

## 2024-07-11 DIAGNOSIS — L89210 Pressure ulcer of right hip, unstageable: Secondary | ICD-10-CM | POA: Diagnosis not present

## 2024-07-11 DIAGNOSIS — E039 Hypothyroidism, unspecified: Secondary | ICD-10-CM | POA: Diagnosis not present

## 2024-07-11 DIAGNOSIS — I96 Gangrene, not elsewhere classified: Secondary | ICD-10-CM | POA: Diagnosis not present

## 2024-07-11 DIAGNOSIS — R6510 Systemic inflammatory response syndrome (SIRS) of non-infectious origin without acute organ dysfunction: Secondary | ICD-10-CM | POA: Diagnosis not present

## 2024-07-11 NOTE — TOC Transition Note (Addendum)
 Transition of Care [facility]) - Discharge Note   Patient Details  Name: Sabrina Hart MRN: [id number] Date of Birth: 01-19-1932  Transition of Care [facility]) CM/SW Contact:  Rosaline JONELLE Joe, RN Phone Number: 07/11/2024, 3:20 PM   Clinical Narrative:    CM spoke with [facility] and patient was accepted for Inpatient placement.  The patient's daughter is signing consents and patient will be arranged for transport to the facility today by [facility] once consents are signed.  The patient's family member, Thersia is at the bedside and plans to transport the patient's wheelchair and personal belongings home.  She states that she will update the patient's daughter, Bisi by phone.  07/11/24 1541 - Consents for admission to [facility] have been signed by the daughter.  [facility] was arranged for transport to the facility.  Bedside nursing was asked to call report to [phone].         Patient Goals and CMS Choice            Discharge Placement                       Discharge Plan and Services Additional resources added to the After Visit Summary for                                       Social Drivers of Health (SDOH) Interventions SDOH Screenings   Food Insecurity: No Food Insecurity (06/18/2024)  Housing: Low Risk  (06/18/2024)  Transportation Needs: No Transportation Needs (06/18/2024)  Utilities: Not At Risk (06/18/2024)  Depression (PHQ2-9): High Risk (03/09/2023)  Social Connections: Unknown (06/18/2024)  Tobacco Use: Low Risk  (06/18/2024)     Readmission Risk Interventions    06/26/2024    3:45 PM  Readmission Risk Prevention Plan  Transportation Screening Complete  PCP or Specialist Appt within 5-7 Days Complete  Home Care Screening Complete  Medication Review (RN CM) Complete

## 2024-07-11 NOTE — Plan of Care (Signed)
  Problem: Education: Goal: Knowledge of General Education information will improve Description: Including pain rating scale, medication(s)/side effects and non-pharmacologic comfort measures Outcome: Progressing   Problem: Health Behavior/Discharge Planning: Goal: Ability to manage health-related needs will improve Outcome: Progressing   Problem: Pain Managment: Goal: General experience of comfort will improve and/or be controlled Outcome: Progressing

## 2024-07-11 NOTE — Progress Notes (Signed)
 0550 All wounds cleaned & dressed as prescribed.    9341  Pt refused all food/drink offered to her.

## 2024-07-11 NOTE — Progress Notes (Signed)
 [facility] 2W03 [facility]  Hospice hospital liaison note   [facility] is able to accept patient this afternoon. Consents are complete.    RN staff, you may call report at any time to [phone] room is assigned when report is called.  Please leave IV intact and send completed DNR with patient.   Updated attending and [facility] manager via [employer]. Thank you for the opportunity to participate in this patient's care   Greig Basket, BSN, RN Hospice nurse liaison [phone]

## 2024-07-11 NOTE — Discharge Summary (Signed)
 Physician Discharge Summary   Patient: Sabrina Hart MRN: [id number] DOB: 07/01/1932  Admit date:     06/18/2024  Discharge date: 07/11/24  Discharge Physician: Concepcion Riser   PCP: [name], MD   Recommendations at discharge:    Discharge to hospice care facility.  Discharge Diagnoses: Principal Problem:   SIRS (systemic inflammatory response syndrome) (HCC) Active Problems:   CAD (coronary artery disease)   Hypothyroidism   Hyperlipidemia   Severe late onset Alzheimer's dementia (HCC)   Essential hypertension   Primary open angle glaucoma (POAG) of both eyes   Pressure injury of right hip, unstageable (HCC)   Gangrene of right foot (HCC)   Pressure injury of skin  Resolved Problems:   * No resolved hospital problems. *  Hospital Course: Brief Narrative:  89 year old F with PMH of dementia, CAD/stent, HTN, HLD, hypothyroidism, glaucoma, DVT on Eliquis  and chronic bilateral gluteal decubitus and right foot ulcer presenting with increased CPH/bloody drainage after starting Eliquis  for DVT, decline in mentation and activity 4 weeks, and admitted with severe sepsis due to infected decubitus ulcer, right hip abscess and osteomyelitis of first metatarsal head of the right foot.    CT abdomen pelvis showed crescentic abscess at the right hip measuring 12 x 2 x 12 cm, prominent stool favoring constipation, mesenteric edema, severe arthropathy of the hips possible synovitis in the right hip.    Patient received vancomycin , aztreonam  in the ED in the setting of penicillin allergy. Was also diagnosed with right foot abscess and osteomyelitis.  Orthopedic and general surgery consulted, and did not feel there is indication for I&D and recommended wound care and palliative care consult.  After meeting with palliative care, family decided to continue current scope of care with antibiotics.  ID consulted on 7/26, and escalated antibiotics to daptomycin , ceftriaxone  and Flagyl .  After goals of care discussion with patient's daughter, transitioned to full comfort care on 7/29.   TOC consulted for residential hospice placement. Initially daughter agreeable for residential placement but later on 7/30  declined hospice due to the cost of room and board. She remains under comfort care. On 8/13 - Daughter is in agreement to transfer her mother to [facility].   Assessment & Plan:  Principal Problem:   SIRS (systemic inflammatory response syndrome) (HCC) Active Problems:   CAD (coronary artery disease)   Hypothyroidism   Hyperlipidemia   Severe late onset Alzheimer's dementia (HCC)   Essential hypertension   Primary open angle glaucoma (POAG) of both eyes   Pressure injury of right hip, unstageable (HCC)   Gangrene of right foot (HCC)   Pressure injury of skin   End-of-life care-transitioned to full comfort care on 7/29 after discussion with patient's daughter at bedside. Comfort pathway with as needed medications-ordered. Continue decubiti ulcer care. TOC, palliative on board. Daughter unable to provide care at home.  Per Hospice liaison - appropriate for hospice at home or LTC. Daughter is in agreement to transfer her mother to [facility].   Severe sepsis due to infected decubitus ulcers, right hip abscess and right foot abscess and osteomyelitis:POA Acute septic encephalopathy, POA: MRI of right foot ulceration medial to the first MTP joint with underlying abscess and osteomyelitis of the first metatarsal head, its sesmoids and the base of first proximal phalanx. Blood culture with Staphylococcus capitis in 1 out of 2 bottles likely contaminant. Off all Abx now.  LUE PICC in place for med administration. Transitioned to full comfort care on 7/29. Patient does  not have Medicaid and the daughter declined inpatient hospice due to the cost of room and board. Per [facility] - daughter unable to provide care at home. Daughter is in agreement to transfer her Mom to [facility].   Other medical issues Hypertension normotensive off home antihypertensive meds. AKI: Likely ATN due to severe sepsis.  Resolved. Hypokalemia Mild hyponatremia: Resolved. Normocytic anemia: Stable. Hyperlipidemia CAD: Status post stents Hypothyroidism-Discontinue home medications. H/o DVT - no anticoagulation Right kidney lesion: Favoring a cyst but is indeterminant, no further work up as she is transitioned to comfort only.       Consultants: ortho, gen surgery, ID, Palliative Procedures performed: none  Disposition: Hospice care Diet recommendation:  Regular diet DISCHARGE MEDICATION: Allergies as of 07/11/2024       Reactions   Other Other (See Comments)   Metals; skin gets weepy; gets worse if it makes contact (09/28/2012)   Pollen Extract Other (See Comments)   sneezing; watery eyes   Latex Itching, Swelling, Other (See Comments)   Skin swells   Morphine And Codeine Rash, Other (See Comments)   Patient does not prefer to take these, but if absolutely necessary, accommodations can be made.   Nickel Other (See Comments)   Causes weepiness (skin)   Penicillins Rash   Tolerates ceftriaxone  and cefadroxil         Medication List     STOP taking these medications    amLODipine  5 MG tablet Commonly known as: NORVASC    apixaban  5 MG Tabs tablet Commonly known as: Eliquis    levothyroxine  88 MCG tablet Commonly known as: SYNTHROID    magnesium oxide 400 (240 Mg) MG tablet Commonly known as: MAG-OX   PROCare Adult Briefs X-Large Misc   risperiDONE  0.5 MG tablet Commonly known as: RISPERDAL    sertraline  25 MG tablet Commonly known as: ZOLOFT    Simbrinza 1-0.2 % Susp Generic drug: Brinzolamide-Brimonidine               Discharge Care Instructions  (From admission, onward)           Start     Ordered   07/11/24 0000  Discharge wound care:       Comments: Cleanse B hip wounds with VAshe wound cleanser ([place] [phone]) do not rinse  and allow to air dry.  Apply 1/4 thick layer of Santyl  to wound bed, top with saline moist gauze, dry gauze, and ABD pad and tape.    Apply silicone foam to R knee Stage 1, lift to assess daily.  Change foam q3 days and prn soiling.   07/11/24 1421            Discharge Exam: Filed Weights   06/18/24 0936 06/18/24 1938  Weight: 52.2 kg 49.6 kg   General - Elderly African American female, sleeping comfortably HEENT - PERRLA, EOMI, atraumatic head, non tender sinuses. Lung - Clear, basal rales, rhonchi, wheezes. Heart - S1, S2 heard, no murmurs, rubs, 1+ pedal edema. Abdomen - Soft, non tender, bowel sounds good Neuro - sleeping, unable to follow simple commands. Skin - Warm and dry. Extremity contractures, decubiti b/l hips, heels noted.  Condition at discharge: poor  The results of significant diagnostics from this hospitalization (including imaging, microbiology, ancillary and laboratory) are listed below for reference.   Imaging Studies: DG CHEST PORT 1 VIEW Result Date: 07/09/2024 CLINICAL DATA:  PICC in place. EXAM: PORTABLE CHEST 1 VIEW COMPARISON:  06/02/2021 FINDINGS: Left upper extremity PICC tip projects over the brachiocephalic vein. No pneumothorax.  Skin folds project over the left and right hemithorax. The heart is normal in size. Aortic atherosclerosis. No focal airspace disease or pleural effusion. On limited assessment, no acute osseous findings. IMPRESSION: Left upper extremity PICC tip projects over the brachiocephalic vein. No pneumothorax. Electronically Signed   By: Andrea Gasman M.D.   On: 07/09/2024 16:41   US  EKG SITE RITE Result Date: 06/25/2024 If Site Rite image not attached, placement could not be confirmed due to current cardiac rhythm.  VAS US  ABI WITH/WO TBI Result Date: 06/19/2024  LOWER EXTREMITY DOPPLER STUDY Patient Name:  Shirley Decamp  Date of Exam:   06/19/2024 Medical Rec #: [id number]           Accession #:    [id number] Date of Birth:  [birth year]            Patient Gender: F Patient Age:   31 years Exam Location:  [facility] Procedure:      VAS US  ABI WITH/WO TBI Referring Phys: MICHAEL JEFFERY --------------------------------------------------------------------------------  Indications: Ulceration.  Limitations: Today's exam was limited due to patient positioning and patient              unable to lie flat. Performing Technologist: Jimmye Scarce RVT  Examination Guidelines: A complete evaluation includes at minimum, Doppler waveform signals and systolic blood pressure reading at the level of bilateral brachial, anterior tibial, and posterior tibial arteries, when vessel segments are accessible. Bilateral testing is considered an integral part of a complete examination. Photoelectric Plethysmograph (PPG) waveforms and toe systolic pressure readings are included as required and additional duplex testing as needed. Limited examinations for reoccurring indications may be performed as noted.  ABI Findings: +--------+------------------+-----+----------+--------+ Right   Rt Pressure (mmHg)IndexWaveform  Comment  +--------+------------------+-----+----------+--------+ Brachial                                 IV       +--------+------------------+-----+----------+--------+ PTA     80                0.76 monophasic         +--------+------------------+-----+----------+--------+ DP      78                0.74 monophasic         +--------+------------------+-----+----------+--------+ +--------+------------------+-----+--------+------------------+ Left    Lt Pressure (mmHg)IndexWaveformComment            +--------+------------------+-----+--------+------------------+ Brachial105                                               +--------+------------------+-----+--------+------------------+ PTA                                    pt has contracture +--------+------------------+-----+--------+------------------+ DP       106               1.01 biphasic                   +--------+------------------+-----+--------+------------------+ +-------+-----------+-----------+------------+------------+ ABI/TBIToday's ABIToday's TBIPrevious ABIPrevious TBI +-------+-----------+-----------+------------+------------+ Right  0.76                                           +-------+-----------+-----------+------------+------------+  Left   1.01                                           +-------+-----------+-----------+------------+------------+  Summary: Right: Resting right ankle-brachial index indicates moderate right lower extremity arterial disease. Left: Resting left ankle-brachial index is within normal range. *See table(s) above for measurements and observations.  Electronically signed by Norman Serve on 06/19/2024 at 5:14:14 PM.    Final    MR FOOT RIGHT W WO CONTRAST Result Date: 06/19/2024 CLINICAL DATA:  Soft tissue infection suspected, foot, xray done EXAM: MRI OF THE RIGHT FOREFOOT WITHOUT AND WITH CONTRAST TECHNIQUE: Multiplanar, multisequence MR imaging of the right forefoot was performed before and after the administration of intravenous contrast. CONTRAST:  4mL GADAVIST  GADOBUTROL  1 MMOL/ML IV SOLN COMPARISON:  None Available. FINDINGS: Technical note: Despite efforts by the technologist and patient, mild-to-moderate motion artifact is present on today's exam and could not be eliminated. This reduces exam sensitivity and specificity. Bones/Joint/Cartilage There is a large area of soft tissue ulceration medial to the 1st metatarsophalangeal joint, further described below. There are underlying marrow changes within the 1st metatarsal head, its sesamoids and the base of the 1st proximal phalanx, highly suspicious for osteomyelitis. Specifically, there is decreased T1 signal, T2 hyperintensity and heterogeneous enhancement following contrast. There is apparent cortical destruction involving the medial head  of the 1st metatarsal T2 hyperintensity extends proximally into the mid shaft of the 1st metatarsal. There is a small effusion of the 1st metatarsophalangeal joint with associated synovial enhancement following contrast. No suspicious marrow signal abnormalities or enhancement elsewhere in the forefoot. Mild midfoot and 2nd metatarsophalangeal joint degenerative changes. Ligaments Intact Lisfranc ligament. The medial collateral ligament the 1st metatarsophalangeal joint is not well visualized. The additional collateral ligaments appear intact. Muscles and Tendons Mild generalized muscular atrophy and edema with low level muscular enhancement medially. No discrete intramuscular fluid collections are identified. The forefoot tendons appear intact without significant tenosynovitis. Soft tissues As above, large area of soft tissue ulceration medial to the 1st metatarsophalangeal joint within underlying heterogeneous, peripherally enhancing fluid collection measuring approximately 2.2 x 2.6 x 1.7 cm, suspicious for an abscess. This abuts the 1st metatarsophalangeal joint and is associated with adjacent synovial enhancement and marrow changes consistent with osteomyelitis, as described above. Generalized subcutaneous edema throughout the forefoot without other focal fluid collection. IMPRESSION: 1. Large area of soft tissue ulceration medial to the 1st metatarsophalangeal joint with underlying abscess and underlying osteomyelitis of the 1st metatarsal head, its sesamoids and base of the 1st proximal phalanx. 2. The abscess abuts the 1st metatarsophalangeal joint and is associated with a small effusion and synovial enhancement, suspicious for septic arthritis. 3. No evidence of osteomyelitis elsewhere in the forefoot. 4. Generalized subcutaneous edema throughout the forefoot without other focal fluid collection. Electronically Signed   By: Elsie Perone M.D.   On: 06/19/2024 08:53   CT ABDOMEN PELVIS W CONTRAST Result  Date: 06/18/2024 CLINICAL DATA:  Bowel obstruction EXAM: CT ABDOMEN AND PELVIS WITH CONTRAST TECHNIQUE: Multidetector CT imaging of the abdomen and pelvis was performed using the standard protocol following bolus administration of intravenous contrast. RADIATION DOSE REDUCTION: This exam was performed according to the departmental dose-optimization program which includes automated exposure control, adjustment of the mA and/or kV according to patient size and/or use of iterative reconstruction technique. CONTRAST:  75mL OMNIPAQUE  IOHEXOL  350 MG/ML SOLN COMPARISON:  None Available. FINDINGS: The patient was imaged in a contracted position laying on her left side, resulting in some shifting structures. 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 Descending thoracic aortic and right coronary artery atherosclerosis. The left lung base is excluded. Hepatobiliary: Unremarkable Pancreas: Unremarkable Spleen: Unremarkable where included Adrenals/Urinary Tract: 2.2 cm homogeneous right kidney lower pole lesion favoring a cyst but with internal density of 44 Hounsfield units on portal venous phase images. This is technically indeterminate for cyst versus mass. In most clinical circumstances, renal protocol MRI or CT with and without contrast would be recommended for follow up of this lesion, although correlation with the patient's overall clinical scenario is recommended. Adrenal glands unremarkable. No hydronephrosis or hydroureter. Urinary bladder unremarkable. Stomach/Bowel: Prominent stool throughout the colon favors constipation. No definite dilated small bowel loops are identified, although bowel loops are relatively indistinct in portions of the abdomen. Vascular/Lymphatic: Atherosclerosis is present, including aortoiliac atherosclerotic disease. Substantial atheromatous vascular plaque at the origin of the celiac trunk and SMA contributing to potentially  high-grade stenosis although both vessels appear to opacify and accordingly are likely not completely occluded. There is additional plaque further distally in the SMA. Reproductive: Calcified uterine fibroids. Other: Mesenteric edema. Difficult to exclude a small amount of ascites. Musculoskeletal: Suspected abscess in the subcutaneous tissues lateral to the right hip measuring about 12.0 by 2.0 by 12.0 cm (volume = 150 cm^3). Subcutaneous edema in the right proximal thigh posteriorly Severe arthropathy of the hips, right greater than left, with volume loss in the femoral heads. Flattening and spurring of the right acetabulum with potential erosions. Possible synovitis in the right hip joint. Degenerative arthropathy of the right elbow which is included in the field of imaging. IMPRESSION: 1. Crescentic abscess in the subcutaneous tissues lateral to the right hip measuring about 12.0 by 2.0 by 12.0 cm (volume = 150 cc). 2. Prominent stool throughout the colon favors constipation. 3. Mesenteric edema. Difficult to exclude a small amount of ascites. 4. Severe arthropathy of the hips, right greater than left, with volume loss in the femoral heads. Flattening and spurring of the right acetabulum with potential erosions. Possible synovitis in the right hip joint. 5. 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 6. 2.2 cm homogeneous right kidney lower pole lesion favoring a cyst but with internal density of 44 Hounsfield units on portal venous phase images. This is technically indeterminate for cyst versus mass. In most clinical circumstances, renal protocol MRI or CT with and without contrast would be recommended for follow up of this lesion, although correlation with the patient's overall clinical scenario is recommended. 7. Calcified uterine fibroids. 8. Aortic Atherosclerosis (ICD10-I70.0). Notable atherosclerosis proximally in the celiac trunk and SMA with substantial  stenosis although no overt occlusion. 9. Reduced diagnostic sensitivity and specificity related to patient's contracted state and left side down lateral decubitus positioning during imaging. Electronically Signed   By: Ryan Salvage M.D.   On: 06/18/2024 13:07    Microbiology: Results for orders placed or performed during the hospital encounter of 06/18/24  Urine Culture     Status: None   Collection Time: 06/18/24  1:21 AM   Specimen: Urine, Random  Result Value Ref Range Status   Specimen Description URINE, RANDOM  Final   Special Requests URINE, CLEAN CATCH  Final   Culture   Final    NO GROWTH Performed at [facility], [address] [place], [place] [address]    Report Status 06/20/2024 FINAL  Final  Culture, blood (routine x 2)     Status: None   Collection Time: 06/18/24 10:30 AM   Specimen: BLOOD RIGHT ARM  Result Value Ref Range Status   Specimen Description BLOOD RIGHT ARM  Final   Special Requests   Final    BOTTLES DRAWN AEROBIC AND ANAEROBIC Blood Culture adequate volume   Culture   Final    NO GROWTH 7 DAYS Performed at [facility], [address] [place], [place] [address]    Report Status 06/25/2024 FINAL  Final  Culture, blood (routine x 2)     Status: Abnormal   Collection Time: 06/18/24 10:35 AM   Specimen: BLOOD LEFT ARM  Result Value Ref Range Status   Specimen Description BLOOD LEFT ARM  Final   Special Requests   Final    BOTTLES DRAWN AEROBIC AND ANAEROBIC Blood Culture adequate volume   Culture  Setup Time   Final    GRAM POSITIVE COCCI IN CLUSTERS ANAEROBIC BOTTLE ONLY CRITICAL RESULT CALLED TO, READ BACK BY AND VERIFIED WITH: PHARMD EMILY SINCLAIR ON 06/19/24 @ 1516 BY DRT    Culture (A)  Final    STAPHYLOCOCCUS CAPITIS THE SIGNIFICANCE OF ISOLATING THIS ORGANISM FROM A SINGLE SET OF BLOOD CULTURES WHEN MULTIPLE SETS ARE DRAWN IS UNCERTAIN. PLEASE NOTIFY THE MICROBIOLOGY DEPARTMENT WITHIN ONE WEEK IF SPECIATION AND SENSITIVITIES  ARE REQUIRED. Performed at [facility], [address] [place], [place] [address]    Report Status 06/21/2024 FINAL  Final  Blood Culture ID Panel (Reflexed)     Status: Abnormal   Collection Time: 06/18/24 10:35 AM  Result Value Ref Range Status   Enterococcus faecalis NOT DETECTED NOT DETECTED Final   Enterococcus Faecium NOT DETECTED NOT DETECTED Final   Listeria monocytogenes NOT DETECTED NOT DETECTED Final   Staphylococcus species DETECTED (A) NOT DETECTED Final    Comment: CRITICAL RESULT CALLED TO, READ BACK BY AND VERIFIED WITH: PHARMD EMILY SINCLAIR ON 06/19/24 @ 1516 BY DRT    Staphylococcus aureus (BCID) NOT DETECTED NOT DETECTED Final   Staphylococcus epidermidis NOT DETECTED NOT DETECTED Final   Staphylococcus lugdunensis NOT DETECTED NOT DETECTED Final   Streptococcus species NOT DETECTED NOT DETECTED Final   Streptococcus agalactiae NOT DETECTED NOT DETECTED Final   Streptococcus pneumoniae NOT DETECTED NOT DETECTED Final   Streptococcus pyogenes NOT DETECTED NOT DETECTED Final   A.calcoaceticus-baumannii NOT DETECTED NOT DETECTED Final   Bacteroides fragilis NOT DETECTED NOT DETECTED Final   Enterobacterales NOT DETECTED NOT DETECTED Final   Enterobacter cloacae complex NOT DETECTED NOT DETECTED Final   Escherichia coli NOT DETECTED NOT DETECTED Final   Klebsiella aerogenes NOT DETECTED NOT DETECTED Final   Klebsiella oxytoca NOT DETECTED NOT DETECTED Final   Klebsiella pneumoniae NOT DETECTED NOT DETECTED Final   Proteus species NOT DETECTED NOT DETECTED Final   Salmonella species NOT DETECTED NOT DETECTED Final   Serratia marcescens NOT DETECTED NOT DETECTED Final   Haemophilus influenzae NOT DETECTED NOT DETECTED Final   Neisseria meningitidis NOT DETECTED NOT DETECTED Final   Pseudomonas aeruginosa NOT DETECTED NOT DETECTED Final   Stenotrophomonas maltophilia NOT DETECTED NOT DETECTED Final   Candida albicans NOT DETECTED NOT DETECTED Final   Candida  auris NOT DETECTED NOT DETECTED Final   Candida glabrata NOT DETECTED NOT DETECTED Final   Candida krusei NOT DETECTED NOT DETECTED Final   Candida parapsilosis NOT  DETECTED NOT DETECTED Final   Candida tropicalis NOT DETECTED NOT DETECTED Final   Cryptococcus neoformans/gattii NOT DETECTED NOT DETECTED Final    Comment: Performed at [facility], [address] [place], [place] [address]  MRSA Next Gen by PCR, Nasal     Status: None   Collection Time: 06/18/24  8:47 PM   Specimen: Nasal Mucosa; Nasal Swab  Result Value Ref Range Status   MRSA by PCR Next Gen NOT DETECTED NOT DETECTED Final    Comment: (NOTE) The GeneXpert MRSA Assay (FDA approved for NASAL specimens only), is one component of a comprehensive MRSA colonization surveillance program. It is not intended to diagnose MRSA infection nor to guide or monitor treatment for MRSA infections. Test performance is not FDA approved in patients less than 62 years old. Performed at [facility], [address], [place], [place] [address]     Labs: CBC: No results for input(s): WBC, NEUTROABS, HGB, HCT, MCV, PLT in the last 168 hours. Basic Metabolic Panel: No results for input(s): NA, K, CL, CO2, GLUCOSE, BUN, CREATININE, CALCIUM , MG, PHOS in the last 168 hours. Liver Function Tests: No results for input(s): AST, ALT, ALKPHOS, BILITOT, PROT, ALBUMIN in the last 168 hours. CBG: No results for input(s): GLUCAP in the last 168 hours.  Discharge time spent: 37 minutes.  Signed: Concepcion Riser, MD [facility] 07/11/2024

## 2024-07-11 NOTE — Progress Notes (Addendum)
   Palliative Medicine Inpatient Follow Up Note HPI: 89 y.o. female  with past medical history of hypertension, hyperlipidemia, hypothyroidism, CAD status post stent, dementia, glaucoma, DVT on Eliquis  presented with confusion, wound changes, constipation.  She was admitted on 06/18/2024 with SIRS, severe sepsis secondary to infected decubitus ulcers, right hip abscess, osteomyelitis of the first metatarsal head of the right foot, acute septic encephalopathy, constipation, AKI, and others.    Palliative medicine was consulted for GOC conversations.  Today's Discussion 07/11/2024  *Please note that this is a verbal dictation therefore any spelling or grammatical errors are due to the Dragon [employer] One system interpretation.  Chart reviewed inclusive of vital signs, progress notes, laboratory results, and diagnostic images.  Sabrina Hart has been on diazepam  ATC. Required x1 dose of fentanyl  IVP this morning.   Nursing staff vocalize not concerns early this morning.   On assessment, Sabrina Hart is resting comfortably in NAD. She arouses when spoken to. She is no longer taking in food or fluid.  Patient has been accepted to [facility] for ongoing end of life care.   Questions addresses.  Objective Assessment: Vital Signs Vitals:   07/11/24 0014 07/11/24 0444  BP: (!) 143/62 (!) 119/98  Pulse: 77 (!) 105  Resp:    Temp: 98.3 F (36.8 C) 98.1 F (36.7 C)  SpO2: 98% 96%   No intake or output data in the 24 hours ending 07/11/24 1257  Last Weight  Most recent update: 06/18/2024  7:39 PM    Weight  49.6 kg (109 lb 5.6 oz)            Gen: Frail elderly African-American female chronically ill in appearance HEENT: Dry mucous membranes CV: Regular rate and rhythm  PULM: On room air breathing is even and nonlabored ABD: soft/nontender  EXT: Thin extremities Neuro: Somnolent  SUMMARY OF RECOMMENDATIONS   DNR-comfort Continue comfort care See symptom management orders below Will transition  to [employer] today   Symptom Management:  Tylenol  650 mg PR every 6 hours as needed mild pain (1-3) or fever Artificial tears 1 drop OU 4 times daily as needed dry eyes Fentanyl  25 mcg IV every hour as needed severe pain (7-10) Robinul  0.2 mg IV every 4 hours as needed extend secretions Haldol  0.5 mg IV every 4 hours as needed agitation or delirium Diazepam  2.5mg  IV Q6H ATC Zofran  4 mg IV every 6 hours as needed nausea or vomiting ______________________________________________________________________________________ Sabrina Hart Palliative Medicine Team Team Cell Phone: [phone] Please utilize secure chat with additional questions, if there is no response within 30 minutes please call the above phone number  MDM high in the setting of review and modification of controlled substance orders. Valium  is on national shortage therefore was transitioned to diazepam  to more appropriately control symptoms.   Palliative Medicine Team providers are available by phone from 7am to 7pm daily and can be reached through the team cell phone.  Should this patient require assistance outside of these hours, please call the patient's attending physician.

## 2024-07-11 NOTE — Plan of Care (Signed)
  Problem: Education: Goal: Knowledge of General Education information will improve Description: Including pain rating scale, medication(s)/side effects and non-pharmacologic comfort measures Outcome: Adequate for Discharge   

## 2024-07-16 ENCOUNTER — Telehealth: Payer: Self-pay | Admitting: Family Medicine

## 2024-07-16 NOTE — Telephone Encounter (Signed)
 This provider contacted pt's daughter, Bisi to check on them.  Pt at [facility].  She is comfortable.  No longer responsive, but able to hear.  Listening to gospel music and classical music.  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  Pt was excited to make it to her 89th birthday on 7[birth year].    Clotilda Single, MD

## 2024-07-19 ENCOUNTER — Ambulatory Visit: Admitting: Infectious Diseases
